# Patient Record
Sex: Male | Born: 1955 | Race: White | Hispanic: No | Marital: Single | Smoking: Never smoker
Health system: Southern US, Community
[De-identification: ages and names within clinical notes are randomized; demographics above are authoritative.]

## PROBLEM LIST (undated history)

## (undated) DIAGNOSIS — E119 Type 2 diabetes mellitus without complications: Secondary | ICD-10-CM

## (undated) DIAGNOSIS — Z87442 Personal history of urinary calculi: Secondary | ICD-10-CM

## (undated) DIAGNOSIS — M549 Dorsalgia, unspecified: Secondary | ICD-10-CM

## (undated) DIAGNOSIS — I10 Essential (primary) hypertension: Secondary | ICD-10-CM

## (undated) DIAGNOSIS — M1990 Unspecified osteoarthritis, unspecified site: Secondary | ICD-10-CM

## (undated) HISTORY — PX: SHOULDER ARTHROSCOPY: SHX128

## (undated) HISTORY — PX: BACK SURGERY: SHX140

## (undated) HISTORY — PX: JOINT REPLACEMENT: SHX530

## (undated) HISTORY — PX: CHOLECYSTECTOMY: SHX55

## (undated) HISTORY — PX: CARPAL TUNNEL RELEASE: SHX101

## (undated) HISTORY — PX: FOOT SURGERY: SHX648

## (undated) HISTORY — PX: TOTAL KNEE ARTHROPLASTY: SHX125

---

## 2006-03-12 DIAGNOSIS — I10 Essential (primary) hypertension: Secondary | ICD-10-CM

## 2006-03-12 DIAGNOSIS — E1165 Type 2 diabetes mellitus with hyperglycemia: Secondary | ICD-10-CM

## 2007-10-27 DIAGNOSIS — E781 Pure hyperglyceridemia: Secondary | ICD-10-CM

## 2008-10-27 DIAGNOSIS — E669 Obesity, unspecified: Secondary | ICD-10-CM | POA: Insufficient documentation

## 2008-11-08 DIAGNOSIS — K7581 Nonalcoholic steatohepatitis (NASH): Secondary | ICD-10-CM

## 2010-02-14 DIAGNOSIS — G8929 Other chronic pain: Secondary | ICD-10-CM | POA: Insufficient documentation

## 2010-04-30 DIAGNOSIS — M109 Gout, unspecified: Secondary | ICD-10-CM | POA: Insufficient documentation

## 2011-02-28 DIAGNOSIS — M5416 Radiculopathy, lumbar region: Secondary | ICD-10-CM | POA: Insufficient documentation

## 2012-01-23 DIAGNOSIS — N200 Calculus of kidney: Secondary | ICD-10-CM | POA: Insufficient documentation

## 2013-01-15 DIAGNOSIS — M009 Pyogenic arthritis, unspecified: Secondary | ICD-10-CM | POA: Insufficient documentation

## 2013-01-22 DIAGNOSIS — M009 Pyogenic arthritis, unspecified: Secondary | ICD-10-CM | POA: Insufficient documentation

## 2013-02-01 DIAGNOSIS — M869 Osteomyelitis, unspecified: Secondary | ICD-10-CM | POA: Insufficient documentation

## 2013-02-07 DIAGNOSIS — M86129 Other acute osteomyelitis, unspecified humerus: Secondary | ICD-10-CM | POA: Insufficient documentation

## 2013-04-22 DIAGNOSIS — K5790 Diverticulosis of intestine, part unspecified, without perforation or abscess without bleeding: Secondary | ICD-10-CM | POA: Insufficient documentation

## 2013-07-05 DIAGNOSIS — IMO0002 Reserved for concepts with insufficient information to code with codable children: Secondary | ICD-10-CM | POA: Insufficient documentation

## 2013-07-05 DIAGNOSIS — L600 Ingrowing nail: Secondary | ICD-10-CM | POA: Insufficient documentation

## 2013-07-16 DIAGNOSIS — G959 Disease of spinal cord, unspecified: Secondary | ICD-10-CM | POA: Insufficient documentation

## 2013-10-18 ENCOUNTER — Emergency Department (HOSPITAL_COMMUNITY)
Admission: EM | Admit: 2013-10-18 | Discharge: 2013-10-18 | Disposition: A | Discharge status: Home / Self Care | Payer: Medicaid - Out of State | Attending: Emergency Medicine | Admitting: Emergency Medicine

## 2013-10-18 ENCOUNTER — Encounter (HOSPITAL_COMMUNITY): Payer: Self-pay | Admitting: Emergency Medicine

## 2013-10-18 DIAGNOSIS — Z96659 Presence of unspecified artificial knee joint: Secondary | ICD-10-CM | POA: Diagnosis not present

## 2013-10-18 DIAGNOSIS — E119 Type 2 diabetes mellitus without complications: Secondary | ICD-10-CM | POA: Diagnosis not present

## 2013-10-18 DIAGNOSIS — Z79899 Other long term (current) drug therapy: Secondary | ICD-10-CM | POA: Insufficient documentation

## 2013-10-18 DIAGNOSIS — I10 Essential (primary) hypertension: Secondary | ICD-10-CM | POA: Diagnosis not present

## 2013-10-18 DIAGNOSIS — Z791 Long term (current) use of non-steroidal anti-inflammatories (NSAID): Secondary | ICD-10-CM | POA: Diagnosis not present

## 2013-10-18 DIAGNOSIS — L739 Follicular disorder, unspecified: Secondary | ICD-10-CM | POA: Diagnosis not present

## 2013-10-18 DIAGNOSIS — Z792 Long term (current) use of antibiotics: Secondary | ICD-10-CM | POA: Insufficient documentation

## 2013-10-18 DIAGNOSIS — Z8614 Personal history of Methicillin resistant Staphylococcus aureus infection: Secondary | ICD-10-CM | POA: Diagnosis not present

## 2013-10-18 DIAGNOSIS — Z794 Long term (current) use of insulin: Secondary | ICD-10-CM | POA: Diagnosis not present

## 2013-10-18 DIAGNOSIS — M79605 Pain in left leg: Secondary | ICD-10-CM | POA: Insufficient documentation

## 2013-10-18 DIAGNOSIS — M79604 Pain in right leg: Secondary | ICD-10-CM | POA: Diagnosis present

## 2013-10-18 HISTORY — DX: Type 2 diabetes mellitus without complications: E11.9

## 2013-10-18 HISTORY — DX: Essential (primary) hypertension: I10

## 2013-10-18 MED ORDER — OXYCODONE-ACETAMINOPHEN 5-325 MG PO TABS: 1.0000 | ORAL_TABLET | ORAL | Status: DC | PRN

## 2013-10-18 MED ORDER — NAPROXEN 500 MG PO TABS: 500.0000 mg | ORAL_TABLET | Freq: Two times a day (BID) | ORAL | Status: DC

## 2013-10-18 MED ORDER — CLINDAMYCIN HCL 150 MG PO CAPS
300.0000 mg | ORAL_CAPSULE | Freq: Once | ORAL | Status: AC
  Administered 2013-10-18: 300 mg via ORAL
  Filled 2013-10-18: qty 2

## 2013-10-18 MED ORDER — CLINDAMYCIN HCL 300 MG PO CAPS: 300.0000 mg | ORAL_CAPSULE | Freq: Four times a day (QID) | ORAL | Status: DC

## 2013-10-18 MED ORDER — OXYCODONE-ACETAMINOPHEN 5-325 MG PO TABS
2.0000 | ORAL_TABLET | Freq: Once | ORAL | Status: AC
  Administered 2013-10-18: 2 via ORAL

## 2013-10-18 NOTE — Discharge Instructions (Signed)
Folliculitis  Folliculitis is redness, soreness, and swelling (inflammation) of the hair follicles. This condition can occur anywhere on the body. People with weakened immune systems, diabetes, or obesity have a greater risk of getting folliculitis. CAUSES  Bacterial infection. This is the most common cause.  Fungal infection.  Viral infection.  Contact with certain chemicals, especially oils and tars. Long-term folliculitis can result from bacteria that live in the nostrils. The bacteria may trigger multiple outbreaks of folliculitis over time. SYMPTOMS Folliculitis most commonly occurs on the scalp, thighs, legs, back, buttocks, and areas where hair is shaved frequently. An early sign of folliculitis is a small, white or yellow, pus-filled, itchy lesion (pustule). These lesions appear on a red, inflamed follicle. They are usually less than 0.2 inches (5 mm) wide. When there is an infection of the follicle that goes deeper, it becomes a boil or furuncle. A group of closely packed boils creates a larger lesion (carbuncle). Carbuncles tend to occur in hairy, sweaty areas of the body. DIAGNOSIS  Your caregiver can usually tell what is wrong by doing a physical exam. A sample may be taken from one of the lesions and tested in a lab. This can help determine what is causing your folliculitis. TREATMENT  Treatment may include:  Applying warm compresses to the affected areas.  Taking antibiotic medicines orally or applying them to the skin.  Draining the lesions if they contain a large amount of pus or fluid.  Laser hair removal for cases of long-lasting folliculitis. This helps to prevent regrowth of the hair. HOME CARE INSTRUCTIONS  Apply warm compresses to the affected areas as directed by your caregiver.  If antibiotics are prescribed, take them as directed. Finish them even if you start to feel better.  You may take over-the-counter medicines to relieve itching.  Do not shave  irritated skin.  Follow up with your caregiver as directed. SEEK IMMEDIATE MEDICAL CARE IF:   You have increasing redness, swelling, or pain in the affected area.  You have a fever. MAKE SURE YOU:  Understand these instructions.  Will watch your condition.  Will get help right away if you are not doing well or get worse. Document Released: 03/11/2001 Document Revised: 07/02/2011 Document Reviewed: 04/02/2011 [facility] Patient Information 2015 [place], [place]. This information is not intended to replace advice given to you by your health care provider. Make sure you discuss any questions you have with your health care provider.

## 2013-10-18 NOTE — ED Provider Notes (Signed)
CSN: [id number]     Arrival date & time 10/18/13  1619 History  This chart was scribed for non-physician practitioner, Evalee Jefferson, PA-C,working with Hoy Morn, MD, by Marlowe Kays, ED Scribe. This patient was seen in room APFT20/APFT20 and the patient's care was started at 5:45 PM.  Chief Complaint  Patient presents with  . Leg Pain   The history is provided by the patient. No language interpreter was used.   HPI Comments:  [name] is a 58 y.o. male with PMHx of DM and HTN who presents to the Emergency Department complaining of multiple red areas on bilateral lower extremities that began within the past two weeks. Pt reports associated moderate pain and drainage from the areas of a whitish-pink color because he reports he has been "digging them open". He reports he began taking Zanaflex and Topamax two weeks ago prescribed by Dr. Stephens Shire. He states he had a subjective fever for one day about one week ago. Touching the areas make the pain worse. He denies any alleviating factors. He denies any recent fever, chills, nausea, vomiting. He states his CBGs have been fine. He denies any new tattoos. He states he has a PCP in [place], [place]. He reports past surgeries of joint replacement, back, knee and foot surgery, cholecystectomy, and carpal tunnel release.  Past Medical History  Diagnosis Date  . Diabetes mellitus without complication   . Hypertension    Past Surgical History  Procedure Laterality Date  . Joint replacement    . Back surgery    . Cholecystectomy    . Carpal tunnel release    . Foot surgery    . Total knee arthroplasty     History reviewed. No pertinent family history. History  Substance Use Topics  . Smoking status: Never Smoker   . Smokeless tobacco: Not on file  . Alcohol Use: No    Review of Systems  Constitutional: Negative for chills.  Respiratory: Negative for shortness of breath and wheezing.   Gastrointestinal: Negative for nausea and  vomiting.  Skin: Positive for rash.  Neurological: Negative for numbness.    Allergies  Toradol and Ultram  Home Medications   Prior to Admission medications   Medication Sig Start Date End Date Taking? Authorizing Provider  Benzocaine (BOIL PAIN RELIEF EX) Apply 1 application topically as needed (for sores on legs).   Yes Historical Provider, MD  hydrochlorothiazide (HYDRODIURIL) 25 MG tablet Take 25 mg by mouth daily. 09/02/13  Yes Historical Provider, MD  LEVEMIR FLEXTOUCH 100 UNIT/ML Pen Inject 80 Units into the skin 2 (two) times daily. 10/08/13  Yes Historical Provider, MD  lisinopril (PRINIVIL,ZESTRIL) 40 MG tablet Take 40 mg by mouth daily. 09/02/13  Yes Historical Provider, MD  metoprolol (LOPRESSOR) 100 MG tablet Take 100 mg by mouth 2 (two) times daily. 09/02/13  Yes Historical Provider, MD  Neomycin-Bacitracin-Polymyxin (TRIPLE ANTIBIOTIC) [phone] OINT Apply 1 application topically as needed (for sores and skin irritation to legs).   Yes Historical Provider, MD  tiZANidine (ZANAFLEX) 4 MG tablet Take 4 mg by mouth 3 (three) times daily. 10/06/13  Yes Historical Provider, MD  topiramate (TOPAMAX) 25 MG capsule Take 25 mg by mouth 2 (two) times daily. 10/06/13  Yes Historical Provider, MD  clindamycin (CLEOCIN) 300 MG capsule Take 1 capsule (300 mg total) by mouth 4 (four) times daily. 10/18/13   Evalee Jefferson, PA-C  HUMULIN R 100 UNIT/ML injection Inject into the skin 3 (three) times daily with meals as needed.  Based on sliding scale 10/15/13   Historical Provider, MD  naproxen (NAPROSYN) 500 MG tablet Take 1 tablet (500 mg total) by mouth 2 (two) times daily with a meal. 10/18/13   Evalee Jefferson, PA-C  oxyCODONE-acetaminophen (PERCOCET/ROXICET) 5-325 MG per tablet Take 1 tablet by mouth every 4 (four) hours as needed. 10/18/13   Evalee Jefferson, PA-C   Triage Vitals: BP 121/74  Pulse 69  Temp(Src) 98.7 F (37.1 C) (Oral)  Resp 18  Ht 6' 3"  (1.905 m)  Wt 210 lb (95.255 kg)  BMI 26.25 kg/m2   SpO2 100% Physical Exam  Nursing note and vitals reviewed. Constitutional: He appears well-developed and well-nourished.  HENT:  Head: Normocephalic and atraumatic.  Eyes: Conjunctivae are normal.  Neck: Normal range of motion.  Cardiovascular: Normal rate, regular rhythm, normal heart sounds and intact distal pulses.   Dorsalis Pedal pulses intact. Cap refill less than 2 seconds.   Pulmonary/Chest: Effort normal and breath sounds normal. He has no wheezes.  Abdominal: Soft. Bowel sounds are normal. There is no tenderness.  Musculoskeletal: Normal range of motion.  Neurological: He is alert.  Normal sensations in feet bilaterally.  Skin: Skin is warm and dry. Rash noted.  Multiple raised papules on lower extremities, right greater than left. Indurated without fluctuance or spontaneous drainage. No red streaking. Largest one measures approximately 1 cm.  Psychiatric: He has a normal mood and affect.    ED Course  Procedures (including critical care time) DIAGNOSTIC STUDIES: Oxygen Saturation is 100% on RA, normal by my interpretation.   COORDINATION OF CARE: 5:58 PM- Will have Dr. Venora Maples evaluate patient. Will prescribe Clindamycin and advised pt to follow up with PCP. Advised pt to apply warm compresses to sites. Pt verbalizes understanding and agrees to plan.  Medications  clindamycin (CLEOCIN) capsule 300 mg (300 mg Oral Given 10/18/13 1817)  oxyCODONE-acetaminophen (PERCOCET/ROXICET) 5-325 MG per tablet 2 tablet (2 tablets Oral Given 10/18/13 1817)    Labs Review Labs Reviewed - No data to display  Imaging Review No results found.   EKG Interpretation None      MDM   Final diagnoses:  Folliculitis    Pt placed on clindamycin and naproxen.  He was given small oxycodone script for the first 24 hours, then naproxen prn for pain.  Warm compresses.  F/u with pcp ([facility] in [place]) if sx worsen or are not improving with tx.  Pt describes severe left shoulder post  surgical infection 12/14 requiring home IV abx, h/o mrsa.  Will cover for mrsa today with clindamycin.  I personally performed the services described in this documentation, which was scribed in my presence. The recorded information has been reviewed and is accurate.    Evalee Jefferson, PA-C 10/20/13 1256

## 2013-10-18 NOTE — ED Notes (Signed)
Pt has mult red areas on lower legs, Pt thinks is due to taking zanaflex.  Areas are "sore" and red

## 2013-10-20 NOTE — ED Provider Notes (Signed)
Medical screening examination/treatment/procedure(s) were performed by non-physician practitioner and as supervising physician I was immediately available for consultation/collaboration.   EKG Interpretation None        Hoy Morn, MD 10/20/13 [id number]

## 2013-10-31 ENCOUNTER — Emergency Department (HOSPITAL_COMMUNITY)
Admission: EM | Admit: 2013-10-31 | Discharge: 2013-10-31 | Disposition: A | Discharge status: Home / Self Care | Payer: Medicaid - Out of State | Attending: Emergency Medicine | Admitting: Emergency Medicine

## 2013-10-31 ENCOUNTER — Encounter (HOSPITAL_COMMUNITY): Payer: Self-pay | Admitting: Emergency Medicine

## 2013-10-31 DIAGNOSIS — Z791 Long term (current) use of non-steroidal anti-inflammatories (NSAID): Secondary | ICD-10-CM | POA: Diagnosis not present

## 2013-10-31 DIAGNOSIS — I10 Essential (primary) hypertension: Secondary | ICD-10-CM | POA: Insufficient documentation

## 2013-10-31 DIAGNOSIS — L02415 Cutaneous abscess of right lower limb: Secondary | ICD-10-CM

## 2013-10-31 DIAGNOSIS — M1990 Unspecified osteoarthritis, unspecified site: Secondary | ICD-10-CM | POA: Insufficient documentation

## 2013-10-31 DIAGNOSIS — L02416 Cutaneous abscess of left lower limb: Secondary | ICD-10-CM | POA: Diagnosis not present

## 2013-10-31 DIAGNOSIS — Z794 Long term (current) use of insulin: Secondary | ICD-10-CM | POA: Insufficient documentation

## 2013-10-31 DIAGNOSIS — E1165 Type 2 diabetes mellitus with hyperglycemia: Secondary | ICD-10-CM

## 2013-10-31 DIAGNOSIS — R197 Diarrhea, unspecified: Secondary | ICD-10-CM | POA: Diagnosis not present

## 2013-10-31 DIAGNOSIS — L03115 Cellulitis of right lower limb: Secondary | ICD-10-CM | POA: Diagnosis present

## 2013-10-31 DIAGNOSIS — Z792 Long term (current) use of antibiotics: Secondary | ICD-10-CM | POA: Insufficient documentation

## 2013-10-31 DIAGNOSIS — E669 Obesity, unspecified: Secondary | ICD-10-CM

## 2013-10-31 DIAGNOSIS — E1169 Type 2 diabetes mellitus with other specified complication: Secondary | ICD-10-CM

## 2013-10-31 HISTORY — DX: Unspecified osteoarthritis, unspecified site: M19.90

## 2013-10-31 LAB — COMPREHENSIVE METABOLIC PANEL
ALT: 18 U/L (ref 0–53)
ANION GAP: 11 (ref 5–15)
AST: 25 U/L (ref 0–37)
Albumin: 3.7 g/dL (ref 3.5–5.2)
Alkaline Phosphatase: 77 U/L (ref 39–117)
BILIRUBIN TOTAL: 0.4 mg/dL (ref 0.3–1.2)
BUN: 16 mg/dL (ref 6–23)
CO2: 26 mEq/L (ref 19–32)
Calcium: 9.3 mg/dL (ref 8.4–10.5)
Chloride: 100 mEq/L (ref 96–112)
Creatinine, Ser: 0.96 mg/dL (ref 0.50–1.35)
GFR calc Af Amer: >90 mL/min (ref >90)
GFR calc non Af Amer: >90 mL/min (ref >90)
GLUCOSE: 300 mg/dL — AB (ref 70–99)
Potassium: 4.8 mEq/L (ref 3.7–5.3)
Sodium: 137 mEq/L (ref 137–147)
Total Protein: 7 g/dL (ref 6.0–8.3)

## 2013-10-31 LAB — CBC WITH DIFFERENTIAL/PLATELET
Basophils Absolute: 0 10*3/uL (ref 0.0–0.1)
Basophils Relative: 0 % (ref 0–1)
Eosinophils Absolute: 0.1 10*3/uL (ref 0.0–0.7)
Eosinophils Relative: 2 % (ref 0–5)
HCT: 34.2 % — ABNORMAL LOW (ref 39.0–52.0)
Hemoglobin: 12.4 g/dL — ABNORMAL LOW (ref 13.0–17.0)
LYMPHS ABS: 2 10*3/uL (ref 0.7–4.0)
LYMPHS PCT: 41 % (ref 12–46)
MCH: 32.4 pg (ref 26.0–34.0)
MCHC: 36.3 g/dL — ABNORMAL HIGH (ref 30.0–36.0)
MCV: 89.3 fL (ref 78.0–100.0)
MONOS PCT: 8 % (ref 3–12)
Monocytes Absolute: 0.4 10*3/uL (ref 0.1–1.0)
NEUTROS PCT: 49 % (ref 43–77)
Neutro Abs: 2.4 10*3/uL (ref 1.7–7.7)
Platelets: 122 10*3/uL — ABNORMAL LOW (ref 150–400)
RBC: 3.83 MIL/uL — AB (ref 4.22–5.81)
RDW: 13.1 % (ref 11.5–15.5)
WBC: 4.8 10*3/uL (ref 4.0–10.5)

## 2013-10-31 LAB — CBG MONITORING, ED: Glucose-Capillary: 329 mg/dL — ABNORMAL HIGH (ref 70–99)

## 2013-10-31 MED ORDER — BACITRACIN-NEOMYCIN-POLYMYXIN 400-5-5000 EX OINT
TOPICAL_OINTMENT | Freq: Once | CUTANEOUS | Status: AC
  Administered 2013-10-31: 1 via TOPICAL
  Filled 2013-10-31: qty 3

## 2013-10-31 MED ORDER — HYDROCODONE-ACETAMINOPHEN 5-325 MG PO TABS
2.0000 | ORAL_TABLET | Freq: Once | ORAL | Status: AC
Start: 2013-10-31 — End: 2013-10-31
  Administered 2013-10-31: 2 via ORAL

## 2013-10-31 MED ORDER — LIDOCAINE HCL (PF) 1 % IJ SOLN
30.0000 mL | Freq: Once | INTRAMUSCULAR | Status: AC
  Administered 2013-10-31: 30 mL via INTRADERMAL
  Filled 2013-10-31: qty 30

## 2013-10-31 MED ORDER — HYDROCODONE-ACETAMINOPHEN 5-325 MG PO TABS
ORAL_TABLET | ORAL | Status: AC
  Filled 2013-10-31: qty 2

## 2013-10-31 MED ORDER — SULFAMETHOXAZOLE-TRIMETHOPRIM 800-160 MG PO TABS: 1.0000 | ORAL_TABLET | Freq: Two times a day (BID) | ORAL | Status: DC

## 2013-10-31 NOTE — ED Notes (Signed)
Multiple abscess noted to both lower extremities all about quarter size.

## 2013-10-31 NOTE — ED Notes (Signed)
MD at bedside. 

## 2013-10-31 NOTE — ED Notes (Signed)
Pt says he is out of medications.  C/o pain to both legs, rates pain 10.

## 2013-10-31 NOTE — ED Provider Notes (Signed)
CSN: [id number]     Arrival date & time 10/31/13  1618 History   This chart was scribed for Darrell Clonts, MD by Molli Posey, ED Scribe. This patient was seen in room APA18/APA18 and the patient's care was started 5:14 PM.    Chief Complaint  Patient presents with  . Cellulitis     The history is provided by the patient. No language interpreter was used.   HPI Comments: [name] is a 58 y.o. male with a history of DM and HTN who presents to the Emergency Department complaining of gradually worsening cellulitis that started 3 weeks ago when he shaved his legs to get a tattoo. Patient reports he was seen in ED a week ago and completed his clindamycin prescription 3 days ago. He reports his symptoms began to improve and then started worsening the past week. He reports associated redness, swelling, subjective fevers, chills and diarrhea. Patient reports he was hospitalized for a staph infection in 2014.     Past Medical History  Diagnosis Date  . Diabetes mellitus without complication   . Hypertension   . Arthritis    Past Surgical History  Procedure Laterality Date  . Joint replacement    . Back surgery    . Cholecystectomy    . Carpal tunnel release    . Foot surgery    . Total knee arthroplasty     No family history on file. History  Substance Use Topics  . Smoking status: Never Smoker   . Smokeless tobacco: Not on file  . Alcohol Use: No    Review of Systems  Constitutional: Positive for fever.  Cardiovascular: Negative for chest pain.  Gastrointestinal: Positive for diarrhea. Negative for abdominal pain.  Skin: Positive for rash.  All other systems reviewed and are negative.    Allergies  Toradol and Ultram  Home Medications   Prior to Admission medications   Medication Sig Start Date End Date Taking? Authorizing Provider  HUMULIN R 100 UNIT/ML injection Inject into the skin 3 (three) times daily with meals as needed. Based on sliding scale 10/15/13   Yes Historical Provider, MD  hydrochlorothiazide (HYDRODIURIL) 25 MG tablet Take 25 mg by mouth daily. 09/02/13  Yes Historical Provider, MD  ibuprofen (ADVIL,MOTRIN) 200 MG tablet Take 200 mg by mouth every 6 (six) hours as needed for mild pain or moderate pain.   Yes Historical Provider, MD  LEVEMIR FLEXTOUCH 100 UNIT/ML Pen Inject 80 Units into the skin 2 (two) times daily. 10/08/13  Yes Historical Provider, MD  lisinopril (PRINIVIL,ZESTRIL) 40 MG tablet Take 40 mg by mouth daily. 09/02/13  Yes Historical Provider, MD  metoprolol (LOPRESSOR) 100 MG tablet Take 100 mg by mouth 2 (two) times daily. 09/02/13  Yes Historical Provider, MD  topiramate (TOPAMAX) 25 MG capsule Take 25 mg by mouth 2 (two) times daily. 10/06/13  Yes Historical Provider, MD  clindamycin (CLEOCIN) 300 MG capsule Take 1 capsule (300 mg total) by mouth 4 (four) times daily. 10/18/13   Evalee Jefferson, PA-C  naproxen (NAPROSYN) 500 MG tablet Take 1 tablet (500 mg total) by mouth 2 (two) times daily with a meal. 10/18/13   Evalee Jefferson, PA-C  oxyCODONE-acetaminophen (PERCOCET/ROXICET) 5-325 MG per tablet Take 1 tablet by mouth every 4 (four) hours as needed. 10/18/13   Evalee Jefferson, PA-C  sulfamethoxazole-trimethoprim (SEPTRA DS) 800-160 MG per tablet Take 1 tablet by mouth 2 (two) times daily. 10/31/13   Darrell Clonts, MD   BP 148/86  Pulse 95  Temp(Src) 98.2 F (36.8 C) (Oral)  Resp 20  Ht 6' 3"  (1.905 m)  Wt 210 lb (95.255 kg)  BMI 26.25 kg/m2  SpO2 97% Physical Exam  Constitutional: He is oriented to person, place, and time. He appears well-developed and well-nourished.  HENT:  Head: Normocephalic and atraumatic.  Mouth/Throat: Oropharynx is clear and moist.  Eyes: EOM are normal. Pupils are equal, round, and reactive to light.  Neck: Normal range of motion.  Cardiovascular: Normal rate, regular rhythm and normal heart sounds.   Pulmonary/Chest: Effort normal.  Musculoskeletal: Normal range of motion.  Neurological: He is  alert and oriented to person, place, and time.  Skin: Skin is warm and dry.  Multiple abbesses in the lower extremities One on lateral left leg, calf left leg, lateral right leg, 5 on the medial right leg  All about 1-2cm    Psychiatric: He has a normal mood and affect.    ED Course  Procedures  EMERGENCY DEPARTMENT US SOFT TISSUE INTERPRETATION "Study: Limited Soft Tissue Ultrasound"  INDICATIONS: Pain and Soft tissue infection Multiple views of the body part were obtained in real-time with a multi-frequency linear probe PERFORMED BY:  Myself IMAGES ARCHIVED?: Yes SIDE:Right  BODY PART:Lower extremity FINDINGS: Abcess present and Cellulitis absent INTERPRETATION:  Abcess present and No cellulitis noted early phlegmon  EMERGENCY DEPARTMENT US SOFT TISSUE INTERPRETATION "Study: Limited Soft Tissue Ultrasound"  INDICATIONS: Pain and Soft tissue infection Multiple views of the body part were obtained in real-time with a multi-frequency linear probe PERFORMED BY:  Myself IMAGES ARCHIVED?: Yes SIDE:Left BODY PART:Lower extremity FINDINGS: Abcess present and Cellulitis absent INTERPRETATION:  Abcess present early phlegmon  INCISION AND DRAINAGE Performed by: [name] Consent: Verbal consent obtained. Risks and benefits: risks, benefits and alternatives were discussed Type: abscess  Body area: right leg  Anesthesia: local infiltration Incision was made with a scalpel. Local anesthetic: lidocaine Anesthetic total: 4 ml Complexity: simple Blunt dissection to break up loculations Drainage: minimal  Patient tolerance: Patient tolerated the procedure well with no immediate complications.   INCISION AND DRAINAGE Performed by: [name] Consent: Verbal consent obtained. Risks and benefits: risks, benefits and alternatives were discussed Type: abscess  Body area: left leg Anesthesia: local infiltration Incision was made with a scalpel. Local anesthetic:  lidocaine Anesthetic total: 3 ml Complexity: simple Blunt dissection to break up loculations Drainage: minimal  INCISION AND DRAINAGE Performed by: [name] Consent: Verbal consent obtained. Risks and benefits: risks, benefits and alternatives were discussed Type: abscess  Body area: left calf Anesthesia: local infiltration Incision was made with a scalpel. Local anesthetic: lidocaine Anesthetic total: 3 ml Complexity: simple Blunt dissection to break up loculations Drainage: minimal  Patient tolerance: Patient tolerated the procedure well with no immediate complications.     Patient tolerance: Patient tolerated the procedure well with no immediate complications.       DIAGNOSTIC STUDIES: Oxygen Saturation is 97% on RA, normal by my interpretation.    COORDINATION OF CARE: 5:20 PM Discussed treatment plan with pt at bedside and pt agreed to plan.   Labs Review Labs Reviewed  CBC WITH DIFFERENTIAL - Abnormal; Notable for the following:    RBC 3.83 (*)    Hemoglobin 12.4 (*)    HCT 34.2 (*)    MCHC 36.3 (*)    Platelets 122 (*)    All other components within normal limits  COMPREHENSIVE METABOLIC PANEL - Abnormal; Notable for the following:  Glucose, Bld 300 (*)    All other components within normal limits  CBG MONITORING, ED - Abnormal; Notable for the following:    Glucose-Capillary 329 (*)    All other components within normal limits    Imaging Review No results found.   EKG Interpretation None      MDM   Final diagnoses:  Multiple abscesses of both legs  Diabetes mellitus type 2 in obese  Hyperglycemia due to type 2 diabetes mellitus   I personally performed the services described in this documentation, which was scribed in my presence. The recorded information has been reviewed and is accurate.  Patient with unremarkable vitals presents with multiple skin abscesses. Mild cellulitis surrounding a few of them. No fever, no white  blood cell count elevation, elevated glucose discussed with patient and he recently had an unhealthy meal right before he arrived and will work on improving his glucose.  Discussed regular soaks, chlorhexidine wash, antibiotics and outpatient followup. 3 small phlegmon/abscess ease open in ER.  Results and differential diagnosis were discussed with the patient/parent/guardian. Close follow up outpatient was discussed, comfortable with the plan.   Medications  neomycin-bacitracin-polymyxin (NEOSPORIN) ointment (not administered)  lidocaine (PF) (XYLOCAINE) 1 % injection 30 mL (30 mLs Intradermal Given 10/31/13 1812)    Filed Vitals:   10/31/13 1630  BP: 148/86  Pulse: 95  Temp: 98.2 F (36.8 C)  TempSrc: Oral  Resp: 20  Height: 6' 3"  (1.905 m)  Weight: 210 lb (95.255 kg)  SpO2: 97%    Final diagnoses:  Multiple abscesses of both legs  Diabetes mellitus type 2 in obese  Hyperglycemia due to type 2 diabetes mellitus       Darrell Clonts, MD 10/31/13 1850

## 2013-10-31 NOTE — ED Notes (Signed)
CBG 329

## 2013-10-31 NOTE — ED Notes (Signed)
PT reported being seen in ED a week ago and completed an oral antibiotic therapy for abscess to on bilateral lower legs. PT presents to ED today with bilateral lower extremity redness/swelling and multiple reddened areas.

## 2013-10-31 NOTE — ED Notes (Signed)
MD at bedside.  In process of culturing wounds.  Family at bedside.  Plan to d/cd home following procedure.

## 2013-10-31 NOTE — Discharge Instructions (Signed)
Purchase chlorhexadine wash as discussed. Take antibiotics and soak twice daily in tub or shower.  If you were given medicines take as directed.  If you are on coumadin or contraceptives realize their levels and effectiveness is altered by many different medicines.  If you have any reaction (rash, tongues swelling, other) to the medicines stop taking and see a physician.   Please follow up as directed and return to the ER or see a physician for new or worsening symptoms.  Thank you. Filed Vitals:   10/31/13 1630  BP: 148/86  Pulse: 95  Temp: 98.2 F (36.8 C)  TempSrc: Oral  Resp: 20  Height: 6' 3"  (1.905 m)  Weight: 210 lb (95.255 kg)  SpO2: 97%    Abscess An abscess (boil or furuncle) is an infected area on or under the skin. This area is filled with yellowish-white fluid (pus) and other material (debris). HOME CARE   Only take medicines as told by your doctor.  If you were given antibiotic medicine, take it as directed. Finish the medicine even if you start to feel better.  If gauze is used, follow your doctor's directions for changing the gauze.  To avoid spreading the infection:  Keep your abscess covered with a bandage.  Wash your hands well.  Do not share personal care items, towels, or whirlpools with others.  Avoid skin contact with others.  Keep your skin and clothes clean around the abscess.  Keep all doctor visits as told. GET HELP RIGHT AWAY IF:   You have more pain, puffiness (swelling), or redness in the wound site.  You have more fluid or blood coming from the wound site.  You have muscle aches, chills, or you feel sick.  You have a fever. MAKE SURE YOU:   Understand these instructions.  Will watch your condition.  Will get help right away if you are not doing well or get worse. Document Released: 06/19/2007 Document Revised: 07/02/2011 Document Reviewed: 03/15/2011 [facility] Patient Information 2015 [place], [place]. This information is not  intended to replace advice given to you by your health care provider. Make sure you discuss any questions you have with your health care provider.

## 2013-12-05 ENCOUNTER — Emergency Department (HOSPITAL_COMMUNITY)
Admission: EM | Admit: 2013-12-05 | Discharge: 2013-12-05 | Disposition: A | Discharge status: Home / Self Care | Payer: Medicaid - Out of State | Attending: Emergency Medicine | Admitting: Emergency Medicine

## 2013-12-05 ENCOUNTER — Encounter (HOSPITAL_COMMUNITY): Payer: Self-pay | Admitting: Emergency Medicine

## 2013-12-05 ENCOUNTER — Emergency Department (HOSPITAL_COMMUNITY): Payer: Medicaid - Out of State

## 2013-12-05 DIAGNOSIS — G8921 Chronic pain due to trauma: Secondary | ICD-10-CM | POA: Insufficient documentation

## 2013-12-05 DIAGNOSIS — M25511 Pain in right shoulder: Secondary | ICD-10-CM | POA: Diagnosis present

## 2013-12-05 DIAGNOSIS — M25512 Pain in left shoulder: Secondary | ICD-10-CM | POA: Diagnosis not present

## 2013-12-05 DIAGNOSIS — Z794 Long term (current) use of insulin: Secondary | ICD-10-CM | POA: Diagnosis not present

## 2013-12-05 DIAGNOSIS — M1990 Unspecified osteoarthritis, unspecified site: Secondary | ICD-10-CM | POA: Diagnosis not present

## 2013-12-05 DIAGNOSIS — Z79899 Other long term (current) drug therapy: Secondary | ICD-10-CM | POA: Insufficient documentation

## 2013-12-05 DIAGNOSIS — E119 Type 2 diabetes mellitus without complications: Secondary | ICD-10-CM | POA: Insufficient documentation

## 2013-12-05 DIAGNOSIS — I10 Essential (primary) hypertension: Secondary | ICD-10-CM | POA: Insufficient documentation

## 2013-12-05 MED ORDER — OXYCODONE-ACETAMINOPHEN 5-325 MG PO TABS
1.0000 | ORAL_TABLET | Freq: Once | ORAL | Status: AC
  Administered 2013-12-05: 1 via ORAL
  Filled 2013-12-05: qty 1

## 2013-12-05 MED ORDER — OXYCODONE-ACETAMINOPHEN 5-325 MG PO TABS: 1.0000 | ORAL_TABLET | ORAL | Status: DC | PRN

## 2013-12-05 MED ORDER — NAPROXEN 500 MG PO TABS: 500.0000 mg | ORAL_TABLET | Freq: Two times a day (BID) | ORAL | Status: DC

## 2013-12-05 NOTE — ED Notes (Signed)
Patient C/O bilateral shoulder pain with limited ROM. Per patient had surgery in left shoulder last year-Dec 24 2012 in which he got serious infection in due to doctor leaving a "bit" in his shoulder. Patient reports falling and hitting right shoulder ion September of this year in which he was originally told had a hairline fracture but when he returned the hospital told him it was not. Patient reports taking tylenol and aleve with no relief. Patient also reports taking Zanaflex with no relief.[name]

## 2013-12-05 NOTE — Discharge Instructions (Signed)
Shoulder Pain The shoulder is the joint that connects your arm to your body. Muscles and band-like tissues that connect bones to muscles (tendons) hold the joint together. Shoulder pain is felt if an injury or medical problem affects one or more parts of the shoulder. HOME CARE   Put ice on the sore area.  Put ice in a plastic bag.  Place a towel between your skin and the bag.  Leave the ice on for 15-20 minutes, 03-04 times a day for the first 2 days.  Stop using cold packs if they do not help with the pain.  If you were given something to keep your shoulder from moving (sling; shoulder immobilizer), wear it as told. Only take it off to shower or bathe.  Move your arm as little as possible, but keep your hand moving to prevent puffiness (swelling).  Squeeze a soft ball or foam pad as much as possible to help prevent swelling.  Take medicine as told by your doctor. GET HELP IF:  You have progressing new pain in your arm, hand, or fingers.  Your hand or fingers get cold.  Your medicine does not help lessen your pain. GET HELP RIGHT AWAY IF:   Your arm, hand, or fingers are numb or tingling.  Your arm, hand, or fingers are puffy (swollen), painful, or turn white or blue. MAKE SURE YOU:   Understand these instructions.  Will watch your condition.  Will get help right away if you are not doing well or get worse. Document Released: 06/19/2007 Document Revised: 05/17/2013 Document Reviewed: 07/15/2011 [facility] Patient Information 2015 [place], [place]. This information is not intended to replace advice given to you by your health care provider. Make sure you discuss any questions you have with your health care provider.

## 2013-12-05 NOTE — ED Notes (Signed)
Pt verbalized understanding of no driving and to use caution within 4 hours of taking pain meds due to meds cause drowsiness 

## 2013-12-07 NOTE — ED Provider Notes (Signed)
CSN: [id number]     Arrival date & time 12/05/13  1010 History   First MD Initiated Contact with Patient 12/05/13 1042     Chief Complaint  Patient presents with  . Shoulder Pain     (Consider location/radiation/quality/duration/timing/severity/associated sxs/prior Treatment) HPI  [name] is a 58 y.o. male who presents to the Emergency Department complaining of right shoulder pain since September when he reports falling onto the right arm.  He states that he had x rays of the shoulder at another facility and was told initially he had a fracture and was rechecked within 1-2 weeks later and told he did not have a fracture.  He reported continued pain with movement of the right arm.  He states that he also has pain to the left shoulder for "years" that he contributes to a post-op infection that developed and states he currently has a law suit pending for in another state.  He denies swelling, redness of the joint, fever, chills, numbness or weakness of the arm or neck pain.    Past Medical History  Diagnosis Date  . Diabetes mellitus without complication   . Hypertension   . Arthritis    Past Surgical History  Procedure Laterality Date  . Joint replacement    . Back surgery    . Cholecystectomy    . Carpal tunnel release    . Foot surgery    . Total knee arthroplasty    . Shoulder arthroscopy     Family History  Problem Relation Age of Onset  . Heart failure Mother   . Cancer Father    History  Substance Use Topics  . Smoking status: Never Smoker   . Smokeless tobacco: Never Used  . Alcohol Use: No    Review of Systems  Constitutional: Negative for fever and chills.  Cardiovascular: Negative for chest pain.  Genitourinary: Negative for dysuria and difficulty urinating.  Musculoskeletal: Positive for arthralgias. Negative for joint swelling, neck pain and neck stiffness.       Pain to right shoulder  Skin: Negative for color change, rash and wound.  Neurological:  Negative for dizziness, weakness, numbness and headaches.  All other systems reviewed and are negative.     Allergies  Toradol and Ultram  Home Medications   Prior to Admission medications   Medication Sig Start Date End Date Taking? Authorizing Provider  amLODipine (NORVASC) 10 MG tablet Take 10 mg by mouth daily.   Yes Historical Provider, MD  HUMULIN R 100 UNIT/ML injection Inject 5-20 Units into the skin 3 (three) times daily with meals as needed. Based on sliding scale 10/15/13  Yes Historical Provider, MD  hydrochlorothiazide (HYDRODIURIL) 25 MG tablet Take 25 mg by mouth daily. 09/02/13  Yes Historical Provider, MD  ibuprofen (ADVIL,MOTRIN) 200 MG tablet Take 200 mg by mouth every 6 (six) hours as needed for mild pain or moderate pain.   Yes Historical Provider, MD  LEVEMIR FLEXTOUCH 100 UNIT/ML Pen Inject 80 Units into the skin 2 (two) times daily. 10/08/13  Yes Historical Provider, MD  lisinopril (PRINIVIL,ZESTRIL) 40 MG tablet Take 40 mg by mouth daily. 09/02/13  Yes Historical Provider, MD  metoprolol (LOPRESSOR) 100 MG tablet Take 100 mg by mouth 2 (two) times daily. 09/02/13  Yes Historical Provider, MD  rosuvastatin (CRESTOR) 10 MG tablet Take 10 mg by mouth daily.   Yes Historical Provider, MD  tiZANidine (ZANAFLEX) 4 MG tablet Take 4 mg by mouth every 8 (eight) hours as needed for  muscle spasms.   Yes Historical Provider, MD  topiramate (TOPAMAX) 25 MG capsule Take 25 mg by mouth 2 (two) times daily. 10/06/13  Yes Historical Provider, MD  naproxen (NAPROSYN) 500 MG tablet Take 1 tablet (500 mg total) by mouth 2 (two) times daily with a meal. 12/05/13   Adhira Jamil L. Lezley Bedgood, PA-C  oxyCODONE-acetaminophen (PERCOCET/ROXICET) 5-325 MG per tablet Take 1 tablet by mouth every 4 (four) hours as needed. 12/05/13   Aryanah Enslow L. Lamekia Nolden, PA-C   BP 184/89 mmHg  Pulse 103  Temp(Src) 97.7 F (36.5 C) (Oral)  Resp 18  Ht 6' 3"  (1.905 m)  Wt 212 lb (96.163 kg)  BMI 26.50 kg/m2  SpO2  97% Physical Exam  Constitutional: He is oriented to person, place, and time. He appears well-developed and well-nourished. No distress.  HENT:  Head: Normocephalic and atraumatic.  Neck: Normal range of motion. Neck supple. No thyromegaly present.  Cardiovascular: Normal rate, regular rhythm, normal heart sounds and intact distal pulses.   No murmur heard. Pulmonary/Chest: Effort normal and breath sounds normal. No respiratory distress. He exhibits no tenderness.  Musculoskeletal: He exhibits tenderness. He exhibits no edema.  Anterior and lateral ttp of the right shoulder.  Pain with abduction of the right arm.  Radial pulse is brisk, distal sensation intact, CR< 2 sec. Grip strength is strong and symmetrical.   No abrasions, edema , erythema or step-off deformity of the joint.   Lymphadenopathy:    He has no cervical adenopathy.  Neurological: He is alert and oriented to person, place, and time. He has normal strength. No sensory deficit. He exhibits normal muscle tone. Coordination normal.  Skin: Skin is warm and dry.  Nursing note and vitals reviewed.   ED Course  Procedures (including critical care time) Labs Review Labs Reviewed - No data to display  Imaging Review Dg Shoulder Right  12/05/2013   CLINICAL DATA:  Fall in September 2015. Right shoulder pain since. Initial encounter.  EXAM: RIGHT SHOULDER - 2+ VIEW  COMPARISON:  None.  FINDINGS: There are degenerative changes in the [facility] joint. Subacromial spurring noted. There is also spurring at the greater tuberosity at the rotator cuff insertion on the humeral head. No fracture, subluxation or dislocation.  IMPRESSION: No acute bony abnormality.   Electronically Signed   By: Rolm Baptise M.D.   On: 12/05/2013 12:47      EKG Interpretation None      MDM   Final diagnoses:  Chronic pain due to injury  Shoulder pain, right    Pt with reported extensive orthopedic problems and bilateral shoulder pain.  Pain through ROM of  the right shoulder.  Reported hx of fx to the shoulder, but no acute fx's on XR today.  NV intact.  Pt given referral info for local orthopedics.      Michel Eskelson L. Van[place], PA-C 12/07/13 2118  Janice Norrie, MD 12/13/13 [phone]

## 2014-01-12 ENCOUNTER — Emergency Department (HOSPITAL_COMMUNITY): Payer: Medicaid - Out of State

## 2014-01-12 ENCOUNTER — Emergency Department (HOSPITAL_COMMUNITY)
Admission: EM | Admit: 2014-01-12 | Discharge: 2014-01-12 | Disposition: A | Discharge status: Home / Self Care | Payer: Medicaid - Out of State | Attending: Emergency Medicine | Admitting: Emergency Medicine

## 2014-01-12 ENCOUNTER — Encounter (HOSPITAL_COMMUNITY): Payer: Self-pay | Admitting: Emergency Medicine

## 2014-01-12 DIAGNOSIS — IMO0001 Reserved for inherently not codable concepts without codable children: Secondary | ICD-10-CM

## 2014-01-12 DIAGNOSIS — S4992XA Unspecified injury of left shoulder and upper arm, initial encounter: Secondary | ICD-10-CM | POA: Diagnosis present

## 2014-01-12 DIAGNOSIS — Z79899 Other long term (current) drug therapy: Secondary | ICD-10-CM | POA: Diagnosis not present

## 2014-01-12 DIAGNOSIS — X58XXXA Exposure to other specified factors, initial encounter: Secondary | ICD-10-CM | POA: Diagnosis not present

## 2014-01-12 DIAGNOSIS — Z791 Long term (current) use of non-steroidal anti-inflammatories (NSAID): Secondary | ICD-10-CM | POA: Diagnosis not present

## 2014-01-12 DIAGNOSIS — I10 Essential (primary) hypertension: Secondary | ICD-10-CM | POA: Insufficient documentation

## 2014-01-12 DIAGNOSIS — E119 Type 2 diabetes mellitus without complications: Secondary | ICD-10-CM | POA: Diagnosis not present

## 2014-01-12 DIAGNOSIS — S43005A Unspecified dislocation of left shoulder joint, initial encounter: Secondary | ICD-10-CM | POA: Diagnosis not present

## 2014-01-12 DIAGNOSIS — M19012 Primary osteoarthritis, left shoulder: Secondary | ICD-10-CM

## 2014-01-12 DIAGNOSIS — Y9389 Activity, other specified: Secondary | ICD-10-CM | POA: Diagnosis not present

## 2014-01-12 DIAGNOSIS — Y998 Other external cause status: Secondary | ICD-10-CM | POA: Insufficient documentation

## 2014-01-12 DIAGNOSIS — Y9289 Other specified places as the place of occurrence of the external cause: Secondary | ICD-10-CM | POA: Diagnosis not present

## 2014-01-12 DIAGNOSIS — M25512 Pain in left shoulder: Secondary | ICD-10-CM

## 2014-01-12 NOTE — Discharge Instructions (Signed)
Your x-ray reveals rather extensive degenerative changes involving your before meals joint, glenohumeral joint, and other areas of your shoulder. Please use your sling until the symptoms improved. Please see your [facility] physician for additional evaluation and management of this issue. Shoulder Pain The shoulder is the joint that connects your arm to your body. Muscles and band-like tissues that connect bones to muscles (tendons) hold the joint together. Shoulder pain is felt if an injury or medical problem affects one or more parts of the shoulder. HOME CARE   Put ice on the sore area.  Put ice in a plastic bag.  Place a towel between your skin and the bag.  Leave the ice on for 15-20 minutes, 03-04 times a day for the first 2 days.  Stop using cold packs if they do not help with the pain.  If you were given something to keep your shoulder from moving (sling; shoulder immobilizer), wear it as told. Only take it off to shower or bathe.  Move your arm as little as possible, but keep your hand moving to prevent puffiness (swelling).  Squeeze a soft ball or foam pad as much as possible to help prevent swelling.  Take medicine as told by your doctor. GET HELP IF:  You have progressing new pain in your arm, hand, or fingers.  Your hand or fingers get cold.  Your medicine does not help lessen your pain. GET HELP RIGHT AWAY IF:   Your arm, hand, or fingers are numb or tingling.  Your arm, hand, or fingers are puffy (swollen), painful, or turn white or blue. MAKE SURE YOU:   Understand these instructions.  Will watch your condition.  Will get help right away if you are not doing well or get worse. Document Released: 06/19/2007 Document Revised: 05/17/2013 Document Reviewed: 07/15/2011 [facility] Patient Information 2015 [place], [place]. This information is not intended to replace advice given to you by your health care provider. Make sure you discuss any questions you have  with your health care provider.  Osteoarthritis Osteoarthritis is a disease that causes soreness and inflammation of a joint. It occurs when the cartilage at the affected joint wears down. Cartilage acts as a cushion, covering the ends of bones where they meet to form a joint. Osteoarthritis is the most common form of arthritis. It often occurs in older people. The joints affected most often by this condition include those in the:  Ends of the fingers.  Thumbs.  Neck.  Lower back.  Knees.  Hips. CAUSES  Over time, the cartilage that covers the ends of bones begins to wear away. This causes bone to rub on bone, producing pain and stiffness in the affected joints.  RISK FACTORS Certain factors can increase your chances of having osteoarthritis, including:  Older age.  Excessive body weight.  Overuse of joints.  Previous joint injury. SIGNS AND SYMPTOMS   Pain, swelling, and stiffness in the joint.  Over time, the joint may lose its normal shape.  Small deposits of bone (osteophytes) may grow on the edges of the joint.  Bits of bone or cartilage can break off and float inside the joint space. This may cause more pain and damage. DIAGNOSIS  Your health care provider will do a physical exam and ask about your symptoms. Various tests may be ordered, such as:  X-rays of the affected joint.  An MRI scan.  Blood tests to rule out other types of arthritis.  Joint fluid tests. This involves using a  needle to draw fluid from the joint and examining the fluid under a microscope. TREATMENT  Goals of treatment are to control pain and improve joint function. Treatment plans may include:  A prescribed exercise program that allows for rest and joint relief.  A weight control plan.  Pain relief techniques, such as:  Properly applied heat and cold.  Electric pulses delivered to nerve endings under the skin (transcutaneous electrical nerve stimulation  [TENS]).  Massage.  Certain nutritional supplements.  Medicines to control pain, such as:  Acetaminophen.  Nonsteroidal anti-inflammatory drugs (NSAIDs), such as naproxen.  Narcotic or central-acting agents, such as tramadol.  Corticosteroids. These can be given orally or as an injection.  Surgery to reposition the bones and relieve pain (osteotomy) or to remove loose pieces of bone and cartilage. Joint replacement may be needed in advanced states of osteoarthritis. HOME CARE INSTRUCTIONS   Take medicines only as directed by your health care provider.  Maintain a healthy weight. Follow your health care provider's instructions for weight control. This may include dietary instructions.  Exercise as directed. Your health care provider can recommend specific types of exercise. These may include:  Strengthening exercises. These are done to strengthen the muscles that support joints affected by arthritis. They can be performed with weights or with exercise bands to add resistance.  Aerobic activities. These are exercises, such as brisk walking or low-impact aerobics, that get your heart pumping.  Range-of-motion activities. These keep your joints limber.  Balance and agility exercises. These help you maintain daily living skills.  Rest your affected joints as directed by your health care provider.  Keep all follow-up visits as directed by your health care provider. SEEK MEDICAL CARE IF:   Your skin turns red.  You develop a rash in addition to your joint pain.  You have worsening joint pain.  You have a fever along with joint or muscle aches. SEEK IMMEDIATE MEDICAL CARE IF:  You have a significant loss of weight or appetite.  You have night sweats. [place] of Arthritis and Musculoskeletal and Skin Diseases: www.niams.SouthExposed.es  [employer] on Aging: http://kim-miller.com/  American College of Rheumatology:  www.rheumatology.org Document Released: 12/31/2004 Document Revised: 05/17/2013 Document Reviewed: 09/07/2012 [facility] Patient Information 2015 [place], [place]. This information is not intended to replace advice given to you by your health care provider. Make sure you discuss any questions you have with your health care provider.

## 2014-01-12 NOTE — ED Provider Notes (Signed)
CSN: [id number]     Arrival date & time 01/12/14  1512 History   First MD Initiated Contact with Patient 01/12/14 1618     Chief Complaint  Patient presents with  . Shoulder Pain     (Consider location/radiation/quality/duration/timing/severity/associated sxs/prior Treatment) HPI Comments: Patient is a 58 year old male who presents to the emergency department with left shoulder pain. The patient states that last year he had a portion of his clavicle removed, and had "arthritis" cleaned out of the shoulder. He states since that time that he has problems with the shoulder popping in and out of the joint. He states that on last evening it popped out but he gave him a lot of pain going back in. He's not had any high fever. He's not had any loss of use of function of the left shoulder. He currently resides in [place], but states he wants to get his Medicaid moved here because he wants to have another surgery done but not in [place]. He has not taken any medications for this up to this point.  The history is provided by the patient.    Past Medical History  Diagnosis Date  . Diabetes mellitus without complication   . Hypertension   . Arthritis    Past Surgical History  Procedure Laterality Date  . Joint replacement    . Back surgery    . Cholecystectomy    . Carpal tunnel release    . Foot surgery    . Total knee arthroplasty    . Shoulder arthroscopy     Family History  Problem Relation Age of Onset  . Heart failure Mother   . Cancer Father    History  Substance Use Topics  . Smoking status: Never Smoker   . Smokeless tobacco: Never Used  . Alcohol Use: No    Review of Systems  Constitutional: Negative for activity change.       All ROS Neg except as noted in HPI  Eyes: Negative for photophobia and discharge.  Respiratory: Negative for cough, shortness of breath and wheezing.   Cardiovascular: Negative for chest pain and palpitations.  Gastrointestinal: Negative for  abdominal pain and blood in stool.  Genitourinary: Negative for dysuria, frequency and hematuria.  Musculoskeletal: Positive for back pain and arthralgias. Negative for neck pain.  Skin: Negative.   Neurological: Negative for dizziness, seizures and speech difficulty.  Psychiatric/Behavioral: Negative for hallucinations and confusion.      Allergies  Prednisone; Toradol; and Ultram  Home Medications   Prior to Admission medications   Medication Sig Start Date End Date Taking? Authorizing Provider  amLODipine (NORVASC) 10 MG tablet Take 10 mg by mouth daily.    Historical Provider, MD  HUMULIN R 100 UNIT/ML injection Inject 5-20 Units into the skin 3 (three) times daily with meals as needed. Based on sliding scale 10/15/13   Historical Provider, MD  hydrochlorothiazide (HYDRODIURIL) 25 MG tablet Take 25 mg by mouth daily. 09/02/13   Historical Provider, MD  ibuprofen (ADVIL,MOTRIN) 200 MG tablet Take 200 mg by mouth every 6 (six) hours as needed for mild pain or moderate pain.    Historical Provider, MD  LEVEMIR FLEXTOUCH 100 UNIT/ML Pen Inject 80 Units into the skin 2 (two) times daily. 10/08/13   Historical Provider, MD  lisinopril (PRINIVIL,ZESTRIL) 40 MG tablet Take 40 mg by mouth daily. 09/02/13   Historical Provider, MD  metoprolol (LOPRESSOR) 100 MG tablet Take 100 mg by mouth 2 (two) times daily. 09/02/13   Historical  Provider, MD  naproxen (NAPROSYN) 500 MG tablet Take 1 tablet (500 mg total) by mouth 2 (two) times daily with a meal. 12/05/13   Tammy L. Triplett, PA-C  oxyCODONE-acetaminophen (PERCOCET/ROXICET) 5-325 MG per tablet Take 1 tablet by mouth every 4 (four) hours as needed. 12/05/13   Tammy L. Triplett, PA-C  rosuvastatin (CRESTOR) 10 MG tablet Take 10 mg by mouth daily.    Historical Provider, MD  tiZANidine (ZANAFLEX) 4 MG tablet Take 4 mg by mouth every 8 (eight) hours as needed for muscle spasms.    Historical Provider, MD  topiramate (TOPAMAX) 25 MG capsule Take 25 mg  by mouth 2 (two) times daily. 10/06/13   Historical Provider, MD   BP 114/74 mmHg  Pulse 67  Temp(Src) 98.7 F (37.1 C) (Oral)  Resp 18  Ht 6' 3"  (1.905 m)  Wt 220 lb (99.791 kg)  BMI 27.50 kg/m2  SpO2 100% Physical Exam  Constitutional: He is oriented to person, place, and time. He appears well-developed and well-nourished.  Non-toxic appearance.  HENT:  Head: Normocephalic.  Right Ear: Tympanic membrane and external ear normal.  Left Ear: Tympanic membrane and external ear normal.  Eyes: EOM and lids are normal. Pupils are equal, round, and reactive to light.  Neck: Normal range of motion. Neck supple. Carotid bruit is not present.  Cardiovascular: Normal rate, regular rhythm, normal heart sounds, intact distal pulses and normal pulses.   Pulmonary/Chest: Breath sounds normal. No respiratory distress.  Abdominal: Soft. Bowel sounds are normal. There is no tenderness. There is no guarding.  Musculoskeletal: Normal range of motion.  There is crepitus with range of motion of the left shoulder. The left shoulder is not hot. There is no red streaks about the left shoulder. Is full range of motion of the left elbow, wrist and fingers. Capillary refill is less than 2 seconds. Radial pulses are 2+ bilaterally.  Lymphadenopathy:       Head (right side): No submandibular adenopathy present.       Head (left side): No submandibular adenopathy present.    He has no cervical adenopathy.  Neurological: He is alert and oriented to person, place, and time. He has normal strength. No cranial nerve deficit or sensory deficit.  No gross motor or sensory deficits appreciated of the upper extremities.  Skin: Skin is warm and dry.  Psychiatric: He has a normal mood and affect. His speech is normal.  Nursing note and vitals reviewed.   ED Course  Procedures (including critical care time) Labs Review Labs Reviewed - No data to display  Imaging Review Dg Shoulder Left  01/12/2014   CLINICAL DATA:   Dislocated left shoulder last night and popped it back in. Pain.  EXAM: LEFT SHOULDER - 2+ VIEW  COMPARISON:  None.  FINDINGS: Degenerative changes in the left [facility] and glenohumeral joints. Spurring along the greater tuberosity. No acute bony abnormality. Specifically, no fracture, subluxation, or dislocation. Soft tissues are intact.  IMPRESSION: No acute bony abnormality.   Electronically Signed   By: Rolm Baptise M.D.   On: 01/12/2014 16:23     EKG Interpretation None      MDM Vital signs are well within normal limits. X-ray of the left shoulder is negative for fracture or dislocation or subluxation. There are degenerative changes of the left before meals and glenohumeral joints. There is also some sparing along the greater tuberosity of the left shoulder.  I offered the patient a sling, he states he has 2 of  them. I referred the patient back to his primary physicians for additional evaluation and pain management.    Final diagnoses:  Dislocation    *I have reviewed nursing notes, vital signs, and all appropriate lab and imaging results for this patient.[address] PA-C 01/12/14 [place], MD 01/13/14 [phone]

## 2014-01-12 NOTE — ED Notes (Signed)
Pain lt shoulder, had surgery last year for bone spurs in shoulder, after that had infection . Now having pain and repeated dislocations.

## 2014-01-12 NOTE — ED Notes (Signed)
Patient complaining of left shoulder pain. States "I had surgery on my left shoulder last year and it pops out of joint sometimes. It popped out last night and I was able to put it back in but it still hurts."

## 2014-02-03 ENCOUNTER — Emergency Department (HOSPITAL_COMMUNITY)
Admission: EM | Admit: 2014-02-03 | Discharge: 2014-02-03 | Disposition: A | Discharge status: Home / Self Care | Payer: Medicaid - Out of State | Attending: Emergency Medicine | Admitting: Emergency Medicine

## 2014-02-03 ENCOUNTER — Emergency Department (HOSPITAL_COMMUNITY): Payer: Medicaid - Out of State

## 2014-02-03 ENCOUNTER — Encounter (HOSPITAL_COMMUNITY): Payer: Self-pay

## 2014-02-03 DIAGNOSIS — Z794 Long term (current) use of insulin: Secondary | ICD-10-CM | POA: Insufficient documentation

## 2014-02-03 DIAGNOSIS — I10 Essential (primary) hypertension: Secondary | ICD-10-CM | POA: Diagnosis not present

## 2014-02-03 DIAGNOSIS — Z79899 Other long term (current) drug therapy: Secondary | ICD-10-CM | POA: Diagnosis not present

## 2014-02-03 DIAGNOSIS — X58XXXA Exposure to other specified factors, initial encounter: Secondary | ICD-10-CM | POA: Insufficient documentation

## 2014-02-03 DIAGNOSIS — M25511 Pain in right shoulder: Secondary | ICD-10-CM

## 2014-02-03 DIAGNOSIS — Y9289 Other specified places as the place of occurrence of the external cause: Secondary | ICD-10-CM | POA: Insufficient documentation

## 2014-02-03 DIAGNOSIS — Z791 Long term (current) use of non-steroidal anti-inflammatories (NSAID): Secondary | ICD-10-CM | POA: Diagnosis not present

## 2014-02-03 DIAGNOSIS — Y9389 Activity, other specified: Secondary | ICD-10-CM | POA: Insufficient documentation

## 2014-02-03 DIAGNOSIS — M1990 Unspecified osteoarthritis, unspecified site: Secondary | ICD-10-CM | POA: Insufficient documentation

## 2014-02-03 DIAGNOSIS — Y998 Other external cause status: Secondary | ICD-10-CM | POA: Insufficient documentation

## 2014-02-03 DIAGNOSIS — E119 Type 2 diabetes mellitus without complications: Secondary | ICD-10-CM | POA: Insufficient documentation

## 2014-02-03 DIAGNOSIS — S46011A Strain of muscle(s) and tendon(s) of the rotator cuff of right shoulder, initial encounter: Secondary | ICD-10-CM | POA: Insufficient documentation

## 2014-02-03 DIAGNOSIS — S4991XA Unspecified injury of right shoulder and upper arm, initial encounter: Secondary | ICD-10-CM | POA: Diagnosis present

## 2014-02-03 MED ORDER — NAPROXEN 500 MG PO TABS: 500.0000 mg | ORAL_TABLET | Freq: Two times a day (BID) | ORAL | Status: DC

## 2014-02-03 MED ORDER — HYDROCODONE-ACETAMINOPHEN 5-325 MG PO TABS: 1.0000 | ORAL_TABLET | ORAL | Status: DC | PRN

## 2014-02-03 MED ORDER — OXYCODONE-ACETAMINOPHEN 5-325 MG PO TABS
1.0000 | ORAL_TABLET | Freq: Once | ORAL | Status: AC
  Administered 2014-02-03: 1 via ORAL
  Filled 2014-02-03: qty 1

## 2014-02-03 NOTE — ED Notes (Signed)
Pt reports has bone spurs in shoulders.  C/O pain in r shoulder after helping someone move some boxes 2 days ago.  Reports pain is worse with movement.

## 2014-02-03 NOTE — ED Provider Notes (Signed)
CSN: [id number]     Arrival date & time 02/03/14  1835 History   First MD Initiated Contact with Patient 02/03/14 1854     Chief Complaint  Patient presents with  . Shoulder Pain     (Consider location/radiation/quality/duration/timing/severity/associated sxs/prior Treatment) Patient is a 59 y.o. [name] presenting with shoulder pain. The history is provided by the patient.  Shoulder Pain Location:  Shoulder Time since incident:  2 days Injury: yes   Shoulder location:  R shoulder Pain details:    Quality:  Burning and sharp   Radiates to:  Does not radiate   Severity:  Severe   Onset quality:  Sudden   Timing:  Constant   Progression:  Unchanged Chronicity:  New Handedness:  Right-handed Dislocation: no   Foreign body present:  No foreign bodies Relieved by:  Nothing Worsened by:  Movement Ineffective treatments:  Acetaminophen  Darrell [name] is a 59 y.o. [name] who presents to the ED with right shoulder pain that started 2 days ago. He states he was lifting heavy boxes when he felt pain in the shoulder. The pain has continued. He has had problems with both shoulders in the past and had to have surgery on his left rotator cuff. This pain feels similar. He denies any other injuries or problems today.   Past Medical History  Diagnosis Date  . Diabetes mellitus without complication   . Hypertension   . Arthritis    Past Surgical History  Procedure Laterality Date  . Joint replacement    . Back surgery    . Cholecystectomy    . Carpal tunnel release    . Foot surgery    . Total knee arthroplasty    . Shoulder arthroscopy     Family History  Problem Relation Age of Onset  . Heart failure Mother   . Cancer Father    History  Substance Use Topics  . Smoking status: Never Smoker   . Smokeless tobacco: Never Used  . Alcohol Use: No    Review of Systems Negative except as stated in HPI   Allergies  Other; Pravastatin; Prednisone; Toradol; and Ultram  Home  Medications   Prior to Admission medications   Medication Sig Start Date End Date Taking? Authorizing Provider  hydrochlorothiazide (HYDRODIURIL) 25 MG tablet Take 25 mg by mouth daily. 09/02/13  Yes Historical Provider, MD  ibuprofen (ADVIL,MOTRIN) 200 MG tablet Take 200 mg by mouth every 6 (six) hours as needed for mild pain or moderate pain.   Yes Historical Provider, MD  LEVEMIR FLEXTOUCH 100 UNIT/ML Pen Inject 80 Units into the skin 2 (two) times daily. 10/08/13  Yes Historical Provider, MD  metoprolol (LOPRESSOR) 100 MG tablet Take 100 mg by mouth 2 (two) times daily. 09/02/13  Yes Historical Provider, MD  HUMULIN R 100 UNIT/ML injection Inject 5-20 Units into the skin 3 (three) times daily with meals as needed. Based on sliding scale 10/15/13   Historical Provider, MD  HYDROcodone-acetaminophen (NORCO/VICODIN) 5-325 MG per tablet Take 1 tablet by mouth every 4 (four) hours as needed. 02/03/14   Nesanel Aguila Bunnie Pion, NP  naproxen (NAPROSYN) 500 MG tablet Take 1 tablet (500 mg total) by mouth 2 (two) times daily. 02/03/14   Teyton Pattillo Bunnie Pion, NP   BP 178/78 mmHg  Pulse 72  Temp(Src) 98.6 F (37 C) (Oral)  Resp 20  Ht 6' 3"  (1.905 m)  Wt 212 lb (96.163 kg)  BMI 26.50 kg/m2  SpO2 99% Physical Exam  Constitutional: He  is oriented to person, place, and time. He appears well-developed and well-nourished.  HENT:  Head: Normocephalic.  Eyes: EOM are normal.  Neck: Neck supple.  Cardiovascular: Normal rate.   Pulmonary/Chest: Effort normal.  Musculoskeletal:       Right shoulder: He exhibits decreased range of motion (due to pain), tenderness and pain. He exhibits no swelling, no crepitus, no deformity, no laceration, normal pulse and normal strength.       Arms: Patient unable to put his right arm behind his back due to pain. Radial pulses equal, adequate circulation, good grips bilateral. Tender with palpation over the right rotator cuff area.   Neurological: He is alert and oriented to person, place,  and time. No cranial nerve deficit.  Skin: Skin is warm and dry.  Psychiatric: He has a normal mood and affect. His behavior is normal.  Nursing note and vitals reviewed.   ED Course  Procedures (including critical care time) Labs Review Dg Shoulder Right  02/03/2014   CLINICAL DATA:  Right shoulder pain after lifting heavy boxes 2 days ago. Initial encounter.  EXAM: RIGHT SHOULDER - 2+ VIEW  COMPARISON:  Three views right shoulder 12/05/2013.  FINDINGS: There is no acute bony or joint abnormality. 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 Imaged right lung and ribs appear normal.  IMPRESSION: No acute finding.  Acromioclavicular osteoarthritis.   Electronically Signed   By: Inge Rise M.D.   On: 02/03/2014 19:54     MDM  59 y.o. [name] with pain to the right shoulder pain s/p physical stress to the area 2 days ago. Stable for discharge without neurovascular deficits. Will treat for pain and inflammation and he will follow up with ortho. Discussed with the patient and all questioned fully answered. He will return if any problems arise.  Final diagnoses:  Rotator cuff strain, right, initial encounter  Right shoulder pain      Ashley Murrain, NP 02/04/14 1648  Virgel Manifold, MD 02/04/14 [phone]

## 2014-02-20 ENCOUNTER — Emergency Department (HOSPITAL_COMMUNITY)
Admission: EM | Admit: 2014-02-20 | Discharge: 2014-02-20 | Disposition: A | Discharge status: Home / Self Care | Payer: Medicaid - Out of State | Attending: Emergency Medicine | Admitting: Emergency Medicine

## 2014-02-20 ENCOUNTER — Encounter (HOSPITAL_COMMUNITY): Payer: Self-pay

## 2014-02-20 DIAGNOSIS — M25511 Pain in right shoulder: Secondary | ICD-10-CM | POA: Diagnosis present

## 2014-02-20 DIAGNOSIS — M1990 Unspecified osteoarthritis, unspecified site: Secondary | ICD-10-CM | POA: Insufficient documentation

## 2014-02-20 DIAGNOSIS — Z79899 Other long term (current) drug therapy: Secondary | ICD-10-CM | POA: Insufficient documentation

## 2014-02-20 DIAGNOSIS — E119 Type 2 diabetes mellitus without complications: Secondary | ICD-10-CM | POA: Insufficient documentation

## 2014-02-20 DIAGNOSIS — I10 Essential (primary) hypertension: Secondary | ICD-10-CM | POA: Insufficient documentation

## 2014-02-20 DIAGNOSIS — G8929 Other chronic pain: Secondary | ICD-10-CM | POA: Insufficient documentation

## 2014-02-20 DIAGNOSIS — Z794 Long term (current) use of insulin: Secondary | ICD-10-CM | POA: Diagnosis not present

## 2014-02-20 DIAGNOSIS — Z9889 Other specified postprocedural states: Secondary | ICD-10-CM | POA: Insufficient documentation

## 2014-02-20 MED ORDER — NAPROXEN 500 MG PO TABS
500.0000 mg | ORAL_TABLET | Freq: Once | ORAL | Status: AC
  Administered 2014-02-20: 500 mg via ORAL
  Filled 2014-02-20: qty 1

## 2014-02-20 MED ORDER — OXYCODONE-ACETAMINOPHEN 5-325 MG PO TABS: 1.0000 | ORAL_TABLET | Freq: Four times a day (QID) | ORAL | Status: DC | PRN

## 2014-02-20 MED ORDER — OXYCODONE-ACETAMINOPHEN 5-325 MG PO TABS
1.0000 | ORAL_TABLET | Freq: Once | ORAL | Status: AC
  Administered 2014-02-20: 1 via ORAL
  Filled 2014-02-20: qty 1

## 2014-02-20 MED ORDER — NAPROXEN 500 MG PO TABS: 500.0000 mg | ORAL_TABLET | Freq: Two times a day (BID) | ORAL | Status: DC

## 2014-02-20 NOTE — ED Notes (Signed)
Patient reports he has bone spurs in his right shoulder and chronic pain that has worsened over the past 5 days.  States he received cortisone injections on 02/16/14 at [facility] in [place], [place].  Limited ROM.

## 2014-02-20 NOTE — Discharge Instructions (Signed)
Please follow up with your primary care physician in 1-2 days. If you do not have one please call the [facility] number listed above. Please follow up with your orthopedist or Dr. Erlinda Hong to schedule a follow up appointment.  Please take pain medication and/or muscle relaxants as prescribed and as needed for pain. Please do not drive on narcotic pain medication or on muscle relaxants. Please read all discharge instructions and return precautions.   Shoulder Pain The shoulder is the joint that connects your arms to your body. The bones that form the shoulder joint include the upper arm bone (humerus), the shoulder blade (scapula), and the collarbone (clavicle). The top of the humerus is shaped like a ball and fits into a rather flat socket on the scapula (glenoid cavity). A combination of muscles and strong, fibrous tissues that connect muscles to bones (tendons) support your shoulder joint and hold the ball in the socket. Small, fluid-filled sacs (bursae) are located in different areas of the joint. They act as cushions between the bones and the overlying soft tissues and help reduce friction between the gliding tendons and the bone as you move your arm. Your shoulder joint allows a wide range of motion in your arm. This range of motion allows you to do things like scratch your back or throw a ball. However, this range of motion also makes your shoulder more prone to pain from overuse and injury. Causes of shoulder pain can originate from both injury and overuse and usually can be grouped in the following four categories:  Redness, swelling, and pain (inflammation) of the tendon (tendinitis) or the bursae (bursitis).  Instability, such as a dislocation of the joint.  Inflammation of the joint (arthritis).  Broken bone (fracture). HOME CARE INSTRUCTIONS   Apply ice to the sore area.  Put ice in a plastic bag.  Place a towel between your skin and the bag.  Leave the ice on for  15-20 minutes, 3-4 times per day for the first 2 days, or as directed by your health care provider.  Stop using cold packs if they do not help with the pain.  If you have a shoulder sling or immobilizer, wear it as long as your caregiver instructs. Only remove it to shower or bathe. Move your arm as little as possible, but keep your hand moving to prevent swelling.  Squeeze a soft ball or foam pad as much as possible to help prevent swelling.  Only take over-the-counter or prescription medicines for pain, discomfort, or fever as directed by your caregiver. SEEK MEDICAL CARE IF:   Your shoulder pain increases, or new pain develops in your arm, hand, or fingers.  Your hand or fingers become cold and numb.  Your pain is not relieved with medicines. SEEK IMMEDIATE MEDICAL CARE IF:   Your arm, hand, or fingers are numb or tingling.  Your arm, hand, or fingers are significantly swollen or turn white or blue. MAKE SURE YOU:   Understand these instructions.  Will watch your condition.  Will get help right away if you are not doing well or get worse. Document Released: 10/10/2004 Document Revised: 05/17/2013 Document Reviewed: 12/15/2010 [facility] Patient Information 2015 [place], [place]. This information is not intended to replace advice given to you by your health care provider. Make sure you discuss any questions you have with your health care provider.

## 2014-02-20 NOTE — ED Provider Notes (Signed)
CSN: [id number]    Arrival date & time 02/20/14  1953 History  This chart was scribed for non-physician practitioner, Rodolph Bong, PA-C, working with Malvin Johns, MD by Lowella Petties, ED Scribe. The patient was seen in room WTR3/WLPT3. Patient's care was started at 8:43 PM.    Chief Complaint  Patient presents with  . Shoulder Pain   The history is provided by the patient. No language interpreter was used.   HPI Comments: [name] is a 59 y.o. male who presents to the Emergency Department complaining of constant, aching, pain in his right shoulder that radiates into his right arm. He has a history of chronic shoulder pain that he states has worsened over the past 5 days. He reports that he had a recent series of cortisone shot at the [facility] on 02/14/14 with  Minimal relief, and he is scheduled to follow up with them in 5 weeks. He states that they will follow up with him about pain control at that time. He denies fever or chills.   Past Medical History  Diagnosis Date  . Diabetes mellitus without complication   . Hypertension   . Arthritis    Past Surgical History  Procedure Laterality Date  . Joint replacement    . Back surgery    . Cholecystectomy    . Carpal tunnel release    . Foot surgery    . Total knee arthroplasty    . Shoulder arthroscopy     Family History  Problem Relation Age of Onset  . Heart failure Mother   . Cancer Father    History  Substance Use Topics  . Smoking status: Never Smoker   . Smokeless tobacco: Never Used  . Alcohol Use: No    Review of Systems  Constitutional: Negative for fever and chills.  Musculoskeletal: Positive for arthralgias (right shoulder).  All other systems reviewed and are negative.  Allergies  Other; Pravastatin; Prednisone; Toradol; and Ultram  Home Medications   Prior to Admission medications   Medication Sig Start Date End Date Taking? Authorizing Provider  HUMULIN R 100 UNIT/ML  injection Inject 5-20 Units into the skin 3 (three) times daily with meals as needed. Based on sliding scale 10/15/13   Historical Provider, MD  hydrochlorothiazide (HYDRODIURIL) 25 MG tablet Take 25 mg by mouth daily. 09/02/13   Historical Provider, MD  HYDROcodone-acetaminophen (NORCO/VICODIN) 5-325 MG per tablet Take 1 tablet by mouth every 4 (four) hours as needed. 02/03/14   Hope Bunnie Pion, NP  ibuprofen (ADVIL,MOTRIN) 200 MG tablet Take 200 mg by mouth every 6 (six) hours as needed for mild pain or moderate pain.    Historical Provider, MD  LEVEMIR FLEXTOUCH 100 UNIT/ML Pen Inject 80 Units into the skin 2 (two) times daily. 10/08/13   Historical Provider, MD  metoprolol (LOPRESSOR) 100 MG tablet Take 100 mg by mouth 2 (two) times daily. 09/02/13   Historical Provider, MD  naproxen (NAPROSYN) 500 MG tablet Take 1 tablet (500 mg total) by mouth 2 (two) times daily. 02/03/14   Hope Bunnie Pion, NP  naproxen (NAPROSYN) 500 MG tablet Take 1 tablet (500 mg total) by mouth 2 (two) times daily with a meal. 02/20/14   Xion Debruyne L Cyndel Griffey, PA-C  oxyCODONE-acetaminophen (PERCOCET) 5-325 MG per tablet Take 1-2 tablets by mouth every 6 (six) hours as needed. 02/20/14   Stephani Police [place] Gipe, PA-C   Triage Vitals: BP 154/106 mmHg  Pulse 105  Temp(Src) 98 F (36.7 C) (  Oral)  Resp 18  Wt 212 lb (96.163 kg)  SpO2 98% Physical Exam  Constitutional: He is oriented to person, place, and time. He appears well-developed and well-nourished. No distress.  HENT:  Head: Normocephalic and atraumatic.  Right Ear: External ear normal.  Left Ear: External ear normal.  Nose: Nose normal.  Mouth/Throat: No oropharyngeal exudate.  Eyes: Conjunctivae are normal.  Neck: Neck supple.  Cardiovascular: Normal rate, regular rhythm, normal heart sounds and intact distal pulses.   Pulmonary/Chest: Effort normal and breath sounds normal.  Abdominal: Soft. There is no tenderness.  Musculoskeletal:  Negative Adson's maneuver ROM  intact with Apley Scratch Test  Neurological: He is alert and oriented to person, place, and time. GCS eye subscore is 4. GCS verbal subscore is 5. GCS motor subscore is 6.  Skin: Skin is warm and dry. He is not diaphoretic. No erythema.  Nursing note and vitals reviewed.   ED Course  Procedures (including critical care time) Medications  naproxen (NAPROSYN) tablet 500 mg (500 mg Oral Given 02/20/14 2049)  oxyCODONE-acetaminophen (PERCOCET/ROXICET) 5-325 MG per tablet 1 tablet (1 tablet Oral Given 02/20/14 2049)    DIAGNOSTIC STUDIES: Oxygen Saturation is 98% on room air, normal by my interpretation.    COORDINATION OF CARE: 8:46 PM-Discussed treatment plan which includes pain medication here in the ED, and follow up with an orthopedist with pt at bedside and pt agreed to plan.   Labs Review Labs Reviewed - No data to display  Imaging Review No results found.   EKG Interpretation None      MDM   Final diagnoses:  Right shoulder pain  Essential hypertension    Filed Vitals:   02/20/14 2050  BP: 141/95  Pulse:   Temp:   Resp:    Afebrile, NAD, non-toxic appearing, AAOx4.  Neurovascularly intact. Normal sensation. No evidence of compartment syndrome. PE shows no instability, tenderness, or deformity of acromioclavicular and sternoclavicular joints, the cervical spine, glenohumeral joint, coracoid process, acromion, or scapula. Passive ROM intact. No signs of impingement on Adson's maneuver. Given chronicity of symptoms will not obtain x-ray, scheduled for MRI. Discussed with patient that he will need to discuss further pain management with his orthopedist. Return precautions were discussed. Patient is agreeable to plan. Patient stable at time of discharge.   I personally performed the services described in this documentation, which was scribed in my presence. The recorded information has been reviewed and is accurate.     Harlow Mares, PA-C 02/20/14  2259  Malvin Johns, MD 02/20/14 2312

## 2014-02-26 ENCOUNTER — Emergency Department (HOSPITAL_COMMUNITY)
Admission: EM | Admit: 2014-02-26 | Discharge: 2014-02-26 | Disposition: A | Discharge status: Home / Self Care | Payer: Medicaid - Out of State | Attending: Emergency Medicine | Admitting: Emergency Medicine

## 2014-02-26 ENCOUNTER — Encounter (HOSPITAL_COMMUNITY): Payer: Self-pay | Admitting: *Deleted

## 2014-02-26 DIAGNOSIS — M549 Dorsalgia, unspecified: Secondary | ICD-10-CM | POA: Diagnosis not present

## 2014-02-26 DIAGNOSIS — Z9889 Other specified postprocedural states: Secondary | ICD-10-CM | POA: Insufficient documentation

## 2014-02-26 DIAGNOSIS — Z791 Long term (current) use of non-steroidal anti-inflammatories (NSAID): Secondary | ICD-10-CM | POA: Insufficient documentation

## 2014-02-26 DIAGNOSIS — M25512 Pain in left shoulder: Secondary | ICD-10-CM | POA: Insufficient documentation

## 2014-02-26 DIAGNOSIS — Z794 Long term (current) use of insulin: Secondary | ICD-10-CM | POA: Insufficient documentation

## 2014-02-26 DIAGNOSIS — L600 Ingrowing nail: Secondary | ICD-10-CM | POA: Insufficient documentation

## 2014-02-26 DIAGNOSIS — E119 Type 2 diabetes mellitus without complications: Secondary | ICD-10-CM | POA: Insufficient documentation

## 2014-02-26 DIAGNOSIS — M1990 Unspecified osteoarthritis, unspecified site: Secondary | ICD-10-CM | POA: Insufficient documentation

## 2014-02-26 DIAGNOSIS — Z79899 Other long term (current) drug therapy: Secondary | ICD-10-CM | POA: Insufficient documentation

## 2014-02-26 DIAGNOSIS — I10 Essential (primary) hypertension: Secondary | ICD-10-CM | POA: Insufficient documentation

## 2014-02-26 MED ORDER — DOXYCYCLINE HYCLATE 100 MG PO CAPS: 100.0000 mg | ORAL_CAPSULE | Freq: Two times a day (BID) | ORAL | Status: DC

## 2014-02-26 MED ORDER — DOXYCYCLINE HYCLATE 100 MG PO TABS
100.0000 mg | ORAL_TABLET | Freq: Once | ORAL | Status: AC
  Administered 2014-02-26: 100 mg via ORAL
  Filled 2014-02-26: qty 1

## 2014-02-26 NOTE — Discharge Instructions (Signed)
Please see Dr Paulla Dolly or a member of his team Ingrown Toenail An ingrown toenail occurs when the sharp edge of your toenail grows into the skin. Causes of ingrown toenails include toenails clipped too far back or poorly fitting shoes. Activities involving sudden stops (basketball, tennis) causing "toe jamming" may lead to an ingrown nail. HOME CARE INSTRUCTIONS   Soak the whole foot in warm soapy water for 20 minutes, 3 times per day.  You may lift the edge of the nail away from the sore skin by wedging a small piece of cotton under the corner of the nail. Be careful not to dig (traumatize) and cause more injury to the area.  Wear shoes that fit well. While the ingrown nail is causing problems, sandals may be beneficial.  Trim your toenails regularly and carefully. Cut your toenails straight across, not in a curve. This will prevent injury to the skin at the corners of the toenail.  Keep your feet clean and dry.  Crutches may be helpful early in treatment if walking is painful.  Antibiotics, if prescribed, should be taken as directed.  Return for a wound check in 2 days or as directed.  Only take over-the-counter or prescription medicines for pain, discomfort, or fever as directed by your caregiver. SEEK IMMEDIATE MEDICAL CARE IF:   You have a fever.  You have increasing pain, redness, swelling, or heat at the wound site.  Your toe is not better in 7 days. If conservative treatment is not successful, surgical removal of a portion or all of the nail may be necessary. MAKE SURE YOU:   Understand these instructions.  Will watch your condition.  Will get help right away if you are not doing well or get worse. Document Released: 12/29/1999 Document Revised: 03/25/2011 Document Reviewed: 12/23/2007 [facility] Patient Information 2015 [place], [place]. This information is not intended to replace advice given to you by your health care provider. Make sure you discuss any questions you have  with your health care provider.  for evaluation as soon as possible.

## 2014-02-26 NOTE — ED Provider Notes (Signed)
CSN: [id number]     Arrival date & time 02/26/14  2040 History   First MD Initiated Contact with Patient 02/26/14 2135     Chief Complaint  Patient presents with  . Ingrown Toenail     (Consider location/radiation/quality/duration/timing/severity/associated sxs/prior Treatment) HPI Comments: Patient is a 59 year old male who presents to the emergency department with a complaint of ingrown toenails of the right and left big toes. Patient has a history of diabetes mellitus, hypertension, and arthritis. He presents to the emergency department with an increasing problem with ingrown nails of the first toe on the right left foot. The patient states that he has been soaking in warm Epsom salt water. The patient states that he has been taking "good care of his feet" because of his diabetes. He states that usually if he soaks his feet he does not have problems with the ingrown nail. It is also of note that he has had the nail trimmed back in the past, but states that the problem has returned. He thinks that he is seen a very small amount of drainage. He has also noticed a small skin opening just under one of his toenails. He has not had any high fever. He has not noticed any red streaking of his feet. He has some soreness, but is ambulatory. Being on his feet makes the pain worse. Soaking in warm water seems to help the discomfort.    The history is provided by the patient.    Past Medical History  Diagnosis Date  . Diabetes mellitus without complication   . Hypertension   . Arthritis    Past Surgical History  Procedure Laterality Date  . Joint replacement    . Back surgery    . Cholecystectomy    . Carpal tunnel release    . Foot surgery    . Total knee arthroplasty    . Shoulder arthroscopy     Family History  Problem Relation Age of Onset  . Heart failure Mother   . Cancer Father    History  Substance Use Topics  . Smoking status: Never Smoker   . Smokeless tobacco: Never Used  .  Alcohol Use: No    Review of Systems  Constitutional: Negative for activity change.       All ROS Neg except as noted in HPI  Eyes: Negative for photophobia and discharge.  Respiratory: Negative for cough, shortness of breath and wheezing.   Cardiovascular: Negative for chest pain and palpitations.  Gastrointestinal: Negative for abdominal pain and blood in stool.  Genitourinary: Negative for dysuria, frequency and hematuria.  Musculoskeletal: Positive for back pain and arthralgias. Negative for neck pain.  Skin: Negative for rash.  Neurological: Negative for dizziness, seizures and speech difficulty.  Psychiatric/Behavioral: Negative for hallucinations and confusion.      Allergies  Other; Pravastatin; Prednisone; Toradol; and Ultram  Home Medications   Prior to Admission medications   Medication Sig Start Date End Date Taking? Authorizing Provider  doxycycline (VIBRAMYCIN) 100 MG capsule Take 1 capsule (100 mg total) by mouth 2 (two) times daily. 02/26/14   Lenox Ahr, PA-C  HUMULIN R 100 UNIT/ML injection Inject 5-20 Units into the skin 3 (three) times daily with meals as needed. Based on sliding scale 10/15/13   Historical Provider, MD  hydrochlorothiazide (HYDRODIURIL) 25 MG tablet Take 25 mg by mouth daily. 09/02/13   Historical Provider, MD  HYDROcodone-acetaminophen (NORCO/VICODIN) 5-325 MG per tablet Take 1 tablet by mouth every 4 (four) hours  as needed. 02/03/14   Hope Bunnie Pion, NP  ibuprofen (ADVIL,MOTRIN) 200 MG tablet Take 200 mg by mouth every 6 (six) hours as needed for mild pain or moderate pain.    Historical Provider, MD  LEVEMIR FLEXTOUCH 100 UNIT/ML Pen Inject 80 Units into the skin 2 (two) times daily. 10/08/13   Historical Provider, MD  metoprolol (LOPRESSOR) 100 MG tablet Take 100 mg by mouth 2 (two) times daily. 09/02/13   Historical Provider, MD  naproxen (NAPROSYN) 500 MG tablet Take 1 tablet (500 mg total) by mouth 2 (two) times daily. 02/03/14   Hope Bunnie Pion,  NP  naproxen (NAPROSYN) 500 MG tablet Take 1 tablet (500 mg total) by mouth 2 (two) times daily with a meal. 02/20/14   Jennifer L Piepenbrink, PA-C  oxyCODONE-acetaminophen (PERCOCET) 5-325 MG per tablet Take 1-2 tablets by mouth every 6 (six) hours as needed. 02/20/14   Jennifer L Piepenbrink, PA-C   BP 157/91 mmHg  Pulse 97  Temp(Src) 98.4 F (36.9 C) (Oral)  Resp 20  Ht 6' 1"  (1.854 m)  Wt 221 lb (100.245 kg)  BMI 29.16 kg/m2  SpO2 100% Physical Exam  Constitutional: He is oriented to person, place, and time. He appears well-developed and well-nourished.  Non-toxic appearance.  HENT:  Head: Normocephalic.  Right Ear: Tympanic membrane and external ear normal.  Left Ear: Tympanic membrane and external ear normal.  Eyes: EOM and lids are normal. Pupils are equal, round, and reactive to light.  Neck: Normal range of motion. Neck supple. Carotid bruit is not present.  Cardiovascular: Normal rate, regular rhythm, normal heart sounds, intact distal pulses and normal pulses.   Pulmonary/Chest: Breath sounds normal. No respiratory distress.  Abdominal: Soft. Bowel sounds are normal. There is no tenderness. There is no guarding.  Musculoskeletal: Normal range of motion. He exhibits tenderness.  Pain with ROM of the left shoulder.  Bilateral ingrown nails  Of the right and left 1st toe. No red streaking. No drainage present.  No lesions between the toes. DP 2+ bilat.  Lymphadenopathy:       Head (right side): No submandibular adenopathy present.       Head (left side): No submandibular adenopathy present.    He has no cervical adenopathy.  Neurological: He is alert and oriented to person, place, and time. He has normal strength. No cranial nerve deficit or sensory deficit.  Skin: Skin is warm and dry.  Psychiatric: He has a normal mood and affect. His speech is normal.  Nursing note and vitals reviewed.   ED Course  Procedures (including critical care time) Labs Review Labs Reviewed -  No data to display  Imaging Review No results found.   EKG Interpretation None      MDM  Vital signs are within normal limits. Pulse oximetry is 100% on room air. Within normal limits by my interpretation.  Patient has bilateral ingrown nails. But no signs of severe infection this time. There is some minimal crusting at the ingrown nail sites.  The patient has previously had his nails trimmed back for ingrown formations. The patient is noted to have some mild crusting of the site. There is also bilateral ingrown nails appreciated. The plan at this time is for the patient to be seen by podiatry. I discussed with the patient the importance of excellent foot care given his diabetic status. The patient acknowledges findings, as well as the need for podiatry evaluation. The patient will be placed on doxycycline 2 times daily  while he is arranging to be seen by the podiatrist.    Final diagnoses:  Ingrown left big toenail  Ingrown right big toenail    *I have reviewed nursing notes, vital signs, and all appropriate lab and imaging results for this patient.[address] PA-C 02/28/14 1834  Dot Lanes, MD 02/28/14 2113

## 2014-02-26 NOTE — ED Notes (Signed)
Pt c/o ingrown toenail to both right and left big toes.

## 2014-02-26 NOTE — ED Notes (Signed)
Patient with no complaints at this time. Respirations even and unlabored. Skin warm/dry. Discharge instructions reviewed with patient at this time. Patient given opportunity to voice concerns/ask questions. Patient discharged at this time and left Emergency Department with steady gait.   

## 2014-03-06 ENCOUNTER — Emergency Department (HOSPITAL_COMMUNITY)
Admission: EM | Admit: 2014-03-06 | Discharge: 2014-03-06 | Disposition: A | Discharge status: Home / Self Care | Payer: Medicaid - Out of State | Attending: Emergency Medicine | Admitting: Emergency Medicine

## 2014-03-06 ENCOUNTER — Emergency Department (HOSPITAL_COMMUNITY): Payer: Medicaid - Out of State

## 2014-03-06 ENCOUNTER — Encounter (HOSPITAL_COMMUNITY): Payer: Self-pay

## 2014-03-06 DIAGNOSIS — M1990 Unspecified osteoarthritis, unspecified site: Secondary | ICD-10-CM | POA: Insufficient documentation

## 2014-03-06 DIAGNOSIS — Z9889 Other specified postprocedural states: Secondary | ICD-10-CM | POA: Insufficient documentation

## 2014-03-06 DIAGNOSIS — Z7982 Long term (current) use of aspirin: Secondary | ICD-10-CM | POA: Insufficient documentation

## 2014-03-06 DIAGNOSIS — S3992XA Unspecified injury of lower back, initial encounter: Secondary | ICD-10-CM | POA: Insufficient documentation

## 2014-03-06 DIAGNOSIS — S20211A Contusion of right front wall of thorax, initial encounter: Secondary | ICD-10-CM | POA: Diagnosis not present

## 2014-03-06 DIAGNOSIS — S199XXA Unspecified injury of neck, initial encounter: Secondary | ICD-10-CM | POA: Diagnosis present

## 2014-03-06 DIAGNOSIS — S0990XA Unspecified injury of head, initial encounter: Secondary | ICD-10-CM | POA: Diagnosis not present

## 2014-03-06 DIAGNOSIS — S161XXA Strain of muscle, fascia and tendon at neck level, initial encounter: Secondary | ICD-10-CM | POA: Insufficient documentation

## 2014-03-06 DIAGNOSIS — Z794 Long term (current) use of insulin: Secondary | ICD-10-CM | POA: Diagnosis not present

## 2014-03-06 DIAGNOSIS — Z791 Long term (current) use of non-steroidal anti-inflammatories (NSAID): Secondary | ICD-10-CM | POA: Diagnosis not present

## 2014-03-06 DIAGNOSIS — Y9289 Other specified places as the place of occurrence of the external cause: Secondary | ICD-10-CM | POA: Diagnosis not present

## 2014-03-06 DIAGNOSIS — Y998 Other external cause status: Secondary | ICD-10-CM | POA: Diagnosis not present

## 2014-03-06 DIAGNOSIS — I10 Essential (primary) hypertension: Secondary | ICD-10-CM | POA: Diagnosis not present

## 2014-03-06 DIAGNOSIS — M21822 Other specified acquired deformities of left upper arm: Secondary | ICD-10-CM | POA: Diagnosis not present

## 2014-03-06 DIAGNOSIS — Y9389 Activity, other specified: Secondary | ICD-10-CM | POA: Insufficient documentation

## 2014-03-06 DIAGNOSIS — Z792 Long term (current) use of antibiotics: Secondary | ICD-10-CM | POA: Insufficient documentation

## 2014-03-06 DIAGNOSIS — E119 Type 2 diabetes mellitus without complications: Secondary | ICD-10-CM | POA: Insufficient documentation

## 2014-03-06 LAB — COMPREHENSIVE METABOLIC PANEL
ALT: 18 U/L (ref 0–53)
AST: 23 U/L (ref 0–37)
Albumin: 3.8 g/dL (ref 3.5–5.2)
Alkaline Phosphatase: 61 U/L (ref 39–117)
Anion gap: 3 — ABNORMAL LOW (ref 5–15)
BUN: 16 mg/dL (ref 6–23)
CO2: 27 mmol/L (ref 19–32)
Calcium: 8.7 mg/dL (ref 8.4–10.5)
Chloride: 107 mmol/L (ref 96–112)
Creatinine, Ser: 0.93 mg/dL (ref 0.50–1.35)
GLUCOSE: 349 mg/dL — AB (ref 70–99)
Potassium: 4.4 mmol/L (ref 3.5–5.1)
SODIUM: 137 mmol/L (ref 135–145)
Total Bilirubin: 0.7 mg/dL (ref 0.3–1.2)
Total Protein: 6.5 g/dL (ref 6.0–8.3)

## 2014-03-06 LAB — CBC WITH DIFFERENTIAL/PLATELET
BASOS ABS: 0 10*3/uL (ref 0.0–0.1)
BASOS PCT: 0 % (ref 0–1)
EOS PCT: 1 % (ref 0–5)
Eosinophils Absolute: 0.1 10*3/uL (ref 0.0–0.7)
HCT: 36.2 % — ABNORMAL LOW (ref 39.0–52.0)
HEMOGLOBIN: 13.2 g/dL (ref 13.0–17.0)
LYMPHS ABS: 2.3 10*3/uL (ref 0.7–4.0)
Lymphocytes Relative: 41 % (ref 12–46)
MCH: 32.8 pg (ref 26.0–34.0)
MCHC: 36.5 g/dL — AB (ref 30.0–36.0)
MCV: 90 fL (ref 78.0–100.0)
MONO ABS: 0.3 10*3/uL (ref 0.1–1.0)
MONOS PCT: 6 % (ref 3–12)
NEUTROS PCT: 51 % (ref 43–77)
Neutro Abs: 2.9 10*3/uL (ref 1.7–7.7)
PLATELETS: 119 10*3/uL — AB (ref 150–400)
RBC: 4.02 MIL/uL — ABNORMAL LOW (ref 4.22–5.81)
RDW: 13.2 % (ref 11.5–15.5)
WBC: 5.7 10*3/uL (ref 4.0–10.5)

## 2014-03-06 MED ORDER — SODIUM CHLORIDE 0.9 % IV SOLN
Freq: Once | INTRAVENOUS | Status: AC
  Administered 2014-03-06: 21:00:00 via INTRAVENOUS

## 2014-03-06 MED ORDER — IOHEXOL 300 MG/ML  SOLN
100.0000 mL | Freq: Once | INTRAMUSCULAR | Status: AC | PRN
  Administered 2014-03-06: 100 mL via INTRAVENOUS

## 2014-03-06 MED ORDER — HYDROMORPHONE HCL 1 MG/ML IJ SOLN
1.0000 mg | Freq: Once | INTRAMUSCULAR | Status: AC
  Administered 2014-03-06: 1 mg via INTRAVENOUS
  Filled 2014-03-06: qty 1

## 2014-03-06 MED ORDER — HYDROCODONE-ACETAMINOPHEN 5-325 MG PO TABS: 1.0000 | ORAL_TABLET | Freq: Four times a day (QID) | ORAL | Status: DC | PRN
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
## 2014-03-06 NOTE — ED Notes (Signed)
Patient verbalizes understanding of discharge instructions, prescription medications, home care and follow up care. Patient ambulatory out of department at this time, ambulatory home.
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
## 2014-03-06 NOTE — ED Notes (Signed)
Patient sitting up in chair at this time. A&OX4 at this time. Patient states head, neck and back pain.

## 2014-03-06 NOTE — Discharge Instructions (Signed)
Follow up with your md this week for recheck

## 2014-03-13 ENCOUNTER — Encounter (HOSPITAL_COMMUNITY): Payer: Self-pay | Admitting: *Deleted

## 2014-03-13 ENCOUNTER — Emergency Department (HOSPITAL_COMMUNITY)
Admission: EM | Admit: 2014-03-13 | Discharge: 2014-03-13 | Disposition: A | Discharge status: Home / Self Care | Payer: Medicaid - Out of State | Attending: Emergency Medicine | Admitting: Emergency Medicine

## 2014-03-13 DIAGNOSIS — Z791 Long term (current) use of non-steroidal anti-inflammatories (NSAID): Secondary | ICD-10-CM | POA: Insufficient documentation

## 2014-03-13 DIAGNOSIS — Z792 Long term (current) use of antibiotics: Secondary | ICD-10-CM | POA: Insufficient documentation

## 2014-03-13 DIAGNOSIS — M545 Low back pain: Secondary | ICD-10-CM | POA: Diagnosis present

## 2014-03-13 DIAGNOSIS — E119 Type 2 diabetes mellitus without complications: Secondary | ICD-10-CM | POA: Diagnosis not present

## 2014-03-13 DIAGNOSIS — S20211D Contusion of right front wall of thorax, subsequent encounter: Secondary | ICD-10-CM

## 2014-03-13 DIAGNOSIS — M1990 Unspecified osteoarthritis, unspecified site: Secondary | ICD-10-CM | POA: Insufficient documentation

## 2014-03-13 DIAGNOSIS — Z794 Long term (current) use of insulin: Secondary | ICD-10-CM | POA: Insufficient documentation

## 2014-03-13 DIAGNOSIS — Z7982 Long term (current) use of aspirin: Secondary | ICD-10-CM | POA: Diagnosis not present

## 2014-03-13 DIAGNOSIS — I10 Essential (primary) hypertension: Secondary | ICD-10-CM | POA: Diagnosis not present

## 2014-03-13 MED ORDER — METHOCARBAMOL 500 MG PO TABS
1000.0000 mg | ORAL_TABLET | Freq: Once | ORAL | Status: AC
  Administered 2014-03-13: 1000 mg via ORAL
  Filled 2014-03-13: qty 2

## 2014-03-13 MED ORDER — NAPROXEN 250 MG PO TABS
500.0000 mg | ORAL_TABLET | Freq: Once | ORAL | Status: AC
  Administered 2014-03-13: 500 mg via ORAL
  Filled 2014-03-13: qty 2

## 2014-03-13 MED ORDER — METHOCARBAMOL 500 MG PO TABS: 1000.0000 mg | ORAL_TABLET | Freq: Four times a day (QID) | ORAL | Status: AC

## 2014-03-13 MED ORDER — NAPROXEN 500 MG PO TABS: 500.0000 mg | ORAL_TABLET | Freq: Two times a day (BID) | ORAL | Status: DC

## 2014-03-13 NOTE — Discharge Instructions (Signed)
Use the medicines prescribed in addition to continuing to use heat therapy 20 minutes 3-4 times daily.  Plan to see your orthopedist in [place] for a recheck if not improving with this treatment.
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
## 2014-04-17 ENCOUNTER — Encounter (HOSPITAL_COMMUNITY): Payer: Self-pay

## 2014-04-17 ENCOUNTER — Emergency Department (HOSPITAL_COMMUNITY)
Admission: EM | Admit: 2014-04-17 | Discharge: 2014-04-17 | Disposition: A | Discharge status: Home / Self Care | Payer: Medicaid - Out of State | Attending: Emergency Medicine | Admitting: Emergency Medicine

## 2014-04-17 DIAGNOSIS — G479 Sleep disorder, unspecified: Secondary | ICD-10-CM | POA: Insufficient documentation

## 2014-04-17 DIAGNOSIS — M75101 Unspecified rotator cuff tear or rupture of right shoulder, not specified as traumatic: Secondary | ICD-10-CM | POA: Insufficient documentation

## 2014-04-17 DIAGNOSIS — Z7982 Long term (current) use of aspirin: Secondary | ICD-10-CM | POA: Insufficient documentation

## 2014-04-17 DIAGNOSIS — Z79899 Other long term (current) drug therapy: Secondary | ICD-10-CM | POA: Insufficient documentation

## 2014-04-17 DIAGNOSIS — G8929 Other chronic pain: Secondary | ICD-10-CM

## 2014-04-17 DIAGNOSIS — M25511 Pain in right shoulder: Secondary | ICD-10-CM | POA: Insufficient documentation

## 2014-04-17 DIAGNOSIS — I10 Essential (primary) hypertension: Secondary | ICD-10-CM | POA: Diagnosis not present

## 2014-04-17 DIAGNOSIS — E119 Type 2 diabetes mellitus without complications: Secondary | ICD-10-CM | POA: Insufficient documentation

## 2014-04-17 DIAGNOSIS — Z794 Long term (current) use of insulin: Secondary | ICD-10-CM | POA: Insufficient documentation

## 2014-04-17 DIAGNOSIS — M1990 Unspecified osteoarthritis, unspecified site: Secondary | ICD-10-CM | POA: Diagnosis not present

## 2014-04-17 MED ORDER — PROMETHAZINE HCL 12.5 MG PO TABS
12.5000 mg | ORAL_TABLET | Freq: Once | ORAL | Status: AC
  Administered 2014-04-17: 12.5 mg via ORAL
  Filled 2014-04-17: qty 1

## 2014-04-17 MED ORDER — HYDROCODONE-ACETAMINOPHEN 5-325 MG PO TABS
2.0000 | ORAL_TABLET | Freq: Once | ORAL | Status: AC
  Administered 2014-04-17: 2 via ORAL
  Filled 2014-04-17: qty 2

## 2014-04-17 NOTE — ED Notes (Signed)
Pt reports needs surgery to R shoulder and for the past 2 days has had worsening pain.

## 2014-04-17 NOTE — Discharge Instructions (Signed)
Heating pad to your shoulder may be helpful. Please discuss your pain with your surgeon. You may want to attend Shoulder Pain The shoulder is the joint that connects your arm to your body. Muscles and band-like tissues that connect bones to muscles (tendons) hold the joint together. Shoulder pain is felt if an injury or medical problem affects one or more parts of the shoulder. HOME CARE   Put ice on the sore area.  Put ice in a plastic bag.  Place a towel between your skin and the bag.  Leave the ice on for 15-20 minutes, 03-04 times a day for the first 2 days.  Stop using cold packs if they do not help with the pain.  If you were given something to keep your shoulder from moving (sling; shoulder immobilizer), wear it as told. Only take it off to shower or bathe.  Move your arm as little as possible, but keep your hand moving to prevent puffiness (swelling).  Squeeze a soft ball or foam pad as much as possible to help prevent swelling.  Take medicine as told by your doctor. GET HELP IF:  You have progressing new pain in your arm, hand, or fingers.  Your hand or fingers get cold.  Your medicine does not help lessen your pain. GET HELP RIGHT AWAY IF:   Your arm, hand, or fingers are numb or tingling.  Your arm, hand, or fingers are puffy (swollen), painful, or turn white or blue. MAKE SURE YOU:   Understand these instructions.  Will watch your condition.  Will get help right away if you are not doing well or get worse. Document Released: 06/19/2007 Document Revised: 05/17/2013 Document Reviewed: 07/15/2011 [facility] Patient Information 2015 [place], [place]. This information is not intended to replace advice given to you by your health care provider. Make sure you discuss any questions you have with your health care provider.  one of the local clinics to help you with your pain management until you have your surgery.

## 2014-04-17 NOTE — ED Provider Notes (Signed)
CSN: [id number]     Arrival date & time 04/17/14  1739 History  This chart was scribed for non-physician practitioner Lily Kocher, PA-C working with Daleen Bo, MD by Hilda Lias, ED Scribe. This patient was seen in room APFT20/APFT20 and the patient's care was started at 7:07 PM.    Chief Complaint  Patient presents with  . Shoulder Pain     The history is provided by the patient. No language interpreter was used.     HPI Comments: [name] is a 59 y.o. male who presents to the Emergency Department complaining of constant worsening right shoulder pain that has been present for two days. Pt notes that he received an MRI on 03/23/14 in 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 Pt states that he has not been able to sleep the past two days because his pain is so severe and says he is due for surgery some time soon on the affected shoulder.     Past Medical History  Diagnosis Date  . Diabetes mellitus without complication   . Hypertension   . Arthritis    Past Surgical History  Procedure Laterality Date  . Joint replacement    . Back surgery    . Cholecystectomy    . Carpal tunnel release    . Foot surgery    . Total knee arthroplasty    . Shoulder arthroscopy     Family History  Problem Relation Age of Onset  . Heart failure Mother   . Cancer Father    History  Substance Use Topics  . Smoking status: Never Smoker   . Smokeless tobacco: Never Used  . Alcohol Use: No    Review of Systems  Musculoskeletal: Positive for arthralgias.  Psychiatric/Behavioral: Positive for sleep disturbance.  All other systems reviewed and are negative.     Allergies  Other; Pravastatin; Prednisone; Toradol; and Ultram  Home Medications   Prior to Admission  medications   Medication Sig Start Date End Date Taking? Authorizing Provider  aspirin EC 81 MG tablet Take 81 mg by mouth daily.   Yes Historical Provider, MD  diclofenac (VOLTAREN) 75 MG EC tablet Take 75 mg by mouth 2 (two) times daily with a meal. 02/27/14  Yes Historical Provider, MD  HUMULIN R 100 UNIT/ML injection Inject 5-20 Units into the skin 3 (three) times daily with meals as needed. Based on sliding scale 10/15/13  Yes Historical Provider, MD  hydrochlorothiazide (HYDRODIURIL) 25 MG tablet Take 25 mg by mouth daily. 09/02/13  Yes Historical Provider, MD  LEVEMIR FLEXTOUCH 100 UNIT/ML Pen Inject 80 Units into the skin 2 (two) times daily. 10/08/13  Yes Historical Provider, MD  metoprolol (LOPRESSOR) 100 MG tablet Take 100 mg by mouth 2 (two) times daily. 09/02/13  Yes Historical Provider, MD  doxycycline (VIBRAMYCIN) 100 MG capsule Take 1 capsule (100 mg total) by mouth 2 (two) times daily. Patient not taking: Reported on 04/17/2014 02/26/14   Lily Kocher, PA-C  HYDROcodone-acetaminophen (NORCO/VICODIN) 5-325 MG per tablet Take 1 tablet by mouth every 6 (six) hours as needed. Patient not taking: Reported on 03/13/2014 03/06/14   Milton Ferguson, MD  naproxen (NAPROSYN) 500 MG tablet Take 1 tablet (500 mg total) by mouth 2 (two) times daily. Patient not taking: Reported on 04/17/2014 03/13/14   Evalee Jefferson, PA-C  oxyCODONE-acetaminophen (PERCOCET) 5-325 MG per tablet Take 1-2 tablets by mouth every 6 (six) hours as needed. Patient not taking: Reported on 03/06/2014 02/20/14   Anderson Malta Piepenbrink, PA-C   BP 187/110 mmHg  Pulse 96  Temp(Src) 98.2 F (36.8 C) (Oral)  Resp 16  Ht 6' 3"  (1.905 m)  Wt 229 lb (103.874 kg)  BMI 28.62 kg/m2  SpO2 100% Physical Exam  Constitutional: He is oriented to person, place, and time. He appears well-developed and well-nourished.  HENT:  Head: Normocephalic and atraumatic.  Neck:  No carotid bruit   Cardiovascular: Normal rate, regular rhythm and normal heart  sounds.   Pulmonary/Chest: Effort normal.  Abdominal: He exhibits no distension.  Musculoskeletal:  No atrophy of the thenar imminence  Radial pulses 2+ Capillary refill < 2 seconds   Tenderness over biceps area on right arm No evidence for dislocation of right shoulder Shoulder is not hot  Difficulty with ROM   Pain to palpation of anterior right shoulder  Neurological: He is alert and oriented to person, place, and time.  Decreased grip on right hand  Skin: Skin is warm and dry.  Psychiatric: He has a normal mood and affect.  Nursing note and vitals reviewed.   ED Course  Procedures (including critical care time)  DIAGNOSTIC STUDIES: Oxygen Saturation is 100% on room air, normal by my interpretation.    COORDINATION OF CARE: 7:14 PM Discussed treatment plan with pt at bedside and pt agreed to plan.   Labs Review Labs Reviewed - No data to display  Imaging Review No results found.   EKG Interpretation None      MDM  Patient has a history of chronic right shoulder pain. He has rotator cuff tears. He is scheduled for surgery on, but states he is having pain that at times is keeping him from resting and he is seeking assistance with this. He states that his surgeon will not treat the pain, and he does not have a primary care physician.  No gross neurologic or vascular deficits were appreciated on examination. No evidence of any septic joint was noted. I discussed with the patient that the emergency department is happy to manage and handle emergent situations, but are not set up for pain management. The patient was treated with 10 mg of hydrocodone here in the department. The patient states that he is taking ibuprofen as an outpatient. No additional NSAIDs were offered due to him taking this particular medication. I attempted to offer the patient resources of some of the local clinics, but he says that he has tried them and they will not take [place]. I have asked  the patient to speak with the social worker in the [place] area that may be able to help him with his dilemma.    Final diagnoses:  None    *I have reviewed nursing notes, vital signs, and all appropriate lab and imaging results for this patient.**  **I personally performed the services described in this documentation, which was scribed in my presence. The recorded information has been reviewed and is accurate.Lily Kocher, PA-C 04/17/14 1941  Daleen Bo, MD 04/17/14 [id number]

## 2015-03-21 ENCOUNTER — Emergency Department (HOSPITAL_COMMUNITY)
Admission: EM | Admit: 2015-03-21 | Discharge: 2015-03-22 | Discharge status: Against Medical Advice | Payer: Medicaid - Out of State | Attending: Emergency Medicine | Admitting: Emergency Medicine

## 2015-03-21 ENCOUNTER — Emergency Department (HOSPITAL_COMMUNITY): Payer: Medicaid - Out of State

## 2015-03-21 ENCOUNTER — Encounter (HOSPITAL_COMMUNITY): Payer: Self-pay | Admitting: *Deleted

## 2015-03-21 DIAGNOSIS — E119 Type 2 diabetes mellitus without complications: Secondary | ICD-10-CM | POA: Insufficient documentation

## 2015-03-21 DIAGNOSIS — S37012A Minor contusion of left kidney, initial encounter: Secondary | ICD-10-CM | POA: Diagnosis not present

## 2015-03-21 DIAGNOSIS — S3991XA Unspecified injury of abdomen, initial encounter: Secondary | ICD-10-CM | POA: Insufficient documentation

## 2015-03-21 DIAGNOSIS — Y999 Unspecified external cause status: Secondary | ICD-10-CM | POA: Diagnosis not present

## 2015-03-21 DIAGNOSIS — Z7982 Long term (current) use of aspirin: Secondary | ICD-10-CM | POA: Diagnosis not present

## 2015-03-21 DIAGNOSIS — S0990XA Unspecified injury of head, initial encounter: Secondary | ICD-10-CM | POA: Diagnosis present

## 2015-03-21 DIAGNOSIS — I10 Essential (primary) hypertension: Secondary | ICD-10-CM | POA: Insufficient documentation

## 2015-03-21 DIAGNOSIS — S8002XA Contusion of left knee, initial encounter: Secondary | ICD-10-CM | POA: Insufficient documentation

## 2015-03-21 DIAGNOSIS — T07XXXA Unspecified multiple injuries, initial encounter: Secondary | ICD-10-CM

## 2015-03-21 DIAGNOSIS — R000 Tachycardia, unspecified: Secondary | ICD-10-CM | POA: Insufficient documentation

## 2015-03-21 DIAGNOSIS — W15XXXA Fall from cliff, initial encounter: Secondary | ICD-10-CM | POA: Diagnosis not present

## 2015-03-21 DIAGNOSIS — Y9389 Activity, other specified: Secondary | ICD-10-CM | POA: Diagnosis not present

## 2015-03-21 DIAGNOSIS — M25552 Pain in left hip: Secondary | ICD-10-CM

## 2015-03-21 DIAGNOSIS — Z79899 Other long term (current) drug therapy: Secondary | ICD-10-CM | POA: Diagnosis not present

## 2015-03-21 DIAGNOSIS — S80212A Abrasion, left knee, initial encounter: Secondary | ICD-10-CM | POA: Insufficient documentation

## 2015-03-21 DIAGNOSIS — Z791 Long term (current) use of non-steroidal anti-inflammatories (NSAID): Secondary | ICD-10-CM | POA: Insufficient documentation

## 2015-03-21 DIAGNOSIS — S70212A Abrasion, left hip, initial encounter: Secondary | ICD-10-CM | POA: Diagnosis not present

## 2015-03-21 DIAGNOSIS — M25562 Pain in left knee: Secondary | ICD-10-CM

## 2015-03-21 DIAGNOSIS — M1990 Unspecified osteoarthritis, unspecified site: Secondary | ICD-10-CM | POA: Insufficient documentation

## 2015-03-21 DIAGNOSIS — R0781 Pleurodynia: Secondary | ICD-10-CM

## 2015-03-21 DIAGNOSIS — Y9289 Other specified places as the place of occurrence of the external cause: Secondary | ICD-10-CM | POA: Diagnosis not present

## 2015-03-21 DIAGNOSIS — S0081XA Abrasion of other part of head, initial encounter: Secondary | ICD-10-CM | POA: Diagnosis not present

## 2015-03-21 DIAGNOSIS — S299XXA Unspecified injury of thorax, initial encounter: Secondary | ICD-10-CM | POA: Insufficient documentation

## 2015-03-21 DIAGNOSIS — W19XXXA Unspecified fall, initial encounter: Secondary | ICD-10-CM

## 2015-03-21 LAB — I-STAT CHEM 8, ED
BUN: 34 mg/dL — AB (ref 6–20)
CALCIUM ION: 1.11 mmol/L — AB (ref 1.12–1.23)
CHLORIDE: 96 mmol/L — AB (ref 101–111)
Creatinine, Ser: 1.1 mg/dL (ref 0.61–1.24)
Glucose, Bld: 258 mg/dL — ABNORMAL HIGH (ref 65–99)
HCT: 35 % — ABNORMAL LOW (ref 39.0–52.0)
Hemoglobin: 11.9 g/dL — ABNORMAL LOW (ref 13.0–17.0)
POTASSIUM: 3.7 mmol/L (ref 3.5–5.1)
Sodium: 135 mmol/L (ref 135–145)
TCO2: 27 mmol/L (ref 0–100)

## 2015-03-21 LAB — CBG MONITORING, ED: Glucose-Capillary: 302 mg/dL — ABNORMAL HIGH (ref 65–99)

## 2015-03-21 MED ORDER — FENTANYL CITRATE (PF) 100 MCG/2ML IJ SOLN
50.0000 ug | Freq: Once | INTRAMUSCULAR | Status: AC
  Administered 2015-03-21: 50 ug via INTRAVENOUS
  Filled 2015-03-21: qty 2

## 2015-03-21 NOTE — ED Provider Notes (Signed)
CSN: [id number]     Arrival date & time 03/21/15  1423 History   First MD Initiated Contact with Patient 03/21/15 2038     Chief Complaint  Patient presents with  . Fall     (Consider location/radiation/quality/duration/timing/severity/associated sxs/prior Treatment) The history is provided by the patient and medical records. No language interpreter was used.     [name] is a 60 y.o. male  with a hx of IDDM, HTN, arthritis presents to the Emergency Department complaining of left knee pain and myalgias after 22f fall from a clif around 12:00 yesterday.  Pt reports he hit his head and left side.  Pt reports significant pain in his left chest with movement and deep breathing.  Left knee pain; hx of replacement; now with difficulty walking.  Pt reports persistent headache since the fall with tenderness to the fore head.  Pt has taken aleve and ibuprofen without relief.  He also c/o blurry vision in the left eye.  Unknown last tetanus.   Past Medical History  Diagnosis Date  . Diabetes mellitus without complication ([place]   . Hypertension   . Arthritis    Past Surgical History  Procedure Laterality Date  . Joint replacement    . Back surgery    . Cholecystectomy    . Carpal tunnel release    . Foot surgery    . Total knee arthroplasty    . Shoulder arthroscopy     Family History  Problem Relation Age of Onset  . Heart failure Mother   . Cancer Father    Social History  Substance Use Topics  . Smoking status: Never Smoker   . Smokeless tobacco: Never Used  . Alcohol Use: No    Review of Systems  Constitutional: Negative for fever, diaphoresis, appetite change, fatigue and unexpected weight change.  HENT: Negative for mouth sores.   Eyes: Negative for visual disturbance.  Respiratory: Negative for cough, chest tightness, shortness of breath and wheezing.   Cardiovascular: Positive for chest pain (left ribs).  Gastrointestinal: Negative for nausea, vomiting, abdominal  pain, diarrhea and constipation.  Endocrine: Negative for polydipsia, polyphagia and polyuria.  Genitourinary: Negative for dysuria, urgency, frequency and hematuria.  Musculoskeletal: Positive for joint swelling, arthralgias (left knee) and gait problem. Negative for back pain and neck stiffness.  Skin: Positive for wound. Negative for rash.  Allergic/Immunologic: Negative for immunocompromised state.  Neurological: Negative for syncope, light-headedness and headaches.  Hematological: Does not bruise/bleed easily.  Psychiatric/Behavioral: Negative for sleep disturbance. The patient is not nervous/anxious.       Allergies  Other; Pravastatin; Prednisone; Toradol; and Ultram  Home Medications   Prior to Admission medications   Medication Sig Start Date End Date Taking? Authorizing Provider  aspirin EC 81 MG tablet Take 81 mg by mouth daily.    Historical Provider, MD  diclofenac (VOLTAREN) 75 MG EC tablet Take 75 mg by mouth 2 (two) times daily with a meal. 02/27/14   Historical Provider, MD  doxycycline (VIBRAMYCIN) 100 MG capsule Take 1 capsule (100 mg total) by mouth 2 (two) times daily. Patient not taking: Reported on 04/17/2014 02/26/14   HLily Kocher PA-C  HUMULIN R 100 UNIT/ML injection Inject 5-20 Units into the skin 3 (three) times daily with meals as needed. Based on sliding scale 10/15/13   Historical Provider, MD  hydrochlorothiazide (HYDRODIURIL) 25 MG tablet Take 25 mg by mouth daily. 09/02/13   Historical Provider, MD  HYDROcodone-acetaminophen (NORCO/VICODIN) 5-325 MG tablet Take 1-2 tablets by  mouth every 6 (six) hours as needed for moderate pain or severe pain. 03/22/15   Artasia Thang, PA-C  LEVEMIR FLEXTOUCH 100 UNIT/ML Pen Inject 80 Units into the skin 2 (two) times daily. 10/08/13   Historical Provider, MD  methocarbamol (ROBAXIN) 500 MG tablet Take 1 tablet (500 mg total) by mouth 2 (two) times daily. 03/22/15   Latoria Dry, PA-C  metoprolol (LOPRESSOR) 100 MG  tablet Take 100 mg by mouth 2 (two) times daily. 09/02/13   Historical Provider, MD  naproxen (NAPROSYN) 500 MG tablet Take 1 tablet (500 mg total) by mouth 2 (two) times daily. Patient not taking: Reported on 04/17/2014 03/13/14   Evalee Jefferson, PA-C  oxyCODONE-acetaminophen (PERCOCET) 5-325 MG per tablet Take 1-2 tablets by mouth every 6 (six) hours as needed. Patient not taking: Reported on 03/06/2014 02/20/14   Anderson Malta Piepenbrink, PA-C   BP 116/102 mmHg  Pulse 110  Temp(Src) 98.3 F (36.8 C) (Oral)  Resp 16  SpO2 96% Physical Exam  Constitutional: He is oriented to person, place, and time. He appears well-developed and well-nourished. No distress.  HENT:  Head: Normocephalic. Head is with abrasion.    Right Ear: Tympanic membrane, external ear and ear canal normal.  Left Ear: Tympanic membrane, external ear and ear canal normal.  Nose: Nose normal. No epistaxis. Right sinus exhibits no maxillary sinus tenderness and no frontal sinus tenderness. Left sinus exhibits no maxillary sinus tenderness and no frontal sinus tenderness.  Mouth/Throat: Uvula is midline, oropharynx is clear and moist and mucous membranes are normal. Mucous membranes are not pale and not cyanotic. No oropharyngeal exudate, posterior oropharyngeal edema, posterior oropharyngeal erythema or tonsillar abscesses.  Multiple abrasions to the left side of the face  Eyes: Conjunctivae and EOM are normal. Pupils are equal, round, and reactive to light.  Full EOMs Visual Acuity - Bilateral Near: 20/20 ; R Near: 20/20 ; L Near: 20/20  Neck: Normal range of motion and full passive range of motion without pain. No spinous process tenderness and no muscular tenderness present. No rigidity. Normal range of motion present.  Full ROM without pain No midline cervical tenderness No crepitus, deformity or step-offs No paraspinal tenderness  Cardiovascular: Regular rhythm, normal heart sounds and intact distal pulses.  Tachycardia present.    Pulses:      Radial pulses are 2+ on the right side, and 2+ on the left side.       Dorsalis pedis pulses are 2+ on the right side, and 2+ on the left side.       Posterior tibial pulses are 2+ on the right side, and 2+ on the left side.  Capillary refill < 3 sec  Pulmonary/Chest: Effort normal and breath sounds normal. No accessory muscle usage or stridor. No respiratory distress. He has no decreased breath sounds. He has no wheezes. He has no rhonchi. He has no rales. He exhibits tenderness. He exhibits no bony tenderness.  No contusions No flail segment, crepitus or deformity Equal chest expansion Clear and equal breath sounds Tenderness to palpation along the left lateral ribs  Abdominal: Soft. Normal appearance and bowel sounds are normal. There is tenderness in the left upper quadrant. There is no rigidity, no rebound, no guarding and no CVA tenderness.  No contusions or ecchymosis Abd soft with minimal tenderness in the left upper quadrant No CVA tenderness No rigidity, rebound or guarding  Musculoskeletal: Normal range of motion.       Thoracic back: He exhibits normal range of motion.  Lumbar back: He exhibits normal range of motion.  Full range of motion of the T-spine and L-spine No tenderness to palpation of the spinous processes of the T-spine or L-spine No crepitus, deformity or step-offs No tenderness to palpation of the paraspinous muscles of the L-spine  Tenderness to palpation along the greater trochanter of the left hip; small abrasion noted; full passive range of motion with moderate pain  Tenderness to palpation along the medial joint line of the left knee; swelling and ecchymosis along with several abrasions noted; no erythema or increased warmth; full extension, decreased flexion due to pain and swelling  Lymphadenopathy:    He has no cervical adenopathy.  Neurological: He is alert and oriented to person, place, and time. He has normal reflexes. No cranial  nerve deficit. GCS eye subscore is 4. GCS verbal subscore is 5. GCS motor subscore is 6.  Reflex Scores:      Bicep reflexes are 2+ on the right side and 2+ on the left side.      Brachioradialis reflexes are 2+ on the right side and 2+ on the left side.      Patellar reflexes are 2+ on the right side and 2+ on the left side.      Achilles reflexes are 2+ on the right side and 2+ on the left side. Speech is clear and goal oriented, follows commands Normal 5/5 strength in upper and lower extremities bilaterally including dorsiflexion and plantar flexion, strong and equal grip strength Sensation normal to light and sharp touch Moves extremities without ataxia, coordination intact Antalgic gait and balance with significant limp due to knee pain No Clonus  Skin: Skin is warm and dry. No rash noted. He is not diaphoretic. No erythema.  Psychiatric: He has a normal mood and affect.  Nursing note and vitals reviewed.   ED Course  Procedures (including critical care time) Labs Review Labs Reviewed  CBG MONITORING, ED - Abnormal; Notable for the following:    Glucose-Capillary 302 (*)    All other components within normal limits  I-STAT CHEM 8, ED - Abnormal; Notable for the following:    Chloride 96 (*)    BUN 34 (*)    Glucose, Bld 258 (*)    Calcium, Ion 1.11 (*)    Hemoglobin 11.9 (*)    HCT 35.0 (*)    All other components within normal limits  URINALYSIS, ROUTINE W REFLEX MICROSCOPIC (NOT AT [facility])    Imaging Review Ct Head Wo Contrast  03/22/2015  CLINICAL DATA:  Initial evaluation for acute trauma, fall. EXAM: CT HEAD WITHOUT CONTRAST CT MAXILLOFACIAL WITHOUT CONTRAST CT CERVICAL SPINE WITHOUT CONTRAST TECHNIQUE: Multidetector CT imaging of the head, cervical spine, and maxillofacial structures were performed using the standard protocol without intravenous contrast. Multiplanar CT image reconstructions of the cervical spine and maxillofacial structures were also generated.  COMPARISON:  None. FINDINGS: CT HEAD FINDINGS Scalp soft tissues within normal limits. No acute abnormality about the orbits. Mild mucosal thickening within the ethmoidal air cells and maxillary sinuses. Visualized paranasal sinuses are otherwise clear. No mastoid effusion. Calvarium intact. No extra-axial fluid collection. No acute intracranial hemorrhage. No acute large vessel territory infarct. Scattered vascular calcifications within the carotid siphons. 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 No mass lesion, midline shift or mass effect. No hydrocephalus. Mild atrophy with chronic small vessel ischemic disease. CT MAXILLOFACIAL FINDINGS No significant soft tissue swelling identified within the face. Globes intact. No retro-orbital hematoma or  other pathology. Bony orbits intact. No orbital floor fracture. Zygomatic arches intact. No maxillary fracture. Pterygoid plates intact. No acute nasal bone fracture. Nasal septum midline and intact. No acute mandibular fracture. Mandibular condyles normally situated within the temporomandibular fossa. No acute abnormality about the dentition. Mild mucosal thickening within the ethmoidal air cells and maxillary sinuses. Paranasal sinuses are otherwise clear. CT CERVICAL SPINE FINDINGS The vertebral bodies are normally aligned with preservation of the normal cervical lordosis. Vertebral body heights are preserved. Normal C1-2 articulations are intact. No prevertebral soft tissue swelling. No acute fracture or listhesis. Moderate degenerative spondylolysis as evidenced by intervertebral disc space narrowing with endplate sclerosis and osteophytosis present at C5-6 and C6-7. Degenerative calcification present at the tectorial membrane. Degenerative changes at the anterior C1-2 articulation. Visualized soft tissues demonstrate no acute abnormality. Scattered vascular calcifications about the carotid bifurcations. Visualized lung apices  are clear without evidence of apical pneumothorax. IMPRESSION: CT HEAD: 1. No acute intracranial process. 2. Mild age-related cerebral atrophy with chronic small vessel ischemic disease. CT MAXILLOFACIAL: No acute traumatic maxillofacial injury. CT CERVICAL SPINE: No acute traumatic injury within the cervical spine. Electronically Signed   By: Jeannine Boga M.D.   On: 03/22/2015 00:36   Ct Chest W Contrast  03/22/2015  CLINICAL DATA:  Fall 18 feet, now with left-sided chest and back pain. EXAM: CT CHEST, ABDOMEN, AND PELVIS WITH CONTRAST TECHNIQUE: Multidetector CT imaging of the chest, abdomen and pelvis was performed following the standard protocol during bolus administration of intravenous contrast. CONTRAST:  100 mL Omnipaque 300 IV COMPARISON:  CT of the chest abdomen pelvis 03/06/2014 FINDINGS: CT CHEST FINDINGS No acute traumatic aortic injury. No mediastinal hematoma. No pleural or pericardial effusion. Coronary artery calcifications versus stent. No pulmonary contusion. Mild linear atelectasis in both lower lobes, right greater than left. No pneumothorax or pneumomediastinum. The sternum is intact. No acute rib fracture. Thoracic spine is intact without fracture. Included clavicle and shoulder girdles intact. Chronic deformity of left proximal humerus. No soft tissue stranding of the chest wall. CT ABDOMEN AND PELVIS FINDINGS Ill-defined rounded heterogeneous region on the lateral left kidney measures approximately 3.7 cm with mild adjacent perinephric edema. No frank linear laceration. No perinephric fluid collection or urinoma. Symmetric renal excretion on delayed phase imaging. Soft tissue edema tracks in the retroperitoneum on the left lateral to the psoas muscle without definite retroperitoneal fluid. Probable punctate nonobstructing right renal stone. No acute traumatic injury to the liver, gallbladder, spleen, pancreas, or adrenal glands. The spleen is enlarged measuring 17.2 cm cranial  caudal. No perisplenic fluid. The stomach is distended with ingested contents. There are no dilated or thickened bowel loops. Mild colonic diverticulosis without diverticulitis. The appendix is normal. No mesenteric hematoma. No free air, free fluid, or intra-abdominal fluid collection. No retroperitoneal fluid. The IVC appears intact. Small retroperitoneal lymph nodes at the level of the left renal artery, not enlarged by size criteria. Abdominal aorta is normal in caliber. Within the pelvis the bladder is physiologically distended without wall thickening. No free fluid in the pelvis. Symmetric soft tissue stranding of the anterior abdominal wall, unchanged from prior, likely secondary to injections. Bony pelvis is intact without fracture. Lumbar spine is intact without fracture. Multilevel degenerative change. IMPRESSION: 1. Rounded heterogeneous region in the lateral left kidney measuring 3.7 cm, mild adjacent perinephric edema and tracking retroperitoneal edema. In the setting of trauma, findings concerning for renal contusion (grade 1 renal injury). Focal infection or less likely renal neoplasm could have  a similar appearance radiographically. Recommend correlation with urinalysis. Follow-up imaging recommended to ensure resolution. 2. There is otherwise no acute traumatic injury to the in chest, abdomen, or pelvis. 3. Incidental note of splenomegaly. These results were called by telephone at the time of interpretation on 03/22/2015 at 12:14 am to [place] , who verbally acknowledged these results. Electronically Signed   By: Jeb Levering M.D.   On: 03/22/2015 00:16   Ct Cervical Spine Wo Contrast  03/22/2015  CLINICAL DATA:  Initial evaluation for acute trauma, fall. EXAM: CT HEAD WITHOUT CONTRAST CT MAXILLOFACIAL WITHOUT CONTRAST CT CERVICAL SPINE WITHOUT CONTRAST TECHNIQUE: Multidetector CT imaging of the head, cervical spine, and maxillofacial structures were performed using the standard  protocol without intravenous contrast. Multiplanar CT image reconstructions of the cervical spine and maxillofacial structures were also generated. COMPARISON:  None. FINDINGS: CT HEAD FINDINGS Scalp soft tissues within normal limits. No acute abnormality about the orbits. Mild mucosal thickening within the ethmoidal air cells and maxillary sinuses. Visualized paranasal sinuses are otherwise clear. No mastoid effusion. Calvarium intact. No extra-axial fluid collection. No acute intracranial hemorrhage. No acute large vessel territory infarct. Scattered vascular calcifications within the carotid siphons. 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 No mass lesion, midline shift or mass effect. No hydrocephalus. Mild atrophy with chronic small vessel ischemic disease. CT MAXILLOFACIAL FINDINGS No significant soft tissue swelling identified within the face. Globes intact. No retro-orbital hematoma or other pathology. Bony orbits intact. No orbital floor fracture. Zygomatic arches intact. No maxillary fracture. Pterygoid plates intact. No acute nasal bone fracture. Nasal septum midline and intact. No acute mandibular fracture. Mandibular condyles normally situated within the temporomandibular fossa. No acute abnormality about the dentition. Mild mucosal thickening within the ethmoidal air cells and maxillary sinuses. Paranasal sinuses are otherwise clear. CT CERVICAL SPINE FINDINGS The vertebral bodies are normally aligned with preservation of the normal cervical lordosis. Vertebral body heights are preserved. Normal C1-2 articulations are intact. No prevertebral soft tissue swelling. No acute fracture or listhesis. Moderate degenerative spondylolysis as evidenced by intervertebral disc space narrowing with endplate sclerosis and osteophytosis present at C5-6 and C6-7. Degenerative calcification present at the tectorial membrane. Degenerative changes at the anterior C1-2 articulation.  Visualized soft tissues demonstrate no acute abnormality. Scattered vascular calcifications about the carotid bifurcations. Visualized lung apices are clear without evidence of apical pneumothorax. IMPRESSION: CT HEAD: 1. No acute intracranial process. 2. Mild age-related cerebral atrophy with chronic small vessel ischemic disease. CT MAXILLOFACIAL: No acute traumatic maxillofacial injury. CT CERVICAL SPINE: No acute traumatic injury within the cervical spine. Electronically Signed   By: Jeannine Boga M.D.   On: 03/22/2015 00:36   Ct Abdomen Pelvis W Contrast  03/22/2015  CLINICAL DATA:  Fall 18 feet, now with left-sided chest and back pain. EXAM: CT CHEST, ABDOMEN, AND PELVIS WITH CONTRAST TECHNIQUE: Multidetector CT imaging of the chest, abdomen and pelvis was performed following the standard protocol during bolus administration of intravenous contrast. CONTRAST:  100 mL Omnipaque 300 IV COMPARISON:  CT of the chest abdomen pelvis 03/06/2014 FINDINGS: CT CHEST FINDINGS No acute traumatic aortic injury. No mediastinal hematoma. No pleural or pericardial effusion. Coronary artery calcifications versus stent. No pulmonary contusion. Mild linear atelectasis in both lower lobes, right greater than left. No pneumothorax or pneumomediastinum. The sternum is intact. No acute rib fracture. Thoracic spine is intact without fracture. Included clavicle and shoulder girdles intact. Chronic deformity of left proximal humerus. No soft tissue stranding  of the chest wall. CT ABDOMEN AND PELVIS FINDINGS Ill-defined rounded heterogeneous region on the lateral left kidney measures approximately 3.7 cm with mild adjacent perinephric edema. No frank linear laceration. No perinephric fluid collection or urinoma. Symmetric renal excretion on delayed phase imaging. Soft tissue edema tracks in the retroperitoneum on the left lateral to the psoas muscle without definite retroperitoneal fluid. Probable punctate nonobstructing right  renal stone. No acute traumatic injury to the liver, gallbladder, spleen, pancreas, or adrenal glands. The spleen is enlarged measuring 17.2 cm cranial caudal. No perisplenic fluid. The stomach is distended with ingested contents. There are no dilated or thickened bowel loops. Mild colonic diverticulosis without diverticulitis. The appendix is normal. No mesenteric hematoma. No free air, free fluid, or intra-abdominal fluid collection. No retroperitoneal fluid. The IVC appears intact. Small retroperitoneal lymph nodes at the level of the left renal artery, not enlarged by size criteria. Abdominal aorta is normal in caliber. Within the pelvis the bladder is physiologically distended without wall thickening. No free fluid in the pelvis. Symmetric soft tissue stranding of the anterior abdominal wall, unchanged from prior, likely secondary to injections. Bony pelvis is intact without fracture. Lumbar spine is intact without fracture. Multilevel degenerative change. IMPRESSION: 1. Rounded heterogeneous region in the lateral left kidney measuring 3.7 cm, mild adjacent perinephric edema and tracking retroperitoneal edema. In the setting of trauma, findings concerning for renal contusion (grade 1 renal injury). Focal infection or less likely renal neoplasm could have a similar appearance radiographically. Recommend correlation with urinalysis. Follow-up imaging recommended to ensure resolution. 2. There is otherwise no acute traumatic injury to the in chest, abdomen, or pelvis. 3. Incidental note of splenomegaly. These results were called by telephone at the time of interpretation on 03/22/2015 at 12:14 am to [place] , who verbally acknowledged these results. Electronically Signed   By: Jeb Levering M.D.   On: 03/22/2015 00:16   Dg Knee Complete 4 Views Left  03/21/2015  CLINICAL DATA:  [place] 20 [place] yesterday while hiking, left side knee pain and swelling, history of knee replacement EXAM: LEFT  KNEE - COMPLETE 4+ VIEW COMPARISON:  None. FINDINGS: Four views of the left knee submitted. No acute fracture or subluxation. There is left knee prosthesis with anatomic alignment. No evidence of prosthesis loosening. Small joint effusion. There is partially visualized old fracture deformity distal left femoral shaft. IMPRESSION: No acute fracture or subluxation. Diffuse osteopenia. Left knee prosthesis with anatomic alignment. Small joint effusion. Partially visualized old fracture deformity distal shaft of left femur. Electronically Signed   By: Lahoma Crocker M.D.   On: 03/21/2015 15:51   Dg Hip Unilat With Pelvis 2-3 Views Left  03/21/2015  CLINICAL DATA:  [place] 18 feet EXAM: DG HIP (WITH OR WITHOUT PELVIS) 2-3V LEFT COMPARISON:  None. FINDINGS: Mild joint space narrowing in the left hip. No fracture of the left hip. Chronic fracture mid left femur. IMPRESSION: Negative for left hip fracture. Electronically Signed   By: Franchot Gallo M.D.   On: 03/21/2015 22:24   Dg Femur Min 2 Views Left  03/21/2015  CLINICAL DATA:  Fall 18 feet EXAM: LEFT FEMUR 2 VIEWS COMPARISON:  None. FINDINGS: Chronic fracture mid femur with deformity and extensive heterotopic bone formation. Mild angulation. No acute fracture Left knee replacement IMPRESSION: Chronic fracture deformity left mid femur.  No acute fracture Electronically Signed   By: Franchot Gallo M.D.   On: 03/21/2015 22:25   Ct Maxillofacial Wo Cm  03/22/2015  CLINICAL DATA:  Initial evaluation for acute trauma, fall. EXAM: CT HEAD WITHOUT CONTRAST CT MAXILLOFACIAL WITHOUT CONTRAST CT CERVICAL SPINE WITHOUT CONTRAST TECHNIQUE: Multidetector CT imaging of the head, cervical spine, and maxillofacial structures were performed using the standard protocol without intravenous contrast. Multiplanar CT image reconstructions of the cervical spine and maxillofacial structures were also generated. COMPARISON:  None. FINDINGS: CT HEAD FINDINGS Scalp soft tissues within normal  limits. No acute abnormality about the orbits. Mild mucosal thickening within the ethmoidal air cells and maxillary sinuses. Visualized paranasal sinuses are otherwise clear. No mastoid effusion. Calvarium intact. No extra-axial fluid collection. No acute intracranial hemorrhage. No acute large vessel territory infarct. Scattered vascular calcifications within the carotid siphons. 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 No mass lesion, midline shift or mass effect. No hydrocephalus. Mild atrophy with chronic small vessel ischemic disease. CT MAXILLOFACIAL FINDINGS No significant soft tissue swelling identified within the face. Globes intact. No retro-orbital hematoma or other pathology. Bony orbits intact. No orbital floor fracture. Zygomatic arches intact. No maxillary fracture. Pterygoid plates intact. No acute nasal bone fracture. Nasal septum midline and intact. No acute mandibular fracture. Mandibular condyles normally situated within the temporomandibular fossa. No acute abnormality about the dentition. Mild mucosal thickening within the ethmoidal air cells and maxillary sinuses. Paranasal sinuses are otherwise clear. CT CERVICAL SPINE FINDINGS The vertebral bodies are normally aligned with preservation of the normal cervical lordosis. Vertebral body heights are preserved. Normal C1-2 articulations are intact. No prevertebral soft tissue swelling. No acute fracture or listhesis. Moderate degenerative spondylolysis as evidenced by intervertebral disc space narrowing with endplate sclerosis and osteophytosis present at C5-6 and C6-7. Degenerative calcification present at the tectorial membrane. Degenerative changes at the anterior C1-2 articulation. Visualized soft tissues demonstrate no acute abnormality. Scattered vascular calcifications about the carotid bifurcations. Visualized lung apices are clear without evidence of apical pneumothorax. IMPRESSION: CT HEAD: 1. No  acute intracranial process. 2. Mild age-related cerebral atrophy with chronic small vessel ischemic disease. CT MAXILLOFACIAL: No acute traumatic maxillofacial injury. CT CERVICAL SPINE: No acute traumatic injury within the cervical spine. Electronically Signed   By: Jeannine Boga M.D.   On: 03/22/2015 00:36   I have personally reviewed and evaluated these images and lab results as part of my medical decision-making.    MDM   Final diagnoses:  Fall  Rib pain on left side  Left knee pain  Left hip pain  Abrasions of multiple sites  Contusion of kidney, left, initial encounter   [facility] resents with approximately 20 foot fall greater than 24 hours ago. Patient presents with left knee pain, left hip pain, left side and rib pain.  Abdomen is soft without rebound or guarding.  Sounds are clear and equal with equal chest rise.  CT head face and neck without acute traumatic injuries. Patient complains of blurry vision to the left eye however vision is 20/20 in both eyes on exam and he has no orbital floor fracture. No diplopia.  CT scan of the chest and abdomen show heterogeneous region in the lateral left kidney measuring 3.7 cm and perinephric edema tracking into the retroperitoneal space. This is concerning for a grade 1 renal contusion.  I discussed this with Dr. Marisue Humble of radiology.    Abrasions noted to the face and left knee. Tetanus updated. Plain films of the hip and knee show no acute abnormalities. Knee sleeve placed.  X-ray of the left femur shows a chronic, healed deformity.  No acute changes.    The patient was discussed with and seen by Dr. Winfred Leeds who agrees with the treatment plan.  1:06 AM Patient has provided a urine sample at this time. I personally visualized the sample. There is no gross blood. Patient wishes to leave AMA. I discussed the findings of his CT scan concerning for potential renal contusion versus small potential for renal neoplasm.  I  recommended the patient stay for results of his urinalysis and a discussion between myself and the trauma surgeon on call however patient refuses to do so. He states he will not be admitted no matter what and he is ready to go home.  I expressed concern about the potential renal contusion and the need for close follow-up to rule out renal neoplasm. He states understanding. He reports he will return to the emergency department if he has worsening pain, new symptoms or hematuria. He reports he does not have a primary care physician. Phone number given to assist with obtaining a primary care physician for follow-up.   We discussed the nature and purpose, risks and benefits, as well as, the alternatives of treatment. Time was given to allow the opportunity to ask questions and consider their options, and after the discussion, the patient decided to refuse the offerred treatment. The patient was informed that refusal could lead to, but was not limited to, death, permanent disability, or severe pain. No family was present to assist with persuading the patient to stay for results and discussion with trauma surgery. Prior to refusing, I determined that the patient had the capacity to make their decision and understood the consequences of that decision. After refusal, I made every reasonable opportunity to treat them to the best of my ability.  The patient was notified that they may return to the emergency department at any time for further treatment.    1:23 AM After discharge, pt's UA resulted without hematuria.  No evidence of infection.    Jarrett Soho Chariah Bailey, PA-C 03/22/15 0124  Orlie Dakin, MD 03/22/15 1209

## 2015-03-21 NOTE — ED Notes (Signed)
Pt states he was hiking yesterday and slipped on leaves and branches. States he tumbled and fell 18 ft. States he was alone and is unsure if he lost consciousness. Pt has abrasions to left cheek and forehead, states his left eye vision is a little blurry. Also c/o knot to left abd that it tender. And pain to left knee, hx of titanium knee.

## 2015-03-22 ENCOUNTER — Emergency Department (HOSPITAL_COMMUNITY): Payer: Medicaid - Out of State

## 2015-03-22 LAB — URINALYSIS, ROUTINE W REFLEX MICROSCOPIC
Bilirubin Urine: NEGATIVE
Glucose, UA: NEGATIVE mg/dL
Hgb urine dipstick: NEGATIVE
KETONES UR: NEGATIVE mg/dL
LEUKOCYTES UA: NEGATIVE
NITRITE: NEGATIVE
PH: 6 (ref 5.0–8.0)
PROTEIN: NEGATIVE mg/dL
Specific Gravity, Urine: 1.021 (ref 1.005–1.030)

## 2015-03-22 MED ORDER — OXYCODONE-ACETAMINOPHEN 5-325 MG PO TABS
2.0000 | ORAL_TABLET | Freq: Once | ORAL | Status: AC
Start: 2015-03-22 — End: 2015-03-22
  Administered 2015-03-22: 2 via ORAL
  Filled 2015-03-22: qty 2

## 2015-03-22 MED ORDER — METHOCARBAMOL 500 MG PO TABS: 500.0000 mg | ORAL_TABLET | Freq: Two times a day (BID) | ORAL | Status: DC

## 2015-03-22 MED ORDER — HYDROCODONE-ACETAMINOPHEN 5-325 MG PO TABS: 1.0000 | ORAL_TABLET | Freq: Four times a day (QID) | ORAL | Status: DC | PRN

## 2015-03-22 MED ORDER — MORPHINE SULFATE (PF) 4 MG/ML IV SOLN
4.0000 mg | Freq: Once | INTRAVENOUS | Status: AC
Start: 2015-03-22 — End: 2015-03-22
  Administered 2015-03-22: 4 mg via INTRAVENOUS
  Filled 2015-03-22: qty 1

## 2015-03-22 MED ORDER — TETANUS-DIPHTH-ACELL PERTUSSIS 5-2.5-18.5 LF-MCG/0.5 IM SUSP
0.5000 mL | Freq: Once | INTRAMUSCULAR | Status: DC
  Filled 2015-03-22: qty 0.5

## 2015-03-22 NOTE — ED Notes (Signed)
PA-C and this RN discussed with patient the risks of leaving AMA and patient states that he still wants to leave but will come back if anything changes or worsens. Patient verbalized understanding of discharge instructions and denies any further needs or questions at this time. VS stable.

## 2015-03-22 NOTE — Discharge Instructions (Signed)
1. Medications: vicodin, robaxin, usual home medications 2. Treatment: rest, drink plenty of fluids, use the brace 3. Follow Up: Please followup with your primary doctor in 2 days for discussion of your diagnoses and further evaluation after today's visit; if you do not have a primary care doctor use the resource guide provided to find one; Please return to the ER for blood in your urine, worsening pain or other concerns

## 2015-03-22 NOTE — ED Notes (Signed)
Patient requesting to leave. PA-C at bedside discussing possible options for patient.
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
## 2015-03-24 MED ORDER — IOHEXOL 300 MG/ML  SOLN
100.0000 mL | Freq: Once | INTRAMUSCULAR | Status: AC | PRN
  Administered 2015-03-21: 100 mL via INTRAVENOUS

## 2015-05-01 ENCOUNTER — Emergency Department (HOSPITAL_COMMUNITY)
Admission: EM | Admit: 2015-05-01 | Discharge: 2015-05-01 | Disposition: A | Discharge status: Home / Self Care | Payer: Medicaid - Out of State | Attending: Emergency Medicine | Admitting: Emergency Medicine

## 2015-05-01 ENCOUNTER — Encounter (HOSPITAL_COMMUNITY): Payer: Self-pay | Admitting: Emergency Medicine

## 2015-05-01 DIAGNOSIS — E119 Type 2 diabetes mellitus without complications: Secondary | ICD-10-CM | POA: Insufficient documentation

## 2015-05-01 DIAGNOSIS — I10 Essential (primary) hypertension: Secondary | ICD-10-CM | POA: Insufficient documentation

## 2015-05-01 DIAGNOSIS — G8929 Other chronic pain: Secondary | ICD-10-CM | POA: Insufficient documentation

## 2015-05-01 DIAGNOSIS — Z791 Long term (current) use of non-steroidal anti-inflammatories (NSAID): Secondary | ICD-10-CM | POA: Diagnosis not present

## 2015-05-01 DIAGNOSIS — M542 Cervicalgia: Secondary | ICD-10-CM | POA: Diagnosis not present

## 2015-05-01 DIAGNOSIS — Z79899 Other long term (current) drug therapy: Secondary | ICD-10-CM | POA: Insufficient documentation

## 2015-05-01 DIAGNOSIS — M545 Low back pain: Secondary | ICD-10-CM | POA: Diagnosis not present

## 2015-05-01 DIAGNOSIS — Z7982 Long term (current) use of aspirin: Secondary | ICD-10-CM | POA: Diagnosis not present

## 2015-05-01 DIAGNOSIS — M549 Dorsalgia, unspecified: Secondary | ICD-10-CM

## 2015-05-01 HISTORY — DX: Dorsalgia, unspecified: M54.9

## 2015-05-01 MED ORDER — DIAZEPAM 5 MG PO TABS: 5.0000 mg | ORAL_TABLET | Freq: Two times a day (BID) | ORAL | Status: DC

## 2015-05-01 MED ORDER — OXYCODONE-ACETAMINOPHEN 5-325 MG PO TABS
1.0000 | ORAL_TABLET | Freq: Once | ORAL | Status: AC
  Administered 2015-05-01: 1 via ORAL
  Filled 2015-05-01: qty 1

## 2015-05-01 NOTE — ED Provider Notes (Signed)
CSN: [id number]     Arrival date & time 05/01/15  1300 History  By signing my name below, I, Essence Howell, attest that this documentation has been prepared under the direction and in the presence of Delrae Rend, PA-C Electronically Signed: Ladene Artist, ED Scribe 05/01/2015 at 3:18 PM.   Chief Complaint  Patient presents with  . Neck Pain  . Back Pain   The history is provided by the patient. No language interpreter was used.   HPI Comments: [name] is a 60 y.o. male, with a h/o chronic back pain, who presents to the Emergency Department complaining of persistent neck pain and low back pain s/p a fall that occurred last month. Pt reports h/o chronic neck and back pain but states that pain worsened since he fell. He has tried Flexeril and Robaxin in the past without significant relief. He has also been taking percocet that we prescribed las month after a fall but he ran out. Pt does not have a local PCP; states he is visiting his girlfriend. He requests a referral to pain management at this visit.   Past Medical History  Diagnosis Date  . Diabetes mellitus without complication ([place])   . Hypertension   . Arthritis   . Back pain    Past Surgical History  Procedure Laterality Date  . Joint replacement    . Back surgery    . Cholecystectomy    . Carpal tunnel release    . Foot surgery    . Total knee arthroplasty    . Shoulder arthroscopy     Family History  Problem Relation Age of Onset  . Heart failure Mother   . Cancer Father    Social History  Substance Use Topics  . Smoking status: Never Smoker   . Smokeless tobacco: Never Used  . Alcohol Use: No    Review of Systems  Musculoskeletal: Positive for back pain and neck pain.  All other systems reviewed and are negative.  Allergies  Other; Pravastatin; Prednisone; Toradol; and Ultram  Home Medications   Prior to Admission medications   Medication Sig Start Date End Date Taking? Authorizing Provider  aspirin  EC 81 MG tablet Take 81 mg by mouth daily.    Historical Provider, MD  diclofenac (VOLTAREN) 75 MG EC tablet Take 75 mg by mouth 2 (two) times daily with a meal. 02/27/14   Historical Provider, MD  doxycycline (VIBRAMYCIN) 100 MG capsule Take 1 capsule (100 mg total) by mouth 2 (two) times daily. Patient not taking: Reported on 04/17/2014 02/26/14   Lily Kocher, PA-C  HUMULIN R 100 UNIT/ML injection Inject 5-20 Units into the skin 3 (three) times daily with meals as needed. Based on sliding scale 10/15/13   Historical Provider, MD  hydrochlorothiazide (HYDRODIURIL) 25 MG tablet Take 25 mg by mouth daily. 09/02/13   Historical Provider, MD  HYDROcodone-acetaminophen (NORCO/VICODIN) 5-325 MG tablet Take 1-2 tablets by mouth every 6 (six) hours as needed for moderate pain or severe pain. 03/22/15   Hannah Muthersbaugh, PA-C  LEVEMIR FLEXTOUCH 100 UNIT/ML Pen Inject 80 Units into the skin 2 (two) times daily. 10/08/13   Historical Provider, MD  methocarbamol (ROBAXIN) 500 MG tablet Take 1 tablet (500 mg total) by mouth 2 (two) times daily. 03/22/15   Hannah Muthersbaugh, PA-C  metoprolol (LOPRESSOR) 100 MG tablet Take 100 mg by mouth 2 (two) times daily. 09/02/13   Historical Provider, MD  naproxen (NAPROSYN) 500 MG tablet Take 1 tablet (500 mg total) by  mouth 2 (two) times daily. Patient not taking: Reported on 04/17/2014 03/13/14   Evalee Jefferson, PA-C  oxyCODONE-acetaminophen (PERCOCET) 5-325 MG per tablet Take 1-2 tablets by mouth every 6 (six) hours as needed. Patient not taking: Reported on 03/06/2014 02/20/14   Anderson Malta Piepenbrink, PA-C   BP 150/92 mmHg  Pulse 71  Temp(Src) 98 F (36.7 C) (Oral)  Resp 18  Ht 6' 3"  (1.905 m)  Wt 220 lb (99.791 kg)  BMI 27.50 kg/m2  SpO2 97% Physical Exam  Constitutional: He is oriented to person, place, and time. He appears well-developed and well-nourished. No distress.  HENT:  Head: Normocephalic and atraumatic.  Eyes: Conjunctivae and EOM are normal.  Neck: Normal  range of motion. Neck supple. No tracheal deviation present.  Cardiovascular: Normal rate.   Pulmonary/Chest: Effort normal. No respiratory distress.  Musculoskeletal: Normal range of motion.  +left cervical paraspinal and trapezial tenderness to palpation and spasm. No midline back tenderness. No stepoff or deformity.   Neurological: He is alert and oriented to person, place, and time.  Normal finger to nose Intact strength in bilateral UE and LE  Skin: Skin is warm and dry.  Psychiatric: He has a normal mood and affect. His behavior is normal.  Nursing note and vitals reviewed.  ED Course  Procedures (including critical care time) DIAGNOSTIC STUDIES: Oxygen Saturation is 97% on RA, normal by my interpretation.    COORDINATION OF CARE: 2:47 PM-Discussed treatment plan which includes Percocet and Valium with pt at bedside and pt agreed to plan.   Labs Review Labs Reviewed - No data to display  Imaging Review No results found.   EKG Interpretation None      MDM   Final diagnoses:  Chronic neck pain  Chronic back pain    Pt with chronic neck and back pain who presents to the ED for evaluation of same. Neuro exam intact. Diffuse left cervical paraspinal and trapezial tenderness/spasm. Discussed with pt i cannot give rx for narcotics for chronic pain. He states that is fine. He is in the process of moving to [place] to establish PCP and pain management. He is requesting a dose of percocet in the ED which we will give. Will also give short rx for valium to trial as robaxin and flexeril have been ineffective in the past and pt has multiple other medication intolerances including NSAIDs and steroids. Pt otherwise nontoxic appearing with no focal exam findings and is hemodynamically stable. INstructed to f/u with PCP. ER return precautions given.  I personally performed the services described in this documentation, which was scribed in my presence. The recorded information has been  reviewed and is accurate.   Anne Ng, PA-C 05/02/15 0930  Tanna Furry, MD 05/12/15 Benancio Deeds

## 2015-05-01 NOTE — ED Notes (Signed)
Declined W/C at D/C and was escorted to lobby by RN. 
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
## 2015-05-01 NOTE — Discharge Instructions (Signed)
Take medications as prescribed. Return to the emergency room for worsening condition or new concerning symptoms. Follow up with your regular doctor. If you don't have a regular doctor use one of the numbers below to establish a primary care doctor.   Emergency Department Resource Guide 1) Find a Doctor and Pay Out of Pocket Although you won't have to find out who is covered by your insurance plan, it is a good idea to ask around and get recommendations. You will then need to call the office and see if the doctor you have chosen will accept you as a new patient and what types of options they offer for patients who are self-pay. Some doctors offer discounts or will set up payment plans for their patients who do not have insurance, but you will need to ask so you aren't surprised when you get to your appointment.  2) Contact Your Local Health Department Not all health departments have doctors that can see patients for sick visits, but many do, so it is worth a call to see if yours does. If you don't know where your local health department is, you can check in your phone book. The CDC also has a tool to help you locate your state's health department, and many state websites also have listings of all of their local health departments.  3) Find a [facility] If your illness is not likely to be very severe or complicated, you may want to try a walk in clinic. These are popping up all over the country in pharmacies, drugstores, and shopping centers. They're usually staffed by nurse practitioners or physician assistants that have been trained to treat common illnesses and complaints. They're usually fairly quick and inexpensive. However, if you have serious medical issues or chronic medical problems, these are probably not your best option.  No Primary Care Doctor: - Call Health Connect at  [phone] - they can help you locate a primary care doctor that  accepts your insurance, provides certain services,  etc. - Physician Referral [phone]  Emergency Department Resource Guide 1) Find a Doctor and Pay Out of Pocket Although you won't have to find out who is covered by your insurance plan, it is a good idea to ask around and get recommendations. You will then need to call the office and see if the doctor you have chosen will accept you as a new patient and what types of options they offer for patients who are self-pay. Some doctors offer discounts or will set up payment plans for their patients who do not have insurance, but you will need to ask so you aren't surprised when you get to your appointment.  2) Contact Your Local Health Department Not all health departments have doctors that can see patients for sick visits, but many do, so it is worth a call to see if yours does. If you don't know where your local health department is, you can check in your phone book. The CDC also has a tool to help you locate your state's health department, and many state websites also have listings of all of their local health departments.  3) Find a [facility] If your illness is not likely to be very severe or complicated, you may want to try a walk in clinic. These are popping up all over the country in pharmacies, drugstores, and shopping centers. They're usually staffed by nurse practitioners or physician assistants that have been trained to treat common illnesses and complaints. They're usually fairly  quick and inexpensive. However, if you have serious medical issues or chronic medical problems, these are probably not your best option.  No Primary Care Doctor: - Call Health Connect at  [phone] - they can help you locate a primary care doctor that  accepts your insurance, provides certain services, etc. - Physician Referral Service- [phone]  Chronic Pain Problems: Organization         Address  Phone   Notes  [facility]  [phone] Patients need to be referred by  their primary care doctor.   Medication Assistance: Organization         Address  Phone   Notes  [facility] Medication [facility] [address] [place], [place] [address] [phone] --Must be a resident of [facility] -- Must have NO insurance coverage whatsoever (no Medicaid/ Medicare, etc.) -- The pt. MUST have a primary care doctor that directs their care regularly and follows them in the community   [facility]  [phone]   [employer]  [phone]    Agencies that provide inexpensive medical care: Organization         Address  Phone   Notes  [place]  [phone]   [facility]    [phone]   [facility] [place], [place] [address] [phone]   [place] [address] [place] [phone]   [facility]    [phone]   [facility]    [phone]   [place] and [place] [address], [place] Phone:  [phone], Fax:  [phone] Hours of Operation:  9 am - 6 pm, M-F.  Also accepts Medicaid/Medicare and self-pay.  [facility] for [place] [address], [address], [place] Phone: [phone], Fax: [phone]. Hours of Operation:  8:30 am - 5:30 pm, M-F.  Also accepts Medicaid and self-pay.  [facility] [place] [address], [place] Phone: [phone]   [place], [place], [place] [phone], Ext. [phone] Mondays & Thursdays: 7-9 AM.  First 15 patients are seen on a first come, first serve basis.    [place] Providers:  Organization         Address  Phone   Notes  [facility] [address], [place] [phone] Also accepts self-pay patients.  [facility] [address], [place]  [phone]   [place], [address], [place] [phone]   [facility] [address] [place] [phone]   Lucianne Lei [address], [place]   [phone] Only accepts [place] [facility] [place] patients after they have their name applied to their card.   Self-Pay (no insurance) in [facility]:  Organization         Address  Phone   Notes  Sickle Cell Patients, [facility] [place] [phone]   [facility] [place] [phone]   [facility] [place]  [address] [place], [address], [place] [phone]   [facility]/Dr. Osei-Bonsu  [address], [place] or [place], [address], [place] [phone] Phone number  for both [employer] and [place] locations is the same.  [facility] and [facility] [address], [place] [phone]   [facility] [address], [place] or [address] [phone]   [facility] [address], [place] [phone], phone; [phone], fax Sees patients 1st and 3rd Saturday of every month.  Must not qualify for public or private insurance (i.e. Medicaid, Medicare,  Health Choice, Veterans' Benefits)  Household income should be no more than 200% of the poverty level The clinic cannot treat you if you are pregnant or think you are pregnant  Sexually transmitted diseases are not treated at the clinic.

## 2016-03-16 ENCOUNTER — Encounter (HOSPITAL_COMMUNITY): Payer: Self-pay

## 2016-03-16 ENCOUNTER — Emergency Department (HOSPITAL_COMMUNITY): Payer: Medicaid - Out of State

## 2016-03-16 ENCOUNTER — Emergency Department (HOSPITAL_COMMUNITY)
Admission: EM | Admit: 2016-03-16 | Discharge: 2016-03-16 | Disposition: A | Discharge status: Home / Self Care | Payer: Medicaid - Out of State | Attending: Emergency Medicine | Admitting: Emergency Medicine

## 2016-03-16 DIAGNOSIS — Z7984 Long term (current) use of oral hypoglycemic drugs: Secondary | ICD-10-CM | POA: Insufficient documentation

## 2016-03-16 DIAGNOSIS — N201 Calculus of ureter: Secondary | ICD-10-CM | POA: Diagnosis not present

## 2016-03-16 DIAGNOSIS — Z79899 Other long term (current) drug therapy: Secondary | ICD-10-CM | POA: Insufficient documentation

## 2016-03-16 DIAGNOSIS — E119 Type 2 diabetes mellitus without complications: Secondary | ICD-10-CM | POA: Insufficient documentation

## 2016-03-16 DIAGNOSIS — R319 Hematuria, unspecified: Secondary | ICD-10-CM

## 2016-03-16 DIAGNOSIS — I10 Essential (primary) hypertension: Secondary | ICD-10-CM | POA: Diagnosis not present

## 2016-03-16 DIAGNOSIS — N23 Unspecified renal colic: Secondary | ICD-10-CM

## 2016-03-16 DIAGNOSIS — Z7982 Long term (current) use of aspirin: Secondary | ICD-10-CM | POA: Insufficient documentation

## 2016-03-16 LAB — COMPREHENSIVE METABOLIC PANEL
ALT: 21 U/L (ref 17–63)
AST: 25 U/L (ref 15–41)
Albumin: 3.9 g/dL (ref 3.5–5.0)
Alkaline Phosphatase: 54 U/L (ref 38–126)
Anion gap: 9 (ref 5–15)
BUN: 28 mg/dL — AB (ref 6–20)
CHLORIDE: 108 mmol/L (ref 101–111)
CO2: 24 mmol/L (ref 22–32)
CREATININE: 1.08 mg/dL (ref 0.61–1.24)
Calcium: 9.8 mg/dL (ref 8.9–10.3)
GFR calc non Af Amer: >60 mL/min (ref >60)
Glucose, Bld: 192 mg/dL — ABNORMAL HIGH (ref 65–99)
Potassium: 4.5 mmol/L (ref 3.5–5.1)
SODIUM: 141 mmol/L (ref 135–145)
Total Bilirubin: 0.8 mg/dL (ref 0.3–1.2)
Total Protein: 7.2 g/dL (ref 6.5–8.1)

## 2016-03-16 LAB — URINALYSIS, ROUTINE W REFLEX MICROSCOPIC
BACTERIA UA: NONE SEEN
BILIRUBIN URINE: NEGATIVE
Glucose, UA: >=500 mg/dL — AB
Ketones, ur: NEGATIVE mg/dL
LEUKOCYTES UA: NEGATIVE
NITRITE: NEGATIVE
PROTEIN: NEGATIVE mg/dL
Specific Gravity, Urine: 1.018 (ref 1.005–1.030)
pH: 5 (ref 5.0–8.0)

## 2016-03-16 LAB — CBC WITH DIFFERENTIAL/PLATELET
BASOS ABS: 0 10*3/uL (ref 0.0–0.1)
Basophils Relative: 0 %
EOS ABS: 0.1 10*3/uL (ref 0.0–0.7)
EOS PCT: 1 %
HCT: 37.5 % — ABNORMAL LOW (ref 39.0–52.0)
Hemoglobin: 13.8 g/dL (ref 13.0–17.0)
Lymphocytes Relative: 39 %
Lymphs Abs: 2.9 10*3/uL (ref 0.7–4.0)
MCH: 34.5 pg — ABNORMAL HIGH (ref 26.0–34.0)
MCHC: 36.8 g/dL — ABNORMAL HIGH (ref 30.0–36.0)
MCV: 93.8 fL (ref 78.0–100.0)
Monocytes Absolute: 0.5 10*3/uL (ref 0.1–1.0)
Monocytes Relative: 7 %
Neutro Abs: 3.9 10*3/uL (ref 1.7–7.7)
Neutrophils Relative %: 53 %
PLATELETS: 150 10*3/uL (ref 150–400)
RBC: 4 MIL/uL — AB (ref 4.22–5.81)
RDW: 13.2 % (ref 11.5–15.5)
WBC: 7.4 10*3/uL (ref 4.0–10.5)

## 2016-03-16 MED ORDER — HYDROCODONE-ACETAMINOPHEN 5-325 MG PO TABS
1.0000 | ORAL_TABLET | Freq: Once | ORAL | Status: DC
  Filled 2016-03-16: qty 1

## 2016-03-16 MED ORDER — TAMSULOSIN HCL 0.4 MG PO CAPS: 0.4000 mg | ORAL_CAPSULE | Freq: Every day | ORAL | 0 refills | Status: DC

## 2016-03-16 MED ORDER — SODIUM CHLORIDE 0.9 % IV BOLUS (SEPSIS)
1000.0000 mL | Freq: Once | INTRAVENOUS | Status: AC
  Administered 2016-03-16: 1000 mL via INTRAVENOUS

## 2016-03-16 MED ORDER — HYDROCODONE-ACETAMINOPHEN 5-325 MG PO TABS: 1.0000 | ORAL_TABLET | ORAL | 0 refills | Status: DC | PRN

## 2016-03-16 NOTE — ED Provider Notes (Signed)
[facility] DEPT Provider Note   CSN: [id number] Arrival date & time: 03/16/16  1911     History   Chief Complaint Chief Complaint  Patient presents with  . Flank Pain    HPI [name] is a 61 y.o. male with a past medical history of DM, HTN and chronic back pain presenting with a a known right kidney stone, diagnosed about 2 weeks ago in [place] by an Urgent Care. He reports having a CT scan confirming the diagnosis.  He has noticed increased right flank and lower abdominal pain along with hematuria for the past 2 days, worsened with urination and radiating into his penis.  He denies fevers, chills, vomiting but has been nauseated. He denies penile discharge.  He also reports a history of chronic back pain but reports his current pain is different in location and quality.  He denies being prescribed any medications for his symptoms and was referred to urologist in [place], but was told it would be 6-8 weeks before he could get an appointment there.  He does not currently have a primary doctor.  The history is provided by the patient.    Past Medical History:  Diagnosis Date  . Arthritis   . Back pain   . Diabetes mellitus without complication ([place])   . Hypertension     There are no active problems to display for this patient.   Past Surgical History:  Procedure Laterality Date  . BACK SURGERY    . CARPAL TUNNEL RELEASE    . CHOLECYSTECTOMY    . FOOT SURGERY    . JOINT REPLACEMENT    . SHOULDER ARTHROSCOPY    . TOTAL KNEE ARTHROPLASTY         Home Medications    Prior to Admission medications   Medication Sig Start Date End Date Taking? Authorizing Provider  amLODipine (NORVASC) 10 MG tablet Take 10 mg by mouth daily. 02/08/16  Yes Historical Provider, MD  aspirin EC 81 MG tablet Take 81 mg by mouth daily.   Yes Historical Provider, MD  hydrochlorothiazide (HYDRODIURIL) 25 MG tablet Take 25 mg by mouth daily. 09/02/13  Yes Historical Provider, MD    LEVEMIR FLEXTOUCH 100 UNIT/ML Pen Inject 80 Units into the skin 2 (two) times daily. 10/08/13  Yes Historical Provider, MD  lisinopril (PRINIVIL,ZESTRIL) 40 MG tablet Take 40 mg by mouth daily. 03/05/16  Yes Historical Provider, MD  metFORMIN (GLUCOPHAGE) 500 MG tablet Take 500 mg by mouth 2 (two) times daily with a meal. 02/13/16  Yes Historical Provider, MD  metoprolol (LOPRESSOR) 100 MG tablet Take 100 mg by mouth 2 (two) times daily. 09/02/13  Yes Historical Provider, MD  HYDROcodone-acetaminophen (NORCO/VICODIN) 5-325 MG tablet Take 1 tablet by mouth every 4 (four) hours as needed. 03/16/16   Evalee Jefferson, PA-C  tamsulosin (FLOMAX) 0.4 MG CAPS capsule Take 1 capsule (0.4 mg total) by mouth daily after supper. 03/16/16   Evalee Jefferson, PA-C    Family History Family History  Problem Relation Age of Onset  . Heart failure Mother   . Cancer Father     Social History Social History  Substance Use Topics  . Smoking status: Never Smoker  . Smokeless tobacco: Never Used  . Alcohol use No     Allergies   Other; Pravastatin; Prednisone; Toradol [ketorolac tromethamine]; and Ultram [tramadol]   Review of Systems Review of Systems  Constitutional: Negative for chills and fever.  HENT: Negative.   Eyes: Negative.   Respiratory: Negative for  chest tightness and shortness of breath.   Cardiovascular: Negative for chest pain.  Gastrointestinal: Positive for nausea. Negative for abdominal pain and vomiting.  Genitourinary: Positive for dysuria, flank pain, hematuria and penile pain. Negative for discharge and testicular pain.  Musculoskeletal: Positive for back pain.  Skin: Negative.  Negative for rash and wound.  Neurological: Negative.   Psychiatric/Behavioral: Negative.      Physical Exam Updated Vital Signs BP 110/70 (BP Location: Left Arm)   Pulse 75   Temp 97.9 F (36.6 C) (Oral)   Resp 20   Ht 6' 3"  (1.905 m)   Wt 108.9 kg   SpO2 97%   BMI 30.00 kg/m   Physical Exam   Constitutional: He appears well-developed and well-nourished.  HENT:  Head: Normocephalic and atraumatic.  Eyes: Conjunctivae are normal.  Neck: Normal range of motion.  Cardiovascular: Normal rate, regular rhythm, normal heart sounds and intact distal pulses.   Pulmonary/Chest: Effort normal and breath sounds normal. He has no wheezes.  Abdominal: Soft. Bowel sounds are normal. There is no tenderness. There is CVA tenderness. There is no guarding.  cva ttp right.  Musculoskeletal: Normal range of motion.  Neurological: He is alert.  Skin: Skin is warm and dry.  Psychiatric: He has a normal mood and affect.  Nursing note and vitals reviewed.    ED Treatments / Results  Labs (all labs ordered are listed, but only abnormal results are displayed) Labs Reviewed  URINALYSIS, ROUTINE W REFLEX MICROSCOPIC - Abnormal; Notable for the following:       Result Value   Glucose, UA >=500 (*)    Hgb urine dipstick LARGE (*)    All other components within normal limits  CBC WITH DIFFERENTIAL/PLATELET - Abnormal; Notable for the following:    RBC 4.00 (*)    HCT 37.5 (*)    MCH 34.5 (*)    MCHC 36.8 (*)    All other components within normal limits  COMPREHENSIVE METABOLIC PANEL - Abnormal; Notable for the following:    Glucose, Bld 192 (*)    BUN 28 (*)    All other components within normal limits  URINE CULTURE    EKG  EKG Interpretation None       Radiology Dg Abdomen 1 View  Result Date: 03/16/2016 CLINICAL DATA:  Right flank pain for few weeks.  Blood in urine. EXAM: ABDOMEN - 1 VIEW COMPARISON:  None. FINDINGS: No renal or ureteral stones are seen on this study. Fecal loading throughout the colon. IMPRESSION: Both kidneys are partially obscured by bowel contents but no renal or ureteral stones are definitively identified. If there is continued concern, CT imaging would be more sensitive and specific. Electronically Signed   By: Dorise Bullion III M.D   On: 03/16/2016 20:38     Procedures Procedures (including critical care time)  Medications Ordered in ED Medications  HYDROcodone-acetaminophen (NORCO/VICODIN) 5-325 MG per tablet 1 tablet (1 tablet Oral Not Given 03/16/16 2333)  sodium chloride 0.9 % bolus 1,000 mL (0 mLs Intravenous Stopped 03/16/16 2248)     Initial Impression / Assessment and Plan / ED Course  I have reviewed the triage vital signs and the nursing notes.  Pertinent labs & imaging results that were available during my care of the patient were reviewed by me and considered in my medical decision making (see chart for details).     Pt has a prior history with this dept of drug seeking behavior, currently resided in [place],unable to check  [place], no narcotic scripts in [place] x 6 months. His imaging is negative for evidence of kidney stone, at least he does not have a stone large enough to be seen on kub. He does have hematuria.     He was given a referral  to local urology for f/u if his flank pain persists.  Urine strainer given.  Pt started on flomax, given hematuria, will tx pain with hydrocodone.   Discussed return precautions including fever, vomiting, worsened pain.  Final Clinical Impressions(s) / ED Diagnoses   Final diagnoses:  Ureteral colic  Hematuria, unspecified type    New Prescriptions Discharge Medication List as of 03/16/2016 11:22 PM    START taking these medications   Details  HYDROcodone-acetaminophen (NORCO/VICODIN) 5-325 MG tablet Take 1 tablet by mouth every 4 (four) hours as needed., Starting Sat 03/16/2016, Print    tamsulosin (FLOMAX) 0.4 MG CAPS capsule Take 1 capsule (0.4 mg total) by mouth daily after supper., Starting Sat 03/16/2016, Print         Evalee Jefferson, PA-C 03/17/16 0032    Margette Fast, MD 03/17/16 [phone]

## 2016-03-16 NOTE — ED Notes (Signed)
Pt was attempting to get urine, unsuccessful, will try again later.

## 2016-03-16 NOTE — ED Triage Notes (Signed)
Patient states that he was told he had kidney stones a few weeks ago.  Started having blood in his urine two days ago.  Having pain in right flank and in abdomen.  I have pain in my penis in urinating.

## 2016-03-16 NOTE — Discharge Instructions (Signed)
You may take the hydrocodone prescribed for pain relief.  This will make you drowsy - do not drive within 4 hours of taking this medication.  Take your next dose of flomax tomorrow evening.

## 2016-03-19 LAB — URINE CULTURE: Culture: <10000 — AB

## 2016-06-29 IMAGING — DX DG SHOULDER 2+V*R*
3 series · 3 of 3 positions shown · non-contrast
Comparison: Three views right shoulder 12/05/2013.

CLINICAL DATA: Right shoulder pain after lifting heavy boxes 2 days
ago. Initial encounter.

EXAM:
RIGHT SHOULDER - 2+ VIEW

[shoulder ap]
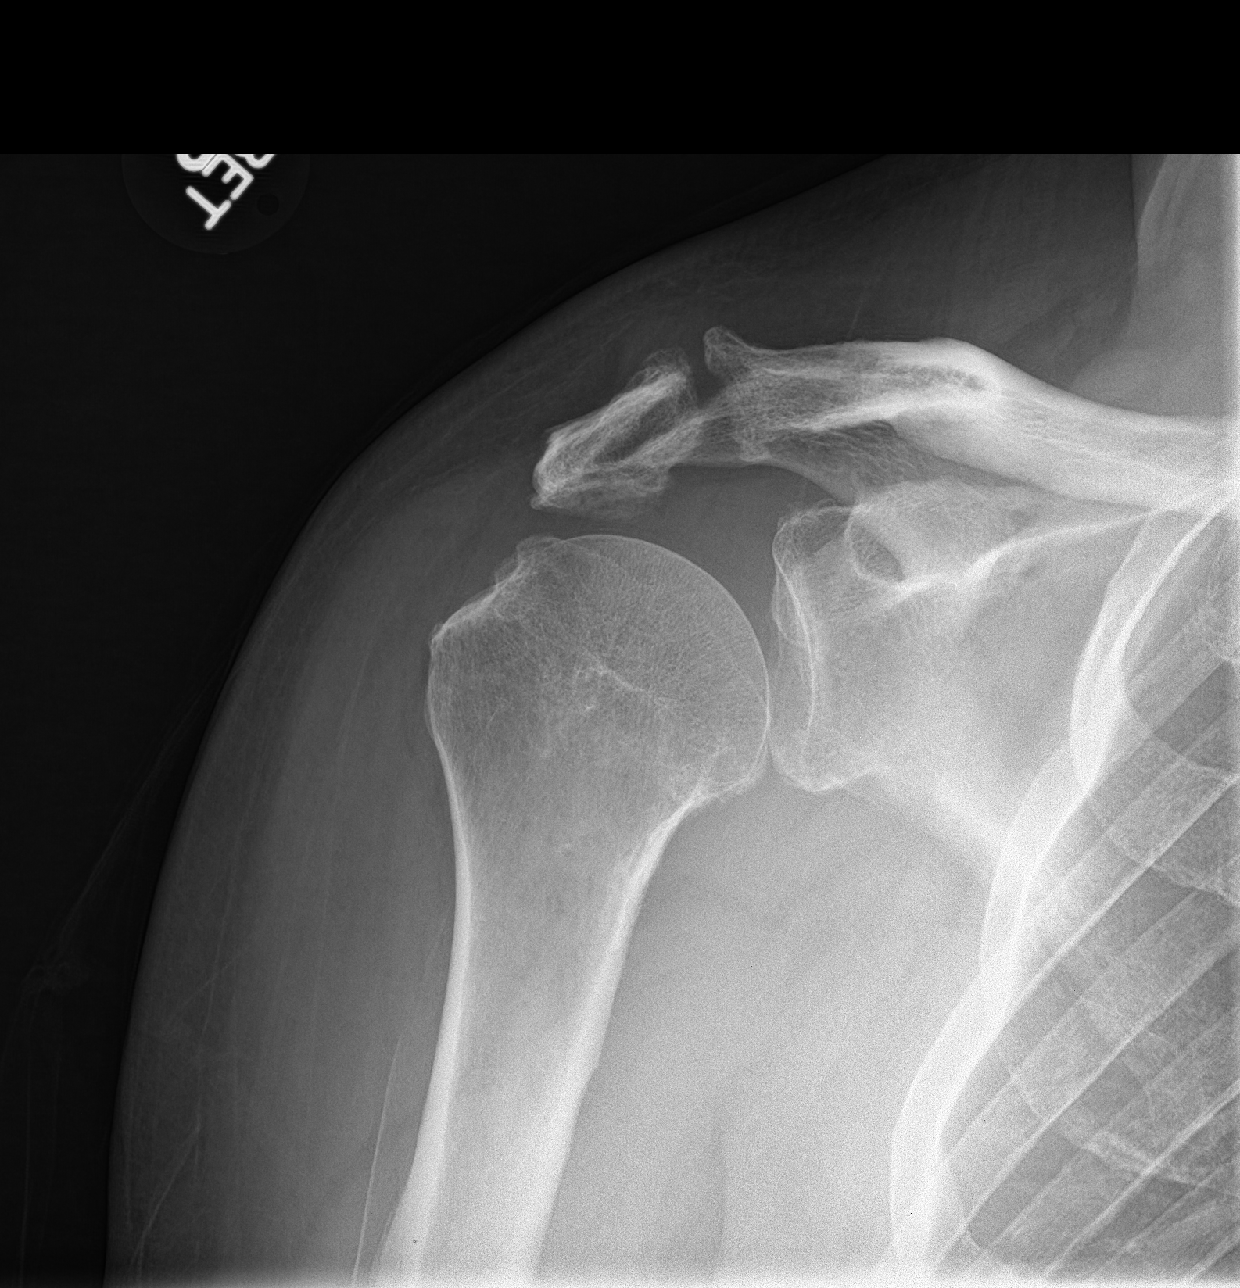

[shoulder y view]
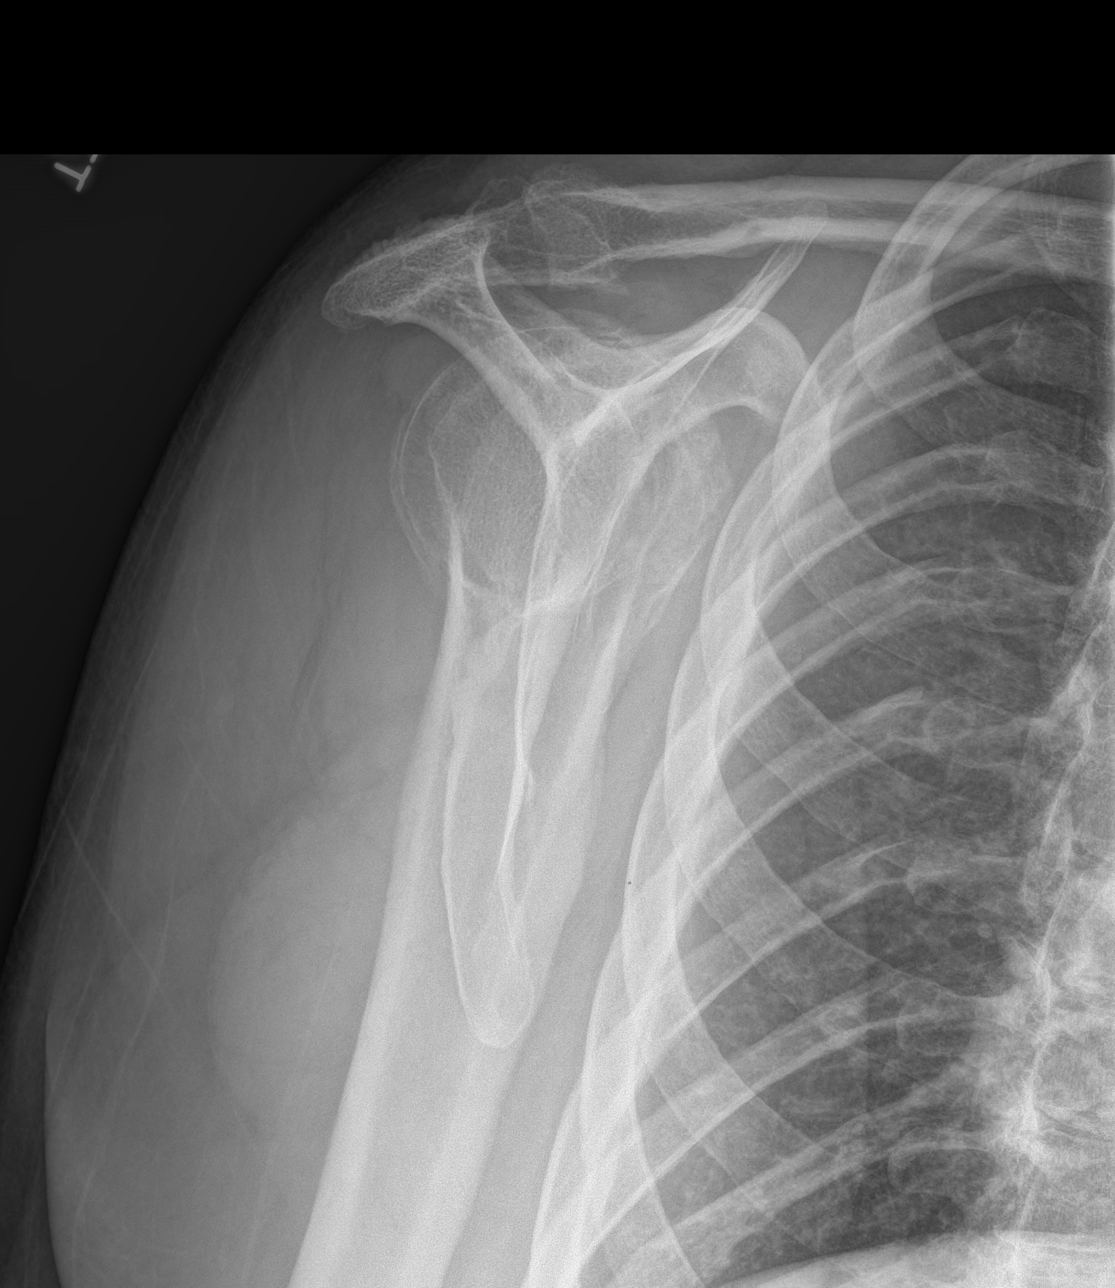

[shoulder axillary]
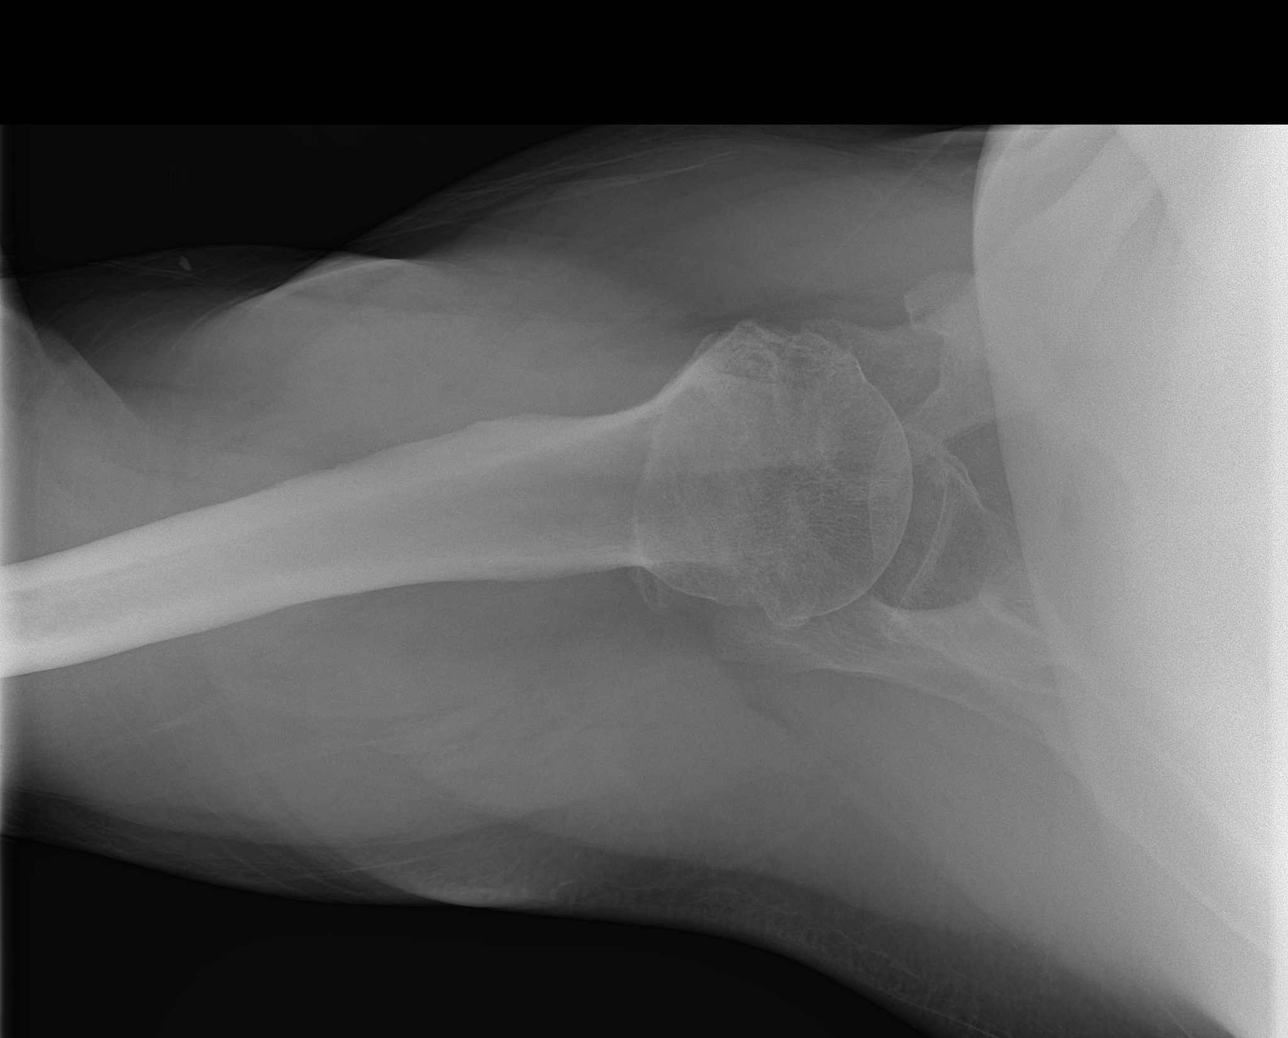

[3 of 3 positions shown; findings below may reference images not displayed]

FINDINGS: There is no acute bony or joint abnormality. Acromioclavicular
osteoarthritis is again seen. Imaged right lung and ribs appear
normal.
IMPRESSION: No acute finding.

Acromioclavicular osteoarthritis.

## 2017-01-22 ENCOUNTER — Encounter (HOSPITAL_COMMUNITY): Payer: Self-pay | Admitting: Emergency Medicine

## 2017-01-22 ENCOUNTER — Emergency Department (HOSPITAL_COMMUNITY)
Admission: EM | Admit: 2017-01-22 | Discharge: 2017-01-22 | Disposition: A | Discharge status: Home / Self Care | Payer: Medicaid Other | Attending: Emergency Medicine | Admitting: Emergency Medicine

## 2017-01-22 ENCOUNTER — Other Ambulatory Visit: Payer: Self-pay

## 2017-01-22 DIAGNOSIS — I10 Essential (primary) hypertension: Secondary | ICD-10-CM | POA: Insufficient documentation

## 2017-01-22 DIAGNOSIS — Z79899 Other long term (current) drug therapy: Secondary | ICD-10-CM | POA: Diagnosis not present

## 2017-01-22 DIAGNOSIS — Z7984 Long term (current) use of oral hypoglycemic drugs: Secondary | ICD-10-CM | POA: Diagnosis not present

## 2017-01-22 DIAGNOSIS — E119 Type 2 diabetes mellitus without complications: Secondary | ICD-10-CM | POA: Diagnosis not present

## 2017-01-22 DIAGNOSIS — Z7982 Long term (current) use of aspirin: Secondary | ICD-10-CM | POA: Insufficient documentation

## 2017-01-22 DIAGNOSIS — L03031 Cellulitis of right toe: Secondary | ICD-10-CM | POA: Insufficient documentation

## 2017-01-22 LAB — BASIC METABOLIC PANEL
Anion gap: 9 (ref 5–15)
BUN: 19 mg/dL (ref 6–20)
CALCIUM: 9.7 mg/dL (ref 8.9–10.3)
CO2: 26 mmol/L (ref 22–32)
CREATININE: 1.07 mg/dL (ref 0.61–1.24)
Chloride: 105 mmol/L (ref 101–111)
GFR calc non Af Amer: >60 mL/min (ref >60)
Glucose, Bld: 348 mg/dL — ABNORMAL HIGH (ref 65–99)
Potassium: 4.8 mmol/L (ref 3.5–5.1)
SODIUM: 140 mmol/L (ref 135–145)

## 2017-01-22 LAB — CBC WITH DIFFERENTIAL/PLATELET
BASOS ABS: 0 10*3/uL (ref 0.0–0.1)
BASOS PCT: 0 %
EOS ABS: 0.1 10*3/uL (ref 0.0–0.7)
Eosinophils Relative: 2 %
HEMATOCRIT: 38.4 % — AB (ref 39.0–52.0)
HEMOGLOBIN: 13.3 g/dL (ref 13.0–17.0)
Lymphocytes Relative: 34 %
Lymphs Abs: 1.9 10*3/uL (ref 0.7–4.0)
MCH: 31.9 pg (ref 26.0–34.0)
MCHC: 34.6 g/dL (ref 30.0–36.0)
MCV: 92.1 fL (ref 78.0–100.0)
MONO ABS: 0.3 10*3/uL (ref 0.1–1.0)
MONOS PCT: 5 %
NEUTROS ABS: 3.2 10*3/uL (ref 1.7–7.7)
NEUTROS PCT: 59 %
Platelets: 134 10*3/uL — ABNORMAL LOW (ref 150–400)
RBC: 4.17 MIL/uL — ABNORMAL LOW (ref 4.22–5.81)
RDW: 13 % (ref 11.5–15.5)
WBC: 5.5 10*3/uL (ref 4.0–10.5)

## 2017-01-22 LAB — CBG MONITORING, ED: GLUCOSE-CAPILLARY: 329 mg/dL — AB (ref 65–99)

## 2017-01-22 MED ORDER — LIDOCAINE HCL (PF) 2 % IJ SOLN
INTRAMUSCULAR | Status: AC
  Administered 2017-01-22: 19:00:00
  Filled 2017-01-22: qty 20

## 2017-01-22 MED ORDER — CEPHALEXIN 500 MG PO CAPS: 500.0000 mg | ORAL_CAPSULE | Freq: Four times a day (QID) | ORAL | 0 refills | Status: DC

## 2017-01-22 NOTE — ED Provider Notes (Signed)
[facility] EMERGENCY DEPARTMENT Provider Note   CSN: [id number] Arrival date & time: 01/22/17  1630     History   Chief Complaint Chief Complaint  Patient presents with  . Wound Infection    HPI Mandell Pangborn is a 62 y.o. male.  Patient complains of bilateral toe pain.  He states he woke up this morning and his toenail on his right foot was almost completely torn off.  The one left foot has been like that for a number of weeks.  He has diabetes and does not follow-up with anybody   The history is provided by the patient. No language interpreter was used.  Illness  This is a new problem. The current episode started more than 2 days ago. The problem occurs constantly. The problem has not changed since onset.Pertinent negatives include no chest pain, no abdominal pain and no headaches. Nothing aggravates the symptoms. Nothing relieves the symptoms. He has tried nothing for the symptoms. The treatment provided no relief.    Past Medical History:  Diagnosis Date  . Arthritis   . Back pain   . Diabetes mellitus without complication ([place])   . Hypertension     There are no active problems to display for this patient.   Past Surgical History:  Procedure Laterality Date  . BACK SURGERY    . CARPAL TUNNEL RELEASE    . CHOLECYSTECTOMY    . FOOT SURGERY    . JOINT REPLACEMENT    . SHOULDER ARTHROSCOPY    . TOTAL KNEE ARTHROPLASTY         Home Medications    Prior to Admission medications   Medication Sig Start Date End Date Taking? Authorizing Provider  amLODipine (NORVASC) 10 MG tablet Take 10 mg by mouth daily. 02/08/16   [provider]  aspirin EC 81 MG tablet Take 81 mg by mouth daily.    [provider]  cephALEXin (KEFLEX) 500 MG capsule Take 1 capsule (500 mg total) by mouth 4 (four) times daily. 01/22/17   Milton Ferguson, MD  hydrochlorothiazide (HYDRODIURIL) 25 MG tablet Take 25 mg by mouth daily. 09/02/13   [provider]    HYDROcodone-acetaminophen (NORCO/VICODIN) 5-325 MG tablet Take 1 tablet by mouth every 4 (four) hours as needed. 03/16/16   Idol, Almyra Free, PA-C  LEVEMIR FLEXTOUCH 100 UNIT/ML Pen Inject 80 Units into the skin 2 (two) times daily. 10/08/13   [provider]  lisinopril (PRINIVIL,ZESTRIL) 40 MG tablet Take 40 mg by mouth daily. 03/05/16   [provider]  metFORMIN (GLUCOPHAGE) 500 MG tablet Take 500 mg by mouth 2 (two) times daily with a meal. 02/13/16   [provider]  metoprolol (LOPRESSOR) 100 MG tablet Take 100 mg by mouth 2 (two) times daily. 09/02/13   [provider]  tamsulosin (FLOMAX) 0.4 MG CAPS capsule Take 1 capsule (0.4 mg total) by mouth daily after supper. 03/16/16   Evalee Jefferson, PA-C    Family History Family History  Problem Relation Age of Onset  . Heart failure Mother   . Cancer Father     Social History Social History   Tobacco Use  . Smoking status: Never Smoker  . Smokeless tobacco: Never Used  Substance Use Topics  . Alcohol use: No  . Drug use: No     Allergies   Other; Pravastatin; Prednisone; Toradol [ketorolac tromethamine]; and Ultram [tramadol]   Review of Systems Review of Systems  Constitutional: Negative for appetite change and fatigue.  HENT: Negative  for congestion, ear discharge and sinus pressure.   Eyes: Negative for discharge.  Respiratory: Negative for cough.   Cardiovascular: Negative for chest pain.  Gastrointestinal: Negative for abdominal pain and diarrhea.  Genitourinary: Negative for frequency and hematuria.  Musculoskeletal: Negative for back pain.       Toe pain  Skin: Negative for rash.  Neurological: Negative for seizures and headaches.  Psychiatric/Behavioral: Negative for hallucinations.     Physical Exam Updated Vital Signs BP 121/79 (BP Location: Right Arm)   Pulse 64   Temp 97.9 F (36.6 C) (Oral)   Resp (!) 22   SpO2 100%   Physical Exam  Constitutional: He is oriented to  person, place, and time. He appears well-developed.  HENT:  Head: Normocephalic.  Eyes: Conjunctivae and EOM are normal. No scleral icterus.  Neck: Neck supple. No thyromegaly present.  Cardiovascular: Normal rate and regular rhythm. Exam reveals no gallop and no friction rub.  No murmur heard. Pulmonary/Chest: No stridor. He has no wheezes. He has no rales. He exhibits no tenderness.  Abdominal: He exhibits no distension. There is no tenderness. There is no rebound.  Musculoskeletal: Normal range of motion. He exhibits no edema.  Bilateral large toenails almost completely avulsed.  Lymphadenopathy:    He has no cervical adenopathy.  Neurological: He is oriented to person, place, and time. He exhibits normal muscle tone. Coordination normal.  Skin: No rash noted. No erythema.  Psychiatric: He has a normal mood and affect. His behavior is normal.     ED Treatments / Results  Labs (all labs ordered are listed, but only abnormal results are displayed) Labs Reviewed  CBC WITH DIFFERENTIAL/PLATELET - Abnormal; Notable for the following components:      Result Value   RBC 4.17 (*)    HCT 38.4 (*)    Platelets 134 (*)    All other components within normal limits  BASIC METABOLIC PANEL - Abnormal; Notable for the following components:   Glucose, Bld 348 (*)    All other components within normal limits  CBG MONITORING, ED - Abnormal; Notable for the following components:   Glucose-Capillary 329 (*)    All other components within normal limits    EKG  EKG Interpretation None       Radiology No results found.  Procedures .Nerve Block Date/Time: 01/22/2017 8:46 PM Performed by: Milton Ferguson, MD Authorized by: Milton Ferguson, MD   Consent:    Consent obtained:  Verbal   Consent given by:  Patient   Risks discussed:  Bleeding   Alternatives discussed:  No treatment Indications:    Indications:  Procedural anesthesia Location:    Nerve block body site: Right great  toe. Pre-procedure details:    Skin preparation:  Alcohol Procedure details (see MAR for exact dosages):    Block needle gauge:  25 G   Anesthetic injected:  Lidocaine 1% w/o epi Comments:     Patient had a digital block done to his right great toe using 1% lidocaine without epi.  Patient tolerated the procedure well and his toe was anesthetized fine    (including critical care time)  Medications Ordered in ED Medications  lidocaine (XYLOCAINE) 2 % injection (  Given 01/22/17 1911)     Initial Impression / Assessment and Plan / ED Course  I have reviewed the triage vital signs and the nursing notes.  Pertinent labs & imaging results that were available during my care of the patient were reviewed by me and considered  in my medical decision making (see chart for details).    Patient's left great toe was removed without difficulty because it was only hanging on by a couple of small pieces of skin.  I described it with my hand and pulled off.  His right great toenail was also barely hanging on but the patient wanted a digital block.  The digital block was done and then the nail was removed without problems.  Patient will be placed on antibiotics and will follow up with podiatrist Final Clinical Impressions(s) / ED Diagnoses   Final diagnoses:  Cellulitis of toe of right foot    ED Discharge Orders        Ordered    cephALEXin (KEFLEX) 500 MG capsule  4 times daily     01/22/17 2040       Milton Ferguson, MD 01/22/17 2048

## 2017-01-22 NOTE — ED Triage Notes (Signed)
Showed a pic from his phone and wounds are open.hx of and has been to wound center.

## 2017-01-22 NOTE — Discharge Instructions (Signed)
Clean your toes twice a day gently with soap and water.  Place a new dressing on it.  Follow-up with Dr. Caprice Beaver next week for your toes and follow-up with a family doctor for your diabetes.

## 2017-01-22 NOTE — ED Notes (Signed)
Bilaterally wrapped large toes per EDP orders.

## 2017-01-22 NOTE — ED Triage Notes (Signed)
Pt c/o wound to left great and 2nd toe. Also to right great toe. Started yesterday

## 2017-02-16 ENCOUNTER — Telehealth: Payer: Self-pay

## 2017-03-28 ENCOUNTER — Emergency Department (HOSPITAL_COMMUNITY)
Admission: EM | Admit: 2017-03-28 | Discharge: 2017-03-29 | Disposition: A | Discharge status: Home / Self Care | Payer: Medicaid Other | Attending: Emergency Medicine | Admitting: Emergency Medicine

## 2017-03-28 ENCOUNTER — Emergency Department (HOSPITAL_COMMUNITY): Payer: Medicaid Other

## 2017-03-28 ENCOUNTER — Encounter (HOSPITAL_COMMUNITY): Payer: Self-pay | Admitting: Emergency Medicine

## 2017-03-28 ENCOUNTER — Other Ambulatory Visit: Payer: Self-pay

## 2017-03-28 DIAGNOSIS — M79674 Pain in right toe(s): Secondary | ICD-10-CM | POA: Diagnosis not present

## 2017-03-28 DIAGNOSIS — Z79899 Other long term (current) drug therapy: Secondary | ICD-10-CM | POA: Diagnosis not present

## 2017-03-28 DIAGNOSIS — E119 Type 2 diabetes mellitus without complications: Secondary | ICD-10-CM | POA: Insufficient documentation

## 2017-03-28 DIAGNOSIS — Z7982 Long term (current) use of aspirin: Secondary | ICD-10-CM | POA: Insufficient documentation

## 2017-03-28 DIAGNOSIS — Z794 Long term (current) use of insulin: Secondary | ICD-10-CM | POA: Insufficient documentation

## 2017-03-28 DIAGNOSIS — M79675 Pain in left toe(s): Secondary | ICD-10-CM | POA: Diagnosis present

## 2017-03-28 DIAGNOSIS — I10 Essential (primary) hypertension: Secondary | ICD-10-CM | POA: Insufficient documentation

## 2017-03-28 DIAGNOSIS — Z96659 Presence of unspecified artificial knee joint: Secondary | ICD-10-CM | POA: Insufficient documentation

## 2017-03-28 DIAGNOSIS — R739 Hyperglycemia, unspecified: Secondary | ICD-10-CM | POA: Insufficient documentation

## 2017-03-28 DIAGNOSIS — L089 Local infection of the skin and subcutaneous tissue, unspecified: Secondary | ICD-10-CM | POA: Insufficient documentation

## 2017-03-28 LAB — CBC WITH DIFFERENTIAL/PLATELET
BASOS PCT: 0 %
Basophils Absolute: 0 10*3/uL (ref 0.0–0.1)
Eosinophils Absolute: 0.1 10*3/uL (ref 0.0–0.7)
Eosinophils Relative: 2 %
HEMATOCRIT: 34.8 % — AB (ref 39.0–52.0)
HEMOGLOBIN: 12.1 g/dL — AB (ref 13.0–17.0)
LYMPHS ABS: 2.1 10*3/uL (ref 0.7–4.0)
Lymphocytes Relative: 35 %
MCH: 31.8 pg (ref 26.0–34.0)
MCHC: 34.8 g/dL (ref 30.0–36.0)
MCV: 91.3 fL (ref 78.0–100.0)
MONO ABS: 0.3 10*3/uL (ref 0.1–1.0)
MONOS PCT: 5 %
NEUTROS ABS: 3.4 10*3/uL (ref 1.7–7.7)
NEUTROS PCT: 58 %
Platelets: 175 10*3/uL (ref 150–400)
RBC: 3.81 MIL/uL — ABNORMAL LOW (ref 4.22–5.81)
RDW: 13.5 % (ref 11.5–15.5)
WBC: 5.9 10*3/uL (ref 4.0–10.5)

## 2017-03-28 LAB — BASIC METABOLIC PANEL
ANION GAP: 10 (ref 5–15)
BUN: 22 mg/dL — ABNORMAL HIGH (ref 6–20)
CALCIUM: 9.2 mg/dL (ref 8.9–10.3)
CHLORIDE: 104 mmol/L (ref 101–111)
CO2: 22 mmol/L (ref 22–32)
Creatinine, Ser: 1.01 mg/dL (ref 0.61–1.24)
GFR calc non Af Amer: >60 mL/min (ref >60)
GLUCOSE: 373 mg/dL — AB (ref 65–99)
Potassium: 4.4 mmol/L (ref 3.5–5.1)
Sodium: 136 mmol/L (ref 135–145)

## 2017-03-28 MED ORDER — SODIUM CHLORIDE 0.9 % IV BOLUS (SEPSIS)
1000.0000 mL | Freq: Once | INTRAVENOUS | Status: AC
  Administered 2017-03-29: 1000 mL via INTRAVENOUS

## 2017-03-28 MED ORDER — INSULIN ASPART 100 UNIT/ML ~~LOC~~ SOLN
8.0000 [IU] | Freq: Once | SUBCUTANEOUS | Status: AC
  Administered 2017-03-29: 8 [IU] via SUBCUTANEOUS
  Filled 2017-03-28: qty 1

## 2017-03-28 NOTE — ED Notes (Signed)
Awaiting eval

## 2017-03-28 NOTE — ED Triage Notes (Signed)
Pt reports diabetic and has 2 toes with ulcers  to the left foot with drainage, ulcer on 1 toe to the right foot that is red no drainage, pt reports problem has been ongoing for years and he has seen "every doctor in the [place]" and has been told "it's normal"

## 2017-03-28 NOTE — ED Notes (Signed)
Pt reports toe ulcers "for years" - has been referred to wound centers, has seen podiatrist in past  Wound to L foot appears to have healed dry skin with discolored skin that is more resembling from dye or other reason as the toes appear dry and healed

## 2017-03-28 NOTE — ED Notes (Signed)
Pt in no distress  Hassan Rowan, RN, CN in to assess toes

## 2017-03-29 LAB — CBG MONITORING, ED: GLUCOSE-CAPILLARY: 243 mg/dL — AB (ref 65–99)

## 2017-03-29 LAB — HEMOGLOBIN A1C
Hgb A1c MFr Bld: 7.1 % — ABNORMAL HIGH (ref 4.8–5.6)
Mean Plasma Glucose: 157.07 mg/dL

## 2017-03-29 MED ORDER — CEPHALEXIN 500 MG PO CAPS
500.0000 mg | ORAL_CAPSULE | Freq: Once | ORAL | Status: AC
  Administered 2017-03-29: 500 mg via ORAL
  Filled 2017-03-29: qty 1

## 2017-03-29 MED ORDER — SULFAMETHOXAZOLE-TRIMETHOPRIM 800-160 MG PO TABS
1.0000 | ORAL_TABLET | Freq: Once | ORAL | Status: AC
  Administered 2017-03-29: 1 via ORAL
  Filled 2017-03-29: qty 1

## 2017-03-29 MED ORDER — HYDROCODONE-ACETAMINOPHEN 5-325 MG PO TABS
1.0000 | ORAL_TABLET | Freq: Once | ORAL | Status: AC
  Administered 2017-03-29: 1 via ORAL
  Filled 2017-03-29: qty 1

## 2017-03-29 MED ORDER — SULFAMETHOXAZOLE-TRIMETHOPRIM 800-160 MG PO TABS: 1.0000 | ORAL_TABLET | Freq: Two times a day (BID) | ORAL | 0 refills | Status: AC

## 2017-03-29 MED ORDER — CEPHALEXIN 500 MG PO CAPS: 500.0000 mg | ORAL_CAPSULE | Freq: Four times a day (QID) | ORAL | 0 refills | Status: DC

## 2017-03-29 MED ORDER — HYDROCODONE-ACETAMINOPHEN 5-325 MG PO TABS: 1.0000 | ORAL_TABLET | ORAL | 0 refills | Status: DC | PRN

## 2017-03-29 NOTE — ED Notes (Signed)
Upon entering patient's room, made him aware that I needed to do a CBG.  Patient made the comment that we are a bunch of clowns and that he has been here too long.  Checked CBG  Results 243. Patient made comment "I dont know how in the hell my blood sugar is that high. All I had was some stale crackers and Diet Dr Malachi Bonds. If I get to throwing up yall gonna clean it up."

## 2017-03-29 NOTE — Discharge Instructions (Addendum)
Soak your toes in warm (not hot) water for 15 minutes twice daily, then dry completely.  Take the antibiotics as prescribed.  You will benefit by seeing a podiatrist as discussed and have been referred to Dr Caprice Beaver.  However,  you may need to have your referral come from your primary doctor in order for your insurance to accept the referral. Keep a close watch on your blood glucose levels and see your doctor for a recheck of your blood glucose within one week. You may need to consider having these medications adjusted if your glucose levels remain. You may take the hydrocodone prescribed for pain relief.  This will make you drowsy - do not drive within 4 hours of taking this medication.

## 2017-03-29 NOTE — ED Provider Notes (Signed)
[facility] EMERGENCY DEPARTMENT Provider Note   CSN: [id number] Arrival date & time: 03/28/17  [place]     History   Chief Complaint Chief Complaint  Patient presents with  . Toe Pain    HPI [name] is a 62 y.o. male with a history of DM, HTN and chronic wounds on several toes causing pain.  He has a wound on his left distal great toe and 2nd toe which causes pain and intermittent bleeding, denies purulent drainage from the sites.  He also has chronic intermittent pain and swelling of his right great toe. He describes the toe swells up, his toenail loosens and falls off (most recently was here 2 months ago with this problem and his nail was removed here).  He denies fevers, chills or radiation of pain and denies trauma to either foot.  He is followed by [facility] in [place].  He reports checking his cbg's daily and has not missed his DM medicines but his cbg's generally run high. He has tried to get in with a podiatrist with no success.   The history is provided by the patient.    Past Medical History:  Diagnosis Date  . Arthritis   . Back pain   . Diabetes mellitus without complication ([place])   . Hypertension     There are no active problems to display for this patient.   Past Surgical History:  Procedure Laterality Date  . BACK SURGERY    . CARPAL TUNNEL RELEASE    . CHOLECYSTECTOMY    . FOOT SURGERY    . JOINT REPLACEMENT    . SHOULDER ARTHROSCOPY    . TOTAL KNEE ARTHROPLASTY         Home Medications    Prior to Admission medications   Medication Sig Start Date End Date Taking? Authorizing Provider  aspirin EC 81 MG tablet Take 81 mg by mouth daily.   Yes [provider]  lisinopril (PRINIVIL,ZESTRIL) 40 MG tablet Take 40 mg by mouth daily. 03/05/16  Yes [provider]  metFORMIN (GLUCOPHAGE) 500 MG tablet Take 500 mg by mouth 2 (two) times daily with a meal. 02/13/16  Yes [provider]  metoprolol (LOPRESSOR) 100 MG  tablet Take 100 mg by mouth 2 (two) times daily. 09/02/13  Yes [provider]  amLODipine (NORVASC) 10 MG tablet Take 10 mg by mouth daily. 02/08/16   [provider]  cephALEXin (KEFLEX) 500 MG capsule Take 1 capsule (500 mg total) by mouth 4 (four) times daily. 03/29/17   Evalee Jefferson, PA-C  hydrochlorothiazide (HYDRODIURIL) 25 MG tablet Take 25 mg by mouth daily. 09/02/13   [provider]  HYDROcodone-acetaminophen (NORCO/VICODIN) 5-325 MG tablet Take 1 tablet by mouth every 4 (four) hours as needed. 03/16/16   Shaima Sardinas, Almyra Free, PA-C  LEVEMIR FLEXTOUCH 100 UNIT/ML Pen Inject 80 Units into the skin 2 (two) times daily. 10/08/13   [provider]  sulfamethoxazole-trimethoprim (BACTRIM DS,SEPTRA DS) 800-160 MG tablet Take 1 tablet by mouth 2 (two) times daily for 10 days. 03/29/17 04/08/17  Evalee Jefferson, PA-C  tamsulosin (FLOMAX) 0.4 MG CAPS capsule Take 1 capsule (0.4 mg total) by mouth daily after supper. 03/16/16   Evalee Jefferson, PA-C    Family History Family History  Problem Relation Age of Onset  . Heart failure Mother   . Cancer Father     Social History Social History   Tobacco Use  . Smoking status: Never Smoker  . Smokeless tobacco: Never Used  Substance Use Topics  . Alcohol use: No  . Drug use: No     Allergies   Other; Pravastatin; Prednisone; Toradol [ketorolac tromethamine]; and Ultram [tramadol]   Review of Systems Review of Systems  Constitutional: Negative for fever.  HENT: Negative for congestion and sore throat.   Eyes: Negative.   Respiratory: Negative for chest tightness and shortness of breath.   Cardiovascular: Negative for chest pain.  Gastrointestinal: Negative for abdominal pain and nausea.  Genitourinary: Negative.   Musculoskeletal: Positive for arthralgias. Negative for joint swelling and neck pain.  Skin: Positive for color change and wound. Negative for rash.  Neurological: Negative for dizziness, weakness,  light-headedness, numbness and headaches.  Psychiatric/Behavioral: Negative.      Physical Exam Updated Vital Signs BP (!) 142/91   Pulse 74   Temp 98.6 F (37 C) (Oral)   Resp 18   Ht 6' (1.829 m)   Wt 108.9 kg (240 lb)   SpO2 96%   BMI 32.55 kg/m   Physical Exam  Constitutional: He is oriented to person, place, and time. He appears well-developed and well-nourished.  HENT:  Head: Normocephalic.  Cardiovascular: Normal rate.  Pulmonary/Chest: Effort normal.  Musculoskeletal: He exhibits tenderness.  ttp bilateral great toes and left 2nd toe. No deformity.  Neurological: He is alert and oriented to person, place, and time. No sensory deficit.  Skin: Lesion noted. No laceration noted. There is erythema.  Mild erythema of right great toe and left 2nd toe. 2nd toe left foot has desquamation of superficial layer of skin over the distal phalanx.  Shallow pink based ulcer distal left great toe. No purulent drainage.  Right great toe is edematous with erythema not extending onto the foot.   Distal sensation intact in all toes with less than 2 sec cap refill.      ED Treatments / Results  Labs (all labs ordered are listed, but only abnormal results are displayed) Labs Reviewed  BASIC METABOLIC PANEL - Abnormal; Notable for the following components:      Result Value   Glucose, Bld 373 (*)    BUN 22 (*)    All other components within normal limits  CBC WITH DIFFERENTIAL/PLATELET - Abnormal; Notable for the following components:   RBC 3.81 (*)    Hemoglobin 12.1 (*)    HCT 34.8 (*)    All other components within normal limits  CBG MONITORING, ED - Abnormal; Notable for the following components:   Glucose-Capillary 243 (*)    All other components within normal limits  HEMOGLOBIN A1C    EKG  EKG Interpretation None       Radiology Dg Foot Complete Left  Result Date: 03/29/2017 CLINICAL DATA:  Acute onset of left great toe wound. Initial encounter. EXAM: LEFT FOOT -  COMPLETE 3+ VIEW COMPARISON:  Left foot radiographs performed 07/11/2016, and left foot MRI performed 07/12/2016 FINDINGS: A soft tissue defect is noted at the distal tip of the great toe. No osseous erosions are seen. No radiopaque foreign bodies are identified. There is no evidence of fracture or dislocation. Mild degenerative change is noted at the first metatarsophalangeal joint. An os naviculare is noted. An os peroneum is seen. Plantar and posterior calcaneal spurs are seen. The subtalar joint is unremarkable. IMPRESSION: No osseous erosions seen.  No radiopaque foreign bodies identified. Electronically Signed   By: Garald Balding M.D.   On: 03/29/2017 00:26   Dg Foot Complete Right  Result Date: 03/29/2017 CLINICAL DATA:  Acute  onset of wounds at the right first and second toes. EXAM: RIGHT FOOT COMPLETE - 3+ VIEW COMPARISON:  Right foot MRI performed 07/12/2016, and radiographs of the right first toe on 10/16/2015 FINDINGS: There is no evidence of fracture or dislocation. No osseous erosions are seen. No radiopaque foreign bodies are identified. The joint spaces are preserved. There is no evidence of talar subluxation; the subtalar joint is unremarkable in appearance. Plantar and posterior calcaneal spurs are seen. Calcification is noted about the plantar fascia. The known soft tissue defects are not well characterized on radiograph. IMPRESSION: No osseous erosions seen.  No radiopaque foreign bodies identified. Electronically Signed   By: Garald Balding M.D.   On: 03/29/2017 00:28    Procedures Procedures (including critical care time)  Medications Ordered in ED Medications  insulin aspart (novoLOG) injection 8 Units (8 Units Subcutaneous Given 03/29/17 0027)  sodium chloride 0.9 % bolus 1,000 mL (1,000 mLs Intravenous New Bag/Given 03/29/17 0034)  cephALEXin (KEFLEX) capsule 500 mg (500 mg Oral Given 03/29/17 0116)     Initial Impression / Assessment and Plan / ED Course  I have reviewed  the triage vital signs and the nursing notes.  Pertinent labs & imaging results that were available during my care of the patient were reviewed by me and considered in my medical decision making (see chart for details).     Labs and imaging reviewed. Pt with chronic ulcers/ wounds on several toes with uncontrolled DM, no ketosis.  He was placed on bactrim and keflex, discussed home care and close f/u with podiatry, referral given.  No osteomyelitis per imaging.  No constitutional sx to suggest sepsis.  Foot findings are mild and chronic but will need close f/u care.   Final Clinical Impressions(s) / ED Diagnoses   Final diagnoses:  Infection of skin of toes  Hyperglycemia    ED Discharge Orders        Ordered    cephALEXin (KEFLEX) 500 MG capsule  4 times daily     03/29/17 0148    sulfamethoxazole-trimethoprim (BACTRIM DS,SEPTRA DS) 800-160 MG tablet  2 times daily     03/29/17 0148       Evalee Jefferson, PA-C 03/29/17 0151    Rolland Porter, MD 03/29/17 [phone]

## 2017-04-02 ENCOUNTER — Encounter: Payer: Medicaid Other | Admitting: Podiatry

## 2017-05-29 NOTE — Progress Notes (Signed)
This encounter was created in error - please disregard.

## 2017-07-22 NOTE — Telephone Encounter (Signed)
Spoke with client, is an insulin dependent diabetic is out of insulin. Receives insulin through a  Program through [employer] and has ordered medication. Spoke with social worker Johnny Yow connected to [employer], client Is alreaady being served through [employer] who follow up with client to assist with getting insulin.  Information given to client.  Mariam Dollar, RN  [phone].

## 2017-09-30 ENCOUNTER — Encounter (HOSPITAL_COMMUNITY): Payer: Self-pay | Admitting: Emergency Medicine

## 2017-09-30 ENCOUNTER — Other Ambulatory Visit: Payer: Self-pay

## 2017-09-30 ENCOUNTER — Emergency Department (HOSPITAL_COMMUNITY)
Admission: EM | Admit: 2017-09-30 | Discharge: 2017-10-01 | Disposition: A | Discharge status: Home / Self Care | Payer: Medicaid Other | Attending: Emergency Medicine | Admitting: Emergency Medicine

## 2017-09-30 DIAGNOSIS — Z794 Long term (current) use of insulin: Secondary | ICD-10-CM | POA: Insufficient documentation

## 2017-09-30 DIAGNOSIS — E119 Type 2 diabetes mellitus without complications: Secondary | ICD-10-CM | POA: Diagnosis not present

## 2017-09-30 DIAGNOSIS — Z79899 Other long term (current) drug therapy: Secondary | ICD-10-CM | POA: Insufficient documentation

## 2017-09-30 DIAGNOSIS — M7989 Other specified soft tissue disorders: Secondary | ICD-10-CM

## 2017-09-30 DIAGNOSIS — I10 Essential (primary) hypertension: Secondary | ICD-10-CM | POA: Diagnosis not present

## 2017-09-30 DIAGNOSIS — R2242 Localized swelling, mass and lump, left lower limb: Secondary | ICD-10-CM | POA: Diagnosis not present

## 2017-09-30 DIAGNOSIS — M79662 Pain in left lower leg: Secondary | ICD-10-CM

## 2017-09-30 DIAGNOSIS — Z7982 Long term (current) use of aspirin: Secondary | ICD-10-CM | POA: Diagnosis not present

## 2017-09-30 LAB — COMPREHENSIVE METABOLIC PANEL
ALBUMIN: 3.6 g/dL (ref 3.5–5.0)
ALK PHOS: 57 U/L (ref 38–126)
ALT: 14 U/L (ref 0–44)
AST: 18 U/L (ref 15–41)
Anion gap: 8 (ref 5–15)
BILIRUBIN TOTAL: 0.7 mg/dL (ref 0.3–1.2)
BUN: 10 mg/dL (ref 6–20)
CALCIUM: 9.1 mg/dL (ref 8.9–10.3)
CO2: 23 mmol/L (ref 22–32)
Chloride: 108 mmol/L (ref 98–111)
Creatinine, Ser: 1 mg/dL (ref 0.61–1.24)
GFR calc Af Amer: >60 mL/min (ref >60)
GFR calc non Af Amer: >60 mL/min (ref >60)
GLUCOSE: 166 mg/dL — AB (ref 70–99)
Potassium: 3.7 mmol/L (ref 3.5–5.1)
Sodium: 139 mmol/L (ref 135–145)
Total Protein: 6.5 g/dL (ref 6.5–8.1)

## 2017-09-30 LAB — CBC WITH DIFFERENTIAL/PLATELET
Abs Immature Granulocytes: 0 10*3/uL (ref 0.0–0.1)
BASOS ABS: 0 10*3/uL (ref 0.0–0.1)
BASOS PCT: 0 %
EOS PCT: 2 %
Eosinophils Absolute: 0.1 10*3/uL (ref 0.0–0.7)
HEMATOCRIT: 33.2 % — AB (ref 39.0–52.0)
HEMOGLOBIN: 11.8 g/dL — AB (ref 13.0–17.0)
Immature Granulocytes: 0 %
LYMPHS PCT: 38 %
Lymphs Abs: 2.1 10*3/uL (ref 0.7–4.0)
MCH: 33.3 pg (ref 26.0–34.0)
MCHC: 35.5 g/dL (ref 30.0–36.0)
MCV: 93.8 fL (ref 78.0–100.0)
MONO ABS: 0.3 10*3/uL (ref 0.1–1.0)
Monocytes Relative: 6 %
Neutro Abs: 3 10*3/uL (ref 1.7–7.7)
Neutrophils Relative %: 54 %
Platelets: 161 10*3/uL (ref 150–400)
RBC: 3.54 MIL/uL — ABNORMAL LOW (ref 4.22–5.81)
RDW: 12.6 % (ref 11.5–15.5)
WBC: 5.6 10*3/uL (ref 4.0–10.5)

## 2017-09-30 LAB — I-STAT CG4 LACTIC ACID, ED: Lactic Acid, Venous: 1.3 mmol/L (ref 0.5–1.9)

## 2017-09-30 NOTE — ED Triage Notes (Signed)
Pt c/o increased leg swelling and pain after being bitten by dog on August 22. Pt reports he completed his rabies course and abx.

## 2017-10-01 NOTE — ED Provider Notes (Signed)
[facility] EMERGENCY DEPARTMENT Provider Note  CSN: [id number] Arrival date & time: 09/30/17 2043  Chief Complaint(s) Leg Pain  HPI [name] is a 62 y.o. male with a history of diabetes who was recently treated for dog bite to the lower extremities last month presents to the emergency department with persistent leg pain and swelling, L>R.  Patient reports that the pain is been ongoing since the dog bites.  Described as aching soreness.  States that he is been evaluated multiple times by his primary care provider and obtain an ultrasound ruling out DVT.  Patient reports trying compression stockings and elevation for the swelling with minimal improvement.  Denied any new trauma.  Denies any difficulty ambulating.  No associated fevers or chills.  No chest pain or shortness of breath.  HPI  Past Medical History Past Medical History:  Diagnosis Date  . Arthritis   . Back pain   . Diabetes mellitus without complication ([place])   . Hypertension    Patient Active Problem List   Diagnosis Date Noted  . Myelopathy ([place]) 07/16/2013  . Ingrown nail 07/05/2013  . Paronychia 07/05/2013  . Diverticulosis 04/22/2013  . Acute osteomyelitis of humerus ([place]) 02/07/2013  . Osteomyelitis ([place]) 02/01/2013  . Joint infection ([place]) 01/22/2013  . Pyogenic arthritis of shoulder region ([place]) 01/15/2013  . Nephrolithiasis 01/23/2012  . Lumbar radiculopathy 02/28/2011  . Gout flare 04/30/2010  . Chronic pain 02/14/2010  . NASH (nonalcoholic steatohepatitis) 11/08/2008  . Obesity 10/27/2008  . Hypertriglyceridemia 10/27/2007  . DM (diabetes mellitus), type 2, uncontrolled ([place]) 03/12/2006  . Essential hypertension, benign 03/12/2006   Home Medication(s) Prior to Admission medications   Medication Sig Start Date End Date Taking? Authorizing Provider  amLODipine (NORVASC) 10 MG tablet Take 10 mg by mouth daily. 02/08/16   [provider]  aspirin EC 81 MG tablet Take 81  mg by mouth daily.    [provider]  cephALEXin (KEFLEX) 500 MG capsule Take 1 capsule (500 mg total) by mouth 4 (four) times daily. 03/29/17   Evalee Jefferson, PA-C  hydrochlorothiazide (HYDRODIURIL) 25 MG tablet Take 25 mg by mouth daily. 09/02/13   [provider]  HYDROcodone-acetaminophen (NORCO/VICODIN) 5-325 MG tablet Take 1 tablet by mouth every 4 (four) hours as needed. 03/29/17   Idol, Almyra Free, PA-C  LEVEMIR FLEXTOUCH 100 UNIT/ML Pen Inject 80 Units into the skin 2 (two) times daily. 10/08/13   [provider]  lisinopril (PRINIVIL,ZESTRIL) 40 MG tablet Take 40 mg by mouth daily. 03/05/16   [provider]  metFORMIN (GLUCOPHAGE) 500 MG tablet Take 500 mg by mouth 2 (two) times daily with a meal. 02/13/16   [provider]  metoprolol (LOPRESSOR) 100 MG tablet Take 100 mg by mouth 2 (two) times daily. 09/02/13   [provider]  tamsulosin (FLOMAX) 0.4 MG CAPS capsule Take 1 capsule (0.4 mg total) by mouth daily after supper. 03/16/16   Evalee Jefferson, PA-C  Past Surgical History Past Surgical History:  Procedure Laterality Date  . BACK SURGERY    . CARPAL TUNNEL RELEASE    . CHOLECYSTECTOMY    . FOOT SURGERY    . JOINT REPLACEMENT    . SHOULDER ARTHROSCOPY    . TOTAL KNEE ARTHROPLASTY     Family History Family History  Problem Relation Age of Onset  . Heart failure Mother   . Cancer Father     Social History Social History   Tobacco Use  . Smoking status: Never Smoker  . Smokeless tobacco: Never Used  Substance Use Topics  . Alcohol use: No  . Drug use: No   Allergies Other; Pravastatin; Prednisone; Toradol [ketorolac tromethamine]; and Ultram [tramadol]  Review of Systems Review of Systems All other systems are reviewed and are negative for acute change except as noted in the HPI  Physical  Exam Vital Signs  I have reviewed the triage vital signs BP 124/82 (BP Location: Left Arm)   Pulse (!) 58   Temp 98.7 F (37.1 C) (Oral)   Resp 16   SpO2 100%   Physical Exam  Constitutional: He is oriented to person, place, and time. He appears well-developed and well-nourished. No distress.  HENT:  Head: Normocephalic and atraumatic.  Right Ear: External ear normal.  Left Ear: External ear normal.  Nose: Nose normal.  Mouth/Throat: Mucous membranes are normal. No trismus in the jaw.  Eyes: Conjunctivae and EOM are normal. No scleral icterus.  Neck: Normal range of motion and phonation normal.  Cardiovascular: Normal rate and regular rhythm.  Pulses:      Dorsalis pedis pulses are 2+ on the right side, and 2+ on the left side.  Pulmonary/Chest: Effort normal. No stridor. No respiratory distress.  Abdominal: He exhibits no distension.  Musculoskeletal: Normal range of motion.       Right lower leg: He exhibits tenderness (mild discomfort) and edema. He exhibits no bony tenderness and no deformity.       Left lower leg: He exhibits tenderness (mild discomfort) and edema. He exhibits no bony tenderness and no deformity.       Legs: Neurological: He is alert and oriented to person, place, and time.  Skin: He is not diaphoretic.  Psychiatric: He has a normal mood and affect. His behavior is normal.  Vitals reviewed.   ED Results and Treatments Labs (all labs ordered are listed, but only abnormal results are displayed) Labs Reviewed  COMPREHENSIVE METABOLIC PANEL - Abnormal; Notable for the following components:      Result Value   Glucose, Bld 166 (*)    All other components within normal limits  CBC WITH DIFFERENTIAL/PLATELET - Abnormal; Notable for the following components:   RBC 3.54 (*)    Hemoglobin 11.8 (*)    HCT 33.2 (*)    All other components within normal limits  I-STAT CG4 LACTIC ACID, ED  EKG  EKG Interpretation  Date/Time:    Ventricular Rate:    PR Interval:    QRS Duration:   QT Interval:    QTC Calculation:   R Axis:     Text Interpretation:        Radiology No results found. Pertinent labs & imaging results that were available during my care of the patient were reviewed by me and considered in my medical decision making (see chart for details).  Medications Ordered in ED Medications - No data to display                                                                                                                                  Procedures Procedures  (including critical care time)  Medical Decision Making / ED Course I have reviewed the nursing notes for this encounter and the patient's prior records (if available in EHR or on provided paperwork).    Persistent lower extremity pain for several weeks.  Patient has already been ruled out for DVT.  Pulses intact, doubt arterial occlusion.  No evidence of infectious etiology.  Screening labs obtained during the first look process will grossly reassuring without leukocytosis.  No significant electrolyte derangements or renal insufficiency.  Continued supportive management with PCP follow up.  The patient appears reasonably screened and/or stabilized for discharge and I doubt any other medical condition or other [facility] requiring further screening, evaluation, or treatment in the ED at this time prior to discharge.  The patient is safe for discharge with strict return precautions.   Final Clinical Impression(s) / ED Diagnoses Final diagnoses:  Pain and swelling of left lower leg   Disposition: Discharge  Condition: Good  I have discussed the results, Dx and Tx plan with the patient who expressed understanding and agree(s) with the plan. Discharge instructions discussed at great length. The patient was given strict return precautions who verbalized understanding  of the instructions. No further questions at time of discharge.    ED Discharge Orders    None       Follow Up: Primary care provider  Schedule an appointment as soon as possible for a visit  If symptoms do not improve or  worsen      This chart was dictated using voice recognition software.  Despite best efforts to proofread,  errors can occur which can change the documentation meaning.   Fatima Blank, MD 10/01/17 2302

## 2017-10-01 NOTE — ED Notes (Signed)
Dr. Leonette Monarch ( EDP) at bedside .

## 2017-12-26 ENCOUNTER — Emergency Department (HOSPITAL_COMMUNITY)
Admission: EM | Admit: 2017-12-26 | Discharge: 2017-12-26 | Disposition: A | Discharge status: Home / Self Care | Payer: Medicaid Other | Attending: Emergency Medicine | Admitting: Emergency Medicine

## 2017-12-26 ENCOUNTER — Other Ambulatory Visit: Payer: Self-pay

## 2017-12-26 ENCOUNTER — Emergency Department (HOSPITAL_COMMUNITY): Payer: Medicaid Other

## 2017-12-26 ENCOUNTER — Encounter (HOSPITAL_COMMUNITY): Payer: Self-pay

## 2017-12-26 DIAGNOSIS — R2241 Localized swelling, mass and lump, right lower limb: Secondary | ICD-10-CM | POA: Diagnosis not present

## 2017-12-26 DIAGNOSIS — Z7982 Long term (current) use of aspirin: Secondary | ICD-10-CM | POA: Insufficient documentation

## 2017-12-26 DIAGNOSIS — Z79899 Other long term (current) drug therapy: Secondary | ICD-10-CM | POA: Diagnosis not present

## 2017-12-26 DIAGNOSIS — R2242 Localized swelling, mass and lump, left lower limb: Secondary | ICD-10-CM | POA: Diagnosis not present

## 2017-12-26 DIAGNOSIS — I10 Essential (primary) hypertension: Secondary | ICD-10-CM | POA: Diagnosis not present

## 2017-12-26 DIAGNOSIS — Z7984 Long term (current) use of oral hypoglycemic drugs: Secondary | ICD-10-CM | POA: Insufficient documentation

## 2017-12-26 DIAGNOSIS — E1165 Type 2 diabetes mellitus with hyperglycemia: Secondary | ICD-10-CM | POA: Insufficient documentation

## 2017-12-26 DIAGNOSIS — R739 Hyperglycemia, unspecified: Secondary | ICD-10-CM

## 2017-12-26 DIAGNOSIS — R600 Localized edema: Secondary | ICD-10-CM

## 2017-12-26 LAB — BASIC METABOLIC PANEL
Anion gap: 7 (ref 5–15)
BUN: 16 mg/dL (ref 6–20)
CHLORIDE: 103 mmol/L (ref 98–111)
CO2: 28 mmol/L (ref 22–32)
CREATININE: 0.94 mg/dL (ref 0.61–1.24)
Calcium: 8.9 mg/dL (ref 8.9–10.3)
GFR calc Af Amer: >60 mL/min (ref >60)
GFR calc non Af Amer: >60 mL/min (ref >60)
GLUCOSE: 240 mg/dL — AB (ref 70–99)
Potassium: 4.2 mmol/L (ref 3.5–5.1)
SODIUM: 138 mmol/L (ref 135–145)

## 2017-12-26 LAB — CBC WITH DIFFERENTIAL/PLATELET
Abs Immature Granulocytes: 0.04 10*3/uL (ref 0.00–0.07)
Basophils Absolute: 0 10*3/uL (ref 0.0–0.1)
Basophils Relative: 0 %
EOS PCT: 2 %
Eosinophils Absolute: 0.1 10*3/uL (ref 0.0–0.5)
HCT: 34.1 % — ABNORMAL LOW (ref 39.0–52.0)
HEMOGLOBIN: 11.7 g/dL — AB (ref 13.0–17.0)
Immature Granulocytes: 1 %
Lymphocytes Relative: 23 %
Lymphs Abs: 1.5 10*3/uL (ref 0.7–4.0)
MCH: 32.2 pg (ref 26.0–34.0)
MCHC: 34.3 g/dL (ref 30.0–36.0)
MCV: 93.9 fL (ref 80.0–100.0)
MONO ABS: 0.4 10*3/uL (ref 0.1–1.0)
MONOS PCT: 6 %
Neutro Abs: 4.3 10*3/uL (ref 1.7–7.7)
Neutrophils Relative %: 68 %
Platelets: 204 10*3/uL (ref 150–400)
RBC: 3.63 MIL/uL — AB (ref 4.22–5.81)
RDW: 12.3 % (ref 11.5–15.5)
WBC: 6.3 10*3/uL (ref 4.0–10.5)
nRBC: 0 % (ref 0.0–0.2)

## 2017-12-26 NOTE — ED Notes (Signed)
Patient given discharge instruction, verbalized understand. IV removed, band aid applied. Patient ambulatory out of the department.

## 2017-12-26 NOTE — ED Triage Notes (Signed)
Pt reports swelling and pain to bilateral lower extremities.  Says was admitted to [facility] and d/c'd on 12/9.

## 2017-12-26 NOTE — ED Provider Notes (Signed)
[facility] EMERGENCY DEPARTMENT Provider Note   CSN: [id number] Arrival date & time: 12/26/17  2725     History   Chief Complaint Chief Complaint  Patient presents with  . Leg Swelling    HPI [name] is a 61 y.o. male.  Patient presents for bilateral leg swelling.  Patient just discharged from hospital in [facility] on December 9.  Paperwork from there replies that he was admitted for sepsis and the bilateral leg swelling.  He had extensive work-up there to include Doppler studies that were negative he has sounds as if there was bilateral leg cellulitis as well.  He is currently on outpatient antibiotics.  Patient is concerned that the swelling is still there otherwise he does not feel any worse.  Patient has family in the [place] area but lives here in the [place] area.  Has a primary care doctor in [place].  Patient also known to have a history of hypertension and diabetes.  Patient wants the leg swelling evaluated he would like to know why he has that.  Patient denies any shortness of breath or chest pain denies any fevers.  Patient is on Keflex.     Past Medical History:  Diagnosis Date  . Arthritis   . Back pain   . Diabetes mellitus without complication ([place])   . Hypertension     Patient Active Problem List   Diagnosis Date Noted  . Myelopathy ([place]) 07/16/2013  . Ingrown nail 07/05/2013  . Paronychia 07/05/2013  . Diverticulosis 04/22/2013  . Acute osteomyelitis of humerus ([place]) 02/07/2013  . Osteomyelitis ([place]) 02/01/2013  . Joint infection ([place]) 01/22/2013  . Pyogenic arthritis of shoulder region ([place]) 01/15/2013  . Nephrolithiasis 01/23/2012  . Lumbar radiculopathy 02/28/2011  . Gout flare 04/30/2010  . Chronic pain 02/14/2010  . NASH (nonalcoholic steatohepatitis) 11/08/2008  . Obesity 10/27/2008  . Hypertriglyceridemia 10/27/2007  . DM (diabetes mellitus), type 2, uncontrolled ([place]) 03/12/2006  . Essential hypertension, benign 03/12/2006     Past Surgical History:  Procedure Laterality Date  . BACK SURGERY    . CARPAL TUNNEL RELEASE    . CHOLECYSTECTOMY    . FOOT SURGERY    . JOINT REPLACEMENT    . SHOULDER ARTHROSCOPY    . TOTAL KNEE ARTHROPLASTY          Home Medications    Prior to Admission medications   Medication Sig Start Date End Date Taking? Authorizing Provider  amLODipine (NORVASC) 10 MG tablet Take 5 mg by mouth daily.  02/08/16  Yes [provider]  aspirin EC 81 MG tablet Take 81 mg by mouth daily.   Yes [provider]  cephALEXin (KEFLEX) 500 MG capsule Take 1 capsule (500 mg total) by mouth 4 (four) times daily. 03/29/17  Yes Idol, Almyra Free, PA-C  HYDROcodone-acetaminophen (NORCO/VICODIN) 5-325 MG tablet Take 1 tablet by mouth every 4 (four) hours as needed. 03/29/17  Yes Idol, Almyra Free, PA-C  LEVEMIR FLEXTOUCH 100 UNIT/ML Pen Inject 80 Units into the skin 2 (two) times daily. 10/08/13  Yes [provider]  lisinopril (PRINIVIL,ZESTRIL) 40 MG tablet Take 40 mg by mouth daily. 03/05/16  Yes [provider]  metFORMIN (GLUCOPHAGE) 500 MG tablet Take 1,000 mg by mouth 2 (two) times daily with a meal.  02/13/16  Yes [provider]  metoprolol (LOPRESSOR) 100 MG tablet Take 100 mg by mouth 2 (two) times daily. 09/02/13  Yes [provider]  hydrochlorothiazide (HYDRODIURIL) 25 MG tablet Take 25 mg by mouth  daily. 09/02/13   [provider]  tamsulosin (FLOMAX) 0.4 MG CAPS capsule Take 1 capsule (0.4 mg total) by mouth daily after supper. Patient not taking: Reported on 12/26/2017 03/16/16   Evalee Jefferson, PA-C    Family History Family History  Problem Relation Age of Onset  . Heart failure Mother   . Cancer Father     Social History Social History   Tobacco Use  . Smoking status: Never Smoker  . Smokeless tobacco: Never Used  Substance Use Topics  . Alcohol use: No  . Drug use: No     Allergies   Other; Pravastatin; Prednisone; Toradol  [ketorolac tromethamine]; and Ultram [tramadol]   Review of Systems Review of Systems  Constitutional: Negative for chills and fever.  HENT: Negative for congestion.   Eyes: Negative for visual disturbance.  Respiratory: Negative for shortness of breath.   Cardiovascular: Positive for leg swelling. Negative for chest pain.  Gastrointestinal: Negative for abdominal pain.  Genitourinary: Negative for dysuria.  Musculoskeletal: Negative for back pain.  Skin: Negative for rash.  Neurological: Negative for speech difficulty, weakness, numbness and headaches.  Hematological: Does not bruise/bleed easily.  Psychiatric/Behavioral: Negative for confusion.     Physical Exam Updated Vital Signs BP (!) 143/85   Pulse 73   Temp 98.5 F (36.9 C) (Oral)   Resp 20   Ht 1.905 m (6' 3" )   Wt 113.4 kg   SpO2 100%   BMI 31.25 kg/m   Physical Exam Vitals signs and nursing note reviewed.  Constitutional:      General: He is not in acute distress.    Appearance: Normal appearance. He is not toxic-appearing.  HENT:     Head: Normocephalic and atraumatic.     Mouth/Throat:     Mouth: Mucous membranes are moist.  Eyes:     Pupils: Pupils are equal, round, and reactive to light.  Neck:     Musculoskeletal: Normal range of motion and neck supple.  Cardiovascular:     Rate and Rhythm: Normal rate and regular rhythm.  Pulmonary:     Effort: Pulmonary effort is normal. No respiratory distress.     Breath sounds: Normal breath sounds. No wheezing.  Abdominal:     General: Bowel sounds are normal.     Tenderness: There is no abdominal tenderness.  Musculoskeletal:        General: Swelling present.     Right lower leg: Edema present.     Left lower leg: Edema present.  Skin:    General: Skin is warm.     Capillary Refill: Capillary refill takes less than 2 seconds.     Findings: No erythema.  Neurological:     General: No focal deficit present.     Mental Status: He is alert and  oriented to person, place, and time.      ED Treatments / Results  Labs (all labs ordered are listed, but only abnormal results are displayed) Labs Reviewed  CBC WITH DIFFERENTIAL/PLATELET - Abnormal; Notable for the following components:      Result Value   RBC 3.63 (*)    Hemoglobin 11.7 (*)    HCT 34.1 (*)    All other components within normal limits  BASIC METABOLIC PANEL - Abnormal; Notable for the following components:   Glucose, Bld 240 (*)    All other components within normal limits    EKG None  Radiology Dg Chest 2 View  Result Date: 12/26/2017 CLINICAL DATA:  Bilateral lower  leg swelling with productive cough. Smoker. EXAM: CHEST - 2 VIEW COMPARISON:  06/30/2017 FINDINGS: Lungs are adequately inflated without consolidation or effusion. Cardiomediastinal silhouette and remainder of the exam is unchanged. IMPRESSION: No active cardiopulmonary disease. Electronically Signed   By: Marin Olp M.D.   On: 12/26/2017 10:47    Procedures Procedures (including critical care time)  Medications Ordered in ED Medications - No data to display   Initial Impression / Assessment and Plan / ED Course  I have reviewed the triage vital signs and the nursing notes.  Pertinent labs & imaging results that were available during my care of the patient were reviewed by me and considered in my medical decision making (see chart for details).    Patient's work-up here chest x-ray negative for any fluid overload.  Labs without significant abnormalities other than blood sugar is elevated.  Initial blood pressure here was elevated but is improved while he was in the ED.  Patient is not toxic.  There is no fever there is appears to be no worsening of the cellulitis.  No evidence of fluid overload in the lungs kidney functions normal.  Recommend follow-up with a primary care provider for further evaluation.  Of both blood pressure and blood sugars.  Today the blood sugar was 240.  No  evidence of any significant acidosis.  Vital signs patient afebrile blood pressure 136/78 which is better than what he arrived with.  Not tachycardic.  Final Clinical Impressions(s) / ED Diagnoses   Final diagnoses:  Bilateral lower extremity edema  Essential hypertension  Hyperglycemia    ED Discharge Orders    None       Fredia Sorrow, MD 12/26/17 [phone]

## 2017-12-26 NOTE — Discharge Instructions (Addendum)
Follow-up with your primary care doctor in the ED in the area for additional evaluation of the leg swelling and for follow-up of your blood sugars which were up a little bit today.  Blood pressure now normal but when you first came in it was slightly elevated so follow-up for that as well.  Return for any new or worse symptoms.

## 2019-02-08 ENCOUNTER — Other Ambulatory Visit: Payer: Self-pay

## 2019-02-08 ENCOUNTER — Encounter (HOSPITAL_BASED_OUTPATIENT_CLINIC_OR_DEPARTMENT_OTHER): Payer: Medicaid Other | Attending: Internal Medicine | Admitting: Internal Medicine

## 2019-02-08 DIAGNOSIS — E11621 Type 2 diabetes mellitus with foot ulcer: Secondary | ICD-10-CM | POA: Diagnosis not present

## 2019-02-08 DIAGNOSIS — I10 Essential (primary) hypertension: Secondary | ICD-10-CM | POA: Diagnosis not present

## 2019-02-08 DIAGNOSIS — K7581 Nonalcoholic steatohepatitis (NASH): Secondary | ICD-10-CM | POA: Insufficient documentation

## 2019-02-08 DIAGNOSIS — E1140 Type 2 diabetes mellitus with diabetic neuropathy, unspecified: Secondary | ICD-10-CM | POA: Insufficient documentation

## 2019-02-08 DIAGNOSIS — M86672 Other chronic osteomyelitis, left ankle and foot: Secondary | ICD-10-CM | POA: Insufficient documentation

## 2019-02-08 DIAGNOSIS — L97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 of other part of left foot with fat layer exposed: Secondary | ICD-10-CM | POA: Insufficient documentation

## 2019-02-08 NOTE — Progress Notes (Signed)
Darrell, [name] ([id number]) Visit Report for 02/08/2019 Abuse/Suicide Risk Screen Details Patient Name: Date of Service: [name], [name] 02/08/2019 10:30 AM Medical Record [id number] Patient Account Number: [id number] Date of Birth/Sex: Treating RN: July 01, 1955 (64 y.o. [name]) Carlene Coria Primary Care Chimamanda Siegfried: SYSTEM, PCP Other Clinician: Referring Jackolyn Geron: Treating Christifer Chapdelaine/Extender:[name], [name] in Treatment: 0 Abuse/Suicide Risk Screen Items Answer ABUSE RISK SCREEN: Has anyone close to you tried to hurt or harm you recentlyo No Do you feel uncomfortable with anyone in your familyo No Has anyone forced you do things that you didnt want to doo No Electronic Signature(s) Signed: 02/08/2019 6:01:30 PM By: Carlene Coria RN Entered By: Carlene Coria on 02/08/2019 11:24:37 -------------------------------------------------------------------------------- Activities of Daily Living Details Patient Name: Date of Service: [name], [name] 02/08/2019 10:30 AM Medical Record [id number] Patient Account Number: [id number] Date of Birth/Sex: Treating RN: 17-Jun-1955 (64 y.o. [name]) Carlene Coria Primary Care Sharlize Hoar: SYSTEM, PCP Other Clinician: Referring Zahirah Cheslock: Treating Cheikh Bramble/Extender:[name], [name] in Treatment: 0 Activities of Daily Living Items Answer Activities of Daily Living (Please select one for each item) Drive Automobile Completely Able Take Medications Completely Able Use Telephone Completely Able Care for Appearance Completely Able Use Toilet Completely Able Bath / Shower Completely Able Dress Self Completely Able Feed Self Completely Able Walk Completely Able Get In / Out Bed Completely Able Housework Completely Able Prepare Meals Completely Able Handle Money Completely Able Shop for Self Completely Able Electronic Signature(s) Signed: 02/08/2019 6:01:30 PM By: Carlene Coria RN Entered By: Carlene Coria on 02/08/2019  11:25:10 -------------------------------------------------------------------------------- Education Screening Details Patient Name: Date of Service: [name], [name] 02/08/2019 10:30 AM Medical Record [name] Patient Account Number: [id number] Date of Birth/Sex: Treating RN: [birth year] (64 y.o. [name]) Carlene Coria Primary Care Shonya Sumida: SYSTEM, PCP Other Clinician: Referring Linn Clavin: Treating Elley Harp/Extender:[name], [name] in Treatment: 0 Primary Learner Assessed: Patient Learning Preferences/Education Level/Primary Language Learning Preference: Explanation Highest Education Level: High School Preferred Language: English Cognitive Barrier Language Barrier: No Translator Needed: No Memory Deficit: No Emotional Barrier: No Cultural/Religious Beliefs Affecting Medical Care: No Physical Barrier Impaired Vision: No Impaired Hearing: No Decreased Hand dexterity: No Knowledge/Comprehension Knowledge Level: Medium Comprehension Level: High Ability to understand written High instructions: Ability to understand verbal High instructions: Motivation Anxiety Level: Calm Cooperation: Cooperative Education Importance: Acknowledges Need Interest in Health Problems: Asks Questions Perception: Coherent Willingness to Engage in Self- High Management Activities: Readiness to Engage in Self- High Management Activities: Electronic Signature(s) Signed: 02/08/2019 6:01:30 PM By: Carlene Coria RN Entered By: Carlene Coria on 02/08/2019 11:25:44 -------------------------------------------------------------------------------- Fall Risk Assessment Details Patient Name: Date of Service: [name], [name] 02/08/2019 10:30 AM Medical Record [id number] Patient Account Number: [id number] Date of Birth/Sex: Treating RN: 03/22/1955 (64 y.o. [name]) Carlene Coria Primary Care Micholas Drumwright: SYSTEM, PCP Other Clinician: Referring Joel Cowin: Treating Zakaria Sedor/Extender:[name], [name]  in Treatment: 0 Fall Risk Assessment Items Have you had 2 or more falls in the last 12 monthso 0 No Have you had any fall that resulted in injury in the last 12 monthso 0 No FALLS RISK SCREEN History of falling - immediate or within 3 months 0 No Secondary diagnosis (Do you have 2 or more medical diagnoseso) 0 No Ambulatory aid None/bed rest/wheelchair/nurse 0 No Crutches/cane/walker 0 No Furniture 0 No Intravenous therapy Access/Saline/Heparin Lock 0 No Weak (short steps with or without shuffle, stooped but able to lift head 0 No while walking, may seek support from furniture) Impaired (short steps with shuffle, may have difficulty arising from chair, 0 No head down, impaired balance) Mental Status Oriented to  own ability 0 No Overestimates or forgets limitations 0 No Risk Level: Low Risk Score: 0 Electronic Signature(s) Signed: 02/08/2019 6:01:30 PM By: Carlene Coria RN Entered By: Carlene Coria on 02/08/2019 11:25:52 -------------------------------------------------------------------------------- Foot Assessment Details Patient Name: Date of Service: [name], [name] 02/08/2019 10:30 AM Medical Record [id number] Patient Account Number: [id number] Date of Birth/Sex: Treating RN: 05/17/1955 (64 y.o. [name]) Carlene Coria Primary Care Tennyson Wacha: SYSTEM, PCP Other Clinician: Referring Tadarrius Burch: Treating Imad Shostak/Extender:[name], [name] in Treatment: 0 Foot Assessment Items Site Locations + = Sensation present, - = Sensation absent, C = Callus, U = Ulcer R = Redness, W = Warmth, M = Maceration, PU = Pre-ulcerative lesion F = Fissure, S = Swelling, D = Dryness Assessment Right: Left: Other Deformity: No No Prior Foot Ulcer: No No Prior Amputation: No No Charcot Joint: No No Ambulatory Status: Ambulatory Without Help Gait: Steady Electronic Signature(s) Signed: 02/08/2019 6:01:30 PM By: Carlene Coria RN Entered By: Carlene Coria on 02/08/2019  11:33:42 -------------------------------------------------------------------------------- Nutrition Risk Screening Details Patient Name: Date of Service: [name], [name] 02/08/2019 10:30 AM Medical Record [id number] Patient Account Number: [id number] Date of Birth/Sex: Treating RN: 31-Mar-1955 (63 y.o. [name]) Carlene Coria Primary Care Neliah Cuyler: SYSTEM, PCP Other Clinician: Referring Raford Brissett: Treating Sanjeev Main/Extender:[name], [name] in Treatment: 0 Height (in): 74 Weight (lbs): 238 Body Mass Index (BMI): 30.6 Nutrition Risk Screening Items Score Screening NUTRITION RISK SCREEN: I have an illness or condition that made me change the kind and/or 0 No amount of food I eat I eat fewer than two meals per day 0 No I eat few fruits and vegetables, or milk products 0 No I have three or more drinks of beer, liquor or wine almost every day 0 No I have tooth or mouth problems that make it hard for me to eat 0 No I don't always have enough money to buy the food I need 0 No I eat alone most of the time 0 No I take three or more different prescribed or over-the-counter drugs a day 1 Yes 0 No Without wanting to, I have lost or gained 10 pounds in the last six months I am not always physically able to shop, cook and/or feed myself 0 No Nutrition Protocols Good Risk Protocol 0 No interventions needed Moderate Risk Protocol High Risk Proctocol Risk Level: Good Risk Score: 1 Electronic Signature(s) Signed: 02/08/2019 6:01:30 PM By: Carlene Coria RN Entered By: Carlene Coria on 02/08/2019 11:26:09

## 2019-02-09 NOTE — Progress Notes (Signed)
[name], [name] ([id number]) Visit Report for 02/08/2019 Chief Complaint Document Details Patient Name: Date of Service: [name], [name] 02/08/2019 10:30 AM Medical Record [id number] Patient Account Number: [id number] Date of Birth/Sex: Treating RN: August 23, 1955 (64 y.o. M) Primary Care Provider: SYSTEM, PCP Other Clinician: Referring Provider: Treating Provider/Extender:Irmgard Rampersaud, [name] in Treatment: 0 Information Obtained from: Patient Chief Complaint 02/08/2019; patient is here for a wound on his plantar left foot Electronic Signature(s) Signed: 02/09/2019 5:32:11 PM By: [name] MD Entered By: [name] on 02/08/2019 13:05:23 -------------------------------------------------------------------------------- Debridement Details Patient Name: Date of Service: [name], [name] 02/08/2019 10:30 AM Medical Record [id number] Patient Account Number: [id number] Date of Birth/Sex: [birth year] (63 y.o. M) Treating RN: Primary Care Provider: SYSTEM, PCP Other Clinician: Referring Provider: Treating Provider/Extender:Filomena Pokorney, [name] in Treatment: 0 Debridement Performed for Wound #1 Left,Plantar Foot Assessment: Performed By: Physician [name], MD Debridement Type: Debridement Severity of Tissue Pre Fat layer exposed Debridement: Level of Consciousness (Pre- Awake and Alert procedure): Pre-procedure Verification/Time Out Taken: Yes - 12:38 Start Time: 12:38 Total Area Debrided (L x W): 0.5 (cm) x 0.4 (cm) = 0.2 (cm) Tissue and other material Viable, Non-Viable, Callus, Subcutaneous, Skin: Dermis debrided: Level: Skin/Subcutaneous Tissue Debridement Description: Excisional Instrument: Curette Bleeding: Minimum Hemostasis Achieved: Pressure End Time: 12:40 Procedural Pain: 0 Post Procedural Pain: 0 Response to Treatment: Procedure was tolerated well Level of Consciousness Awake and Alert (Post-procedure): Post Debridement Measurements  of Total Wound Length: (cm) 0.5 Width: (cm) 0.4 Depth: (cm) 0.4 Volume: (cm) 0.063 Character of Wound/Ulcer Post Improved Debridement: Severity of Tissue Post Debridement: Fat layer exposed Post Procedure Diagnosis Same as Pre-procedure Electronic Signature(s) Signed: 02/09/2019 5:32:11 PM By: [name] MD Entered By: [name] on 02/08/2019 13:01:27 -------------------------------------------------------------------------------- HPI Details Patient Name: Date of Service: [name], [name] 02/08/2019 10:30 AM Medical Record [id number] Patient Account Number: [id number] Date of Birth/Sex: Treating RN: 06-20-1955 (64 y.o. M) Primary Care Provider: SYSTEM, PCP Other Clinician: Referring Provider: Treating Provider/Extender:Jonette Wassel, [name] in Treatment: 0 History of Present Illness HPI Description: ADMISSION 02/08/2019 Patient is a 64 year old man who lives in [place]. He comes to clinic today accompanied by his DFS social worker from [place]. The story is that he got a blister and a wound on his foot from a prolonged walk he did almost a year ago after his car broke down. By review of his own pictures on his phone the wound was substantial. He comes with a nice note from his physician at [facility] wound care center. Apparently when he presented with his wound in May 2020 he had a wound on the midfoot with necrosis and gangrene which required extensive incision and drainage. He was treated with a course of Dermagraft with some improvement. He developed an acute infection in December 2020 requiring hospitalization again and he again underwent incision and debridement. Culture reviewed Staph aureus which was oxacillin resistant i.e. MRSA. An MRI during this hospitalization revealed osteomyelitis involving of the shaft and remanence of the head of the first metatarsal and the proximal phalanx of the great toe. There is also osteomyelitis involving  the entire second and third metatarsals in the proximal phalanx of the second and third toes. He is on a 6-week course of linezolid and rifampin which she comes into clinic currently taking although I am not completely certain how far along he is in the this course of antibiotics it would apparently be at least a month. He is tolerating these well. Patient tells me he did not tolerate a  forefoot offloading boot and he is supposed to be using a surgical sandal however he arrives in clinic today with an old running shoe on. He is using silver alginate on the wounds Past medical history; type 2 diabetes with mild neuropathy, hypertension, gout, lumbar radiculopathy, GERD, nephrolithiasis, back surgery, cholecystectomy, bilateral lower extremity edema, Nash, pyogenic arthritis of the shoulder in 2010 ABI in our clinic was 0.93 on the left. Previous ABIs quoted in the notes from Dr. Tye Maryland also shows normal ABIs right and left Electronic Signature(s) Signed: 02/09/2019 5:32:11 PM By: [name] MD Entered By: [name] on 02/08/2019 13:12:34 -------------------------------------------------------------------------------- Physical Exam Details Patient Name: Date of Service: [name], [name] 02/08/2019 10:30 AM Medical Record [id number] Patient Account Number: [id number] Date of Birth/Sex: Treating RN: [birth year] (64 y.o. M) Primary Care Provider: SYSTEM, PCP Other Clinician: Referring Provider: Treating Provider/Extender:Quinterious Walraven, [name] in Treatment: 0 Constitutional Patient is hypertensive.. Pulse regular and within target range for patient.Marland Kitchen Respirations regular, non-labored and within target range.. Temperature is normal and within the target range for the patient.Marland Kitchen Appears in no distress. Respiratory work of breathing is normal. Bilateral breath sounds are clear and equal in all lobes with no wheezes, rales or rhonchi.. Cardiovascular Heart rhythm and rate regular,  without murmur or gallop.. Pedal pulses palpable on the left. Integumentary (Hair, Skin) No erythema around the wound no purulent. Neurological Some loss of light touch and vibration sense in the plantar aspect of his left foot. Psychiatric appears at normal baseline. Notes Wound exam; the area is on the plantar left foot just proximal to the second met head small wound with circumferential undermining. Using a #5 curette the undermining was removed. Almost surprisingly the wound looks fairly good healthy looking granulation. There was no purulent drainage no erythema certainly nothing that approached the deep tissue planes of his foot. Electronic Signature(s) Signed: 02/09/2019 5:32:11 PM By: [name] MD Entered By: [name] on 02/08/2019 13:17:18 -------------------------------------------------------------------------------- Physician Orders Details Patient Name: Date of Service: [name], [name] 02/08/2019 10:30 AM Medical Record [id number] Patient Account Number: [id number] Date of Birth/Sex: Treating RN: [birth year] (64 y.o. [name] Primary Care Provider: SYSTEM, PCP Other Clinician: Referring Provider: Treating Provider/Extender:Jini Horiuchi, [name] in Treatment: 0 Verbal / Phone Orders: No Diagnosis Coding Follow-up Appointments Return Appointment in 1 week. Dressing Change Frequency Wound #1 Left,Plantar Foot Change Dressing every other day. Wound Cleansing Wound #1 Left,Plantar Foot Clean wound with Wound Cleanser - or normal saline Primary Wound Dressing Wound #1 Left,Plantar Foot Calcium Alginate with Silver Secondary Dressing Foam - foam donut Kerlix/Rolled Gauze - secure with tape Dry Gauze Other: - felt inside surgical shoe Off-Loading Open toe surgical shoe to: - left foot [facility] for wound care. - Advanced Hyperbaric Oxygen Therapy Evaluate for HBO Therapy Indication: - Wagner 3  Diabetic Foot Ulcer - Left foot If appropriate for treatment, begin HBOT per protocol: 2.5 ATA for 90 Minutes with 2 Five (5) Minute Air Breaks Total Number of Treatments: - 40 One treatments per day (delivered Monday through Friday unless otherwise specified in Special Instructions below): Antihistamine 30 minutes prior to HBO Treatment, difficulty clearing ears. Finger stick Blood Glucose Pre- and Post- HBOT Treatment. Follow Hyperbaric Oxygen Glycemia Protocol GLYCEMIA INTERVENTIONS PROTOCOL PRE-HBO GLYCEMIA INTERVENTIONS ACTION INTERVENTION Obtain pre-HBO capillary blood 1 glucose (ensure physician order is in chart). A. Notify HBO physician and await physician orders. 2 If result is 70 mg/dl or below: B. If the result meets the hospital definition  of a critical result, follow hospital policy. A. Give patient an 8 ounce Glucerna Shake, an 8 ounce Ensure, or 8 ounces of a Glucerna/Ensure equivalent dietary supplement*. B. Wait 30 minutes. C. Retest patients capillary blood If result is 71 mg/dl to 130 mg/dl: glucose (CBG). D. If result greater than or equal to 110 mg/dl, proceed with HBO. If result less than 110 mg/dl, notify HBO physician and consider holding HBO. If result is 131 mg/dl to 249 mg/dl: A. Proceed with HBO. A. Notify HBO physician and await physician orders. B. It is recommended to hold HBO If result is 250 mg/dl or greater: and do blood/urine ketone testing. C. If the result meets the hospital definition of a critical result, follow hospital policy. POST-HBO GLYCEMIA INTERVENTIONS ACTION INTERVENTION Obtain post HBO capillary blood 1 glucose (ensure physician order is in chart). A. Notify HBO physician and await physician orders. 2 If result is 70 mg/dl or below: B. If the result meets the hospital definition of a critical result, follow hospital policy. A. Give patient an 8 ounce Glucerna Shake, an 8 ounce Ensure, or 8 ounces of a  Glucerna/Ensure equivalent dietary supplement*. B. Wait 15 minutes for symptoms of hypoglycemia (i.e. nervousness, anxiety, sweating, chills, If result is 71 mg/dl to 100 mg/dl: clamminess, irritability, confusion, tachycardia or dizziness). C. If patient asymptomatic, discharge patient. If patient symptomatic, repeat capillary blood glucose (CBG) and notify HBO physician. If result is 101 mg/dl to 249 mg/dl: A. Discharge patient. A. Notify HBO physician and await physician orders. B. It is recommended to do If result is 250 mg/dl or greater: blood/urine ketone testing. C. If the result meets the hospital definition of a critical result, follow hospital policy. *Juice or candies are NOT equivalent products. If patient refuses the Glucerna or Ensure, please consult the hospital dietitian for an appropriate substitute. Electronic Signature(s) Signed: 02/08/2019 6:46:58 PM By: Levan Hurst RN, BSN Signed: 02/09/2019 5:32:11 PM By: [name] MD Entered By: Levan Hurst on 02/08/2019 12:49:42 -------------------------------------------------------------------------------- Problem List Details Patient Name: Date of Service: ZIA, KANNER 02/08/2019 10:30 AM Medical Record [id number] Patient Account Number: [id number] Date of Birth/Sex: Treating RN: 03-30-1955 (64 y.o. 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 M86.672 Other chronic osteomyelitis, left ankle and foot 02/08/2019 No Yes E11.621 Type 2 diabetes mellitus with foot ulcer 02/08/2019 No Yes Inactive Problems Resolved Problems Electronic  Signature(s) Signed: 02/09/2019 5:32:11 PM By: [name] MD Entered By: [name] on 02/08/2019 13:00:24 -------------------------------------------------------------------------------- Progress Note Details Patient Name: Date of Service: [name], [name] 02/08/2019 10:30 AM Medical Record [id number] Patient Account Number: [id number] Date of Birth/Sex: Treating RN: August 29, 1955 (64 y.o. M) Primary Care Provider: SYSTEM, PCP Other Clinician: Referring Provider: Treating Provider/Extender:Bianna Haran, [name] in Treatment: 0 Subjective Chief Complaint Information obtained from Patient 02/08/2019; patient is here for a wound on his plantar left foot History of Present Illness (HPI) ADMISSION 02/08/2019 Patient is a 64 year old man who lives in [place]. He comes to clinic today accompanied by his DFS social worker from [place]. The story is that he got a blister and a wound on his foot from a prolonged walk he did almost a year ago after his car broke down. By review of  his own pictures on his phone the wound was substantial. He comes with a nice note from his physician at [facility] wound care center. Apparently when he presented with his wound in May 2020 he had a wound on the midfoot with necrosis and gangrene which required extensive incision and drainage. He was treated with a course of Dermagraft with some improvement. He developed an acute infection in December 2020 requiring hospitalization again and he again underwent incision and debridement. Culture reviewed Staph aureus which was oxacillin resistant i.e. MRSA. An MRI during this hospitalization revealed osteomyelitis involving of the shaft and remanence of the head of the first metatarsal and the proximal phalanx of the great toe. There is also osteomyelitis involving the entire second and third metatarsals in the proximal phalanx of the second and third toes. He is on a 6-week course of linezolid  and rifampin which she comes into clinic currently taking although I am not completely certain how far along he is in the this course of antibiotics it would apparently be at least a month. He is tolerating these well. Patient tells me he did not tolerate a forefoot offloading boot and he is supposed to be using a surgical sandal however he arrives in clinic today with an old running shoe on. He is using silver alginate on the wounds Past medical history; type 2 diabetes with mild neuropathy, hypertension, gout, lumbar radiculopathy, GERD, nephrolithiasis, back surgery, cholecystectomy, bilateral lower extremity edema, Nash, pyogenic arthritis of the shoulder in 2010 ABI in our clinic was 0.93 on the left. Previous ABIs quoted in the notes from Dr. Tye Maryland also shows normal ABIs right and left Patient History Information obtained from Patient. Allergies anti snake venum Family History Cancer - Mother,Father, Diabetes - Father,Siblings, Heart Disease - Mother, Hypertension - Mother,Father, No family history of Hereditary Spherocytosis, Kidney Disease, Lung Disease, Seizures, Stroke, Thyroid Problems, Tuberculosis. Social History Never smoker, Marital Status - Divorced, Alcohol Use - Never, Drug Use - No History, Caffeine Use - Never. Medical History Eyes Denies history of Cataracts, Glaucoma, Optic Neuritis Ear/Nose/Mouth/Throat Denies history of Chronic sinus problems/congestion, Middle ear problems Hematologic/Lymphatic Denies history of Anemia, Hemophilia, Human Immunodeficiency Virus, Lymphedema, Sickle Cell Disease Respiratory Denies history of Aspiration, Asthma, Chronic Obstructive Pulmonary Disease (COPD), Pneumothorax, Sleep Apnea, Tuberculosis Cardiovascular Patient has history of Hypertension Denies history of Angina, Arrhythmia, Congestive Heart Failure, Coronary Artery Disease, Deep Vein Thrombosis, Hypotension, Myocardial Infarction, Peripheral Arterial Disease, Peripheral  Venous Disease, Phlebitis, Vasculitis Gastrointestinal Denies history of Cirrhosis , Colitis, Crohnoos, Hepatitis A, Hepatitis B, Hepatitis C Endocrine Patient has history of Type II Diabetes Denies history of Type I Diabetes Genitourinary Denies history of End Stage Renal Disease Immunological Denies history of Lupus Erythematosus, Raynaudoos, Scleroderma Integumentary (Skin) Denies history of History of Burn Musculoskeletal Denies history of Gout, Rheumatoid Arthritis, Osteoarthritis, Osteomyelitis Neurologic Denies history of Dementia, Neuropathy, Quadriplegia, Paraplegia, Seizure Disorder Oncologic Denies history of Received Chemotherapy, Received Radiation Psychiatric Denies history of Anorexia/bulimia, Confinement Anxiety Patient is treated with Insulin. Blood sugar is not tested. Review of Systems (ROS) Constitutional Symptoms (General Health) Denies complaints or symptoms of Fatigue, Fever, Chills, Marked Weight Change. Eyes Denies complaints or symptoms of Dry Eyes, Vision Changes, Glasses / Contacts. Ear/Nose/Mouth/Throat Denies complaints or symptoms of Chronic sinus problems or rhinitis. Respiratory Denies complaints or symptoms of Chronic or frequent coughs, Shortness of Breath. Cardiovascular Denies complaints or symptoms of Chest pain. Gastrointestinal Denies complaints or symptoms of Frequent diarrhea, Nausea, Vomiting. Endocrine Denies complaints or symptoms of  Heat/cold intolerance. Genitourinary Denies complaints or symptoms of Frequent urination. Integumentary (Skin) Complains or has symptoms of Wounds. Musculoskeletal Denies complaints or symptoms of Muscle Pain, Muscle Weakness. Neurologic Denies complaints or symptoms of Numbness/parasthesias. Psychiatric Denies complaints or symptoms of Claustrophobia, Suicidal. Objective Constitutional Patient is hypertensive.. Pulse regular and within target range for patient.Marland Kitchen Respirations regular,  non-labored and within target range.. Temperature is normal and within the target range for the patient.Marland Kitchen Appears in no distress. Vitals Time Taken: 11:15 AM, Height: 74 in, Source: Stated, Weight: 238 lbs, Source: Stated, BMI: 30.6, Temperature: 98.9 F, Pulse: 98 bpm, Respiratory Rate: 18 breaths/[name], Blood Pressure: 162/88 mmHg, Capillary Blood Glucose: 79 mg/dl. General Notes: CBG per patient Respiratory work of breathing is normal. Bilateral breath sounds are clear and equal in all lobes with no wheezes, rales or rhonchi.. Cardiovascular Heart rhythm and rate regular, without murmur or gallop.. Pedal pulses palpable on the left. Neurological Some loss of light touch and vibration sense in the plantar aspect of his left foot. Psychiatric appears at normal baseline. General Notes: Wound exam; the area is on the plantar left foot just proximal to the second met head small wound with circumferential undermining. Using a #5 curette the undermining was removed. Almost surprisingly the wound looks fairly good healthy looking granulation. There was no purulent drainage no erythema certainly nothing that approached the deep tissue planes of his foot. Integumentary (Hair, Skin) No erythema around the wound no purulent. Wound #1 status is Open. Original cause of wound was Blister. The wound is located on the [place]. The wound measures 0.5cm length x 0.4cm width x 0.4cm depth; 0.157cm^2 area and 0.063cm^3 volume. There is Fat Layer (Subcutaneous Tissue) Exposed exposed. There is no tunneling noted, however, there is undermining starting at 12:00 and ending at 12:00 with a maximum distance of 3.5cm. There is a small amount of serosanguineous drainage noted. The wound margin is flat and intact. There is large (67-100%) pink granulation within the wound bed. There is no necrotic tissue within the wound bed. Assessment Active Problems ICD-10 Type 2 diabetes mellitus with foot  ulcer Non-pressure chronic ulcer of other part of left foot with fat layer exposed Other chronic osteomyelitis, left ankle and foot Type 2 diabetes mellitus with foot ulcer Procedures Wound #1 Pre-procedure diagnosis of Wound #1 is a Diabetic Wound/Ulcer of the Lower Extremity located on the Left,Plantar Foot .Severity of Tissue Pre Debridement is: Fat layer exposed. There was a Excisional Skin/Subcutaneous Tissue Debridement with a total area of 0.2 sq cm performed by [name], MD. With the following instrument(s): Curette to remove Viable and Non-Viable tissue/material. Material removed includes Callus, Subcutaneous Tissue, and Skin: Dermis. No specimens were taken. A time out was conducted at 12:38, prior to the start of the procedure. A Minimum amount of bleeding was controlled with Pressure. The procedure was tolerated well with a pain level of 0 throughout and a pain level of 0 following the procedure. Post Debridement Measurements: 0.5cm length x 0.4cm width x 0.4cm depth; 0.063cm^3 volume. Character of Wound/Ulcer Post Debridement is improved. Severity of Tissue Post Debridement is: Fat layer exposed. Post procedure Diagnosis Wound #1: Same as Pre-Procedure Plan Follow-up Appointments: Return Appointment in 1 week. Dressing Change Frequency: Wound #1 Left,Plantar Foot: Change Dressing every other day. Wound Cleansing: Wound #1 Left,Plantar Foot: Clean wound with Wound Cleanser - or normal saline Primary Wound Dressing: Wound #1 Left,Plantar Foot: Calcium Alginate with Silver Secondary Dressing: Foam - foam donut Kerlix/Rolled Gauze -  secure with tape Dry Gauze Other: - felt inside surgical shoe Off-Loading: Open toe surgical shoe to: - left foot Home Health: [facility] for wound care. - Advanced Hyperbaric Oxygen Therapy: Evaluate for HBO Therapy Indication: - Wagner 3 Diabetic Foot Ulcer - Left foot If appropriate for treatment,  begin HBOT per protocol: 2.5 ATA for 90 Minutes with 2 Five (5) Minute Air Breaks Total Number of Treatments: - 40 One treatments per day (delivered Monday through Friday unless otherwise specified in Special Instructions below): Antihistamine 30 minutes prior to HBO Treatment, difficulty clearing ears. Finger stick Blood Glucose Pre- and Post- HBOT Treatment. Follow Hyperbaric Oxygen Glycemia Protocol 1. Diabetic foot ulcer on the left plantar foot which is a Wagner stage III wound based on the clinical history and the MRI that was done in [place] on 01/06/2019. 2. The patient cultured MRSA during an operative culture also in late December. He is currently on linezolid and rifampin. I believe linezolid has good bone penetration I am not certain about rifampin in any case I think he probably is approaching 4 weeks of treatment which he appears to be tolerating. 3. The patient is a candidate for hyperbaric oxygen therapy. He apparently has Medicaid will need to present this of course to Medicaid for approval. I was also somewhat concerned about the ability to transport him from [place] to [place] on a 5-day a week basis although neither the patient or the social worker seem to think that this was an insurmountable obstacle 4. The patient arrived in clinic today in our old running shoe. I told him that with an adequate offloading any attempt healed his wound and cure the osteomyelitis would be a waste of time. He expressed understanding we gave him a surgical shoe today. He also told me that he did not handle a forefoot offloading boot in the past. I am not sure what the issue was. 5. Again I presented to him the option of a transmetatarsal amputation. He told me that Dr. Tye Maryland had presented this to him on several occasions and once again he is refusing. 6. I did not change his primary dressing which is silver alginate. I counseled him on continued offloading which I think is part of  the major issue here. I did not change his antibiotics which are linezolid and rifampin but I will see if I can look into bone penetration of rifampin. I think linezolid has good bone penetration however. 7. Patient has had considerable improvement in this wound based on the pictures on his phone I spent 45 minutes in the review of this patient's records, face-to-face review with the patient in preparation of this medical record Electronic Signature(s) Signed: 02/09/2019 5:32:11 PM By: [name] MD Entered By: [name] on 02/08/2019 13:22:56 -------------------------------------------------------------------------------- HxROS Details Patient Name: Date of Service: [name], [name] 02/08/2019 10:30 AM Medical Record [id number] Patient Account Number: [id number] Date of Birth/Sex: Treating RN: 12/29/1955 (63 y.o. [name]) Carlene Coria Primary Care Provider: SYSTEM, PCP Other Clinician: Referring Provider: Treating Provider/Extender:Alyssamarie Mounsey, [name] in Treatment: 0 Information Obtained From Patient Constitutional Symptoms (General Health) Complaints and Symptoms: Negative for: Fatigue; Fever; Chills; Marked Weight Change Eyes Complaints and Symptoms: Negative for: Dry Eyes; Vision Changes; Glasses / Contacts Medical History: Negative for: Cataracts; Glaucoma; Optic Neuritis Ear/Nose/Mouth/Throat Complaints and Symptoms: Negative for: Chronic sinus problems or rhinitis Medical History: Negative for: Chronic sinus problems/congestion; Middle ear problems Respiratory Complaints and Symptoms: Negative for: Chronic or frequent coughs; Shortness of Breath  Medical History: Negative for: Aspiration; Asthma; Chronic Obstructive Pulmonary Disease (COPD); Pneumothorax; Sleep Apnea; Tuberculosis Cardiovascular Complaints and Symptoms: Negative for: Chest pain Medical History: Positive for: Hypertension Negative for: Angina; Arrhythmia; Congestive Heart Failure; Coronary  Artery Disease; Deep Vein Thrombosis; Hypotension; Myocardial Infarction; Peripheral Arterial Disease; Peripheral Venous Disease; Phlebitis; Vasculitis Gastrointestinal Complaints and Symptoms: Negative for: Frequent diarrhea; Nausea; Vomiting Medical History: Negative for: Cirrhosis ; Colitis; Crohns; Hepatitis A; Hepatitis B; Hepatitis C Endocrine Complaints and Symptoms: Negative for: Heat/cold intolerance Medical History: Positive for: Type II Diabetes Negative for: Type I Diabetes Time with diabetes: 95 Treated with: Insulin Blood sugar tested every day: No Genitourinary Complaints and Symptoms: Negative for: Frequent urination Medical History: Negative for: End Stage Renal Disease Integumentary (Skin) Complaints and Symptoms: Positive for: Wounds Medical History: Negative for: History of Burn Musculoskeletal Complaints and Symptoms: Negative for: Muscle Pain; Muscle Weakness Medical History: Negative for: Gout; Rheumatoid Arthritis; Osteoarthritis; Osteomyelitis Neurologic Complaints and Symptoms: Negative for: Numbness/parasthesias Medical History: Negative for: Dementia; Neuropathy; Quadriplegia; Paraplegia; Seizure Disorder Psychiatric Complaints and Symptoms: Negative for: Claustrophobia; Suicidal Medical History: Negative for: Anorexia/bulimia; Confinement Anxiety Hematologic/Lymphatic Medical History: Negative for: Anemia; Hemophilia; Human Immunodeficiency Virus; Lymphedema; Sickle Cell Disease Immunological Medical History: Negative for: Lupus Erythematosus; Raynauds; Scleroderma Oncologic Medical History: Negative for: Received Chemotherapy; Received Radiation Immunizations Pneumococcal Vaccine: Received Pneumococcal Vaccination: No Tetanus Vaccine: Last tetanus shot: 01/15/2016 Implantable Devices None Family and Social History Cancer: Yes - Mother,Father; Diabetes: Yes - Father,Siblings; Heart Disease: Yes - Mother; Hereditary Spherocytosis:  No; Hypertension: Yes - Mother,Father; Kidney Disease: No; Lung Disease: No; Seizures: No; Stroke: No; Thyroid Problems: No; Tuberculosis: No; Never smoker; Marital Status - Divorced; Alcohol Use: Never; Drug Use: No History; Caffeine Use: Never; Financial Concerns: No; Food, Clothing or Shelter Needs: No; Support System Lacking: No; Transportation Concerns: No Electronic Signature(s) Signed: 02/08/2019 6:01:30 PM By: Carlene Coria RN Signed: 02/09/2019 5:32:11 PM By: [name] MD Entered By: Carlene Coria on 02/08/2019 11:50:14 -------------------------------------------------------------------------------- SuperBill Details Patient Name: Date of Service: [name], [name] 02/08/2019 Medical Record [id number] Patient Account Number: [id number] Date of Birth/Sex: Treating RN: 1955/12/28 (64 y.o. 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 Other chronic osteomyelitis, left ankle and foot E11.621 Type 2 diabetes mellitus with foot ulcer Facility Procedures CPT4 Code Description: 91478295 99213 - WOUND CARE VISIT-LEV 3 EST PT Modifier: 25 Quantity: 1 CPT4 Code Description: 62130865 11042 - DEB SUBQ TISSUE 20 SQ CM/< ICD-10 Diagnosis Description L97.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 of other part of left foot with f Modifier: at layer expos Quantity: 1 ed Physician Procedures CPT4 Code Description: 7846962 95284 - WC PHYS LEVEL 4 - NEW PT ICD-10 Diagnosis Description E11.621 Type 2 diabetes mellitus with foot ulcer L97.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 of other part of left foot wi X32.440 Other chronic osteomyelitis, left ankle  and foot Modifier: 25 th fat layer ex Quantity: 1 posed CPT4 Code  Description: 1027253 66440 - WC PHYS SUBQ TISS 20 SQ CM ICD-10 Diagnosis Description L97.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 of other part of left foot with Modifier: fat layer expos Quantity: 1 ed Electronic Signature(s) Signed: 02/08/2019 6:46:58 PM By: Levan Hurst RN, BSN Signed: 02/09/2019 5:32:11 PM By: [name] MD Entered By: Levan Hurst on 02/08/2019 18:04:14

## 2019-02-11 NOTE — Progress Notes (Signed)
[name], [name] ([id number]) Visit Report for 02/08/2019 Allergy List Details Patient Name: Date of Service: [name], [name] 02/08/2019 10:30 AM Medical Record [id number] Patient Account Number: [id number] Date of Birth/Sex: 01-Sep-1955 (64 y.o. Male) Treating RN: Carlene Coria Primary Care Keavon Sensing: SYSTEM, PCP Other Clinician: Referring Carie Kapuscinski: Treating Ayman Brull/Extender:[name], [name] in Treatment: 0 Allergies Active Allergies anti snake venum Allergy Notes Electronic Signature(s) Signed: 02/08/2019 6:01:30 PM By: Carlene Coria RN Entered By: Carlene Coria on 02/08/2019 11:57:23 -------------------------------------------------------------------------------- [place] Details Patient Name: Date of Service: [name], [name] 02/08/2019 10:30 AM Medical Record [id number] Patient Account Number: [id number] Date of Birth/Sex: 04-Sep-1955 (64 y.o. Male) Treating RN: Carlene Coria Primary Care Allayna Erlich: SYSTEM, PCP Other Clinician: Referring Rickell Wiehe: Treating Aleczander Fandino/Extender:[name], [name] in Treatment: 0 Visit Information Patient Arrived: Ambulatory Arrival Time: 11:02 Accompanied By: Programmer, multimedia Assistance: None Patient Identification Verified: Yes Secondary Verification Process Completed: Yes Patient Requires Transmission-Based No Precautions: Patient Has Alerts: No Electronic Signature(s) Signed: 02/08/2019 6:01:30 PM By: Carlene Coria RN Entered By: Carlene Coria on 02/08/2019 11:15:52 -------------------------------------------------------------------------------- Clinic Level of Care Assessment Details Patient Name: Date of Service: [name], [name] 02/08/2019 10:30 AM Medical Record [id number] Patient Account Number: [id number] Date of Birth/Sex: 1955-05-09 (64 y.o. Male) Treating RN: Levan Hurst Primary Care Ahnyla Mendel: SYSTEM, PCP Other Clinician: Referring Nahia Nissan: Treating Ysenia Filice/Extender:[name], [name]  in Treatment: 0 Clinic Level of Care Assessment Items TOOL 1 Quantity Score X - Use when EandM and Procedure is performed on INITIAL visit 1 0 ASSESSMENTS - Nursing Assessment / Reassessment X - General Physical Exam (combine w/ comprehensive assessment (listed just below) 1 20 when performed on new pt. evals) X - Comprehensive Assessment (HX, ROS, Risk Assessments, Wounds Hx, etc.) 1 25 ASSESSMENTS - Wound and Skin Assessment / Reassessment []  - Dermatologic / Skin Assessment (not related to wound area) 0 ASSESSMENTS - Ostomy and/or Continence Assessment and Care []  - Incontinence Assessment and Management 0 []  - Ostomy Care Assessment and Management (repouching, etc.) 0 PROCESS - Coordination of Care X - Simple Patient / Family Education for ongoing care 1 15 []  - Complex (extensive) Patient / Family Education for ongoing care 0 X - Staff obtains Programmer, systems, Records, Test Results / Process Orders 1 10 []  - Staff telephones HHA, Nursing Homes / Clarify orders / etc 0 []  - Routine Transfer to another Facility (non-emergent condition) 0 []  - Routine Hospital Admission (non-emergent condition) 0 X - New Admissions / Biomedical engineer / Ordering NPWT, Apligraf, etc. 1 15 []  - Emergency Hospital Admission (emergent condition) 0 PROCESS - Special Needs []  - Pediatric / Minor Patient Management 0 []  - Isolation Patient Management 0 []  - Hearing / Language / Visual special needs 0 []  - Assessment of Community assistance (transportation, D/C planning, etc.) 0 []  - Additional assistance / Altered mentation 0 []  - Support Surface(s) Assessment (bed, cushion, seat, etc.) 0 INTERVENTIONS - Miscellaneous []  - External ear exam 0 []  - Patient Transfer (multiple staff / Civil Service fast streamer / Similar devices) 0 []  - Simple Staple / Suture removal (25 or less) 0 []  - Complex Staple / Suture removal (26 or more) 0 []  - Hypo/Hyperglycemic Management (do not check if billed separately) 0 X - Ankle /  Brachial Index (ABI) - do not check if billed separately 1 15 Has the patient been seen at the hospital within the last three years: Yes Total Score: 100 Level Of Care: New/Established - Level 3 Electronic Signature(s) Signed: 02/08/2019 6:46:58 PM By: Levan Hurst RN, BSN Entered By: Levan Hurst  on 02/08/2019 12:50:43 -------------------------------------------------------------------------------- Encounter Discharge Information Details Patient Name: Date of Service: [name], [name] 02/08/2019 10:30 AM Medical Record [id number] Patient Account Number: [id number] Date of Birth/Sex: 1955/04/13 (64 y.o. Male) Treating RN: Kela Millin Primary Care Cherine Drumgoole: SYSTEM, PCP Other Clinician: Referring Yulanda Diggs: Treating Keyry Iracheta/Extender:[name], [name] in Treatment: 0 Encounter Discharge Information Items Post Procedure Vitals Discharge Condition: Stable Temperature (F): 98.9 Ambulatory Status: Ambulatory Pulse (bpm): 98 Discharge Destination: Home Respiratory Rate (breaths/min): 18 Transportation: Private Auto Blood Pressure (mmHg): 162/88 Accompanied By: Education officer, museum Schedule Follow-up Appointment: Yes Clinical Summary of Care: Patient Declined Electronic Signature(s) Signed: 02/11/2019 7:44:20 AM By: Kela Millin Entered By: Kela Millin on 02/08/2019 13:09:54 -------------------------------------------------------------------------------- Lower Extremity Assessment Details Patient Name: Date of Service: [name], [name] 02/08/2019 10:30 AM Medical Record [id number] Patient Account Number: [id number] Date of Birth/Sex: Oct 08, 1955 (64 y.o. Male) Treating RN: Carlene Coria Primary Care Caryle Helgeson: SYSTEM, PCP Other Clinician: Referring Tamantha Saline: Treating Amere Iott/Extender:[name], [name] in Treatment: 0 Edema Assessment Assessed: [Left: Yes] [Right: No] Edema: [Left: Ye] [Right: s] Calf Left: Right: Point of Measurement: 42 cm From Medial  Instep 47 cm cm Ankle Left: Right: Point of Measurement: 13 cm From Medial Instep 25 cm cm Vascular Assessment Blood Pressure: Brachial: [Left:162] Ankle: [Left:Dorsalis Pedis: 150 0.93] Electronic Signature(s) Signed: 02/08/2019 6:01:30 PM By: Carlene Coria RN Entered By: Carlene Coria on 02/08/2019 11:50:28 -------------------------------------------------------------------------------- Multi Wound Chart Details Patient Name: Date of Service: [name], [name] 02/08/2019 10:30 AM Medical Record [id number] Patient Account Number: [id number] Date of Birth/Sex: 11/25/1955 (64 y.o. Male) Treating RN: Primary Care Lynann Demetrius: SYSTEM, PCP Other Clinician: Referring Valin Massie: Treating Justis Dupas/Extender:[name], [name] in Treatment: 0 Vital Signs Height(in): 74 Capillary Blood 79 Glucose(mg/dl): Weight(lbs): 238 Pulse(bpm): 98 Body Mass Index(BMI): 31 Blood Pressure(mmHg): 162/88 Temperature(F): 98.9 Respiratory 18 Rate(breaths/min): Photos: [1:No Photos] [N/A:N/A] Wound Location: [1:Left Foot - Plantar] [N/A:N/A] Wounding Event: [1:Blister] [N/A:N/A] Primary Etiology: [1:Diabetic Wound/Ulcer of the N/A Lower Extremity] Comorbid History: [1:Hypertension, Type II Diabetes] [N/A:N/A] Date Acquired: [1:01/14/2017] [N/A:N/A] Weeks of Treatment: [1:0] [N/A:N/A] Wound Status: [1:Open] [N/A:N/A] Measurements L x W x D 0.5x0.4x0.4 [N/A:N/A] (cm) Area (cm) : [1:0.157] [N/A:N/A] Volume (cm) : [1:0.063] [N/A:N/A] % Reduction in Area: [1:0.00%] [N/A:N/A] % Reduction in Volume: 0.00% [N/A:N/A] Starting Position 1 [1:12] (o'clock): Ending Position 1 [1:12] (o'clock): Maximum Distance 1 [1:3.5] (cm): Undermining: [1:Yes] [N/A:N/A] Classification: [1:Grade 3] [N/A:N/A] Wagner Verification: [1:MRI] [N/A:N/A] Exudate Amount: [1:Small] [N/A:N/A] Exudate Type: [1:Serosanguineous] [N/A:N/A] Exudate Color: [1:red, brown] [N/A:N/A] Wound Margin: [1:Flat and Intact]  [N/A:N/A] Granulation Amount: [1:Large (67-100%)] [N/A:N/A] Granulation Quality: [1:Pink] [N/A:N/A] Necrotic Amount: [1:None Present (0%)] [N/A:N/A] Exposed Structures: [1:Fat Layer (Subcutaneous N/A Tissue) Exposed: Yes Fascia: No Tendon: No Muscle: No Joint: No Bone: No] Epithelialization: [1:Medium (34-66%)] [N/A:N/A] Debridement: [1:Debridement - Excisional N/A] Pre-procedure [1:12:38] [N/A:N/A] Verification/Time Out Taken: Tissue Debrided: [1:Callus, Subcutaneous] [N/A:N/A] Level: [1:Skin/Subcutaneous Tissue N/A] Debridement Area (sq cm):0.2 [N/A:N/A] Instrument: [1:Curette] [N/A:N/A] Bleeding: [1:Minimum] [N/A:N/A] Hemostasis Achieved: [1:Pressure] [N/A:N/A] Procedural Pain: [1:0] [N/A:N/A] Post Procedural Pain: [1:0] [N/A:N/A] Debridement Treatment Procedure was tolerated [N/A:N/A] Response: [1:well] Post Debridement [1:0.5x0.4x0.4] [N/A:N/A] Measurements L x W x D (cm) Post Debridement [1:0.063] [N/A:N/A] Volume: (cm) Procedures Performed: [1:Debridement] [N/A:N/A] Treatment Notes Electronic Signature(s) Signed: 02/09/2019 5:32:11 PM By: [name] MD Entered By: [name] on 02/08/2019 13:01:14 -------------------------------------------------------------------------------- Multi-Disciplinary Care Plan Details Patient Name: Date of Service: [name], [name] 02/08/2019 10:30 AM Medical Record [id number] Patient Account Number: [id number] Date of Birth/Sex: 1956/01/12 (64 y.o. Male) Treating RN: Levan Hurst Primary Care Lashanda Storlie: SYSTEM, PCP Other Clinician: Referring Cassundra Mckeever: Treating Linzee Depaul/Extender:[name], Legrand Como  Weeks in Treatment: 0 Active Inactive Nutrition Nursing Diagnoses: Impaired glucose control: actual or potential Potential for alteratiion in Nutrition/Potential for imbalanced nutrition Goals: Patient/caregiver agrees to and verbalizes understanding of need to use nutritional supplements and/or vitamins as prescribed Date  Initiated: 02/08/2019 Target Resolution Date: 03/12/2019 Goal Status: Active Patient/caregiver will maintain therapeutic glucose control Date Initiated: 02/08/2019 Target Resolution Date: 03/12/2019 Goal Status: Active Interventions: Assess HgA1c results as ordered upon admission and as needed Assess patient nutrition upon admission and as needed per policy Provide education on elevated blood sugars and impact on wound healing Provide education on nutrition Notes: Wound/Skin Impairment Nursing Diagnoses: Impaired tissue integrity Knowledge deficit related to ulceration/compromised skin integrity Goals: Patient/caregiver will verbalize understanding of skin care regimen Date Initiated: 02/08/2019 Target Resolution Date: 03/12/2019 Goal Status: Active Ulcer/skin breakdown will have a volume reduction of 30% by week 4 Date Initiated: 02/08/2019 Target Resolution Date: 03/12/2019 Goal Status: Active Interventions: Assess patient/caregiver ability to obtain necessary supplies Assess patient/caregiver ability to perform ulcer/skin care regimen upon admission and as needed Assess ulceration(s) every visit Provide education on ulcer and skin care Notes: Electronic Signature(s) Signed: 02/08/2019 6:46:58 PM By: Levan Hurst RN, BSN Entered By: Levan Hurst on 02/08/2019 12:37:13 -------------------------------------------------------------------------------- Pain Assessment Details Patient Name: Date of Service: [name], [name] 02/08/2019 10:30 AM Medical Record [id number] Patient Account Number: [id number] Date of Birth/Sex: 20-Jun-1955 (64 y.o. Male) Treating RN: Carlene Coria Primary Care Aletta Edmunds: SYSTEM, PCP Other Clinician: Referring Jourdin Connors: Treating Olly Shiner/Extender:[name], [name] in Treatment: 0 Active Problems Location of Pain Severity and Description of Pain Patient Has Paino Yes Site Locations With Dressing Change: Yes Duration of the Pain. Constant /  Intermittento Constant Rate the pain. Current Pain Level: 5 Worst Pain Level: 8 Least Pain Level: 5 Tolerable Pain Level: 5 Character of Pain Describe the Pain: Burning, Throbbing Pain Management and Medication Current Pain Management: Medication: Yes Cold Application: No Rest: Yes Massage: No Activity: No T.E.N.S.: No Heat Application: No Leg drop or elevation: No Is the Current Pain Management Adequate: Inadequate How does your wound impact your activities of daily livingo Sleep: Yes Appetite: Yes Electronic Signature(s) Signed: 02/08/2019 6:01:30 PM By: Carlene Coria RN Entered By: Carlene Coria on 02/08/2019 11:53:07 -------------------------------------------------------------------------------- Patient/Caregiver Education Details Patient Name: [name] 1/25/2021andnbsp10:30 Date of Service: AM Medical Record [id number] Number: Patient Account Number: [id number] Treating RN: Date of Birth/Gender: 1955/09/30 (63 y.o. Levan Hurst Male) Other Clinician: Primary Care Treating SYSTEM, PCP [name] Physician: Physician/Extender: Referring Physician: [name] in Treatment: 0 Education Assessment Education Provided To: Patient Education Topics Provided Elevated Blood Sugar/ Impact on Healing: Handouts: Elevated Blood Sugars: How Do They Affect Wound Healing Methods: Explain/Verbal, Printed Responses: State content correctly Infection: Methods: Explain/Verbal Responses: State content correctly Nutrition: Methods: Explain/Verbal Responses: State content correctly Wound/Skin Impairment: Handouts: Caring for Your Ulcer, Skin Care Do's and Dont's Methods: Explain/Verbal, Printed Responses: State content correctly Electronic Signature(s) Signed: 02/08/2019 6:46:58 PM By: Levan Hurst RN, BSN Entered By: Levan Hurst on 02/08/2019 12:37:59 -------------------------------------------------------------------------------- Wound Assessment  Details Patient Name: Date of Service: [name], [name] 02/08/2019 10:30 AM Medical Record [id number] Patient Account Number: [id number] Date of Birth/Sex: August 18, 1955 (64 y.o. Male) Treating RN: Carlene Coria Primary Care Standley Bargo: SYSTEM, PCP Other Clinician: Referring Sapphira Harjo: Treating Totiana Everson/Extender:[name], [name] in Treatment: 0 Wound Status Wound Number: 1 Primary Diabetic Wound/Ulcer of the Lower Etiology: Extremity Wound Location: Left Foot - Plantar Wound Status: Open Wounding Event: Blister Comorbid Hypertension, Type II Diabetes Date Acquired: 01/14/2017 History: Weeks Of Treatment: 0 Clustered  Wound: No Wound Measurements Length: (cm) 0.5 Width: (cm) 0.4 Depth: (cm) 0.4 Area: (cm) 0.157 Volume: (cm) 0.063 % Reduction in Area: 0% % Reduction in Volume: 0% Epithelialization: Medium (34-66%) Tunneling: No Undermining: Yes Starting Position (o'clock): 12 Ending Position (o'clock): 12 Maximum Distance: (cm) 3.5 Wound Description Classification: Grade 3 Foul Od Wagner Verification: MRI Wound Margin: Flat and Intact Exudate Amount: Small Exudate Type: Serosanguineous Exudate Color: red, brown Wound Bed Granulation Amount: Large (67-100%) Granulation Quality: Pink Fascia E Necrotic Amount: None Present (0%) Fat Laye Tendon E Muscle E Joint Ex Bone Exp Treatment Notes Wound #1 (Left, Plantar Foot) 1. Cleanse With Wound Cleanser 2. Periwound Care Skin Prep 3. Primary Dressing Applied Calcium Alginate Ag 4. Secondary Dressing Roll Gauze 5. Secured With Tape 7. Footwear/Offloading device applied Felt/Foam Surgical shoe or After Cleansing: No Exposed Structure xposed: No r (Subcutaneous Tissue) Exposed: Yes xposed: No xposed: No posed: No osed: No Electronic Signature(s) Signed: 02/08/2019 6:01:30 PM By: Carlene Coria RN Signed: 02/08/2019 6:46:58 PM By: Levan Hurst RN, BSN Entered By: Levan Hurst on 02/08/2019  12:41:48 -------------------------------------------------------------------------------- [place] Details Patient Name: Date of Service: [name], [name] 02/08/2019 10:30 AM Medical Record [id number] Patient Account Number: [id number] Date of Birth/Sex: 1955-07-22 (64 y.o. Male) Treating RN: Carlene Coria Primary Care Lyah Millirons: SYSTEM, PCP Other Clinician: Referring Yarel Rushlow: Treating Waris Rodger/Extender:[name], [name] in Treatment: 0 Vital Signs Time Taken: 11:15 Temperature (F): 98.9 Height (in): 74 Pulse (bpm): 98 Source: Stated Respiratory Rate (breaths/min): 18 Weight (lbs): 238 Blood Pressure (mmHg): 162/88 Source: Stated Capillary Blood Glucose (mg/dl): 79 Body Mass Index (BMI): 30.6 Reference Range: 80 - 120 mg / dl Notes CBG per patient Electronic Signature(s) Signed: 02/08/2019 6:01:30 PM By: Carlene Coria RN Entered By: Carlene Coria on 02/08/2019 11:19:54

## 2019-02-15 ENCOUNTER — Encounter (HOSPITAL_BASED_OUTPATIENT_CLINIC_OR_DEPARTMENT_OTHER): Payer: Medicaid Other | Admitting: Internal Medicine

## 2019-02-15 ENCOUNTER — Other Ambulatory Visit: Payer: Self-pay

## 2019-02-15 DIAGNOSIS — M109 Gout, unspecified: Secondary | ICD-10-CM | POA: Insufficient documentation

## 2019-02-15 DIAGNOSIS — E1169 Type 2 diabetes mellitus with other specified complication: Secondary | ICD-10-CM | POA: Diagnosis not present

## 2019-02-15 DIAGNOSIS — E1140 Type 2 diabetes mellitus with diabetic neuropathy, unspecified: Secondary | ICD-10-CM | POA: Diagnosis not present

## 2019-02-15 DIAGNOSIS — K219 Gastro-esophageal reflux disease without esophagitis: Secondary | ICD-10-CM | POA: Diagnosis not present

## 2019-02-15 DIAGNOSIS — E1151 Type 2 diabetes mellitus with diabetic peripheral angiopathy without gangrene: Secondary | ICD-10-CM | POA: Diagnosis not present

## 2019-02-15 DIAGNOSIS — F419 Anxiety disorder, unspecified: Secondary | ICD-10-CM | POA: Insufficient documentation

## 2019-02-15 DIAGNOSIS — Z9049 Acquired absence of other specified parts of digestive tract: Secondary | ICD-10-CM | POA: Insufficient documentation

## 2019-02-15 DIAGNOSIS — L97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 of other part of left foot with fat layer exposed: Secondary | ICD-10-CM | POA: Diagnosis not present

## 2019-02-15 DIAGNOSIS — Z87442 Personal history of urinary calculi: Secondary | ICD-10-CM | POA: Insufficient documentation

## 2019-02-15 DIAGNOSIS — E11621 Type 2 diabetes mellitus with foot ulcer: Secondary | ICD-10-CM | POA: Insufficient documentation

## 2019-02-15 DIAGNOSIS — I10 Essential (primary) hypertension: Secondary | ICD-10-CM | POA: Diagnosis not present

## 2019-02-15 NOTE — Progress Notes (Signed)
[name], [name] ([id number]) Visit Report for 02/15/2019 Arrival Information Details Patient Name: Date of Service: [name], [name] 02/15/2019 10:30 AM Medical Record [id number] Patient Account Number: [id number] Date of Birth/Sex: Treating RN: 06-06-1955 (64 y.o. Male) Kela Millin Primary Care Kenyatte Gruber: SYSTEM, PCP Other Clinician: Referring Moyinoluwa Dawe: Treating Corneisha Alvi/Extender:[name], [name] in Treatment: 1 Visit Information History Since Last Visit Added or deleted any No Patient Arrived: Ambulatory medications: Arrival Time: 10:57 Any new allergies or adverse No Accompanied By: family reactions: member Had a fall or experienced change No Transfer Assistance: None in Patient Identification Verified: Yes activities of daily living that may Secondary Verification Process Yes affect Completed: risk of falls: Patient Requires Transmission-Based No Signs or symptoms of No Precautions: abuse/neglect since last visito Patient Has Alerts: No Hospitalized since last visit: No Implantable device outside of the No clinic excluding cellular tissue based products placed in the center since last visit: Has Dressing in Place as Yes Prescribed: Has Footwear/Offloading in Place Yes as Prescribed: Left: Surgical Shoe with Pressure Relief Insole Pain Present Now: No Electronic Signature(s) Signed: 02/15/2019 6:01:02 PM By: Kela Millin Entered By: Kela Millin on 02/15/2019 10:58:27 -------------------------------------------------------------------------------- Clinic Level of Care Assessment Details Patient Name: Date of Service: [name], [name] 02/15/2019 10:30 AM Medical Record [id number] Patient Account Number: [id number] Date of Birth/Sex: Treating RN: 09-Nov-1955 (63 y.o. Male) Levan Hurst Primary Care Grazia Taffe: SYSTEM, PCP Other Clinician: Referring Jaymir Struble: Treating Alin Chavira/Extender:[name], [name] in Treatment: 1 Clinic  Level of Care Assessment Items TOOL 4 Quantity Score X - Use when only an EandM is performed on FOLLOW-UP visit 1 0 ASSESSMENTS - Nursing Assessment / Reassessment X - Reassessment of Co-morbidities (includes updates in patient status) 1 10 X - Reassessment of Adherence to Treatment Plan 1 5 ASSESSMENTS - Wound and Skin Assessment / Reassessment X - Simple Wound Assessment / Reassessment - one wound 1 5 []  - Complex Wound Assessment / Reassessment - multiple wounds 0 []  - Dermatologic / Skin Assessment (not related to wound area) 0 ASSESSMENTS - Focused Assessment []  - Circumferential Edema Measurements - multi extremities 0 []  - Nutritional Assessment / Counseling / Intervention 0 X - Lower Extremity Assessment (monofilament, tuning fork, pulses) 1 5 []  - Peripheral Arterial Disease Assessment (using hand held doppler) 0 ASSESSMENTS - Ostomy and/or Continence Assessment and Care []  - Incontinence Assessment and Management 0 []  - Ostomy Care Assessment and Management (repouching, etc.) 0 PROCESS - Coordination of Care X - Simple Patient / Family Education for ongoing care 1 15 []  - Complex (extensive) Patient / Family Education for ongoing care 0 X - Staff obtains Programmer, systems, Records, Test Results / Process Orders 1 10 X - Staff telephones HHA, Nursing Homes / Clarify orders / etc 1 10 []  - Routine Transfer to another Facility (non-emergent condition) 0 []  - Routine Hospital Admission (non-emergent condition) 0 []  - New Admissions / Biomedical engineer / Ordering NPWT, Apligraf, etc. 0 []  - Emergency Hospital Admission (emergent condition) 0 X - Simple Discharge Coordination 1 10 []  - Complex (extensive) Discharge Coordination 0 PROCESS - Special Needs []  - Pediatric / Minor Patient Management 0 []  - Isolation Patient Management 0 []  - Hearing / Language / Visual special needs 0 []  - Assessment of Community assistance (transportation, D/C planning, etc.) 0 []  - Additional  assistance / Altered mentation 0 []  - Support Surface(s) Assessment (bed, cushion, seat, etc.) 0 INTERVENTIONS - Wound Cleansing / Measurement X - Simple Wound Cleansing - one wound 1 5 []  - Complex Wound Cleansing -  multiple wounds 0 X - Wound Imaging (photographs - any number of wounds) 1 5 []  - Wound Tracing (instead of photographs) 0 X - Simple Wound Measurement - one wound 1 5 []  - Complex Wound Measurement - multiple wounds 0 INTERVENTIONS - Wound Dressings []  - Small Wound Dressing one or multiple wounds 0 X - Medium Wound Dressing one or multiple wounds 1 15 []  - Large Wound Dressing one or multiple wounds 0 X - Application of Medications - topical 1 5 []  - Application of Medications - injection 0 INTERVENTIONS - Miscellaneous []  - External ear exam 0 []  - Specimen Collection (cultures, biopsies, blood, body fluids, etc.) 0 []  - Specimen(s) / Culture(s) sent or taken to Lab for analysis 0 []  - Patient Transfer (multiple staff / Civil Service fast streamer / Similar devices) 0 []  - Simple Staple / Suture removal (25 or less) 0 []  - Complex Staple / Suture removal (26 or more) 0 []  - Hypo / Hyperglycemic Management (close monitor of Blood Glucose) 0 []  - Ankle / Brachial Index (ABI) - do not check if billed separately 0 X - Vital Signs 1 5 Has the patient been seen at the hospital within the last three years: Yes Total Score: 110 Level Of Care: New/Established - Level 3 Electronic Signature(s) Signed: 02/15/2019 6:39:44 PM By: Levan Hurst RN, BSN Entered By: Levan Hurst on 02/15/2019 18:16:26 -------------------------------------------------------------------------------- Encounter Discharge Information Details Patient Name: Date of Service: [name], [name] 02/15/2019 10:30 AM Medical Record [id number] Patient Account Number: [id number] Date of Birth/Sex: Treating RN: [birth year] (63 y.o. Male) Deon Pilling Primary Care Asani Deniston: SYSTEM, PCP Other Clinician: Referring Vonnie Ligman:  Treating Emnet Monk/Extender:[name], [name] in Treatment: 1 Encounter Discharge Information Items Discharge Condition: Stable Ambulatory Status: Ambulatory Discharge Destination: Home Transportation: Private Auto Accompanied By: self Schedule Follow-up Appointment: Yes Clinical Summary of Care: Electronic Signature(s) Signed: 02/15/2019 6:01:21 PM By: Deon Pilling Entered By: Deon Pilling on 02/15/2019 15:09:52 -------------------------------------------------------------------------------- Lower Extremity Assessment Details Patient Name: Date of Service: [name], [name] 02/15/2019 10:30 AM Medical Record [id number] Patient Account Number: [id number] Date of Birth/Sex: Treating RN: [birth year] (63 y.o. Male) Kela Millin Primary Care Antwyne Pingree: SYSTEM, PCP Other Clinician: Referring Durga Saldarriaga: Treating Ahni Bradwell/Extender:[name], [name] in Treatment: 1 Edema Assessment Assessed: [Left: No] [Right: No] Edema: [Left: Ye] [Right: s] Calf Left: Right: Point of Measurement: 42 cm From Medial Instep 48 cm cm Ankle Left: Right: Point of Measurement: 13 cm From Medial Instep 26 cm cm Electronic Signature(s) Signed: 02/15/2019 6:01:02 PM By: Kela Millin Entered By: Kela Millin on 02/15/2019 10:59:52 -------------------------------------------------------------------------------- Multi Wound Chart Details Patient Name: Date of Service: [name], [name] 02/15/2019 10:30 AM Medical Record [id number] Patient Account Number: [id number] Date of Birth/Sex: Treating RN: 1955/07/08 (64 y.o. Male) Primary Care Miloh Alcocer: SYSTEM, PCP Other Clinician: Referring Makeda Peeks: Treating Giorgi Debruin/Extender:[name], [name] in Treatment: 1 Vital Signs Height(in): 74 Capillary Blood 101 Glucose(mg/dl): Weight(lbs): 238 Pulse(bpm): 70 Body Mass Index(BMI): 31 Blood Pressure(mmHg): 98/76 Temperature(F): 98.3 Respiratory 18 Rate(breaths/min): Photos: [1:No  Photos] [N/A:N/A] Wound Location: [1:Left Foot - Plantar] [N/A:N/A] Wounding Event: [1:Blister] [N/A:N/A] Primary Etiology: [1:Diabetic Wound/Ulcer of the N/A Lower Extremity] Comorbid History: [1:Hypertension, Type II Diabetes] [N/A:N/A] Date Acquired: [1:01/14/2017] [N/A:N/A] Weeks of Treatment: [1:1] [N/A:N/A] Wound Status: [1:Open] [N/A:N/A] Measurements L x W x D 0.9x0.9x0.1 [N/A:N/A] (cm) Area (cm) : [1:0.636] [N/A:N/A] Volume (cm) : [1:0.064] [N/A:N/A] % Reduction in Area: [1:-305.10%] [N/A:N/A] % Reduction in Volume: -1.60% [N/A:N/A] Classification: [1:Grade 3] [N/A:N/A] Exudate Amount: [1:Small] [N/A:N/A] Exudate Type: [1:Serous] [N/A:N/A] Exudate Color: [1:amber] [N/A:N/A] Wound Margin: [  1:Flat and Intact] [N/A:N/A] Granulation Amount: [1:Large (67-100%)] [N/A:N/A] Granulation Quality: [1:Pink] [N/A:N/A] Necrotic Amount: [1:None Present (0%)] [N/A:N/A] Exposed Structures: [1:Fat Layer (Subcutaneous N/A Tissue) Exposed: Yes Fascia: No Tendon: No Muscle: No Joint: No Bone: No Medium (34-66%)] [N/A:N/A] Treatment Notes Electronic Signature(s) Signed: 02/15/2019 6:41:12 PM By: [name] MD Entered By: [name] on 02/15/2019 13:33:58 -------------------------------------------------------------------------------- [place] Details Patient Name: Date of Service: [name], [name] 02/15/2019 10:30 AM Medical Record [id number] Patient Account Number: [id number] Date of Birth/Sex: Treating RN: 1955/09/09 (63 y.o. Male) Levan Hurst Primary Care Arleigh Dicola: SYSTEM, PCP Other Clinician: Referring Moustafa Mossa: Treating Brezlyn Manrique/Extender:[name], [name] in Treatment: 1 Active Inactive Nutrition Nursing Diagnoses: Impaired glucose control: actual or potential Potential for alteratiion in Nutrition/Potential for imbalanced nutrition Goals: Patient/caregiver agrees to and verbalizes understanding of need to use nutritional supplements and/or  vitamins as prescribed Date Initiated: 02/08/2019 Target Resolution Date: 03/12/2019 Goal Status: Active Patient/caregiver will maintain therapeutic glucose control Date Initiated: 02/08/2019 Target Resolution Date: 03/12/2019 Goal Status: Active Interventions: Assess HgA1c results as ordered upon admission and as needed Assess patient nutrition upon admission and as needed per policy Provide education on elevated blood sugars and impact on wound healing Provide education on nutrition Treatment Activities: Education provided on Nutrition : 02/08/2019 Notes: Wound/Skin Impairment Nursing Diagnoses: Impaired tissue integrity Knowledge deficit related to ulceration/compromised skin integrity Goals: Patient/caregiver will verbalize understanding of skin care regimen Date Initiated: 02/08/2019 Target Resolution Date: 03/12/2019 Goal Status: Active Ulcer/skin breakdown will have a volume reduction of 30% by week 4 Date Initiated: 02/08/2019 Target Resolution Date: 03/12/2019 Goal Status: Active Interventions: Assess patient/caregiver ability to obtain necessary supplies Assess patient/caregiver ability to perform ulcer/skin care regimen upon admission and as needed Assess ulceration(s) every visit Provide education on ulcer and skin care Notes: Electronic Signature(s) Signed: 02/15/2019 6:39:44 PM By: Levan Hurst RN, BSN Entered By: Levan Hurst on 02/15/2019 18:15:53 -------------------------------------------------------------------------------- Pain Assessment Details Patient Name: Date of Service: [name], [name] 02/15/2019 10:30 AM Medical Record [id number] Patient Account Number: [id number] Date of Birth/Sex: Treating RN: 11-03-1955 (63 y.o. Male) Kela Millin Primary Care Caitlyn Buchanan: SYSTEM, PCP Other Clinician: Referring Eleny Cortez: Treating Deneise Getty/Extender:[name], [name] in Treatment: 1 Active Problems Location of Pain Severity and Description of  Pain Patient Has Paino No Site Locations Pain Management and Medication Current Pain Management: Electronic Signature(s) Signed: 02/15/2019 6:01:02 PM By: Kela Millin Entered By: Kela Millin on 02/15/2019 10:59:27 -------------------------------------------------------------------------------- Patient/Caregiver Education Details Patient Name: [name] 2/1/2021andnbsp10:30 Date of Service: AM Medical Record [id number] Number: Patient Account Number: [id number] Treating RN: 1955-12-31 (63 y.o. Levan Hurst Date of Birth/Gender: Male) Other Clinician: Primary Care Treating SYSTEM, PCP [name] Physician: Physician/Extender: Referring Physician: Suella [name] in Treatment: 1 Education Assessment Education Provided To: Patient Education Topics Provided Wound/Skin Impairment: Methods: Explain/Verbal Responses: State content correctly Electronic Signature(s) Signed: 02/15/2019 6:39:44 PM By: Levan Hurst RN, BSN Entered By: Levan Hurst on 02/15/2019 18:16:02 -------------------------------------------------------------------------------- Wound Assessment Details Patient Name: Date of Service: [name], [name] 02/15/2019 10:30 AM Medical Record [id number] Patient Account Number: [id number] Date of Birth/Sex: Treating RN: [birth year] (63 y.o. Male) Kela Millin Primary Care Arsalan Brisbin: SYSTEM, PCP Other Clinician: Referring Kortlyn Koltz: Treating Pattie Flaharty/Extender:[name], [name] in Treatment: 1 Wound Status Wound Number: 1 Primary Diabetic Wound/Ulcer of the Lower Etiology: Extremity Wound Location: Left Foot - Plantar Wound Status: Open Wounding Event: Blister Comorbid Hypertension, Type II Diabetes Date Acquired: 01/14/2017 History: Weeks Of Treatment: 1 Clustered Wound: No Wound Measurements Length: (cm) 0.9 % Reduction in Width: (cm) 0.9 % Reduction in Depth: (cm)  0.1 Epithelializat Area: (cm) 0.636 Tunneling: Volume: (cm)  0.064 Undermining: Wound Description Classification: Grade 3 Foul Odor Afte Wound Margin: Flat and Intact Slough/Fibrino Exudate Amount: Small Exudate Type: Serous Exudate Color: amber Wound Bed Granulation Amount: Large (67-100%) Granulation Quality: Pink Fascia Exposed Necrotic Amount: None Present (0%) Fat Layer (Sub Tendon Exposed Muscle Exposed Joint Exposed: Bone Exposed: r Cleansing: No No Exposed Structure : No cutaneous Tissue) Exposed: Yes : No : No No No Area: -305.1% Volume: -1.6% ion: Medium (34-66%) No No Treatment Notes Wound #1 (Left, Plantar Foot) 1. Cleanse With Wound Cleanser 3. Primary Dressing Applied Calcium Alginate Ag 4. Secondary Dressing Dry Gauze Roll Gauze Foam 5. Secured With Tape 7. Footwear/Offloading device applied Felt/Foam Surgical shoe Notes secondary foam is a foam donut. Electronic Signature(s) Signed: 02/15/2019 6:01:02 PM By: Kela Millin Entered By: Kela Millin on 02/15/2019 11:02:36 -------------------------------------------------------------------------------- Vitals Details Patient Name: Date of Service: [name], [name] 02/15/2019 10:30 AM Medical Record [id number] Patient Account Number: [id number] Date of Birth/Sex: Treating RN: [birth year] (63 y.o. Male) Kela Millin Primary Care Becka Lagasse: SYSTEM, PCP Other Clinician: Referring Bobak Oguinn: Treating Brynlea Spindler/Extender:[name], [name] in Treatment: 1 Vital Signs Time Taken: 10:55 Temperature (F): 98.3 Height (in): 74 Pulse (bpm): 70 Weight (lbs): 238 Respiratory Rate (breaths/min): 18 Body Mass Index (BMI): 30.6 Blood Pressure (mmHg): 98/76 Capillary Blood Glucose (mg/dl): 101 Reference Range: 80 - 120 mg / dl Notes patient stated CBG was 101 this morning Electronic Signature(s) Signed: 02/15/2019 6:01:02 PM By: Kela Millin Entered By: Kela Millin on 02/15/2019 10:59:21

## 2019-02-15 NOTE — Progress Notes (Signed)
NIKI, COSMAN ([id number]) Visit Report for 02/15/2019 HPI Details Patient Name: Date of Service: LENNART, GLADISH 02/15/2019 10:30 AM Medical Record [id number] Patient Account Number: [id number] Date of Birth/Sex: 23-Jan-1955 (64 y.o. Male) Treating RN: Primary Care Provider: SYSTEM, PCP Other Clinician: Referring Provider: Treating Provider/Extender:Fumiye Lubben, [name] in Treatment: 1 History of Present Illness HPI Description: ADMISSION 02/08/2019 Patient is a 64 year old man who lives in [place]. He comes to clinic today accompanied by his DFS social worker from [place]. The story is that he got a blister and a wound on his foot from a prolonged walk he did almost a year ago after his car broke down. By review of his own pictures on his phone the wound was substantial. He comes with a nice note from his physician at [facility] wound care center. Apparently when he presented with his wound in May 2020 he had a wound on the midfoot with necrosis and gangrene which required extensive incision and drainage. He was treated with a course of Dermagraft with some improvement. He developed an acute infection in December 2020 requiring hospitalization again and he again underwent incision and debridement. Culture reviewed Staph aureus which was oxacillin resistant i.e. MRSA. An MRI during this hospitalization revealed osteomyelitis involving of the shaft and remanence of the head of the first metatarsal and the proximal phalanx of the great toe. There is also osteomyelitis involving the entire second and third metatarsals in the proximal phalanx of the second and third toes. He is on a 6-week course of linezolid and rifampin which she comes into clinic currently taking although I am not completely certain how far along he is in the this course of antibiotics it would apparently be at least a month. He is tolerating these well. Patient tells me he did not tolerate a forefoot  offloading boot and he is supposed to be using a surgical sandal however he arrives in clinic today with an old running shoe on. He is using silver alginate on the wounds Past medical history; type 2 diabetes with mild neuropathy, hypertension, gout, lumbar radiculopathy, GERD, nephrolithiasis, back surgery, cholecystectomy, bilateral lower extremity edema, Nash, pyogenic arthritis of the shoulder in 2010 ABI in our clinic was 0.93 on the left. Previous ABIs quoted in the notes from Dr. Tye Maryland also shows normal ABIs right and left 2/1; patient again with superficial areas on the base second and third MTPs on the left foot. As pointed out by her intake nurse palpation of the area reveals almost hollow feeling under the skin although nothing looks overtly infected here. We have been using silver alginate Electronic Signature(s) Signed: 02/15/2019 6:41:12 PM By: [name] MD Entered By: [name] on 02/15/2019 13:35:45 -------------------------------------------------------------------------------- Physical Exam Details Patient Name: Date of Service: MARCH, STEYER 02/15/2019 10:30 AM Medical Record [id number] Patient Account Number: [id number] Date of Birth/Sex: 1955-07-19 (64 y.o. Male) Treating RN: Primary Care Provider: SYSTEM, PCP Other Clinician: Referring Provider: Treating Provider/Extender:Sparsh Callens, [name] in Treatment: 1 Cardiovascular Pedal pulses are palpable on the left. Integumentary (Hair, Skin) No erythema around the wound. Notes Exam; the area on the plantar left foot just proximal to the second and third met heads. Superficial wound areas however I do not know that these were well granulated underneath they feel somewhat hollow underneath nevertheless the wounds themselves are superficial there is no evidence of surrounding infection Electronic Signature(s) Signed: 02/15/2019 6:41:12 PM By: [name] MD Entered By: [name] on 02/15/2019  13:36:44 -------------------------------------------------------------------------------- Physician Orders Details Patient Name: Date  of Service: ARUL, FARABEE 02/15/2019 10:30 AM Medical Record [id number] Patient Account Number: [id number] Date of Birth/Sex: 02/19/1955 (64 y.o. Male) Treating RN: Levan Hurst Primary Care Provider: SYSTEM, PCP Other Clinician: Referring Provider: Treating Provider/Extender:Sophiana Milanese, [name] in Treatment: 1 Verbal / Phone Orders: No Diagnosis Coding ICD-10 Coding Code Description E11.621 Type 2 diabetes mellitus with foot ulcer L97.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 of other part of left foot with fat layer exposed M86.672 Other chronic osteomyelitis, left ankle and foot E11.621 Type 2 diabetes mellitus with foot ulcer Follow-up Appointments Return Appointment in 1 week. Dressing Change Frequency Wound #1 Left,Plantar Foot Change Dressing every other day. Wound Cleansing Wound #1 Left,Plantar Foot Clean wound with Wound Cleanser - or normal saline Primary Wound Dressing Wound #1 Left,Plantar Foot Calcium Alginate with Silver Secondary Dressing Foam - foam donut Kerlix/Rolled Gauze - secure with tape Dry Gauze Other: - felt inside surgical shoe Off-Loading Open toe surgical shoe to: - left foot [facility] for wound care. - Advanced Hyperbaric Oxygen Therapy Evaluate for HBO Therapy Indication: - Wagner 3 Diabetic Foot Ulcer - Left foot If appropriate for treatment, begin HBOT per protocol: 2.5 ATA for 90 Minutes with 2 Five (5) Minute Air Breaks Total Number of Treatments: - 40 One treatments per day (delivered Monday through Friday unless otherwise specified in Special Instructions below): Antihistamine 30 minutes prior to HBO Treatment, difficulty clearing ears. Finger stick Blood Glucose Pre- and Post- HBOT Treatment. Follow Hyperbaric Oxygen Glycemia Protocol GLYCEMIA INTERVENTIONS  PROTOCOL PRE-HBO GLYCEMIA INTERVENTIONS ACTION INTERVENTION Obtain pre-HBO capillary blood 1 glucose (ensure physician order is in chart). A. Notify HBO physician and await physician orders. 2 If result is 70 mg/dl or below: B. If the result meets the hospital definition of a critical result, follow hospital policy. A. Give patient an 8 ounce Glucerna Shake, an 8 ounce Ensure, or 8 ounces of a Glucerna/Ensure equivalent dietary supplement*. B. Wait 30 minutes. C. Retest patients capillary blood If result is 71 mg/dl to 130 mg/dl: glucose (CBG). D. If result greater than or equal to 110 mg/dl, proceed with HBO. If result less than 110 mg/dl, notify HBO physician and consider holding HBO. If result is 131 mg/dl to 249 mg/dl: A. Proceed with HBO. A. Notify HBO physician and await physician orders. B. It is recommended to hold HBO If result is 250 mg/dl or greater: and do blood/urine ketone testing. C. If the result meets the hospital definition of a critical result, follow hospital policy. POST-HBO GLYCEMIA INTERVENTIONS ACTION INTERVENTION Obtain post HBO capillary blood 1 glucose (ensure physician order is in chart). A. Notify HBO physician and await physician orders. 2 If result is 70 mg/dl or below: B. If the result meets the hospital definition of a critical result, follow hospital policy. A. Give patient an 8 ounce Glucerna Shake, an 8 ounce Ensure, or 8 ounces of a Glucerna/Ensure equivalent dietary supplement*. B. Wait 15 minutes for symptoms of hypoglycemia (i.e. nervousness, anxiety, sweating, chills, If result is 71 mg/dl to 100 mg/dl: clamminess, irritability, confusion, tachycardia or dizziness). C. If patient asymptomatic, discharge patient. If patient symptomatic, repeat capillary blood glucose (CBG) and notify HBO physician. If result is 101 mg/dl to 249 mg/dl: A. Discharge patient. A. Notify HBO physician and await physician orders. B.  It is recommended to do If result is 250 mg/dl or greater: blood/urine ketone testing. C. If the result meets the hospital definition of a critical result, follow hospital policy. *Juice or candies are  NOT equivalent products. If patient refuses the Glucerna or Ensure, please consult the hospital dietitian for an appropriate substitute. Electronic Signature(s) Signed: 02/15/2019 6:39:44 PM By: Levan Hurst RN, BSN Signed: 02/15/2019 6:41:12 PM By: [name] MD Entered By: Levan Hurst on 02/15/2019 11:47:29 -------------------------------------------------------------------------------- Problem List Details Patient Name: Date of Service: JUANJESUS, PEPPERMAN 02/15/2019 10:30 AM Medical Record [id number] Patient Account Number: [id number] Date of Birth/Sex: 1955/04/14 (63 y.o. Male) Treating RN: Levan Hurst Primary Care Provider: SYSTEM, PCP Other Clinician: Referring Provider: Treating Provider/Extender:Linzie Criss, [name] in Treatment: 1 Active Problems ICD-10 Evaluated Encounter Evaluated Encounter Code Description Active Date Today Diagnosis E11.621 Type 2 diabetes mellitus with foot ulcer 02/08/2019 No Yes L97.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 of other part of left foot 02/08/2019 No Yes with fat layer exposed M86.672 Other chronic osteomyelitis, left ankle and foot 02/08/2019 No Yes E11.621 Type 2 diabetes mellitus with foot ulcer 02/08/2019 No Yes Inactive Problems Resolved Problems Electronic Signature(s) Signed: 02/15/2019 6:41:12 PM By: [name] MD Entered By: [name] on 02/15/2019 13:33:34 -------------------------------------------------------------------------------- Progress Note Details Patient Name: Date of Service: SHYLO, DILLENBECK 02/15/2019 10:30 AM Medical Record [id number] Patient Account Number: [id number] Date of Birth/Sex: September 27, 1955 (64 y.o. Male) Treating RN: Primary Care Provider: SYSTEM, PCP Other Clinician: Referring  Provider: Treating Provider/Extender:Jaiyden Laur, [name] in Treatment: 1 Subjective History of Present Illness (HPI) ADMISSION 02/08/2019 Patient is a 64 year old man who lives in [place]. He comes to clinic today accompanied by his DFS social worker from [place]. The story is that he got a blister and a wound on his foot from a prolonged walk he did almost a year ago after his car broke down. By review of his own pictures on his phone the wound was substantial. He comes with a nice note from his physician at [facility] wound care center. Apparently when he presented with his wound in May 2020 he had a wound on the midfoot with necrosis and gangrene which required extensive incision and drainage. He was treated with a course of Dermagraft with some improvement. He developed an acute infection in December 2020 requiring hospitalization again and he again underwent incision and debridement. Culture reviewed Staph aureus which was oxacillin resistant i.e. MRSA. An MRI during this hospitalization revealed osteomyelitis involving of the shaft and remanence of the head of the first metatarsal and the proximal phalanx of the great toe. There is also osteomyelitis involving the entire second and third metatarsals in the proximal phalanx of the second and third toes. He is on a 6-week course of linezolid and rifampin which she comes into clinic currently taking although I am not completely certain how far along he is in the this course of antibiotics it would apparently be at least a month. He is tolerating these well. Patient tells me he did not tolerate a forefoot offloading boot and he is supposed to be using a surgical sandal however he arrives in clinic today with an old running shoe on. He is using silver alginate on the wounds Past medical history; type 2 diabetes with mild neuropathy, hypertension, gout, lumbar radiculopathy, GERD, nephrolithiasis, back surgery, cholecystectomy,  bilateral lower extremity edema, Nash, pyogenic arthritis of the shoulder in 2010 ABI in our clinic was 0.93 on the left. Previous ABIs quoted in the notes from Dr. Tye Maryland also shows normal ABIs right and left 2/1; patient again with superficial areas on the base second and third MTPs on the left foot. As pointed out by her intake nurse palpation of the area  reveals almost hollow feeling under the skin although nothing looks overtly infected here. We have been using silver alginate Objective Constitutional Vitals Time Taken: 10:55 AM, Height: 74 in, Weight: 238 lbs, BMI: 30.6, Temperature: 98.3 F, Pulse: 70 bpm, Respiratory Rate: 18 breaths/min, Blood Pressure: 98/76 mmHg, Capillary Blood Glucose: 101 mg/dl. General Notes: patient stated CBG was 101 this morning Cardiovascular Pedal pulses are palpable on the left. General Notes: Exam; the area on the plantar left foot just proximal to the second and third met heads. Superficial wound areas however I do not know that these were well granulated underneath they feel somewhat hollow underneath nevertheless the wounds themselves are superficial there is no evidence of surrounding infection Integumentary (Hair, Skin) No erythema around the wound. Wound #1 status is Open. Original cause of wound was Blister. The wound is located on the [place]. The wound measures 0.9cm length x 0.9cm width x 0.1cm depth; 0.636cm^2 area and 0.064cm^3 volume. There is Fat Layer (Subcutaneous Tissue) Exposed exposed. There is no tunneling or undermining noted. There is a small amount of serous drainage noted. The wound margin is flat and intact. There is large (67-100%) pink granulation within the wound bed. There is no necrotic tissue within the wound bed. Assessment Active Problems ICD-10 Type 2 diabetes mellitus with foot ulcer Non-pressure chronic ulcer of other part of left foot with fat layer exposed Other chronic osteomyelitis, left ankle and  foot Type 2 diabetes mellitus with foot ulcer Plan Follow-up Appointments: Return Appointment in 1 week. Dressing Change Frequency: Wound #1 Left,Plantar Foot: Change Dressing every other day. Wound Cleansing: Wound #1 Left,Plantar Foot: Clean wound with Wound Cleanser - or normal saline Primary Wound Dressing: Wound #1 Left,Plantar Foot: Calcium Alginate with Silver Secondary Dressing: Foam - foam donut Kerlix/Rolled Gauze - secure with tape Dry Gauze Other: - felt inside surgical shoe Off-Loading: Open toe surgical shoe to: - left foot Home Health: [facility] for wound care. - Advanced Hyperbaric Oxygen Therapy: Evaluate for HBO Therapy Indication: - Wagner 3 Diabetic Foot Ulcer - Left foot If appropriate for treatment, begin HBOT per protocol: 2.5 ATA for 90 Minutes with 2 Five (5) Minute Air Breaks Total Number of Treatments: - 40 One treatments per day (delivered Monday through Friday unless otherwise specified in Special Instructions below): Antihistamine 30 minutes prior to HBO Treatment, difficulty clearing ears. Finger stick Blood Glucose Pre- and Post- HBOT Treatment. Follow Hyperbaric Oxygen Glycemia Protocol 1. Superficial wounds but with extensive underlying osteomyelitis previously documented. He is remained on linezolid and rifampin 2. He is a candidate for hyperbaric oxygen for Wagner's grade 3 wounds. We have put this through [facility] which is his primary insurance. We have not heard back yet 3. He is wearing a surgical off loader which seems to have done a good job offloading these wounds at least for now. He has balance problems which may preclude more aggressive offloading even if that is necessary Electronic Signature(s) Signed: 02/15/2019 6:41:12 PM By: [name] MD Entered By: [name] on 02/15/2019 13:37:44 -------------------------------------------------------------------------------- SuperBill  Details Patient Name: Date of Service: CHISTIAN, KASLER 02/15/2019 Medical Record [id number] Patient Account Number: [id number] Date of Birth/Sex: Treating RN: Apr 16, 1955 (64 y.o. Male) Primary Care Provider: SYSTEM, PCP Other Clinician: Referring Provider: Treating Provider/Extender:Karen Kinnard, [name] in Treatment: 1 Diagnosis Coding ICD-10 Codes Code Description E11.621 Type 2 diabetes mellitus with foot ulcer L97.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 of other part of left foot with fat layer exposed M86.672 Other  chronic osteomyelitis, left ankle and foot E11.621 Type 2 diabetes mellitus with foot ulcer Facility Procedures CPT4 Code: [id number] Description: 44818 - WOUND CARE VISIT-LEV 3 EST PT Modifier: Quantity: 1 Physician Procedures CPT4 Code Description: [id number] - WC PHYS LEVEL 3 - EST PT ICD-10 Diagnosis Description E11.621 Type 2 diabetes mellitus with foot ulcer L97.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 of other part of left foot w C58.850 Other chronic osteomyelitis, left ankle  and foot Modifier: ith fat layer ex Quantity: 1 posed Electronic Signature(s) Signed: 02/15/2019 6:39:44 PM By: Levan Hurst RN, BSN Signed: 02/15/2019 6:41:12 PM By: [name] MD Entered By: Levan Hurst on 02/15/2019 18:16:34

## 2019-02-22 ENCOUNTER — Encounter (HOSPITAL_BASED_OUTPATIENT_CLINIC_OR_DEPARTMENT_OTHER): Payer: Medicaid Other | Admitting: Internal Medicine

## 2019-02-23 ENCOUNTER — Encounter (HOSPITAL_BASED_OUTPATIENT_CLINIC_OR_DEPARTMENT_OTHER): Payer: Medicaid Other | Admitting: Internal Medicine

## 2019-02-24 ENCOUNTER — Encounter (HOSPITAL_BASED_OUTPATIENT_CLINIC_OR_DEPARTMENT_OTHER): Payer: Medicaid Other | Admitting: Physician Assistant

## 2019-02-25 ENCOUNTER — Encounter (HOSPITAL_BASED_OUTPATIENT_CLINIC_OR_DEPARTMENT_OTHER): Payer: Medicaid Other | Attending: Internal Medicine | Admitting: Internal Medicine

## 2019-02-25 ENCOUNTER — Other Ambulatory Visit (HOSPITAL_COMMUNITY)
Admission: RE | Admit: 2019-02-25 | Discharge: 2019-02-25 | Disposition: A | Discharge status: Home / Self Care | Payer: Medicaid Other | Source: Other Acute Inpatient Hospital | Attending: Internal Medicine | Admitting: Internal Medicine

## 2019-02-25 ENCOUNTER — Encounter (HOSPITAL_BASED_OUTPATIENT_CLINIC_OR_DEPARTMENT_OTHER): Payer: Medicaid Other | Admitting: Internal Medicine

## 2019-02-25 ENCOUNTER — Other Ambulatory Visit: Payer: Self-pay

## 2019-02-25 DIAGNOSIS — E11621 Type 2 diabetes mellitus with foot ulcer: Secondary | ICD-10-CM | POA: Insufficient documentation

## 2019-02-26 ENCOUNTER — Encounter (HOSPITAL_BASED_OUTPATIENT_CLINIC_OR_DEPARTMENT_OTHER): Payer: Medicaid Other | Admitting: Internal Medicine

## 2019-02-26 NOTE — Progress Notes (Signed)
AJA, BOLANDER ([id number]) Visit Report for 02/25/2019 Debridement Details Patient Name: Date of Service: [name], [name] 02/25/2019 1:30 PM Medical Record [id number] Patient Account Number: [id number] Date of Birth/Sex: Oct 14, 1955 (63 y.o. M) Treating RN: Deon Pilling Primary Care Provider: SYSTEM, PCP Other Clinician: Referring Provider: Treating Provider/Extender:Eileen Croswell, [name] in Treatment: 2 Debridement Performed for Wound #1 Left,Plantar Foot Assessment: Performed By: Physician [name], MD Debridement Type: Debridement Severity of Tissue Pre Fat layer exposed Debridement: Level of Consciousness (Pre- Awake and Alert procedure): Pre-procedure Verification/Time Out Taken: Yes - 14:35 Start Time: 14:36 Pain Control: Lidocaine 4% Topical Solution Total Area Debrided (L x W): 1 (cm) x 1 (cm) = 1 (cm) Tissue and other material Viable, Non-Viable, Subcutaneous, Skin: Dermis , Fibrin/Exudate debrided: Level: Skin/Subcutaneous Tissue Debridement Description: Excisional Instrument: Curette Bleeding: Minimum Hemostasis Achieved: Pressure End Time: 14:40 Procedural Pain: 0 Post Procedural Pain: 0 Response to Treatment: Procedure was tolerated well Level of Consciousness Awake and Alert (Post-procedure): Post Debridement Measurements of Total Wound Length: (cm) 0.5 Width: (cm) 0.5 Depth: (cm) 0.5 Volume: (cm) 0.098 Character of Wound/Ulcer Post Improved Debridement: Severity of Tissue Post Debridement: Fat layer exposed Post Procedure Diagnosis Same as Pre-procedure Electronic Signature(s) Signed: 02/25/2019 5:57:11 PM By: Deon Pilling Signed: 02/26/2019 1:06:39 PM By: [name] MD Entered By: [name] on 02/25/2019 14:50:27 -------------------------------------------------------------------------------- HPI Details Patient Name: Date of Service: [name] 02/25/2019 1:30 PM Medical Record [id number] Patient Account  Number: [id number] Date of Birth/Sex: Treating RN: [birth year] (64 y.o. [name] Primary Care Provider: SYSTEM, PCP Other Clinician: Referring Provider: Treating Provider/Extender:Divinity Kyler, [name] in Treatment: 2 History of Present Illness HPI Description: ADMISSION 02/08/2019 Patient is a 64 year old man who lives in [place]. He comes to clinic today accompanied by his DFS social worker from [place]. The story is that he got a blister and a wound on his foot from a prolonged walk he did almost a year ago after his car broke down. By review of his own pictures on his phone the wound was substantial. He comes with a nice note from his physician at [facility] wound care center. Apparently when he presented with his wound in May 2020 he had a wound on the midfoot with necrosis and gangrene which required extensive incision and drainage. He was treated with a course of Dermagraft with some improvement. He developed an acute infection in December 2020 requiring hospitalization again and he again underwent incision and debridement. Culture reviewed Staph aureus which was oxacillin resistant i.e. MRSA. An MRI during this hospitalization revealed osteomyelitis involving of the shaft and remanence of the head of the first metatarsal and the proximal phalanx of the great toe. There is also osteomyelitis involving the entire second and third metatarsals in the proximal phalanx of the second and third toes. He is on a 6-week course of linezolid and rifampin which she comes into clinic currently taking although I am not completely certain how far along he is in the this course of antibiotics it would apparently be at least a month. He is tolerating these well. Patient tells me he did not tolerate a forefoot offloading boot and he is supposed to be using a surgical sandal however he arrives in clinic today with an old running shoe on. He is using silver alginate on the  wounds Past medical history; type 2 diabetes with mild neuropathy, hypertension, gout, lumbar radiculopathy, GERD, nephrolithiasis, back surgery, cholecystectomy, bilateral lower extremity edema, Nash, pyogenic arthritis of the shoulder in 2010 ABI  in our clinic was 0.93 on the left. Previous ABIs quoted in the notes from Dr. Tye Maryland also shows normal ABIs right and left 2/1; patient again with superficial areas on the base second and third MTPs on the left foot. As pointed out by her intake nurse palpation of the area reveals almost hollow feeling under the skin although nothing looks overtly infected here. We have been using silver alginate 2/11; the area opened into a deeper wound although we have been somewhat concerned about the underlying tissue here. This could have been subdermal fluid. He also snapped a strap on his healing sandal and was wearing a running shoe when he came in today. That may have had something to do with it. We have been using silver alginate We have had some trouble getting his insurance to recognize the Wednesday alternative provider in this clinic Electronic Signature(s) Signed: 02/26/2019 1:06:39 PM By: [name] MD Entered By: [name] on 02/25/2019 14:51:49 -------------------------------------------------------------------------------- Physical Exam Details Patient Name: Date of Service: [name] 02/25/2019 1:30 PM Medical Record [id number] Patient Account Number: [id number] Date of Birth/Sex: Treating RN: 12-10-1955 (64 y.o. [name] Primary Care Provider: SYSTEM, PCP Other Clinician: Referring Provider: Treating Provider/Extender:Simeon Vera, [name] in Treatment: 2 Constitutional Patient is hypertensive.. Pulse regular and within target range for patient.Marland Kitchen Respirations regular, non-labored and within target range.Marland Kitchen Appears in no distress. Notes Wound exam; the area on the plantar foot just proximal to the second and  third met heads. Unfortunately this is no longer an "superficial wound". This is opened into a deeper wound area with surrounding thick skin. I think this was simply a vulnerable area that is open. Required extensive debridement with a #5 curette. There was no purulence. Electronic Signature(s) Signed: 02/26/2019 1:06:39 PM By: [name] MD Entered By: [name] on 02/25/2019 14:52:56 -------------------------------------------------------------------------------- Physician Orders Details Patient Name: Date of Service: WOODLEY, PETZOLD 02/25/2019 1:30 PM Medical Record [id number] Patient Account Number: [id number] Date of Birth/Sex: Treating RN: 02/15/1955 (64 y.o. [name] Primary Care Provider: SYSTEM, PCP Other Clinician: Referring Provider: Treating Provider/Extender:Pelham Hennick, [name] in Treatment: 2 Verbal / Phone Orders: No Diagnosis Coding ICD-10 Coding Code Description E11.621 Type 2 diabetes mellitus with foot ulcer L97.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 of other part of left foot with fat layer exposed M86.672 Other chronic osteomyelitis, left ankle and foot E11.621 Type 2 diabetes mellitus with foot ulcer Follow-up Appointments Return Appointment in 1 week. Dressing Change Frequency Wound #1 Left,Plantar Foot Change Dressing every other day. Wound Cleansing Wound #1 Left,Plantar Foot Clean wound with Wound Cleanser - or normal saline Primary Wound Dressing Wound #1 Left,Plantar Foot Calcium Alginate with Silver Secondary Dressing Foam - foam donut Kerlix/Rolled Gauze - secure with tape Dry Gauze Edema Control Avoid standing for long periods of time Elevate legs to the level of the heart or above for 30 minutes daily and/or when sitting, a frequency of: - throughout the day. Off-Loading Open toe surgical shoe to: - left foot felt the [facility] for wound care. - [facility] Bacteria  identified in Unspecified specimen by Anaerobe culture (MICRO) - culture of left foot. LOINC Code: 503-5 Convenience Name: Anerobic culture Electronic Signature(s) Signed: 02/25/2019 5:57:11 PM By: Deon Pilling Signed: 02/26/2019 1:06:39 PM By: [name] MD Entered By: Deon Pilling on 02/25/2019 14:45:57 -------------------------------------------------------------------------------- Problem List Details Patient Name: Date of Service: TAHMIR, KLECKNER 02/25/2019 1:30 PM Medical Record [id number] Patient Account Number: [id number] Date of Birth/Sex: Treating RN:  06-11-1955 (64 y.o. [name] Primary Care Provider: SYSTEM, PCP Other Clinician: Referring Provider: Treating Provider/Extender:Hyrum Shaneyfelt, [name] in Treatment: 2 Active Problems ICD-10 Evaluated Encounter Code Description Active Date Today Diagnosis E11.621 Type 2 diabetes mellitus with foot ulcer 02/08/2019 No Yes L97.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 of other part of left foot 02/08/2019 No Yes with fat layer exposed M86.672 Other chronic osteomyelitis, left ankle and foot 02/08/2019 No Yes E11.621 Type 2 diabetes mellitus with foot ulcer 02/08/2019 No Yes Inactive Problems Resolved Problems Electronic Signature(s) Signed: 02/26/2019 1:06:39 PM By: [name] MD Entered By: [name] on 02/25/2019 14:50:03 -------------------------------------------------------------------------------- Progress Note Details Patient Name: Date of Service: FABIANO, GINLEY 02/25/2019 1:30 PM Medical Record [id number] Patient Account Number: [id number] Date of Birth/Sex: Treating RN: [birth year] (64 y.o. [name] Primary Care Provider: SYSTEM, PCP Other Clinician: Referring Provider: Treating Provider/Extender:Nikia Levels, [name] in Treatment: 2 Subjective History of Present Illness (HPI) ADMISSION 02/08/2019 Patient is a 64 year old man who lives in [place]. He comes to clinic  today accompanied by his DFS social worker from [place]. The story is that he got a blister and a wound on his foot from a prolonged walk he did almost a year ago after his car broke down. By review of his own pictures on his phone the wound was substantial. He comes with a nice note from his physician at [facility] wound care center. Apparently when he presented with his wound in May 2020 he had a wound on the midfoot with necrosis and gangrene which required extensive incision and drainage. He was treated with a course of Dermagraft with some improvement. He developed an acute infection in December 2020 requiring hospitalization again and he again underwent incision and debridement. Culture reviewed Staph aureus which was oxacillin resistant i.e. MRSA. An MRI during this hospitalization revealed osteomyelitis involving of the shaft and remanence of the head of the first metatarsal and the proximal phalanx of the great toe. There is also osteomyelitis involving the entire second and third metatarsals in the proximal phalanx of the second and third toes. He is on a 6-week course of linezolid and rifampin which she comes into clinic currently taking although I am not completely certain how far along he is in the this course of antibiotics it would apparently be at least a month. He is tolerating these well. Patient tells me he did not tolerate a forefoot offloading boot and he is supposed to be using a surgical sandal however he arrives in clinic today with an old running shoe on. He is using silver alginate on the wounds Past medical history; type 2 diabetes with mild neuropathy, hypertension, gout, lumbar radiculopathy, GERD, nephrolithiasis, back surgery, cholecystectomy, bilateral lower extremity edema, Nash, pyogenic arthritis of the shoulder in 2010 ABI in our clinic was 0.93 on the left. Previous ABIs quoted in the notes from Dr. Tye Maryland also shows normal ABIs right and left 2/1; patient  again with superficial areas on the base second and third MTPs on the left foot. As pointed out by her intake nurse palpation of the area reveals almost hollow feeling under the skin although nothing looks overtly infected here. We have been using silver alginate 2/11; the area opened into a deeper wound although we have been somewhat concerned about the underlying tissue here. This could have been subdermal fluid. He also snapped a strap on his healing sandal and was wearing a running shoe when he came in today. That may have had something to do  with it. We have been using silver alginate We have had some trouble getting his insurance to recognize the Wednesday alternative provider in this clinic Objective Constitutional Patient is hypertensive.. Pulse regular and within target range for patient.Marland Kitchen Respirations regular, non-labored and within target range.Marland Kitchen Appears in no distress. Vitals Time Taken: 1:58 PM, Height: 74 in, Weight: 238 lbs, BMI: 30.6, Temperature: 98.8 F, Pulse: 73 bpm, Respiratory Rate: 18 breaths/min, Blood Pressure: 152/87 mmHg, Capillary Blood Glucose: 106 mg/dl. General Notes: Wound exam; the area on the plantar foot just proximal to the second and third met heads. Unfortunately this is no longer an "superficial wound". This is opened into a deeper wound area with surrounding thick skin. I think this was simply a vulnerable area that is open. Required extensive debridement with a #5 curette. There was no purulence. Integumentary (Hair, Skin) Wound #1 status is Open. Original cause of wound was Blister. The wound is located on the [place]. The wound measures 0.5cm length x 0.3cm width x 0.6cm depth; 0.118cm^2 area and 0.071cm^3 volume. There is Fat Layer (Subcutaneous Tissue) Exposed exposed. There is no tunneling noted, however, there is undermining starting at 12:00 and ending at 12:00 with a maximum distance of 0.8cm. There is a medium amount of  serosanguineous drainage noted. The wound margin is flat and intact. There is large (67-100%) pink, pale granulation within the wound bed. There is no necrotic tissue within the wound bed. Assessment Active Problems ICD-10 Type 2 diabetes mellitus with foot ulcer Non-pressure chronic ulcer of other part of left foot with fat layer exposed Other chronic osteomyelitis, left ankle and foot Type 2 diabetes mellitus with foot ulcer Procedures Wound #1 Pre-procedure diagnosis of Wound #1 is a Diabetic Wound/Ulcer of the Lower Extremity located on the Left,Plantar Foot .Severity of Tissue Pre Debridement is: Fat layer exposed. There was a Excisional Skin/Subcutaneous Tissue Debridement with a total area of 1 sq cm performed by [name], MD. With the following instrument(s): Curette to remove Viable and Non-Viable tissue/material. Material removed includes Subcutaneous Tissue, Skin: Dermis, and Fibrin/Exudate after achieving pain control using Lidocaine 4% Topical Solution. A time out was conducted at 14:35, prior to the start of the procedure. A Minimum amount of bleeding was controlled with Pressure. The procedure was tolerated well with a pain level of 0 throughout and a pain level of 0 following the procedure. Post Debridement Measurements: 0.5cm length x 0.5cm width x 0.5cm depth; 0.098cm^3 volume. Character of Wound/Ulcer Post Debridement is improved. Severity of Tissue Post Debridement is: Fat layer exposed. Post procedure Diagnosis Wound #1: Same as Pre-Procedure Plan Follow-up Appointments: Return Appointment in 1 week. Dressing Change Frequency: Wound #1 Left,Plantar Foot: Change Dressing every other day. Wound Cleansing: Wound #1 Left,Plantar Foot: Clean wound with Wound Cleanser - or normal saline Primary Wound Dressing: Wound #1 Left,Plantar Foot: Calcium Alginate with Silver Secondary Dressing: Foam - foam donut Kerlix/Rolled Gauze - secure with tape Dry  Gauze Edema Control: Avoid standing for long periods of time Elevate legs to the level of the heart or above for 30 minutes daily and/or when sitting, a frequency of: - throughout the day. Off-Loading: Open toe surgical shoe to: - left foot felt the shoe Home Health: [place] [facility] skilled [facility] for wound care. - [facility] ordered were: Anerobic culture - culture of left foot. 1. I do not think this was a measurement area in error in our clinic from last week to this week. This was simply a vulnerable  boggy area that is open deeply. I do not think this it healed properly 2. I am going to continue use silver alginate 3. I did a culture of the area because of the possibility that this was underlying infection and/or fluid underneath. He is on Zyvox and rifampin for the underlying osteomyelitis 4. Supposed to start HBO next week on Monday. We will see how this transpired Electronic Signature(s) Signed: 02/26/2019 1:06:39 PM By: [name] MD Entered By: [name] on 02/25/2019 14:54:03 -------------------------------------------------------------------------------- SuperBill Details Patient Name: Date of Service: ANTHEM, FRAZER 02/25/2019 Medical Record [id number] Patient Account Number: [id number] Date of Birth/Sex: Treating RN: Jan 28, 1955 (64 y.o. [name] Primary Care Provider: SYSTEM, PCP Other Clinician: Referring Provider: Treating Provider/Extender:Alexus , [name] in Treatment: 2 Diagnosis Coding ICD-10 Codes Code Description E11.621 Type 2 diabetes mellitus with foot ulcer L97.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 of other part of left foot with fat layer exposed M86.672 Other chronic osteomyelitis, left ankle and foot E11.621 Type 2 diabetes mellitus with foot ulcer Facility Procedures CPT4 Code Description: 66063016 11042 - DEB SUBQ TISSUE 20 SQ CM/< ICD-10 Diagnosis Description L97.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 of other part of  left foot with E11.621 Type 2 diabetes mellitus with foot ulcer Modifier: fat layer ex Quantity: 1 posed Physician Procedures CPT4 Code Description: [id number] 55732 - WC PHYS SUBQ TISS 20 SQ CM ICD-10 Diagnosis Description L97.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 of other part of left foot with E11.621 Type 2 diabetes mellitus with foot ulcer Modifier: fat layer exp Quantity: 1 osed Electronic Signature(s) Signed: 02/26/2019 1:06:39 PM By: [name] MD Entered By: [name] on 02/25/2019 14:54:21

## 2019-02-27 LAB — AEROBIC CULTURE? (SUPERFICIAL SPECIMEN)

## 2019-02-27 LAB — AEROBIC CULTURE W GRAM STAIN (SUPERFICIAL SPECIMEN)

## 2019-03-01 ENCOUNTER — Encounter (HOSPITAL_BASED_OUTPATIENT_CLINIC_OR_DEPARTMENT_OTHER): Payer: Medicaid Other | Admitting: Internal Medicine

## 2019-03-01 ENCOUNTER — Other Ambulatory Visit: Payer: Self-pay

## 2019-03-01 DIAGNOSIS — E11621 Type 2 diabetes mellitus with foot ulcer: Secondary | ICD-10-CM | POA: Diagnosis not present

## 2019-03-01 LAB — GLUCOSE, CAPILLARY
Glucose-Capillary: 174 mg/dL — ABNORMAL HIGH (ref 70–99)
Glucose-Capillary: 87 mg/dL (ref 70–99)

## 2019-03-01 NOTE — Progress Notes (Addendum)
EZIO, WIECK ([id number]) Visit Report for 03/01/2019 SuperBill Details Patient Name: Date of Service: [name], [name] 03/01/2019 Medical Record [id number] Patient Account Number: [id number] Date of Birth/Sex: Treating RN: 1955/06/12 (64 y.o. [name] Primary Care Provider: SYSTEM, PCP Other Clinician: [name] Referring Provider: Treating Provider/Extender:Marnette Perkins, [name] in Treatment: 3 Diagnosis Coding ICD-10 Codes Code Description E11.621 Type 2 diabetes mellitus with foot ulcer L97.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 of other part of left foot with fat layer exposed M86.672 Other chronic osteomyelitis, left ankle and foot E11.621 Type 2 diabetes mellitus with foot ulcer Facility Procedures CPT4 Description Modifier Quantity Code [id number]-Physician attendance and supervision of hyperbaric oxygen therapy, per 1 session ICD-10 Diagnosis Description E11.621 Type 2 diabetes mellitus with foot ulcer Physician Procedures CPT4 Code Description Modifier Quantity [id number] 63785 - WC PHYS HYPERBARIC OXYGEN THERAPY 1 ICD-10 Diagnosis Description E11.621 Type 2 diabetes mellitus with foot ulcer Electronic Signature(s) Signed: 03/02/2019 4:25:10 PM By: [name] EMT/HBOT Signed: 03/02/2019 5:18:57 PM By: [name] MD Previous Signature: 03/01/2019 4:24:03 PM Version By: [name] EMT/HBOT Previous Signature: 03/01/2019 6:16:05 PM Version By: [name] MD Entered By: [name] on 03/02/2019 14:37:53

## 2019-03-01 NOTE — Progress Notes (Signed)
CANDLER, GINSBERG ([id number]) Visit Report for 03/01/2019 Arrival Information Details Patient Name: Date of Service: [name], [name] 03/01/2019 10:00 AM Medical Record [id number] Patient Account Number: [id number] Date of Birth/Sex: Treating RN: Oct 13, 1955 (64 y.o. [name] Primary Care Wilbur Labuda: SYSTEM, PCP Other Clinician: [name] Referring Jazleen Robeck: Treating Leimomi Zervas/Extender:[name], [name] in Treatment: 3 Visit Information History Since Last Visit Added or deleted any medications: No Patient Arrived: Ambulatory Any new allergies or adverse reactions: No Arrival Time: 10:00 Had a fall or experienced change in No Accompanied By: self activities of daily living that may affect Transfer Assistance: None risk of falls: Patient Identification Verified: Yes Signs or symptoms of abuse/neglect since last No Secondary Verification Process Yes visito Completed: Hospitalized since last visit: No Patient Requires Transmission-Based No Implantable device outside of the clinic excluding No Precautions: cellular tissue based products placed in the center Patient Has Alerts: No since last visit: Pain Present Now: No Electronic Signature(s) Signed: 03/01/2019 4:24:03 PM By: [name] EMT/HBOT Entered By: [name] on 03/01/2019 11:40:27 -------------------------------------------------------------------------------- Encounter Discharge Information Details Patient Name: Date of Service: [name], [name] 03/01/2019 10:00 AM Medical Record [id number] Patient Account Number: [id number] Date of Birth/Sex: Treating RN: May 23, 1955 (64 y.o. [name] Primary Care Gayatri Teasdale: SYSTEM, PCP Other Clinician: [name] Referring Kannan Proia: Treating Jule Schlabach/Extender:[name], [name] in Treatment: 3 Encounter Discharge Information Items Discharge Condition: Stable Ambulatory Status: Ambulatory Discharge Destination: Home Transportation:  Private Auto Accompanied By: self Schedule Follow-up Appointment: Yes Clinical Summary of Care: Patient Declined Electronic Signature(s) Signed: 03/01/2019 4:24:03 PM By: [name] EMT/HBOT Entered By: [name] on 03/01/2019 13:37:36 -------------------------------------------------------------------------------- Patient/Caregiver Education Details Patient Name: [name] 2/15/2021andnbsp10:00 Date of Service: AM Medical Record [id number] Number: Patient Account Number: [id number] Treating RN: 1955-01-30 (64 y.o. Levan Hurst Date of Birth/Gender: M) Other Clinician: [name] Primary Care Treating SYSTEM, PCP [name] Physician: Physician/Extender: Referring Physician: [name] in Treatment: 3 Education Assessment Education Provided To: Patient Education Topics Provided Hyperbaric Oxygenation: Methods: Explain/Verbal Responses: State content correctly Electronic Signature(s) Signed: 03/01/2019 4:24:03 PM By: [name] EMT/HBOT Entered By: [name] on 03/01/2019 13:37:21 -------------------------------------------------------------------------------- Vitals Details Patient Name: Date of Service: [name], [name] 03/01/2019 10:00 AM Medical Record [id number] Patient Account Number: [id number] Date of Birth/Sex: Treating RN: [birth year] (64 y.o. [name] Primary Care Manuelita Moxon: SYSTEM, PCP Other Clinician: [name] Referring Valgene Deloatch: Treating Shakiera Edelson/Extender:[name], [name] in Treatment: 3 Vital Signs Time Taken: 10:05 Temperature (F): 98.5 Height (in): 74 Pulse (bpm): 90 Weight (lbs): 238 Respiratory Rate (breaths/min): 18 Body Mass Index (BMI): 30.6 Blood Pressure (mmHg): 111/82 Capillary Blood Glucose (mg/dl): 174 Reference Range: 80 - 120 mg / dl Electronic Signature(s) Signed: 03/01/2019 4:24:03 PM By: [name] EMT/HBOT Entered By: [name] on 03/01/2019 11:40:46

## 2019-03-01 NOTE — Progress Notes (Addendum)
[name], CLEAR ([id number]) Visit Report for 03/01/2019 HBO Details Patient Name: Date of Service: CRU, KRITIKOS 03/01/2019 10:00 AM Medical Record [name] Patient Account Number: [id number] Date of Birth/Sex: Treating RN: 11-07-1955 (64 y.o. [name] Primary Care Jacqui Headen: SYSTEM, PCP Other Clinician: [name] Referring Davielle Lingelbach: Treating Skiler Olden/Extender:[name], [name] in Treatment: 3 HBO Treatment Course Details Treatment Course Number: 1 Ordering Tyton Abdallah: [name] Total Treatments Ordered: 40 HBO Treatment Start Date: 03/01/2019 HBO Indication: Diabetic Ulcer(s) of the Lower Extremity HBO Treatment Details Treatment Number: 1 Patient Type: Outpatient Chamber Type: Monoplace Chamber Serial #: [id number] Treatment Protocol: 2.5 ATA with 90 minutes oxygen, with two 5 minute air breaks Treatment Details Compression Rate Down: 1.5 psi / minute De-Compression Rate Up: 2.0 psi / minute Air breaks and CompressTx Pressure breathing periods DecompressDecompress Begins Reached (leave unused spaces Begins Ends blank) Chamber Pressure (ATA)1 2.5 2.5 2.5 2.5 2.5 --2.5 1 Clock Time (24 hr) 10:38 10:53 11:2311:2811:5812:03--12:33 12:45 Treatment Length: 127 (minutes) Treatment Segments: 4 Vital Signs Capillary Blood Glucose Reference Range: 80 - 120 mg / dl HBO Diabetic Blood Glucose Intervention Range: <131 mg/dl or >249 mg/dl Time Vitals Blood Respiratory Capillary Blood Glucose Pulse Action Type: Pulse: Temperature: Taken: Pressure: Rate: Glucose (mg/dl): Meter #: Oximetry (%) Taken: Pre 10:05 111/82 90 18 98.5 174 Post 12:47 127/76 70 15 98 87 Treatment Response Treatment Toleration: Well Treatment Completion Treatment Completed without Adverse Event Status: Additional Procedure Documentation Tissue Sevierity: Necrosis of bone [name] Notes Patient here for his first hyperbaric treatment. Posttreatment I learned that he had vomited  before he went in the tank he was somewhat drowsy but blood sugars were in the 170s. He seemed to tolerate the treatment well and then left the clinic without obvious problems. I will check with him again tomorrow Physician HBO Attestation: I certify that I supervised this HBO treatment in accordance with Medicare guidelines. A trained Yes emergency response team is readily available per hospital policies and procedures. Continue HBOT as ordered. Yes Electronic Signature(s) Signed: 03/01/2019 6:16:05 PM By: [name] MD Previous Signature: 03/01/2019 4:24:03 PM Version By: [name] EMT/HBOT Entered By: [name] on 03/01/2019 18:15:19 -------------------------------------------------------------------------------- HBO Safety Checklist Details Patient Name: Date of Service: [name], NAREZ 03/01/2019 10:00 AM Medical Record [id number] Patient Account Number: [id number] Date of Birth/Sex: Treating RN: [birth year] (64 y.o. [name] Primary Care Chyla Schlender: SYSTEM, PCP Other Clinician: [name] Referring Jeaneane Adamec: Treating Hai Grabe/Extender:[name], [name] in Treatment: 3 HBO Safety Checklist Items Safety Checklist Consent Form Signed Patient voided / foley secured and emptied When did you last eato 0900 - biscuit Last dose of injectable or oral agent insulin NA Ostomy pouch emptied and vented if applicable NA All implantable devices assessed, documented and approved NA Intravenous access site secured and place Valuables secured Linens and cotton and cotton/polyester blend (less than 51% polyester) Personal oil-based products / skin lotions / body lotions removed NA Wigs or hairpieces removed NA Smoking or tobacco materials removed Books / newspapers / magazines / loose paper removed Cologne, aftershave, perfume and deodorant removed Jewelry removed (may wrap wedding band) NA Make-up removed Hair care products removed NA Battery operated  devices (external) removed NA Heating patches and chemical warmers removed NA Titanium eyewear removed NA Nail polish cured greater than 10 hours NA Casting material cured greater than 10 hours NA Hearing aids removed NA Loose dentures or partials removed NA Prosthetics have been removed Patient demonstrates correct use of air break device (if applicable) Patient concerns have been  addressed Patient grounding bracelet on and cord attached to chamber Specifics for Inpatients (complete in addition to above) Medication sheet sent with patient Intravenous medications needed or due during therapy sent with patient Drainage tubes (e.g. nasogastric tube or chest tube secured and vented) Endotracheal or Tracheotomy tube secured Cuff deflated of air and inflated with saline Airway suctioned Electronic Signature(s) Signed: 03/01/2019 11:41:51 AM By: [name] EMT/HBOT Entered By: [name] on 03/01/2019 11:41:50

## 2019-03-02 ENCOUNTER — Encounter (HOSPITAL_BASED_OUTPATIENT_CLINIC_OR_DEPARTMENT_OTHER): Payer: Medicaid Other | Admitting: Internal Medicine

## 2019-03-02 DIAGNOSIS — E11621 Type 2 diabetes mellitus with foot ulcer: Secondary | ICD-10-CM | POA: Diagnosis not present

## 2019-03-02 LAB — GLUCOSE, CAPILLARY
Glucose-Capillary: 237 mg/dL — ABNORMAL HIGH (ref 70–99)
Glucose-Capillary: 160 mg/dL — ABNORMAL HIGH (ref 70–99)

## 2019-03-02 NOTE — Progress Notes (Signed)
RUFFIN, LADA ([id number]) Visit Report for 03/02/2019 SuperBill Details Patient Name: Date of Service: [name], [name] 03/02/2019 Medical Record [id number] Patient Account Number: [id number] Date of Birth/Sex: Treating RN: 04-04-1955 (63 y.o. 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, left ankle and foot E11.621 Type 2 diabetes mellitus with foot ulcer Facility Procedures CPT4 Description Modifier Quantity Code 66060045 [id number]-Physician attendance and supervision of hyperbaric oxygen therapy, per 1 session ICD-10 Diagnosis Description E11.621 Type 2 diabetes mellitus with foot ulcer Physician Procedures CPT4 Code Description Modifier Quantity 9977414 23953 - WC PHYS HYPERBARIC OXYGEN THERAPY 1 ICD-10 Diagnosis Description E11.621 Type 2 diabetes mellitus with foot ulcer Electronic Signature(s) Signed: 03/02/2019 4:25:10 PM By: [name] EMT/HBOT Signed: 03/02/2019 5:18:57 PM By: [name] MD Entered By: [name] on 03/02/2019 14:39:59

## 2019-03-02 NOTE — Progress Notes (Signed)
CHASKA, HAGGER ([id number]) Visit Report for 03/02/2019 Arrival Information Details Patient Name: Date of Service: [name], [name] 03/02/2019 10:00 AM Medical Record [id number] Patient Account Number: [id number] Date of Birth/Sex: Treating RN: 12/17/1955 (63 y.o. [name]) Carlene Coria Primary Care Robyne Matar: SYSTEM, PCP Other Clinician: [name] Referring Jurgen Groeneveld: Treating Digna Countess/Extender:[name], [name] in Treatment: 3 Visit Information History Since Last Visit Added or deleted any medications: No Patient Arrived: Ambulatory Any new allergies or adverse reactions: No Arrival Time: 09:30 Had a fall or experienced change in No Accompanied By: self activities of daily living that may affect Transfer Assistance: None risk of falls: Patient Identification Verified: Yes Signs or symptoms of abuse/neglect since last No Secondary Verification Process Yes visito Completed: Hospitalized since last visit: No Patient Requires Transmission-Based No Implantable device outside of the clinic excluding No Precautions: cellular tissue based products placed in the center Patient Has Alerts: No since last visit: Pain Present Now: No Electronic Signature(s) Signed: 03/02/2019 4:25:10 PM By: [name] EMT/HBOT Entered By: [name] on 03/02/2019 12:09:56 -------------------------------------------------------------------------------- Encounter Discharge Information Details Patient Name: Date of Service: [name], [name] 03/02/2019 10:00 AM Medical Record [id number] Patient Account Number: [id number] Date of Birth/Sex: Treating RN: [birth year] (63 y.o. [name]) Carlene Coria Primary Care Rosey Eide: SYSTEM, PCP Other Clinician: [name] Referring Pascale Maves: Treating Caetano Oberhaus/Extender:[name], [name] in Treatment: 3 Encounter Discharge Information Items Discharge Condition: Stable Ambulatory Status: Ambulatory Discharge Destination: Home Transportation:  Private Auto Accompanied By: self Schedule Follow-up Appointment: Yes Clinical Summary of Care: Patient Declined Electronic Signature(s) Signed: 03/02/2019 4:25:10 PM By: [name] EMT/HBOT Entered By: [name] on 03/02/2019 13:49:54 -------------------------------------------------------------------------------- Patient/Caregiver Education Details Patient Name: [name] 2/16/2021andnbsp10:00 Date of Service: AM Medical Record [id number] Number: Patient Account Number: [id number] Treating RN: [birth year] (64 y.o. Carlene Coria Date of Birth/Gender: M) Other Clinician: [name] Primary Care Treating SYSTEM, PCP [name] Physician: Physician/Extender: Referring Physician: [name] in Treatment: 3 Education Assessment Education Provided To: Patient Education Topics Provided Hyperbaric Oxygenation: Methods: Explain/Verbal Responses: State content correctly Electronic Signature(s) Signed: 03/02/2019 4:25:10 PM By: [name] EMT/HBOT Entered By: [name] on 03/02/2019 13:48:58 -------------------------------------------------------------------------------- Vitals Details Patient Name: Date of Service: [name], [name] 03/02/2019 10:00 AM Medical Record [id number] Patient Account Number: [id number] Date of Birth/Sex: Treating RN: 05/17/1955 (63 y.o. [name]) Carlene Coria Primary Care Jonita Hirota: SYSTEM, PCP Other Clinician: [name] Referring Riah Kehoe: Treating Remmington Urieta/Extender:[name], [name] in Treatment: 3 Vital Signs Time Taken: 09:35 Temperature (F): 98.2 Height (in): 74 Pulse (bpm): 66 Weight (lbs): 238 Respiratory Rate (breaths/min): 16 Body Mass Index (BMI): 30.6 Blood Pressure (mmHg): 103/55 Capillary Blood Glucose (mg/dl): 237 Reference Range: 80 - 120 mg / dl Electronic Signature(s) Signed: 03/02/2019 4:25:10 PM By: [name] EMT/HBOT Entered By: [name] on 03/02/2019 12:10:23

## 2019-03-02 NOTE — Progress Notes (Addendum)
[name] ([id number]) Visit Report for 03/02/2019 HBO Details Patient Name: Date of Service: [name], [name] 03/02/2019 10:00 AM Medical Record [id number] Patient Account Number: [id number] Date of Birth/Sex: Treating RN: 11/01/1955 (63 y.o. [name]) [name] Primary Care [name]: SYSTEM, PCP Other Clinician: [name] [name] Referring [name]: Treating [name]/Extender:[name] in Treatment: 3 HBO Treatment Course Details Treatment Course Number: 1 Ordering [name]: [name] Total Treatments Ordered: 40 HBO Treatment Start Date: 03/01/2019 HBO Indication: Diabetic Ulcer(s) of the Lower Extremity HBO Treatment Details Treatment Number: 2 Patient Type: Outpatient Chamber Type: Monoplace Chamber Serial #: [id number] Treatment Protocol: 2.5 ATA with 90 minutes oxygen, with two 5 minute air breaks Treatment Details Compression Rate Down: 2.0 psi / minute De-Compression Rate Up: 2.0 psi / minute Air breaks and CompressTx Pressure breathing periods DecompressDecompress Begins Reached (leave unused spaces Begins Ends blank) Chamber Pressure (ATA)1 2.5 2.5 2.5 2.5 2.5 --2.5 1 Clock Time (24 hr) 09:48 10:00 10:3010:3511:0511:10--11:40 11:52 Treatment Length: 124 (minutes) Treatment Segments: 4 Vital Signs Capillary Blood Glucose Reference Range: 80 - 120 mg / dl HBO Diabetic Blood Glucose Intervention Range: <131 mg/dl or >249 mg/dl Time Vitals Blood Respiratory Capillary Blood Glucose Pulse Action Type: Pulse: Temperature: Taken: Pressure: Rate: Glucose (mg/dl): Meter #: Oximetry (%) Taken: Pre 09:35 103/55 66 16 98.2 237 Post 11:55 137/75 54 16 98.1 160 Treatment Response Treatment Toleration: Well Treatment Completion Treatment Completed without Adverse Event Status: Additional Procedure Documentation Tissue [name]: Necrosis of bone [name] Notes No concerns with treatment given. I talked to him about his most recent culture result which  showed Enterobacter. I prescribed ciprofloxacin 500 twice daily and E scribed this into his pharmacy. 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 A trained Yes emergency response team is readily available per hospital policies and procedures. Continue HBOT as ordered. Yes Electronic Signature(s) Signed: 03/02/2019 5:18:57 PM By: Darrell Ham MD Previous Signature: 03/02/2019 4:25:10 PM Version By: [name] EMT/HBOT Entered By: [name] on 03/02/2019 17:05:19 -------------------------------------------------------------------------------- HBO Safety Checklist Details Patient Name: Date of Service: [name], [name] 03/02/2019 10:00 AM Medical Record [id number] Patient Account Number: [id number] Date of Birth/Sex: Treating RN: 1955/04/01 (63 y.o. [name]) [name] Primary Care Shaunette Gassner: SYSTEM, PCP Other Clinician: [name] [name] Referring Hamda Klutts: Treating Charlann Wayne/Extender:[name] in Treatment: 3 HBO Safety Checklist Items Safety Checklist Consent Form Signed Patient voided / foley secured and emptied When did you last eato n/a Last dose of injectable or oral agent insulin NA Ostomy pouch emptied and vented if applicable NA All implantable devices assessed, documented and approved NA Intravenous access site secured and place Valuables secured Linens and cotton and cotton/polyester blend (less than 51% polyester) Personal oil-based products / skin lotions / body lotions removed NA Wigs or hairpieces removed NA Smoking or tobacco materials removed Books / newspapers / magazines / loose paper removed Cologne, aftershave, perfume and deodorant removed Jewelry removed (may wrap wedding band) NA Make-up removed Hair care products removed NA  Battery operated devices (external) removed NA Heating patches and chemical warmers removed NA Titanium eyewear removed NA Nail polish cured greater than 10 hours NA Casting material cured greater than 10 hours NA Hearing aids removed NA Loose dentures or partials removed NA Prosthetics have been removed Patient demonstrates correct use of air break device (if applicable) Patient concerns have been addressed  Patient grounding bracelet on and cord attached to chamber Specifics for Inpatients (complete in addition to above) Medication sheet sent with patient Intravenous medications needed or due during therapy sent with patient Drainage tubes (e.g. nasogastric tube or chest tube secured and vented) Endotracheal or Tracheotomy tube secured Cuff deflated of air and inflated with saline Airway suctioned Electronic Signature(s) Signed: 03/02/2019 12:11:06 PM By: [name] EMT/HBOT Entered By: [name] on 03/02/2019 12:11:06

## 2019-03-03 ENCOUNTER — Encounter (HOSPITAL_BASED_OUTPATIENT_CLINIC_OR_DEPARTMENT_OTHER): Payer: Medicaid Other | Admitting: Physician Assistant

## 2019-03-03 ENCOUNTER — Other Ambulatory Visit: Payer: Self-pay

## 2019-03-03 DIAGNOSIS — E11621 Type 2 diabetes mellitus with foot ulcer: Secondary | ICD-10-CM | POA: Diagnosis not present

## 2019-03-03 LAB — GLUCOSE, CAPILLARY
Glucose-Capillary: 145 mg/dL — ABNORMAL HIGH (ref 70–99)
Glucose-Capillary: 105 mg/dL — ABNORMAL HIGH (ref 70–99)

## 2019-03-03 NOTE — Progress Notes (Signed)
[name], [name] ([id number]) Visit Report for 03/03/2019 Arrival Information Details Patient Name: Date of Service: [name], [name] 03/03/2019 10:00 AM Medical Record [id number] Patient Account Number: [id number] Date of Birth/Sex: Treating RN: 1955-03-24 (64 y.o. [name] Primary Care Kass Herberger: SYSTEM, PCP Other Clinician: [name] Referring [place]: Treating Drayke Grabel/Extender:Stone III, Vance Gather in Treatment: 3 Visit Information History Since Last Visit Added or deleted any medications: No Patient Arrived: Ambulatory Any new allergies or adverse reactions: No Arrival Time: 09:05 Had a fall or experienced change in No Accompanied By: self activities of daily living that may affect Transfer Assistance: None risk of falls: Patient Identification Verified: Yes Signs or symptoms of abuse/neglect since last No Secondary Verification Process Yes visito Completed: Hospitalized since last visit: No Patient Requires Transmission-Based No Implantable device outside of the clinic excluding No Precautions: cellular tissue based products placed in the center Patient Has Alerts: No since last visit: Pain Present Now: No Electronic Signature(s) Signed: 03/03/2019 4:27:12 PM By: [name] EMT/HBOT Entered By: [name] on 03/03/2019 11:19:19 -------------------------------------------------------------------------------- Encounter Discharge Information Details Patient Name: Date of Service: [name], [name] 03/03/2019 10:00 AM Medical Record [id number] Patient Account Number: [id number] Date of Birth/Sex: Treating RN: 03-23-1955 (64 y.o. [name] Primary Care [name]: SYSTEM, PCP Other Clinician: [name] Referring Takoya Jonas: Treating Avory Rahimi/Extender:Stone III, Vance Gather in Treatment: 3 Encounter Discharge Information Items Discharge Condition: Stable Ambulatory Status: Ambulatory Discharge Destination: Home Transportation:  Private Auto Accompanied By: self Schedule Follow-up Appointment: Yes Clinical Summary of Care: Patient Declined Electronic Signature(s) Signed: 03/03/2019 4:27:12 PM By: [name] EMT/HBOT Entered By: [name] on 03/03/2019 11:57:39 -------------------------------------------------------------------------------- Patient/Caregiver Education Details Patient Name: [name] 2/17/2021andnbsp10:00 Date of Service: AM Medical Record [id number] Number: Patient Account Number: [id number] Treating RN: Oct 04, 1955 (64 y.o. [name] Date of Birth/Gender: M) Other Clinician: [name] Primary Care Treating SYSTEM, PCP Worthy Keeler Physician: Physician/Extender: Referring Physician: [name] in Treatment: 3 Education Assessment Education Provided To: Patient Education Topics Provided Hyperbaric Oxygenation: Methods: Explain/Verbal Responses: State content correctly Electronic Signature(s) Signed: 03/03/2019 4:27:12 PM By: [name] EMT/HBOT Entered By: [name] on 03/03/2019 11:57:24 -------------------------------------------------------------------------------- Vitals Details Patient Name: Date of Service: [name], [name] 03/03/2019 10:00 AM Medical Record [id number] Patient Account Number: [id number] Date of Birth/Sex: Treating RN: 1955-03-07 (64 y.o. [name] Primary Care Roselie Cirigliano: SYSTEM, PCP Other Clinician: [name] Referring Sadiyah Kangas: Treating Krithi Bray/Extender:Stone III, Vance Gather in Treatment: 3 Vital Signs Time Taken: 09:10 Temperature (F): 98.7 Height (in): 74 Pulse (bpm): 79 Weight (lbs): 238 Respiratory Rate (breaths/min): 17 Body Mass Index (BMI): 30.6 Blood Pressure (mmHg): 140/56 Capillary Blood Glucose (mg/dl): 145 Reference Range: 80 - 120 mg / dl Electronic Signature(s) Signed: 03/03/2019 4:27:12 PM By: [name] EMT/HBOT Entered By: [name] on 03/03/2019 11:19:35

## 2019-03-03 NOTE — Progress Notes (Addendum)
[name] ([id number]) Visit Report for 03/03/2019 HBO Details Patient Name: Date of Service: [name], [name] 03/03/2019 10:00 AM Medical Record [id number] Patient Account Number: [id number] Date of Birth/Sex: Treating RN: 21-Jul-1955 (64 y.o. [name] Primary Care [name]: SYSTEM, PCP Other Clinician: [name] [name] Referring [name]: Treating [name]:[name], [name] in Treatment: 3 HBO Treatment Course Details Treatment Course Number: 1 Ordering [name]: [name] Total Treatments Ordered: 40 HBO Treatment Start Date: 03/01/2019 HBO Indication: Diabetic Ulcer(s) of the Lower Extremity HBO Treatment Details Treatment Number: 3 Patient Type: Outpatient Chamber Type: Monoplace Chamber Serial #: [id number] Treatment Protocol: 2.5 ATA with 90 minutes oxygen, with two 5 minute air breaks Treatment Details Compression Rate Down: 2.0 psi / minute De-Compression Rate Up: 2.0 psi / minute Air breaks and CompressTx Pressure breathing periods DecompressDecompress Begins Reached (leave unused spaces Begins Ends blank) Chamber Pressure (ATA)1 2.5 2.5 2.5 2.5 2.5 --2.5 1 Clock Time (24 hr) 09:23 09:35 10:0510:1010:4010:45--11:15 11:27 Treatment Length: 124 (minutes) Treatment Segments: 4 Vital Signs Capillary Blood Glucose Reference Range: 80 - 120 mg / dl HBO Diabetic Blood Glucose Intervention Range: <131 mg/dl or >249 mg/dl Time Vitals Blood Respiratory Capillary Blood Glucose Pulse Action Type: Pulse: Temperature: Taken: Pressure: Rate: Glucose (mg/dl): Meter #: Oximetry (%) Taken: Pre 09:10 140/56 79 17 98.7 145 Post 11:30 120/99 70 15 98.4 105 Treatment Response Treatment Toleration: Well Treatment Completion Treatment Completed without Adverse Event Status: Additional Procedure Documentation Tissue Sevierity: Necrosis of bone Physician HBO Attestation: I certify that I supervised this HBO treatment in accordance with Medicare  guidelines. A trained Yes Yes emergency response team is readily available per hospital policies and procedures. Continue HBOT as ordered. Yes Electronic Signature(s) Signed: 03/04/2019 12:37:21 PM By: Darrell Keeler PA-C Previous Signature: 03/03/2019 4:27:12 PM Version By: [name] EMT/HBOT Entered By: [name] on 03/04/2019 12:19:01 -------------------------------------------------------------------------------- HBO Safety Checklist Details Patient Name: Date of Service: [name], [name] 03/03/2019 10:00 AM Medical Record [id number] Patient Account Number: [id number] Date of Birth/Sex: Treating RN: 01-19-1955 (64 y.o. [name] Primary Care [name]: SYSTEM, PCP Other Clinician: [name] [name] Referring [name]: Treating [name]/Extender:[name], [name] in Treatment: 3 HBO Safety Checklist Items Safety Checklist Consent Form Signed Patient voided / foley secured and emptied When did you last eato 0700 - bacon sammy Last dose of injectable or oral agent insulin NA Ostomy pouch emptied and vented if applicable NA All implantable devices assessed, documented and approved NA Intravenous access site secured and place Valuables secured Linens and cotton and cotton/polyester blend (less than 51% polyester) Personal oil-based products / skin lotions / body lotions removed NA Wigs or hairpieces removed NA Smoking or tobacco materials removed Books / newspapers / magazines / loose paper removed Cologne, aftershave, perfume and deodorant removed Jewelry removed (may wrap wedding band) NA Make-up removed Hair care products removed NA Battery operated devices (external) removed NA Heating patches and chemical warmers removed NA Titanium eyewear removed NA Nail polish cured greater than 10 hours NA Casting material cured greater than 10 hours NA Hearing aids removed NA Loose dentures or partials removed NA Prosthetics have been removed Patient  demonstrates correct use of air break device (if applicable) Patient concerns have been addressed Patient grounding bracelet on and cord attached to chamber Specifics for Inpatients (complete in addition to above) Medication sheet sent with patient Intravenous medications needed or due during therapy sent with patient Drainage tubes (e.g. nasogastric tube or chest tube secured and vented) Endotracheal or Tracheotomy tube secured  Cuff deflated of air and inflated with saline Airway suctioned Electronic Signature(s) Signed: 03/03/2019 11:20:41 AM By: [name] EMT/HBOT Entered By: [name] on 03/03/2019 11:20:41

## 2019-03-04 ENCOUNTER — Encounter (HOSPITAL_BASED_OUTPATIENT_CLINIC_OR_DEPARTMENT_OTHER): Payer: Medicaid Other | Admitting: Internal Medicine

## 2019-03-04 NOTE — Progress Notes (Addendum)
ANDERSSON, LARRABEE ([id number]) Visit Report for 03/03/2019 Problem List Details Patient Name: Date of Service: [name], [name] 03/03/2019 10:00 AM Medical Record [id number] Patient Account Number: [id number] Date of Birth/Sex: Treating RN: Sep 03, 1955 (64 y.o. 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 [name] Primary Care Provider: SYSTEM, PCP Other Clinician: [name] Referring Provider: Treating Provider/Extender:Stone III, Vance Gather in Treatment: 3 Diagnosis Coding ICD-10 Codes Code Description E11.621 Type 2 diabetes mellitus with foot ulcer L97.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 of other part of left foot with fat layer exposed M86.672 Other chronic osteomyelitis, left ankle and foot E11.621 Type 2 diabetes mellitus with foot ulcer Facility Procedures CPT4: Description Modifier Quantity Code [id number] 99183-Physician attendance and supervision of hyperbaric oxygen therapy, per 1 session ICD-10 Diagnosis Description  M86.672 Other chronic osteomyelitis, left ankle and foot Physician Procedures CPT4 Code Description: 3953202 [id number] - WC PHYS HYPERBARIC OXYGEN THERAPY ICD-10 Diagnosis Description M86.672 Other chronic osteomyelitis, left ankle and foot Modifier: Quantity: 1 Electronic Signature(s) Signed: 03/05/2019 5:06:31 PM By: [name] EMT/HBOT Signed: 03/24/2019 5:29:37 PM By: Worthy Keeler PA-C Previous Signature: 03/04/2019 12:37:21 PM Version By: Worthy Keeler PA-C Previous Signature: 03/03/2019 4:27:12 PM Version By: [name] EMT/HBOT Entered By: [name] on 03/05/2019 11:52:55

## 2019-03-05 ENCOUNTER — Encounter (HOSPITAL_BASED_OUTPATIENT_CLINIC_OR_DEPARTMENT_OTHER): Payer: Medicaid Other | Admitting: Internal Medicine

## 2019-03-05 ENCOUNTER — Other Ambulatory Visit: Payer: Self-pay

## 2019-03-05 DIAGNOSIS — E11621 Type 2 diabetes mellitus with foot ulcer: Secondary | ICD-10-CM | POA: Diagnosis not present

## 2019-03-05 LAB — GLUCOSE, CAPILLARY
Glucose-Capillary: 170 mg/dL — ABNORMAL HIGH (ref 70–99)
Glucose-Capillary: 140 mg/dL — ABNORMAL HIGH (ref 70–99)

## 2019-03-05 NOTE — Progress Notes (Signed)
[name], [name] ([id number]) Visit Report for 03/05/2019 SuperBill Details Patient Name: Date of Service: [name], [name] 03/05/2019 Medical Record [id number] Patient Account Number: [id number] Date of Birth/Sex: Treating RN: 1955/08/08 (64 y.o. [name] Primary Care Provider: SYSTEM, PCP Other Clinician: [name] Referring Provider: Treating Provider/Extender:Skipper Dacosta, [name] in Treatment: 3 Diagnosis Coding ICD-10 Codes Code Description E11.621 Type 2 diabetes mellitus with foot ulcer L97.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 of other part of left foot with fat layer exposed M86.672 Other chronic osteomyelitis, left ankle and foot E11.621 Type 2 diabetes mellitus with foot ulcer Facility Procedures CPT4 Description Modifier Quantity Code [id number] 99183-Physician attendance and supervision of hyperbaric oxygen therapy, per 1 session ICD-10 Diagnosis Description E11.621 Type 2 diabetes mellitus with foot ulcer Physician Procedures CPT4 Code Description Modifier Quantity [id number] 47654 - WC PHYS HYPERBARIC OXYGEN THERAPY 1 ICD-10 Diagnosis Description E11.621 Type 2 diabetes mellitus with foot ulcer Electronic Signature(s) Signed: 03/05/2019 5:06:31 PM By: [name] EMT/HBOT Signed: 03/05/2019 6:29:59 PM By: [name] MD Entered By: [name] on 03/05/2019 11:51:21

## 2019-03-05 NOTE — Progress Notes (Signed)
ADVITH, MARTINE ([id number]) Visit Report for 03/05/2019 Arrival Information Details Patient Name: Date of Service: [name], [name] 03/05/2019 10:00 AM Medical Record [id number] Patient Account Number: [id number] Date of Birth/Sex: Treating RN: 1955/03/05 (64 y.o. [name] Primary Care Kanaya Gunnarson: SYSTEM, PCP Other Clinician: [name] Referring Chantilly Linskey: Treating Joniece Smotherman/Extender:[name], [name] in Treatment: 3 Visit Information History Since Last Visit Added or deleted any medications: No Patient Arrived: Ambulatory Any new allergies or adverse reactions: No Arrival Time: 09:05 Had a fall or experienced change in No Accompanied By: self activities of daily living that may affect Transfer Assistance: None risk of falls: Patient Identification Verified: Yes Signs or symptoms of abuse/neglect since last No Secondary Verification Process Yes visito Completed: Hospitalized since last visit: No Patient Requires Transmission-Based No Implantable device outside of the clinic excluding No Precautions: cellular tissue based products placed in the center Patient Has Alerts: No since last visit: Pain Present Now: No Electronic Signature(s) Signed: 03/05/2019 5:06:31 PM By: [name] EMT/HBOT Entered By: [name] on 03/05/2019 10:34:22 -------------------------------------------------------------------------------- Encounter Discharge Information Details Patient Name: Date of Service: [name], [name] 03/05/2019 10:00 AM Medical Record [id number] Patient Account Number: [id number] Date of Birth/Sex: Treating RN: May 28, 1955 (64 y.o. [name] Primary Care Lynna Zamorano: SYSTEM, PCP Other Clinician: [name] Referring Zaccai Chavarin: Treating Chanci Ojala/Extender:[name], [name] in Treatment: 3 Encounter Discharge Information Items Discharge Condition: Stable Ambulatory Status: Ambulatory Discharge Destination:  Home Transportation: Private Auto Accompanied By: self Schedule Follow-up Appointment: Yes Clinical Summary of Care: Patient Declined Electronic Signature(s) Signed: 03/05/2019 5:06:31 PM By: [name] EMT/HBOT Entered By: [name] on 03/05/2019 11:51:51 -------------------------------------------------------------------------------- Patient/Caregiver Education Details Patient Name: [name] 2/19/2021andnbsp10:00 Date of Service: AM Medical Record [id number] Number: Patient Account Number: [id number] Treating RN: 06/26/1955 (63 y.o. Kela Millin Date of Birth/Gender: M) Other Clinician: [name] Primary Care Treating SYSTEM, PCP [name] Physician: Physician/Extender: Referring Physician: [name] in Treatment: 3 Education Assessment Education Provided To: Patient Education Topics Provided Hyperbaric Oxygenation: Methods: Explain/Verbal Responses: State content correctly Electronic Signature(s) Signed: 03/05/2019 5:06:31 PM By: [name] EMT/HBOT Entered By: [name] on 03/05/2019 11:51:38 -------------------------------------------------------------------------------- Vitals Details Patient Name: Date of Service: [name], [name] 03/05/2019 10:00 AM Medical Record [id number] Patient Account Number: [id number] Date of Birth/Sex: Treating RN: 01-Jun-1955 (64 y.o. [name] Primary Care Sarahy Creedon: SYSTEM, PCP Other Clinician: [name] Referring Meha Vidrine: Treating Timonthy Hovater/Extender:[name], [name] in Treatment: 3 Vital Signs Time Taken: 09:10 Temperature (F): 98.6 Height (in): 74 Pulse (bpm): 100 Weight (lbs): 238 Respiratory Rate (breaths/min): 18 Body Mass Index (BMI): 30.6 Blood Pressure (mmHg): 171/79 Capillary Blood Glucose (mg/dl): 170 Reference Range: 80 - 120 mg / dl Electronic Signature(s) Signed: 03/05/2019 5:06:31 PM By: [name] EMT/HBOT Entered By: [name] on  03/05/2019 10:34:44

## 2019-03-05 NOTE — Progress Notes (Addendum)
Darrell, [name] ([id number]) Visit Report for 03/05/2019 HBO Details Patient Name: Date of Service: Darrell, [name] 03/05/2019 10:00 AM Medical Record [id number] Patient Account Number: [id number] Date of Birth/Sex: Treating RN: 10-27-1955 (64 y.o. [name] Primary Care Shemeca Lukasik: SYSTEM, PCP Other Clinician: [name] Referring Melani Brisbane: Treating Narely Nobles/Extender:[name], [name] in Treatment: 3 HBO Treatment Course Details Treatment Course Number: 1 Ordering Cleburn Maiolo: [name] Total Treatments Ordered: 40 HBO Treatment Start Date: 03/01/2019 HBO Indication: Diabetic Ulcer(s) of the Lower Extremity HBO Treatment Details Treatment Number: 4 Patient Type: Outpatient Chamber Type: Monoplace Chamber Serial #: [id number] Treatment Protocol: 2.5 ATA with 90 minutes oxygen, with two 5 minute air breaks Treatment Details Compression Rate Down: 2.0 psi / minute De-Compression Rate Up: 2.0 psi / minute Air breaks and CompressTx Pressure breathing periods DecompressDecompress Begins Reached (leave unused spaces Begins Ends blank) Chamber Pressure (ATA)1 2.5 2.5 2.5 2.5 2.5 --2.5 1 Clock Time (24 hr) 09:22 09:34 06:3016:0109:3235:57--32:20 11:26 Treatment Length: 124 (minutes) Treatment Segments: 4 Vital Signs Capillary Blood Glucose Reference Range: 80 - 120 mg / dl HBO Diabetic Blood Glucose Intervention Range: <131 mg/dl or >249 mg/dl Time Vitals Blood Respiratory Capillary Blood Glucose Pulse Action Type: Pulse: Temperature: Taken: Pressure: Rate: Glucose (mg/dl): Meter #: Oximetry (%) Taken: Pre 09:10 171/79 100 18 98.6 170 Post 11:28 182/83 71 15 98.2 140 Treatment Response Treatment Toleration: Well Treatment Completion Treatment Completed without Adverse Event Status: Additional Procedure Documentation Tissue Sevierity: Necrosis of bone Kert Shackett Notes No concerns with treatment given Physician HBO Attestation: I certify that I  supervised this HBO treatment in accordance with Medicare guidelines. A trained Yes emergency response team is readily available per hospital policies and procedures. Continue HBOT as ordered. Yes Electronic Signature(s) Signed: 03/05/2019 6:29:59 PM By: [name] MD Previous Signature: 03/05/2019 5:06:31 PM Version By: [name] EMT/HBOT Entered By: [name] on 03/05/2019 18:23:28 -------------------------------------------------------------------------------- HBO Safety Checklist Details Patient Name: Date of Service: [name], [name] 03/05/2019 10:00 AM Medical Record [id number] Patient Account Number: [id number] Date of Birth/Sex: Treating RN: [birth year] (64 y.o. [name] Primary Care Cormac Wint: SYSTEM, PCP Other Clinician: [name] Referring Beulah Capobianco: Treating Kizer Nobbe/Extender:[name], [name] in Treatment: 3 HBO Safety Checklist Items Safety Checklist Consent Form Signed Patient voided / foley secured and emptied When did you last eato 0800 - Bacon sammy Last dose of injectable or oral agent insulin NA Ostomy pouch emptied and vented if applicable NA All implantable devices assessed, documented and approved NA Intravenous access site secured and place Valuables secured Linens and cotton and cotton/polyester blend (less than 51% polyester) Personal oil-based products / skin lotions / body lotions removed NA Wigs or hairpieces removed NA Smoking or tobacco materials removed Books / newspapers / magazines / loose paper removed Cologne, aftershave, perfume and deodorant removed Jewelry removed (may wrap wedding band) NA Make-up removed Hair care products removed NA Battery operated devices (external) removed NA Heating patches and chemical warmers removed NA Titanium eyewear removed NA Nail polish cured greater than 10 hours NA Casting material cured greater than 10 hours NA Hearing aids removed NA Loose dentures or partials  removed NA Prosthetics have been removed Patient demonstrates correct use of air break device (if applicable) Patient concerns have been addressed Patient grounding bracelet on and cord attached to chamber Specifics for Inpatients (complete in addition to above) Medication sheet sent with patient Intravenous medications needed or due during therapy sent with patient Drainage tubes (e.g. nasogastric tube or chest tube secured and vented) Endotracheal or Tracheotomy  tube secured Cuff deflated of air and inflated with saline Airway suctioned Electronic Signature(s) Signed: 03/05/2019 10:35:33 AM By: [name] EMT/HBOT Entered By: [name] on 03/05/2019 10:35:33

## 2019-03-08 ENCOUNTER — Encounter (HOSPITAL_BASED_OUTPATIENT_CLINIC_OR_DEPARTMENT_OTHER): Payer: Self-pay

## 2019-03-08 ENCOUNTER — Other Ambulatory Visit: Payer: Self-pay

## 2019-03-08 ENCOUNTER — Encounter (HOSPITAL_BASED_OUTPATIENT_CLINIC_OR_DEPARTMENT_OTHER): Payer: Medicaid Other | Admitting: Internal Medicine

## 2019-03-08 DIAGNOSIS — E11621 Type 2 diabetes mellitus with foot ulcer: Secondary | ICD-10-CM | POA: Diagnosis not present

## 2019-03-08 LAB — GLUCOSE, CAPILLARY
Glucose-Capillary: 295 mg/dL — ABNORMAL HIGH (ref 70–99)
Glucose-Capillary: 268 mg/dL — ABNORMAL HIGH (ref 70–99)

## 2019-03-08 NOTE — Progress Notes (Addendum)
Darrell, [name] ([id number]) Visit Report for 03/08/2019 HBO Details Patient Name: Date of Service: [name], [name] 03/08/2019 10:00 AM Medical Record [id number] Patient Account Number: [id number] Date of Birth/Sex: Treating RN: 12/11/1955 (64 y.o. [name] Primary Care [name]: SYSTEM, PCP Other Clinician: [name] [name] Referring [name]: Treating [name]/Extender:[name] Total Treatments Ordered: 40 HBO Treatment Start Date: 03/01/2019 HBO Indication: Diabetic Ulcer(s) of the Lower Extremity HBO Treatment Details Treatment Number: 5 Patient Type: Outpatient Chamber Type: Monoplace Chamber Serial #: [id number] Treatment Protocol: 2.5 ATA with 90 minutes oxygen, with two 5 minute air breaks Treatment Details Compression Rate Down: 2.0 psi / minute De-Compression Rate Up: 2.0 psi / minute Air breaks and CompressTx Pressure breathing periods DecompressDecompress Begins Reached (leave unused spaces Begins Ends blank) Chamber Pressure (ATA)1 2.5 2.5 2.5 2.5 2.5 --2.5 1 Clock Time (24 hr) 09:31 09:43 10:1310:1810:4810:53--11:23 11:35 Treatment Length: 124 (minutes) Treatment Segments: 4 Vital Signs Capillary Blood Glucose Reference Range: 80 - 120 mg / dl HBO Diabetic Blood Glucose Intervention Range: <131 mg/dl or >249 mg/dl Time Vitals Blood Respiratory Capillary Blood Glucose Pulse Action Type: Pulse: Temperature: Taken: Pressure: Rate: Glucose (mg/dl): Meter #: Oximetry (%) Taken: Pre 09:20 147/79 91 15 98 295 Post 11:37 145/92 72 18 97.8 268 Treatment Response Treatment Toleration: Well Treatment Completion Treatment Completed without Adverse Event Status: Additional Procedure Documentation Tissue Sevierity: Necrosis of bone [name] Notes No concerns with treatment given Physician HBO Attestation: I certify that I supervised  this HBO treatment in accordance with Medicare guidelines. A trained Yes emergency response team is readily available per hospital policies and procedures. Continue HBOT as ordered. Yes Electronic Signature(s) Signed: 03/08/2019 5:55:43 PM By: [name] Previous Signature: 03/08/2019 4:33:06 PM Version By: [name] EMT/HBOT Entered By: [name] on 03/08/2019 17:05:54 -------------------------------------------------------------------------------- HBO Safety Checklist Details Patient Name: Date of Service: [name], [name] 03/08/2019 10:00 AM Medical Record [id number] Patient Account Number: [id number] Date of Birth/Sex: Treating RN: [birth year] (64 y.o. [name] Primary Care [name]: SYSTEM, PCP Other Clinician: [name] [name] Referring Aby Gessel: Treating [name]/Extender:[name] in Treatment: 4 HBO Safety Checklist Items Safety Checklist Consent Form Signed Patient voided / foley secured and emptied When did you last eato n/a Last dose of injectable or oral agent insulin NA Ostomy pouch emptied and vented if applicable NA All implantable devices assessed, documented and approved NA Intravenous access site secured and place Valuables secured Linens and cotton and cotton/polyester blend (less than 51% polyester) Personal oil-based products / skin lotions / body lotions removed NA Wigs or hairpieces removed NA Smoking or tobacco materials removed Books / newspapers / magazines / loose paper removed Cologne, aftershave, perfume and deodorant removed Jewelry removed (may wrap wedding band) NA Make-up removed Hair care products removed NA Battery operated devices (external) removed NA Heating patches and chemical warmers removed NA Titanium eyewear removed NA Nail polish cured greater than 10 hours NA Casting material cured greater than 10 hours NA Hearing aids removed NA Loose dentures or partials removed NA Prosthetics have  been removed Patient demonstrates correct use of air break device (if applicable) Patient concerns have been addressed Patient grounding bracelet on and cord attached to chamber Specifics for Inpatients (complete in addition to above) Medication sheet sent with patient Intravenous medications needed or due during therapy sent with patient Drainage tubes (e.g. nasogastric tube or chest tube secured and vented) Endotracheal or Tracheotomy tube secured Cuff  deflated of air and inflated with saline Airway suctioned Electronic Signature(s) Signed: 03/08/2019 4:33:06 PM By: [name] EMT/HBOT Previous Signature: 03/08/2019 11:30:24 AM Version By: [name] EMT/HBOT Entered By: [name] on 03/08/2019 13:01:51

## 2019-03-09 ENCOUNTER — Encounter (HOSPITAL_BASED_OUTPATIENT_CLINIC_OR_DEPARTMENT_OTHER): Payer: Medicaid Other | Admitting: Internal Medicine

## 2019-03-09 NOTE — Progress Notes (Signed)
[name], [name] ([id number]) Visit Report for 03/08/2019 SuperBill Details Patient Name: Date of Service: [name], [name] 03/08/2019 Medical Record [id number] Patient Account Number: [id number] Date of Birth/Sex: Treating RN: [birth year] (64 y.o. [name] Primary Care Provider: SYSTEM, PCP Other Clinician: [name] Referring Provider: Treating Provider/Extender:Aboubacar Matsuo, [name] in Treatment: 4 Diagnosis Coding ICD-10 Codes Code Description E11.621 Type 2 diabetes mellitus with foot ulcer L97.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 of other part of left foot with fat layer exposed M86.672 Other chronic osteomyelitis, left ankle and foot E11.621 Type 2 diabetes mellitus with foot ulcer Facility Procedures CPT4 Description Modifier Quantity Code [id number] 99183-Physician attendance and supervision of hyperbaric oxygen therapy, per 1 session ICD-10 Diagnosis Description E11.621 Type 2 diabetes mellitus with foot ulcer Physician Procedures CPT4 Code Description Modifier Quantity [id number] 21194 - WC PHYS HYPERBARIC OXYGEN THERAPY 1 ICD-10 Diagnosis Description E11.621 Type 2 diabetes mellitus with foot ulcer Electronic Signature(s) Signed: 03/08/2019 4:33:06 PM By: [name] EMT/HBOT Signed: 03/08/2019 5:55:43 PM By: [name] MD Entered By: [name] on 03/08/2019 13:02:31

## 2019-03-09 NOTE — Progress Notes (Signed)
[name], [name] ([id number]) Visit Report for 03/08/2019 Arrival Information Details Patient Name: Date of Service: [name], [name] 03/08/2019 10:00 AM Medical Record [id number] Patient Account Number: [id number] Date of Birth/Sex: Treating RN: 1955-04-08 (64 y.o. [name] Primary Care Zo Loudon: SYSTEM, PCP Other Clinician: [name] Referring Isaih Bulger: Treating Fuquan Wilson/Extender:[name], [name] in Treatment: 4 Visit Information History Since Last Visit Added or deleted any medications: No Patient Arrived: Ambulatory Any new allergies or adverse reactions: No Arrival Time: 09:15 Had a fall or experienced change in No Accompanied By: self activities of daily living that may affect Transfer Assistance: None risk of falls: Patient Identification Verified: Yes Signs or symptoms of abuse/neglect since last No Secondary Verification Process Yes visito Completed: Hospitalized since last visit: No Patient Requires Transmission-Based No Implantable device outside of the clinic excluding No Precautions: cellular tissue based products placed in the center Patient Has Alerts: No since last visit: Pain Present Now: No Electronic Signature(s) Signed: 03/08/2019 4:33:06 PM By: [name] EMT/HBOT Entered By: [name] on 03/08/2019 11:28:41 -------------------------------------------------------------------------------- Encounter Discharge Information Details Patient Name: Date of Service: [name], [name] 03/08/2019 10:00 AM Medical Record [id number] Patient Account Number: [id number] Date of Birth/Sex: Treating RN: Jul 05, 1955 (64 y.o. [name] Primary Care Arrie Zuercher: SYSTEM, PCP Other Clinician: [name] Referring Levoy Geisen: Treating Adebayo Ensminger/Extender:[name], [name] in Treatment: 4 Encounter Discharge Information Items Discharge Condition: Stable Ambulatory Status: Ambulatory Discharge Destination: Home Transportation:  Private Auto Accompanied By: self Schedule Follow-up Appointment: Yes Clinical Summary of Care: Patient Declined Electronic Signature(s) Signed: 03/08/2019 4:33:06 PM By: [name] EMT/HBOT Entered By: [name] on 03/08/2019 13:03:05 -------------------------------------------------------------------------------- Patient/Caregiver Education Details Patient Name: [name] 2/22/2021andnbsp10:00 Date of Service: AM Medical Record [id number] Number: Patient Account Number: [id number] Treating RN: 12-Sep-1955 (64 y.o. Levan Hurst Date of Birth/Gender: M) Other Clinician: [name] Primary Care Treating SYSTEM, PCP [name] Physician: Physician/Extender: Referring Physician: [name] in Treatment: 4 Education Assessment Education Provided To: Patient Education Topics Provided Hyperbaric Oxygenation: Methods: Explain/Verbal Responses: State content correctly Electronic Signature(s) Signed: 03/08/2019 4:33:06 PM By: [name] EMT/HBOT Entered By: [name] on 03/08/2019 13:02:52 -------------------------------------------------------------------------------- Vitals Details Patient Name: Date of Service: [name], [name] 03/08/2019 10:00 AM Medical Record [id number] Patient Account Number: [id number] Date of Birth/Sex: Treating RN: Aug 18, 1955 (63 y.o. [name] Primary Care Rodney Wigger: SYSTEM, PCP Other Clinician: [name] Referring Dorla Guizar: Treating Jolan Upchurch/Extender:[name], [name] in Treatment: 4 Vital Signs Time Taken: 09:20 Temperature (F): 98 Height (in): 74 Pulse (bpm): 91 Weight (lbs): 238 Respiratory Rate (breaths/min): 15 Body Mass Index (BMI): 30.6 Blood Pressure (mmHg): 147/79 Capillary Blood Glucose (mg/dl): 295 Reference Range: 80 - 120 mg / dl Electronic Signature(s) Signed: 03/08/2019 4:33:06 PM By: [name] EMT/HBOT Entered By: [name] on 03/08/2019 11:29:50

## 2019-03-10 ENCOUNTER — Other Ambulatory Visit: Payer: Self-pay

## 2019-03-10 ENCOUNTER — Encounter (HOSPITAL_BASED_OUTPATIENT_CLINIC_OR_DEPARTMENT_OTHER): Payer: Medicaid Other | Admitting: Physician Assistant

## 2019-03-10 DIAGNOSIS — E11621 Type 2 diabetes mellitus with foot ulcer: Secondary | ICD-10-CM | POA: Diagnosis not present

## 2019-03-10 LAB — GLUCOSE, CAPILLARY
Glucose-Capillary: 152 mg/dL — ABNORMAL HIGH (ref 70–99)
Glucose-Capillary: 156 mg/dL — ABNORMAL HIGH (ref 70–99)

## 2019-03-10 NOTE — Progress Notes (Addendum)
Darrell, [name] ([id number]) Visit Report for 03/10/2019 HBO Details Patient Name: Date of Service: [name], [name] 03/10/2019 10:00 AM Medical Record [id number] Patient Account Number: [id number] Date of Birth/Sex: Treating RN: [birth year] (64 y.o. [name] Primary Care Vivianna Piccini: SYSTEM, PCP Other Clinician: [name] Referring Michaelle Bottomley: Treating Caeli Linehan/Extender:Stone III, Vance Gather in Treatment: 4 HBO Treatment Course Details Treatment Course Number: 1 Ordering Kaylon Laroche: [name] Total Treatments Ordered: 40 HBO Treatment Start Date: 03/01/2019 HBO Indication: Diabetic Ulcer(s) of the Lower Extremity HBO Treatment Details Treatment Number: 6 Patient Type: Outpatient Chamber Type: Monoplace Chamber Serial #: [id number] Treatment Protocol: 2.5 ATA with 90 minutes oxygen, with two 5 minute air breaks Treatment Details Compression Rate Down: 2.0 psi / minute De-Compression Rate Up: 2.0 psi / minute Air breaks and CompressTx Pressure breathing periods DecompressDecompress Begins Reached (leave unused spaces Begins Ends blank) Chamber Pressure (ATA)1 2.5 2.5 2.5 2.5 2.5 --2.5 1 Clock Time (24 hr) 09:48 10:00 10:3010:3511:0511:10--11:40 11:52 Treatment Length: 124 (minutes) Treatment Segments: 4 Vital Signs Capillary Blood Glucose Reference Range: 80 - 120 mg / dl HBO Diabetic Blood Glucose Intervention Range: <131 mg/dl or >249 mg/dl Time Vitals Blood Respiratory Capillary Blood Glucose Pulse Action Type: Pulse: Temperature: Taken: Pressure: Rate: Glucose (mg/dl): Meter #: Oximetry (%) Taken: Pre 09:35 113/52 90 18 98.4 152 Post 11:55 131/59 74 15 98.1 156 Treatment Response Treatment Toleration: Well Treatment Completion Treatment Completed without Adverse Event Status: Additional Procedure Documentation Tissue Sevierity: Necrosis of bone Physician HBO Attestation: I certify that I supervised this HBO treatment in accordance with Medicare  guidelines. A trained Yes Yes emergency response team is readily available per hospital policies and procedures. Continue HBOT as ordered. Yes Electronic Signature(s) Signed: 03/10/2019 5:54:10 PM By: Worthy Keeler PA-C Previous Signature: 03/10/2019 5:09:01 PM Version By: [name] EMT/HBOT Entered By: Worthy Keeler on 03/10/2019 17:54:10 -------------------------------------------------------------------------------- HBO Safety Checklist Details Patient Name: Date of Service: Darrell, [name] 03/10/2019 10:00 AM Medical Record [id number] Patient Account Number: [id number] Date of Birth/Sex: Treating RN: November 11, 1955 (64 y.o. [name] Primary Care Chidubem Chaires: SYSTEM, PCP Other Clinician: [name] Referring Anisten Tomassi: Treating Salome Cozby/Extender:Stone III, Vance Gather in Treatment: 4 HBO Safety Checklist Items Safety Checklist Consent Form Signed Patient voided / foley secured and emptied When did you last eato 0800 - chicken salad sammy Last dose of injectable or oral agent insulin NA Ostomy pouch emptied and vented if applicable NA All implantable devices assessed, documented and approved NA Intravenous access site secured and place Valuables secured Linens and cotton and cotton/polyester blend (less than 51% polyester) Personal oil-based products / skin lotions / body lotions removed NA Wigs or hairpieces removed NA Smoking or tobacco materials removed Books / newspapers / magazines / loose paper removed Cologne, aftershave, perfume and deodorant removed Jewelry removed (may wrap wedding band) NA Make-up removed Hair care products removed NA Battery operated devices (external) removed NA Heating patches and chemical warmers removed NA Titanium eyewear removed NA Nail polish cured greater than 10 hours NA Casting material cured greater than 10 hours NA Hearing aids removed NA Loose dentures or partials removed NA Prosthetics have been  removed Patient demonstrates correct use of air break device (if applicable) Patient concerns have been addressed Patient grounding bracelet on and cord attached to chamber Specifics for Inpatients (complete in addition to above) Medication sheet sent with patient Intravenous medications needed or due during therapy sent with patient Drainage tubes (e.g. nasogastric tube or chest tube secured and vented) Endotracheal or Tracheotomy tube  secured Cuff deflated of air and inflated with saline Airway suctioned Electronic Signature(s) Signed: 03/10/2019 11:21:20 AM By: [name] EMT/HBOT Entered By: [name] on 03/10/2019 11:21:20

## 2019-03-10 NOTE — Progress Notes (Signed)
SLYVESTER, LATONA ([id number]) Visit Report for 03/10/2019 Arrival Information Details Patient Name: Date of Service: CORDERA, STINEMAN 03/10/2019 10:00 AM Medical Record [id number] Patient Account Number: [id number] Date of Birth/Sex: Treating RN: Jun 03, 1955 (64 y.o. [name] Primary Care Lexxus Underhill: SYSTEM, PCP Other Clinician: [name] Referring Hayzlee Mcsorley: Treating Elonda Giuliano/Extender:Stone III, Vance Gather in Treatment: 4 Visit Information History Since Last Visit Added or deleted any medications: No Patient Arrived: Ambulatory Any new allergies or adverse reactions: No Arrival Time: 09:30 Had a fall or experienced change in No Accompanied By: self activities of daily living that may affect Transfer Assistance: None risk of falls: Patient Identification Verified: Yes Signs or symptoms of abuse/neglect since last No Secondary Verification Process Yes visito Completed: Hospitalized since last visit: No Patient Requires Transmission-Based No Implantable device outside of the clinic excluding No Precautions: cellular tissue based products placed in the center Patient Has Alerts: No since last visit: Pain Present Now: No Electronic Signature(s) Signed: 03/10/2019 5:09:01 PM By: [name] EMT/HBOT Entered By: [name] on 03/10/2019 11:20:08 -------------------------------------------------------------------------------- Encounter Discharge Information Details Patient Name: Date of Service: KINNEY, SACKMANN 03/10/2019 10:00 AM Medical Record [id number] Patient Account Number: [id number] Date of Birth/Sex: Treating RN: 12/08/1955 (64 y.o. [name] Primary Care Karianne Nogueira: SYSTEM, PCP Other Clinician: [name] Referring Natividad Schlosser: Treating Merilynn Haydu/Extender:Stone III, Vance Gather in Treatment: 4 Encounter Discharge Information Items Discharge Condition: Stable Ambulatory Status: Ambulatory Discharge Destination: Home Transportation:  Private Auto Accompanied By: self Schedule Follow-up Appointment: Yes Clinical Summary of Care: Patient Declined Electronic Signature(s) Signed: 03/10/2019 5:09:01 PM By: [name] EMT/HBOT Entered By: [name] on 03/10/2019 13:33:55 -------------------------------------------------------------------------------- Patient/Caregiver Education Details Patient Name: [name] 2/24/2021andnbsp10:00 Date of Service: AM Medical Record [id number] Number: Patient Account Number: [id number] Treating RN: [birth year] (64 y.o. Baruch Gouty Date of Birth/Gender: M) Other Clinician: [name] Primary Care Treating SYSTEM, PCP Worthy Keeler Physician: Physician/Extender: Referring Physician: [name] in Treatment: 4 Education Assessment Education Provided To: Patient Education Topics Provided Hyperbaric Oxygenation: Methods: Explain/Verbal Responses: State content correctly Electronic Signature(s) Signed: 03/10/2019 5:09:01 PM By: [name] EMT/HBOT Entered By: [name] on 03/10/2019 13:33:41 -------------------------------------------------------------------------------- Vitals Details Patient Name: Date of Service: JADON, HARBAUGH 03/10/2019 10:00 AM Medical Record [id number] Patient Account Number: [id number] Date of Birth/Sex: Treating RN: Mar 06, 1955 (63 y.o. [name] Primary Care Nanako Stopher: SYSTEM, PCP Other Clinician: [name] Referring Krue Peterka: Treating Roxsana Riding/Extender:Stone III, Vance Gather in Treatment: 4 Vital Signs Time Taken: 09:35 Temperature (F): 98.4 Height (in): 74 Pulse (bpm): 90 Weight (lbs): 238 Respiratory Rate (breaths/min): 18 Body Mass Index (BMI): 30.6 Blood Pressure (mmHg): 113/52 Capillary Blood Glucose (mg/dl): 152 Reference Range: 80 - 120 mg / dl Electronic Signature(s) Signed: 03/10/2019 5:09:01 PM By: [name] EMT/HBOT Entered By: [name] on 03/10/2019 11:20:25

## 2019-03-10 NOTE — Progress Notes (Addendum)
GURVIR, SCHROM ([id number]) Visit Report for 03/10/2019 Problem List Details Patient Name: Date of Service: [name], [name] 03/10/2019 10:00 AM Medical Record [id number] Patient Account Number: [id number] Date of Birth/Sex: Treating RN: 1955-11-11 (64 y.o. 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 Other chronic osteomyelitis, left ankle and foot E11.621 Type 2 diabetes mellitus with foot ulcer Facility Procedures CPT4: Description Modifier Quantity Code [id number]-Physician attendance and supervision of hyperbaric oxygen therapy, per 1 session ICD-10 Diagnosis Description  E11.621 Type 2 diabetes mellitus with foot ulcer L97.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 of  other part of left foot with fat layer exposed M86.672 Other chronic osteomyelitis, left ankle and foot Physician Procedures CPT4 Code Description: [id number] - WC PHYS HYPERBARIC OXYGEN THERAPY ICD-10 Diagnosis Description E11.621 Type 2 diabetes mellitus with foot ulcer L97.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 of other part of left foot with M86.672 Other chronic osteomyelitis,  left ankle and foot Modifier: fat layer exp Quantity: 1 osed Electronic Signature(s) Signed: 03/11/2019 2:40:21 PM By: [name] EMT/HBOT Signed: 03/24/2019 5:29:37 PM By: Worthy Keeler PA-C Previous Signature: 03/10/2019 5:54:25 PM Version By: Worthy Keeler PA-C Previous Signature: 03/10/2019 5:09:01 PM Version By: [name] EMT/HBOT Entered By: [name] on 03/11/2019 14:40:21

## 2019-03-11 ENCOUNTER — Other Ambulatory Visit: Payer: Self-pay

## 2019-03-11 ENCOUNTER — Encounter (HOSPITAL_BASED_OUTPATIENT_CLINIC_OR_DEPARTMENT_OTHER): Payer: Self-pay

## 2019-03-11 ENCOUNTER — Encounter (HOSPITAL_BASED_OUTPATIENT_CLINIC_OR_DEPARTMENT_OTHER): Payer: Medicaid Other | Admitting: Internal Medicine

## 2019-03-11 DIAGNOSIS — E11621 Type 2 diabetes mellitus with foot ulcer: Secondary | ICD-10-CM | POA: Diagnosis not present

## 2019-03-11 LAB — GLUCOSE, CAPILLARY
Glucose-Capillary: 89 mg/dL (ref 70–99)
Glucose-Capillary: 102 mg/dL — ABNORMAL HIGH (ref 70–99)
Glucose-Capillary: 68 mg/dL — ABNORMAL LOW (ref 70–99)
Glucose-Capillary: 80 mg/dL (ref 70–99)

## 2019-03-12 ENCOUNTER — Encounter (HOSPITAL_BASED_OUTPATIENT_CLINIC_OR_DEPARTMENT_OTHER): Payer: Medicaid Other | Admitting: Internal Medicine

## 2019-03-15 ENCOUNTER — Other Ambulatory Visit: Payer: Self-pay

## 2019-03-15 ENCOUNTER — Encounter (HOSPITAL_BASED_OUTPATIENT_CLINIC_OR_DEPARTMENT_OTHER): Payer: Medicaid Other | Attending: Internal Medicine | Admitting: Internal Medicine

## 2019-03-15 ENCOUNTER — Encounter (HOSPITAL_BASED_OUTPATIENT_CLINIC_OR_DEPARTMENT_OTHER): Payer: Medicaid Other | Admitting: Internal Medicine

## 2019-03-15 DIAGNOSIS — Z9049 Acquired absence of other specified parts of digestive tract: Secondary | ICD-10-CM | POA: Insufficient documentation

## 2019-03-15 DIAGNOSIS — M869 Osteomyelitis, unspecified: Secondary | ICD-10-CM | POA: Diagnosis not present

## 2019-03-15 DIAGNOSIS — K219 Gastro-esophageal reflux disease without esophagitis: Secondary | ICD-10-CM | POA: Diagnosis not present

## 2019-03-15 DIAGNOSIS — E11621 Type 2 diabetes mellitus with foot ulcer: Secondary | ICD-10-CM | POA: Diagnosis not present

## 2019-03-15 DIAGNOSIS — M109 Gout, unspecified: Secondary | ICD-10-CM | POA: Diagnosis not present

## 2019-03-15 DIAGNOSIS — L97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 of other part of left foot with fat layer exposed: Secondary | ICD-10-CM | POA: Insufficient documentation

## 2019-03-15 DIAGNOSIS — E1169 Type 2 diabetes mellitus with other specified complication: Secondary | ICD-10-CM | POA: Insufficient documentation

## 2019-03-15 DIAGNOSIS — I10 Essential (primary) hypertension: Secondary | ICD-10-CM | POA: Diagnosis not present

## 2019-03-15 DIAGNOSIS — Z87442 Personal history of urinary calculi: Secondary | ICD-10-CM | POA: Diagnosis not present

## 2019-03-15 HISTORY — DX: Type 2 diabetes mellitus with foot ulcer: E11.621

## 2019-03-15 LAB — GLUCOSE, CAPILLARY
Glucose-Capillary: 248 mg/dL — ABNORMAL HIGH (ref 70–99)
Glucose-Capillary: 329 mg/dL — ABNORMAL HIGH (ref 70–99)

## 2019-03-15 NOTE — Progress Notes (Addendum)
RAYBURN, MUNDIS ([id number]) Visit Report for 03/15/2019 Arrival Information Details Patient Name: Date of Service: [name], [name] 03/15/2019 12:30 PM Medical Record [id number] Patient Account Number: [id number] Date of Birth/Sex: Treating RN: November 26, 1955 (64 y.o. [name] Primary Care Laine Fonner: SYSTEM, PCP Other Clinician: Referring Aldina Porta: Treating Mattson Dayal/Extender:[name], [name] in Treatment: 5 Visit Information History Since Last Visit Added or deleted any medications: No Patient Arrived: Ambulatory Any new allergies or adverse reactions: No Arrival Time: 12:45 Had a fall or experienced change in No Accompanied By: self activities of daily living that may affect Transfer Assistance: None risk of falls: Patient Identification Verified: Yes Signs or symptoms of abuse/neglect since last No Secondary Verification Process Completed: Yes visito Patient Requires Transmission-Based No Hospitalized since last visit: No Precautions: Implantable device outside of the clinic excluding No Patient Has Alerts: No cellular tissue based products placed in the center since last visit: Has Dressing in Place as Prescribed: Yes Pain Present Now: Yes Notes blistered noted to each toe on left foot. MD made aware. Electronic Signature(s) Signed: 03/15/2019 5:15:23 PM By: Deon Pilling Entered By: Deon Pilling on 03/15/2019 12:53:46 -------------------------------------------------------------------------------- Lower Extremity Assessment Details Patient Name: Date of Service: Darrell, [name] 03/15/2019 12:30 PM Medical Record [id number] Patient Account Number: [id number] Date of Birth/Sex: Treating RN: 02-27-1955 (64 y.o. [name] Primary Care Shanon Seawright: SYSTEM, PCP Other Clinician: Referring Salvadore Valvano: Treating Jawaun Celmer/Extender:[name], [name] in Treatment: 5 Edema Assessment Assessed: [Left: Yes] [Right: No] Edema: [Left: Ye] [Right:  s] Calf Left: Right: Point of Measurement: 42 cm From Medial Instep 45.5 cm cm Ankle Left: Right: Point of Measurement: 13 cm From Medial Instep 28 cm cm Vascular Assessment Pulses: Dorsalis Pedis Palpable: [Left:Yes] Electronic Signature(s) Signed: 03/15/2019 5:15:23 PM By: Deon Pilling Entered By: Deon Pilling on 03/15/2019 12:52:27 -------------------------------------------------------------------------------- Multi Wound Chart Details Patient Name: Date of Service: Darrell, [name] 03/15/2019 12:30 PM Medical Record [id number] Patient Account Number: [id number] Date of Birth/Sex: Treating RN: February 25, 1955 (64 y.o. M) Primary Care Daejon Lich: SYSTEM, PCP Other Clinician: Referring Kamil Mchaffie: Treating Juris Gosnell/Extender:[name], [name] in Treatment: 5 Vital Signs Height(in): 74 Capillary Blood 329 Glucose(mg/dl): Weight(lbs): 238 Pulse(bpm): 28 Body Mass Index(BMI): 31 Blood Pressure(mmHg): 157/83 Temperature(F): 98.3 Respiratory 20 Rate(breaths/min): Photos: [1:No Photos] [N/A:N/A] Wound Location: [1:Left Foot - Plantar] [N/A:N/A] Wounding Event: [1:Blister] [N/A:N/A] Primary Etiology: [1:Diabetic Wound/Ulcer of the N/A Lower Extremity] Comorbid History: [1:Hypertension, Type II Diabetes] [N/A:N/A] Date Acquired: [1:01/14/2017] [N/A:N/A] Weeks of Treatment: [1:5] [N/A:N/A] Wound Status: [1:Open] [N/A:N/A] Measurements L x W x D 0.5x0.4x0.6 [N/A:N/A] (cm) Area (cm) : [1:0.157] [N/A:N/A] Volume (cm) : [1:0.094] [N/A:N/A] % Reduction in Area: [1:0.00%] [N/A:N/A] % Reduction in Volume: [1:-49.20%] [N/A:N/A] Starting Position 1 [1:12] (o'clock): Ending Position 1 [1:12] (o'clock): Maximum Distance 1 [1:0.3] (cm): Undermining: [1:Yes] [N/A:N/A] Classification: [1:Grade 3] [N/A:N/A] Exudate Amount: [1:Medium] [N/A:N/A] Exudate Type: [1:Serosanguineous] [N/A:N/A] Exudate Color: [1:red, brown] [N/A:N/A] Wound Margin: [1:Flat and Intact]  [N/A:N/A] Granulation Amount: [1:Large (67-100%)] [N/A:N/A] Granulation Quality: [1:Pink, Pale] [N/A:N/A] Necrotic Amount: [1:None Present (0%)] [N/A:N/A] Exposed Structures: [1:Fat Layer (Subcutaneous Tissue) Exposed: Yes Fascia: No Tendon: No Muscle: No Joint: No Bone: No] [N/A:N/A] Epithelialization: [1:Medium (34-66%)] [N/A:N/A] Debridement: [1:Debridement - Excisional] [N/A:N/A] Pre-procedure [1:13:03] [N/A:N/A] Verification/Time Out Taken: Tissue Debrided: [1:Callus, Subcutaneous] [N/A:N/A] Level: [1:Skin/Subcutaneous Tissue] [N/A:N/A] Debridement Area (sq cm):0.2 [N/A:N/A] Instrument: [1:Curette] [N/A:N/A] Bleeding: [1:Minimum] [N/A:N/A] Hemostasis Achieved: [1:Pressure] [N/A:N/A] Procedural Pain: [1:0] [N/A:N/A] Post Procedural Pain: [1:0] [N/A:N/A] Debridement Treatment Procedure was tolerated [N/A:N/A] Response: [1:well] Post Debridement [1:0.5x0.4x0.6] [N/A:N/A] Measurements L x W x D (cm) Post Debridement [1:0.094] [N/A:N/A]  Volume: (cm) Assessment Notes: [1:periwound callus noted. x2 blisters noted to distal from wound noted on plantar.] [N/A:N/A N/A] Treatment Notes Electronic Signature(s) Signed: 03/15/2019 5:45:55 PM By: [name] MD Entered By: [name] on 03/15/2019 13:07:45 -------------------------------------------------------------------------------- Multi-Disciplinary Care Plan Details Patient Name: Date of Service: [name], Darrell 03/15/2019 12:30 PM Medical Record [id number] Patient Account Number: [id number] Date of Birth/Sex: Treating RN: Jan 29, 1955 (64 y.o. [name] Primary Care Taite Schoeppner: SYSTEM, PCP Other Clinician: Referring Sanaz Scarlett: Treating Lyzette Reinhardt/Extender:[name], [name] in Treatment: 5 Active Inactive Nutrition Nursing Diagnoses: Impaired glucose control: actual or potential Potential for alteratiion in Nutrition/Potential for imbalanced nutrition Goals: Patient/caregiver agrees to and verbalizes  understanding of need to use nutritional supplements and/or vitamins as prescribed Date Initiated: 02/08/2019 Date Inactivated: 02/25/2019 Target Resolution Date: 03/12/2019 Goal Status: Met Patient/caregiver will maintain therapeutic glucose control Date Initiated: 02/08/2019 Target Resolution Date: 04/16/2019 Goal Status: Active Interventions: Assess HgA1c results as ordered upon admission and as needed Assess patient nutrition upon admission and as needed per policy Provide education on elevated blood sugars and impact on wound healing Provide education on nutrition Treatment Activities: Education provided on Nutrition : 02/08/2019 Notes: Wound/Skin Impairment Nursing Diagnoses: Impaired tissue integrity Knowledge deficit related to ulceration/compromised skin integrity Goals: Patient/caregiver will verbalize understanding of skin care regimen Date Initiated: 02/08/2019 Date Inactivated: 02/25/2019 Target Resolution Date: 03/12/2019 Goal Status: Met Ulcer/skin breakdown will have a volume reduction of 30% by week 4 Date Initiated: 02/08/2019 Target Resolution Date: 04/16/2019 Goal Status: Active Interventions: Assess patient/caregiver ability to obtain necessary supplies Assess patient/caregiver ability to perform ulcer/skin care regimen upon admission and as needed Assess ulceration(s) every visit Provide education on ulcer and skin care Notes: Electronic Signature(s) Signed: 03/15/2019 5:15:23 PM By: Deon Pilling Entered By: Deon Pilling on 03/15/2019 12:54:10 -------------------------------------------------------------------------------- Pain Assessment Details Patient Name: Date of Service: JERRE, VANDRUNEN 03/15/2019 12:30 PM Medical Record [id number] Patient Account Number: [id number] Date of Birth/Sex: Treating RN: 08/05/1955 (64 y.o. [name] Primary Care Donne Baley: SYSTEM, PCP Other Clinician: Referring Debby Clyne: Treating Taci Sterling/Extender:[name],  [name] in Treatment: 5 Active Problems Location of Pain Severity and Description of Pain Patient Has Paino Yes Site Locations Pain Location: Generalized Pain, Pain in Ulcers Rate the pain. Current Pain Level: 8 Worst Pain Level: 10 Least Pain Level: 0 Tolerable Pain Level: 8 Pain Management and Medication Current Pain Management: Medication: Yes Cold Application: No Rest: Yes Massage: No Activity: No T.E.N.S.: No Heat Application: No Leg drop or elevation: No Is the Current Pain Management Adequate: Adequate How does your wound impact your activities of daily livingo Sleep: Yes Bathing: No Appetite: No Relationship With Others: No Bladder Continence: No Emotions: No Bowel Continence: No Work: No Toileting: No Drive: No Dressing: No Hobbies: Yes Electronic Signature(s) Signed: 03/15/2019 5:15:23 PM By: Deon Pilling Entered By: Deon Pilling on 03/15/2019 12:52:18 -------------------------------------------------------------------------------- Patient/Caregiver Education Details Patient Name: Date of Service: [name] 3/1/2021andnbsp12:30 PM Medical Record [id number] Patient Account Number: [id number] Date of Birth/Gender: Feb 11, 1955 (63 y.o. M) Treating RN: Deon Pilling Primary Care Physician: SYSTEM, PCP Other Clinician: Referring Physician: Treating Physician/Extender:[name], [name] in Treatment: 5 Education Assessment Education Provided To: Patient Education Topics Provided Elevated Blood Sugar/ Impact on Healing: Handouts: Elevated Blood Sugars: How Do They Affect Wound Healing Methods: Explain/Verbal Responses: Reinforcements needed Electronic Signature(s) Signed: 03/15/2019 5:15:23 PM By: Deon Pilling Entered By: Deon Pilling on 03/15/2019 12:54:26 -------------------------------------------------------------------------------- Wound Assessment Details Patient Name: Date of Service: DESMIN, DALEO 03/15/2019 12:30  PM Medical Record [id number] Patient Account Number: [id number]  Date of Birth/Sex: Treating RN: 16-Jun-1955 (64 y.o. M) Primary Care Demetrus Pavao: SYSTEM, PCP Other Clinician: Referring Zaliyah Meikle: Treating Lizette Pazos/Extender:[name], [name] in Treatment: 5 Wound Status Wound Number: 1 Primary Diabetic Wound/Ulcer of the Lower Etiology: Extremity Wound Location: Left Foot - Plantar Wound Status: Open Wounding Event: Blister Comorbid Hypertension, Type II Diabetes Date Acquired: 01/14/2017 History: Weeks Of Treatment: 5 Clustered Wound: No Photos Wound Measurements Length: (cm) 0.5 Width: (cm) 0.4 Depth: (cm) 0.6 Area: (cm) 0.157 Volume: (cm) 0.094 % Reduction in Area: 0% % Reduction in Volume: -49.2% Epithelialization: Medium (34-66%) Tunneling: No Undermining: Yes Starting Position (o'clock): 12 Ending Position (o'clock): 12 Maximum Distance: (cm) 0.3 Wound Description Classification: Grade 3 Wound Margin: Flat and Intact Exudate Amount: Medium Exudate Type: Serosanguineous Exudate Color: red, brown Wound Bed Granulation Amount: Large (67-100%) Granulation Quality: Pink, Pale Necrotic Amount: None Present (0%) Foul Odor After Cleansing: No Slough/Fibrino No Exposed Structure Fascia Exposed: No Fat Layer (Subcutaneous Tissue) Exposed: Yes Tendon Exposed: No Muscle Exposed: No Joint Exposed: No Bone Exposed: No Assessment Notes periwound callus noted. x2 blisters noted to distal from wound noted on plantar. Electronic Signature(s) Signed: 03/18/2019 3:59:23 PM By: [name] EMT/HBOT Previous Signature: 03/15/2019 5:15:23 PM Version By: Deon Pilling Entered By: [name] on 03/18/2019 10:39:20 -------------------------------------------------------------------------------- Vitals Details Patient Name: Date of Service: LYAN, MOYANO 03/15/2019 12:30 PM Medical Record [id number] Patient Account Number: [id number] Date of  Birth/Sex: Treating RN: [birth year] (64 y.o. [name] Primary Care Kylyn Sookram: SYSTEM, PCP Other Clinician: Referring Nikolette Reindl: Treating Marnell Mcdaniel/Extender:[name], [name] in Treatment: 5 Vital Signs Time Taken: 12:42 Temperature (F): 98.3 Height (in): 74 Pulse (bpm): 78 Weight (lbs): 238 Respiratory Rate (breaths/min): 20 Body Mass Index (BMI): 30.6 Blood Pressure (mmHg): 157/83 Capillary Blood Glucose (mg/dl): 329 Reference Range: 80 - 120 mg / dl Electronic Signature(s) Signed: 03/15/2019 5:15:23 PM By: Deon Pilling Entered By: Deon Pilling on 03/15/2019 12:51:57

## 2019-03-15 NOTE — Progress Notes (Signed)
MOO, GRAVLEY ([id number]) Visit Report for 03/15/2019 Arrival Information Details Patient Name: Date of Service: [name], [name] 03/15/2019 10:00 AM Medical Record [id number] Patient Account Number: [id number] Date of Birth/Sex: Treating RN: [birth year] (64 y.o. [name] Primary Care Shaolin Armas: SYSTEM, PCP Other Clinician: Referring Ismaeel Arvelo: Treating Norah Fick/Extender: [name] in Treatment: 5 Visit Information History Since Last Visit Walker Added or deleted any medications: No Patient Arrived: Any new allergies or adverse reactions: No Arrival Time: 10:08 Had a fall or experienced change in No Accompanied By: self activities of daily living that may affect Transfer Assistance: None risk of falls: Patient Identification Verified: Yes Signs or symptoms of abuse/neglect since last No Secondary Verification Process Completed: Yes visito Patient Requires Transmission-Based No Hospitalized since last visit: No Precautions: Implantable device outside of the clinic excluding No Patient Has Alerts: No cellular tissue based products placed in the center since last visit: Has Dressing in Place as Prescribed: Yes Pain Present Now: Yes Electronic Signature(s) Signed: 03/15/2019 5:15:23 PM By: Deon Pilling Entered By: Deon Pilling on 03/15/2019 10:42:55 -------------------------------------------------------------------------------- Encounter Discharge Information Details Patient Name: Date of Service: [name], [name] 03/15/2019 10:00 AM Medical Record [id number] Patient Account Number: [id number] Date of Birth/Sex: Treating RN: Jun 09, 1955 (64 y.o. [name] Primary Care Mixtli Reno: SYSTEM, PCP Other Clinician: Referring Tayonna Bacha: Treating Kacee Sukhu/Extender: [name] in Treatment: 5 Encounter Discharge Information Items Discharge Condition: Stable Ambulatory Status: Walker Discharge Destination: Home Transportation: Private Auto Accompanied By:  self Schedule Follow-up Appointment: Yes Clinical Summary of Care: Electronic Signature(s) Signed: 03/15/2019 5:15:23 PM By: Deon Pilling Entered By: Deon Pilling on 03/15/2019 13:36:04 -------------------------------------------------------------------------------- Pain Assessment Details Patient Name: Date of Service: [name], [name] 03/15/2019 10:00 AM Medical Record [id number] Patient Account Number: [id number] Date of Birth/Sex: Treating RN: [birth year] (64 y.o. [name] Primary Care Bijan Ridgley: SYSTEM, PCP Other Clinician: Referring Xochilth Standish: Treating Saryah Loper/Extender: [name] in Treatment: 5 Active Problems Location of Pain Severity and Description of Pain Patient Has Paino Yes Site Locations Pain Location: Pain in Ulcers Rate the pain. Current Pain Level: 8 Worst Pain Level: 10 Least Pain Level: 0 Tolerable Pain Level: 8 Character of Pain Describe the Pain: Heavy, Sharp Pain Management and Medication Current Pain Management: Medication: Yes Cold Application: No Rest: Yes Massage: No Activity: No T.E.N.S.: No Heat Application: No Leg drop or elevation: No Is the Current Pain Management Adequate: Adequate How does your wound impact your activities of daily livingo Sleep: Yes Bathing: No Appetite: No Relationship With Others: No Bladder Continence: No Emotions: No Bowel Continence: No Work: No Toileting: No Drive: No Dressing: No Hobbies: Yes Electronic Signature(s) Signed: 03/15/2019 5:15:23 PM By: Deon Pilling Entered By: Deon Pilling on 03/15/2019 12:02:37 -------------------------------------------------------------------------------- Patient/Caregiver Education Details Patient Name: [name] 3/1/2021andnbsp10:00 Date of Service: AM Medical Record [id number] Number: Patient Account Number: [id number] Treating RN: 1955-04-28 (64 y.o. Deon Pilling Date of Birth/Gender: M) Other Clinician: Primary Care Physician: SYSTEM, PCP  Treating Referring Physician: Physician/Extender: [name] in Treatment: 5 Education Assessment Education Provided To: Patient Education Topics Provided Elevated Blood Sugar/ Impact on Healing: Handouts: Elevated Blood Sugars: How Do They Affect Wound Healing Methods: Explain/Verbal Responses: Reinforcements needed Electronic Signature(s) Signed: 03/15/2019 5:15:23 PM By: Deon Pilling Entered By: Deon Pilling on 03/15/2019 13:35:51 -------------------------------------------------------------------------------- Vitals Details Patient Name: Date of Service: [name], [name] 03/15/2019 10:00 AM Medical Record [id number] Patient Account Number: [id number] Date of Birth/Sex: Treating RN: 1955/01/16 (64 y.o. [name] Primary Care Lanessa Shill: SYSTEM, PCP Other Clinician: Referring Britanni Yarde: Treating Viyaan Champine/Extender: [name] in Treatment: 5 Vital Signs Time Taken:  10:10 Temperature (F): 98.8 Height (in): 74 Pulse (bpm): 79 Weight (lbs): 238 Respiratory Rate (breaths/min): 18 Body Mass Index (BMI): 30.6 Blood Pressure (mmHg): 107/89 Capillary Blood Glucose (mg/dl): 248 Reference Range: 80 - 120 mg / dl Electronic Signature(s) Signed: 03/15/2019 5:15:23 PM By: Deon Pilling Entered By: Deon Pilling on 03/15/2019 10:43:16

## 2019-03-15 NOTE — Progress Notes (Signed)
[name] ([id number]) Visit Report for 03/15/2019 HBO Details Patient Name: Date of Service: [name], [name] 03/15/2019 10:00 AM Medical Record [id number] Patient Account Number: [id number] Date of Birth/Sex: Treating RN: 01/06/1956 (64 y.o. [name] Primary Care [name]: SYSTEM, PCP Other Clinician: Referring [name]: Treating [name]/Extender:[name], [name] in Treatment: 5 HBO Treatment Course Details Treatment Course Number: 1 Ordering Kyser Wandel: [name] Total Treatments Ordered: 40 HBO Treatment Start Date: 03/01/2019 HBO Indication: Diabetic Ulcer(s) of the Lower Extremity HBO Treatment Details Treatment Number: 7 Patient Type: Outpatient Chamber Type: Monoplace Chamber Serial #: [id number] Treatment Protocol: 2.5 ATA with 90 minutes oxygen, with two 5 minute air breaks Treatment Details Compression Rate Down: 2.5 psi / minute De-Compression Rate Up: 2.5 psi / minute Air breaks and CompressTx Pressure breathing periods DecompressDecompress Begins Reached (leave unused spaces Begins Ends blank) Chamber Pressure (ATA)1 2.5 2.5 2.5 2.5 2.5 --2.5 1 Clock Time (24 hr) 10:38 10:49 11:1911:2411:5411:59--12:29 12:39 Treatment Length: 121 (minutes) Treatment Segments: 4 Vital Signs Capillary Blood Glucose Reference Range: 80 - 120 mg / dl HBO Diabetic Blood Glucose Intervention Range: <131 mg/dl or >249 mg/dl Time Vitals Blood Pulse: Respiratory Capillary Blood Glucose Pulse Action Type: Temperature: Taken: Pressure: Rate: Glucose (mg/dl): Meter #: Oximetry (%) Taken: Pre 10:10 107/89 79 18 98.8 248 Post 12:42 157/83 78 20 98.3 329 see note below Treatment Response Treatment Toleration: Well Treatment Completion Treatment Completed without Adverse Event Status: Treatment Notes MD made aware and agrees to discharge patient. Patient to take insulin when home. Additional Procedure Documentation Tissue Sevierity: Necrosis of bone [name]  Notes Patient tolerated hyperbarics well however he complained of the need to have me look at the wound today because of something the home health nurse saw. We worked him in for a regular wound care visit as well Physician HBO Attestation: I certify that I supervised this HBO treatment in accordance with Medicare guidelines. A trained Yes emergency response team is readily available per hospital policies and procedures. Continue HBOT as ordered. Yes Electronic Signature(s) Signed: 03/15/2019 5:45:55 PM By: [name] MD Entered By: [name] on 03/15/2019 16:44:31 -------------------------------------------------------------------------------- HBO Safety Checklist Details Patient Name: Date of Service: [name] 03/15/2019 10:00 AM Medical Record [id number] Patient Account Number: [id number] Date of Birth/Sex: Treating RN: [birth year] (64 y.o. [name] Primary Care [name]: SYSTEM, PCP Other Clinician: Referring [name]: Treating Jahzier Villalon/Extender: [name] in Treatment: 5 HBO Safety Checklist Items Safety Checklist Consent Form Signed Patient voided / foley secured and emptied When did you last eato 0830; 1/2 paraffin and soda Last dose of injectable or oral agent per patient takes after HBO. NA Ostomy pouch emptied and vented if applicable NA All implantable devices assessed, documented and approved NA Intravenous access site secured and place Valuables secured Linens and cotton and cotton/polyester blend (less than 51% polyester) Personal oil-based products / skin lotions / body lotions removed NA Wigs or hairpieces removed Smoking or tobacco materials removed Books / newspapers / magazines / loose paper removed Cologne, aftershave, perfume and deodorant removed Jewelry removed (may wrap wedding band) NA Make-up removed Hair care products removed NA Battery operated devices (external) removed NA Heating patches and chemical warmers  removed NA Titanium eyewear removed NA Nail polish cured greater than 10 hours NA Casting material cured greater than 10 hours NA Hearing aids removed NA Loose dentures or partials removed NA Prosthetics have been removed Patient demonstrates correct use of air break device (if applicable) Patient concerns have been addressed Patient grounding bracelet on and  cord attached to chamber Specifics for Inpatients (complete in addition to above) Medication sheet sent with patient Intravenous medications needed or due during therapy sent with patient Drainage tubes (e.g. nasogastric tube or chest tube secured and vented) Endotracheal or Tracheotomy tube secured Cuff deflated of air and inflated with saline Airway suctioned Electronic Signature(s) Signed: 03/15/2019 5:15:23 PM By: Deon Pilling Entered By: Deon Pilling on 03/15/2019 10:44:36

## 2019-03-15 NOTE — Progress Notes (Addendum)
[name], [name] ([id number]) Visit Report for 03/15/2019 SuperBill Details Patient Name: Date of Service: [name], LACK 03/15/2019 Medical Record [id number] Patient Account Number: [id number] Date of Birth/Sex: Treating RN: 02/20/1955 (64 y.o. 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, left ankle and foot E11.621 Type 2 diabetes mellitus with foot ulcer Facility Procedures CPT4 Description Modifier Quantity Code [id number]-Physician attendance and supervision of hyperbaric oxygen therapy, per 1 session Physician Procedures CPT4 Code Description Modifier Quantity [id number] [id number] - WC PHYS HYPERBARIC OXYGEN THERAPY 1 ICD-10 Diagnosis Description M86.672 Other chronic osteomyelitis, left ankle and foot Electronic Signature(s) Signed: 03/23/2019 7:32:08 AM By: Darlin Priestly Signed: 03/30/2019 5:29:05 PM By: [name] MD Previous Signature: 03/15/2019 5:15:23 PM Version By: Deon Pilling Entered By: Darlin Priestly on 03/23/2019 07:32:08

## 2019-03-15 NOTE — Progress Notes (Signed)
[name], [name] ([id number]) Visit Report for 03/15/2019 Debridement Details Patient Name: Date of Service: [name], [name] 03/15/2019 12:30 PM Medical Record [id number] Patient Account Number: [id number] Date of Birth/Sex: 1955/11/07 (63 y.o. M) Treating RN: Primary Care Provider: SYSTEM, PCP Other Clinician: Referring Provider: Treating Provider/Extender:[name], [name] in Treatment: 5 Debridement Performed for Wound #1 Left,Plantar Foot Assessment: Performed By: Physician [name]., MD Debridement Type: Debridement Severity of Tissue Pre Fat layer exposed Debridement: Level of Consciousness (Pre- Awake and Alert procedure): Pre-procedure Verification/Time Out Taken: Yes - 13:03 Start Time: 13:03 Total Area Debrided (L x W): 0.5 (cm) x 0.4 (cm) = 0.2 (cm) Tissue and other material Viable, Non-Viable, Callus, Subcutaneous debrided: Level: Skin/Subcutaneous Tissue Debridement Description: Excisional Instrument: Curette Bleeding: Minimum Hemostasis Achieved: Pressure End Time: 13:04 Procedural Pain: 0 Post Procedural Pain: 0 Response to Treatment: Procedure was tolerated well Level of Consciousness Awake and Alert (Post-procedure): Post Debridement Measurements of Total Wound Length: (cm) 0.5 Width: (cm) 0.4 Depth: (cm) 0.6 Volume: (cm) 0.094 Character of Wound/Ulcer Post Improved Debridement: Severity of Tissue Post Debridement: Fat layer exposed Post Procedure Diagnosis Same as Pre-procedure Electronic Signature(s) Signed: 03/15/2019 5:45:55 PM By: Darrell Ham MD Entered By: [name] on 03/15/2019 13:07:52 -------------------------------------------------------------------------------- HPI Details Patient Name: Date of Service: [name], [name] 03/15/2019 12:30 PM Medical Record [id number] Patient Account Number: [id number] Date of Birth/Sex: Treating RN: 06/30/1955 (64 y.o. M) Primary Care Provider: SYSTEM, PCP Other  Clinician: Referring Provider: Treating Provider/Extender:[name], [name] in Treatment: 5 History of Present Illness HPI Description: ADMISSION 02/08/2019 Patient is a 64 year old man who lives in [place]. He comes to clinic today accompanied by his DFS social worker from [place]. The story is that he got a blister and a wound on his foot from a prolonged walk he did almost a year ago after his car broke down. By review of his own pictures on his phone the wound was substantial. He comes with a nice note from his physician at [facility] wound care center. Apparently when he presented with his wound in May 2020 he had a wound on the midfoot with necrosis and gangrene which required extensive incision and drainage. He was treated with a course of Dermagraft with some improvement. He developed an acute infection in December 2020 requiring hospitalization again and he again underwent incision and debridement. Culture reviewed Staph aureus which was oxacillin resistant i.e. MRSA. An MRI during this hospitalization revealed osteomyelitis involving of the shaft and remanence of the head of the first metatarsal and the proximal phalanx of the great toe. There is also osteomyelitis involving the entire second and third metatarsals in the proximal phalanx of the second and third toes. He is on a 6-week course of linezolid and rifampin which she comes into clinic currently taking although I am not completely certain how far along he is in the this course of antibiotics it would apparently be at least a month. He is tolerating these well. Patient tells me he did not tolerate a forefoot offloading boot and he is supposed to be using a surgical sandal however he arrives in clinic today with an old running shoe on. He is using silver alginate on the wounds Past medical history; type 2 diabetes with mild neuropathy, hypertension, gout, lumbar radiculopathy, GERD, nephrolithiasis, back  surgery, cholecystectomy, bilateral lower extremity edema, Nash, pyogenic arthritis of the shoulder in 2010 ABI in our clinic was 0.93 on the left. Previous ABIs quoted in the notes from Dr. Tye Maryland also shows normal  ABIs right and left 2/1; patient again with superficial areas on the base second and third MTPs on the left foot. As pointed out by her intake nurse palpation of the area reveals almost hollow feeling under the skin although nothing looks overtly infected here. We have been using silver alginate 2/11; the area opened into a deeper wound although we have been somewhat concerned about the underlying tissue here. This could have been subdermal fluid. He also snapped a strap on his healing sandal and was wearing a running shoe when he came in today. That may have had something to do with it. We have been using silver alginate We have had some trouble getting his insurance to recognize the Wednesday alternative provider in this clinic 3/1; since the patient was last seen in clinic he is now on hyperbaric oxygen therapy. He claims to be using his healing sandal religiously. He apparently was seen by home health yesterday who told him there was a problem with his foot. He insisted on being seen urgently and we worked him in before our afternoon clinic Engineer, maintenance) Signed: 03/15/2019 5:45:55 PM By: Darrell Ham MD Entered By: [name] on 03/15/2019 13:08:44 -------------------------------------------------------------------------------- Physical Exam Details Patient Name: Date of Service: [name], [name] 03/15/2019 12:30 PM Medical Record [id number] Patient Account Number: [id number] Date of Birth/Sex: Treating RN: 1955/03/12 (64 y.o. M) Primary Care Provider: SYSTEM, PCP Other Clinician: Referring Provider: Treating Provider/Extender:[name], [name] in Treatment: 5 Constitutional Patient is hypertensive.. Pulse regular and within target range for  patient.Marland Kitchen Respirations regular, non-labored and within target range.. Temperature is normal and within the target range for the patient.Marland Kitchen Appears in no distress. Notes Wound exam; the area is in the plantar foot just proximal to the second and third met heads he has a bit of undermining in this area skin and subcutaneous tissue but his wound itself does not look that bad. The area was debrided with a #5 curette to remove the skin and subcutaneous tissue the base of the wound looks satisfactory HOWEVER he has extensive blistering over his first third and fourth met heads as well as the plantar first toe. The patient seems mystified about how this could have happened but obviously there is friction and pressure in an area that is excessive. He would not easily except this I do not see any evidence of infection Electronic Signature(s) Signed: 03/15/2019 5:45:55 PM By: Darrell Ham MD Entered By: [name] on 03/15/2019 13:10:01 -------------------------------------------------------------------------------- Physician Orders Details Patient Name: Date of Service: [name], [name] 03/15/2019 12:30 PM Medical Record [id number] Patient Account Number: [id number] Date of Birth/Sex: Treating RN: 24-Apr-1955 (64 y.o. [name] Primary Care Provider: SYSTEM, PCP Other Clinician: Referring Provider: Treating Provider/Extender:Armonii Sieh, [name] in Treatment: 5 Verbal / Phone Orders: No Diagnosis Coding ICD-10 Coding Code Description E11.621 Type 2 diabetes mellitus with foot ulcer L97.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 of other part of left foot with fat layer exposed M86.672 Other chronic osteomyelitis, left ankle and foot E11.621 Type 2 diabetes mellitus with foot ulcer Follow-up Appointments Return Appointment in: - Friday 3/5 Dressing Change Frequency Wound #1 Left,Plantar Foot Change Dressing every other day. Wound Cleansing Wound #1 Left,Plantar Foot Clean wound with  Wound Cleanser - or normal saline Primary Wound Dressing Wound #1 Left,Plantar Foot Calcium Alginate with Silver Secondary Dressing Foam Kerlix/Rolled Gauze - secure with tape Dry Gauze Edema Control Avoid standing for long periods of time Elevate legs to the level of the heart or above for 30 minutes daily and/or  when sitting, a frequency of: - throughout the day. Off-Loading Open toe surgical shoe to: - left foot felt the [facility] for wound care. - [facility] Electronic Signature(s) Signed: 03/15/2019 5:45:55 PM By: Darrell Ham MD Signed: 03/15/2019 5:57:14 PM By: Levan Hurst RN, BSN Entered By: Levan Hurst on 03/15/2019 13:07:31 -------------------------------------------------------------------------------- Problem List Details Patient Name: Date of Service: [name], [name] 03/15/2019 12:30 PM Medical Record [id number] Patient Account Number: [id number] Date of Birth/Sex: Treating RN: [birth year] (64 y.o. [name] Primary Care Provider: SYSTEM, PCP Other Clinician: Referring Provider: Treating Provider/Extender:Cuinn Westerhold, [name] in Treatment: 5 Active Problems ICD-10 Evaluated Encounter Code Description Active Date Today Diagnosis E11.621 Type 2 diabetes mellitus with foot ulcer 02/08/2019 No Yes L97.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 of other part of left foot 02/08/2019 No Yes with fat layer exposed M86.672 Other chronic osteomyelitis, left ankle and foot 02/08/2019 No Yes E11.621 Type 2 diabetes mellitus with foot ulcer 02/08/2019 No Yes Inactive Problems Resolved Problems Electronic Signature(s) Signed: 03/15/2019 5:45:55 PM By: Darrell Ham MD Entered By: [name] on 03/15/2019 13:07:35 -------------------------------------------------------------------------------- Progress Note Details Patient Name: Date of Service: [name], [name] 03/15/2019 12:30 PM Medical Record [id number] Patient  Account Number: [id number] Date of Birth/Sex: Treating RN: 05-13-1955 (64 y.o. M) Primary Care Provider: SYSTEM, PCP Other Clinician: Referring Provider: Treating Provider/Extender:Izzy Doubek, [name] in Treatment: 5 Subjective History of Present Illness (HPI) ADMISSION 02/08/2019 Patient is a 64 year old man who lives in [place]. He comes to clinic today accompanied by his DFS social worker from [place]. The story is that he got a blister and a wound on his foot from a prolonged walk he did almost a year ago after his car broke down. By review of his own pictures on his phone the wound was substantial. He comes with a nice note from his physician at [facility] wound care center. Apparently when he presented with his wound in May 2020 he had a wound on the midfoot with necrosis and gangrene which required extensive incision and drainage. He was treated with a course of Dermagraft with some improvement. He developed an acute infection in December 2020 requiring hospitalization again and he again underwent incision and debridement. Culture reviewed Staph aureus which was oxacillin resistant i.e. MRSA. An MRI during this hospitalization revealed osteomyelitis involving of the shaft and remanence of the head of the first metatarsal and the proximal phalanx of the great toe. There is also osteomyelitis involving the entire second and third metatarsals in the proximal phalanx of the second and third toes. He is on a 6-week course of linezolid and rifampin which she comes into clinic currently taking although I am not completely certain how far along he is in the this course of antibiotics it would apparently be at least a month. He is tolerating these well. Patient tells me he did not tolerate a forefoot offloading boot and he is supposed to be using a surgical sandal however he arrives in clinic today with an old running shoe on. He is using silver alginate on the wounds Past  medical history; type 2 diabetes with mild neuropathy, hypertension, gout, lumbar radiculopathy, GERD, nephrolithiasis, back surgery, cholecystectomy, bilateral lower extremity edema, Nash, pyogenic arthritis of the shoulder in 2010 ABI in our clinic was 0.93 on the left. Previous ABIs quoted in the notes from Dr. Tye Maryland also shows normal ABIs right and left 2/1; patient again with superficial areas on the base second and third MTPs on the left  foot. As pointed out by her intake nurse palpation of the area reveals almost hollow feeling under the skin although nothing looks overtly infected here. We have been using silver alginate 2/11; the area opened into a deeper wound although we have been somewhat concerned about the underlying tissue here. This could have been subdermal fluid. He also snapped a strap on his healing sandal and was wearing a running shoe when he came in today. That may have had something to do with it. We have been using silver alginate We have had some trouble getting his insurance to recognize the Wednesday alternative provider in this clinic 3/1; since the patient was last seen in clinic he is now on hyperbaric oxygen therapy. He claims to be using his healing sandal religiously. He apparently was seen by home health yesterday who told him there was a problem with his foot. He insisted on being seen urgently and we worked him in before our afternoon clinic Objective Constitutional Patient is hypertensive.. Pulse regular and within target range for patient.Marland Kitchen Respirations regular, non-labored and within target range.. Temperature is normal and within the target range for the patient.Marland Kitchen Appears in no distress. Vitals Time Taken: 12:42 PM, Height: 74 in, Weight: 238 lbs, BMI: 30.6, Temperature: 98.3 F, Pulse: 78 bpm, Respiratory Rate: 20 breaths/min, Blood Pressure: 157/83 mmHg, Capillary Blood Glucose: 329 mg/dl. General Notes: Wound exam; the area is in the plantar foot  just proximal to the second and third met heads he has a bit of undermining in this area skin and subcutaneous tissue but his wound itself does not look that bad. The area was debrided with a #5 curette to remove the skin and subcutaneous tissue the base of the wound looks satisfactory ooHOWEVER he has extensive blistering over his first third and fourth met heads as well as the plantar first toe. The patient seems mystified about how this could have happened but obviously there is friction and pressure in an area that is excessive. He would not easily except this I do not see any evidence of infection Integumentary (Hair, Skin) Wound #1 status is Open. Original cause of wound was Blister. The wound is located on the [place]. The wound measures 0.5cm length x 0.4cm width x 0.6cm depth; 0.157cm^2 area and 0.094cm^3 volume. There is Fat Layer (Subcutaneous Tissue) Exposed exposed. There is no tunneling noted, however, there is undermining starting at 12:00 and ending at 12:00 with a maximum distance of 0.3cm. There is a medium amount of serosanguineous drainage noted. The wound margin is flat and intact. There is large (67-100%) pink, pale granulation within the wound bed. There is no necrotic tissue within the wound bed. General Notes: periwound callus noted. x2 blisters noted to distal from wound noted on plantar. Assessment Active Problems ICD-10 Type 2 diabetes mellitus with foot ulcer Non-pressure chronic ulcer of other part of left foot with fat layer exposed Other chronic osteomyelitis, left ankle and foot Type 2 diabetes mellitus with foot ulcer Procedures Wound #1 Pre-procedure diagnosis of Wound #1 is a Diabetic Wound/Ulcer of the Lower Extremity located on the Left,Plantar Foot .Severity of Tissue Pre Debridement is: Fat layer exposed. There was a Excisional Skin/Subcutaneous Tissue Debridement with a total area of 0.2 sq cm performed by [name]., MD. With the  following instrument(s): Curette to remove Viable and Non-Viable tissue/material. Material removed includes Callus and Subcutaneous Tissue and. No specimens were taken. A time out was conducted at 13:03, prior to the start of the  procedure. A Minimum amount of bleeding was controlled with Pressure. The procedure was tolerated well with a pain level of 0 throughout and a pain level of 0 following the procedure. Post Debridement Measurements: 0.5cm length x 0.4cm width x 0.6cm depth; 0.094cm^3 volume. Character of Wound/Ulcer Post Debridement is improved. Severity of Tissue Post Debridement is: Fat layer exposed. Post procedure Diagnosis Wound #1: Same as Pre-Procedure Plan Follow-up Appointments: Return Appointment in: - Friday 3/5 Dressing Change Frequency: Wound #1 Left,Plantar Foot: Change Dressing every other day. Wound Cleansing: Wound #1 Left,Plantar Foot: Clean wound with Wound Cleanser - or normal saline Primary Wound Dressing: Wound #1 Left,Plantar Foot: Calcium Alginate with Silver Secondary Dressing: Foam Kerlix/Rolled Gauze - secure with tape Dry Gauze Edema Control: Avoid standing for long periods of time Elevate legs to the level of the heart or above for 30 minutes daily and/or when sitting, a frequency of: - throughout the day. Off-Loading: Open toe surgical shoe to: - left foot felt the shoe Home Health: [facility] for wound care. - [facility] 1. Silver alginate to continue to the wound area. 2. Wondered whether he was rubbing up and down on felt in the surgical shoe or that perhaps the shoe was not correctly fastened 3. Foam/rolled gauze secured with tape 4. We will work him in to be seen before he dives on Friday Electronic Signature(s) Signed: 03/15/2019 5:45:55 PM By: Darrell Ham MD Entered By: [name] on 03/15/2019 13:11:19 -------------------------------------------------------------------------------- SuperBill  Details Patient Name: Date of Service: [name], [name] 03/15/2019 Medical Record [id number] Patient Account Number: [id number] Date of Birth/Sex: Treating RN: 1955-09-26 (64 y.o. M) Primary Care Provider: SYSTEM, PCP Other Clinician: Referring Provider: Treating Provider/Extender:Marvin Grabill, [name] in Treatment: 5 Diagnosis Coding ICD-10 Codes Code Description E11.621 Type 2 diabetes mellitus with foot ulcer L97.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 of other part of left foot with fat layer exposed M86.672 Other chronic osteomyelitis, left ankle and foot E11.621 Type 2 diabetes mellitus with foot ulcer Facility Procedures CPT4 Code Description: [id number] 11042 - DEB SUBQ TISSUE 20 SQ CM/< ICD-10 Diagnosis Description L97.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 of other part of left foot with Modifier: fat layer ex Quantity: 1 posed Physician Procedures CPT4 Code Description: [id number] 11173 - WC PHYS SUBQ TISS 20 SQ CM ICD-10 Diagnosis Description L97.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 of other part of left foot with Modifier: fat layer exp Quantity: 1 osed Electronic Signature(s) Signed: 03/15/2019 5:45:55 PM By: Darrell Ham MD Entered By: [name] on 03/15/2019 13:11:35

## 2019-03-16 ENCOUNTER — Other Ambulatory Visit: Payer: Self-pay

## 2019-03-16 ENCOUNTER — Encounter (HOSPITAL_BASED_OUTPATIENT_CLINIC_OR_DEPARTMENT_OTHER): Payer: Medicaid Other | Admitting: Internal Medicine

## 2019-03-16 DIAGNOSIS — E11621 Type 2 diabetes mellitus with foot ulcer: Secondary | ICD-10-CM | POA: Diagnosis not present

## 2019-03-16 LAB — GLUCOSE, CAPILLARY
Glucose-Capillary: 198 mg/dL — ABNORMAL HIGH (ref 70–99)
Glucose-Capillary: 185 mg/dL — ABNORMAL HIGH (ref 70–99)

## 2019-03-17 ENCOUNTER — Encounter (HOSPITAL_BASED_OUTPATIENT_CLINIC_OR_DEPARTMENT_OTHER): Payer: Self-pay

## 2019-03-17 ENCOUNTER — Encounter (HOSPITAL_BASED_OUTPATIENT_CLINIC_OR_DEPARTMENT_OTHER): Payer: Medicaid Other | Admitting: Physician Assistant

## 2019-03-17 NOTE — Progress Notes (Signed)
BARNETT, ELZEY ([id number]) Visit Report for 03/16/2019 Arrival Information Details Patient Name: Date of Service: [name], [name] 03/16/2019 10:00 AM Medical Record [id number] Patient Account Number: [id number] Date of Birth/Sex: Treating RN: 13-Apr-1955 (64 y.o. [name] Primary Care Devrin Monforte: SYSTEM, PCP Other Clinician: Referring Sheyann Sulton: Treating Asjah Rauda/Extender:[name], [name] in Treatment: 5 Visit Information History Since Last Visit Added or deleted any medications: No Patient Arrived: Ambulatory Any new allergies or adverse reactions: No Arrival Time: 09:27 Had a fall or experienced change in No Accompanied By: alone activities of daily living that may affect Transfer Assistance: None risk of falls: Patient Identification Verified: Yes Signs or symptoms of abuse/neglect since last No Secondary Verification Process Yes visito Completed: Hospitalized since last visit: No Patient Requires Transmission-Based No Implantable device outside of the clinic excluding No Precautions: cellular tissue based products placed in the center Patient Has Alerts: No since last visit: Has Dressing in Place as Prescribed: Yes Pain Present Now: No Electronic Signature(s) Signed: 03/17/2019 5:54:02 PM By: Levan Hurst RN, BSN Entered By: Levan Hurst on 03/16/2019 11:48:30 -------------------------------------------------------------------------------- Encounter Discharge Information Details Patient Name: Date of Service: [name], [name] 03/16/2019 10:00 AM Medical Record [id number] Patient Account Number: [id number] Date of Birth/Sex: Treating RN: November 02, 1955 (64 y.o. [name] Primary Care Auden Wettstein: SYSTEM, PCP Other Clinician: Referring Joncarlos Atkison: Treating Derrisha Foos/Extender:[name], [name] in Treatment: 5 Encounter Discharge Information Items Discharge Condition: Stable Ambulatory Status: Ambulatory Discharge Destination:  Home Transportation: Private Auto Accompanied By: alone Schedule Follow-up Appointment: Yes Clinical Summary of Care: Patient Declined Electronic Signature(s) Signed: 03/17/2019 5:54:02 PM By: Levan Hurst RN, BSN Entered By: Levan Hurst on 03/16/2019 12:26:55 -------------------------------------------------------------------------------- Vitals Details Patient Name: Date of Service: [name], [name] 03/16/2019 10:00 AM Medical Record [id number] Patient Account Number: [id number] Date of Birth/Sex: Treating RN: 06/04/1955 (64 y.o. [name] Primary Care Mischa Pollard: SYSTEM, PCP Other Clinician: Referring Elinor Kleine: Treating Sunny Aguon/Extender:[name], [name] in Treatment: 5 Vital Signs Time Taken: 09:28 Temperature (F): 98.9 Height (in): 74 Pulse (bpm): 97 Weight (lbs): 238 Respiratory Rate (breaths/min): 18 Body Mass Index (BMI): 30.6 Blood Pressure (mmHg): 127/89 Capillary Blood Glucose (mg/dl): 198 Reference Range: 80 - 120 mg / dl Electronic Signature(s) Signed: 03/17/2019 5:54:02 PM By: Levan Hurst RN, BSN Entered By: Levan Hurst on 03/16/2019 11:49:04

## 2019-03-17 NOTE — Progress Notes (Signed)
ALVON, NYGAARD ([id number]) Visit Report for 03/16/2019 SuperBill Details Patient Name: Date of Service: [name], [name] 03/16/2019 Medical Record [id number] Patient Account Number: [id number] Date of Birth/Sex: Treating RN: Apr 04, 1955 (64 y.o. [name] Primary Care Provider: SYSTEM, PCP Other Clinician: Referring Provider: Treating Provider/Extender:Rome Echavarria, [name] in Treatment: 5 Diagnosis Coding ICD-10 Codes Code Description E11.621 Type 2 diabetes mellitus with foot ulcer L97.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 of other part of left foot with fat layer exposed M86.672 Other chronic osteomyelitis, left ankle and foot E11.621 Type 2 diabetes mellitus with foot ulcer Facility Procedures CPT4 Description Modifier Quantity Code [id number]-Physician attendance and supervision of hyperbaric oxygen therapy, per 1 session ICD-10 Diagnosis Description E11.621 Type 2 diabetes mellitus with foot ulcer M86.672 Other chronic osteomyelitis, left ankle and foot L97.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 of other part of left foot with fat layer exposed Physician Procedures CPT4 Code Description Modifier Quantity [id number] - WC PHYS HYPERBARIC OXYGEN THERAPY 1 ICD-10 Diagnosis Description E11.621 Type 2 diabetes mellitus with foot ulcer M86.672 Other chronic osteomyelitis, left ankle and foot L97.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 of other part of left foot with fat layer exposed Electronic Signature(s) Signed: 03/16/2019 5:14:23 PM By: [name] MD Signed: 03/17/2019 5:54:02 PM By: Levan Hurst RN, BSN Entered By: Levan Hurst on 03/16/2019 12:26:38

## 2019-03-17 NOTE — Progress Notes (Signed)
CALISTRO, RAUF ([id number]) Visit Report for 03/16/2019 HBO Details Patient Name: Date of Service: [name], [name] 03/16/2019 10:00 AM Medical Record [id number] Patient Account Number: [id number] Date of Birth/Sex: Treating RN: 02-23-1955 (64 y.o. [name] Primary Care Joao Mccurdy: SYSTEM, PCP Other Clinician: Referring Riley Papin: Treating Salisha Bardsley/Extender:[name], [name] in Treatment: 5 HBO Treatment Course Details Treatment Course Number: 1 Ordering Hensley Aziz: [name] Total Treatments Ordered: 40 HBO Treatment Start Date: 03/01/2019 HBO Indication: Diabetic Ulcer(s) of the Lower Extremity HBO Treatment Details Treatment Number: 8 Patient Type: Outpatient Chamber Type: Monoplace Chamber Serial #: [id number] Treatment Protocol: 2.5 ATA with 90 minutes oxygen, with two 5 minute air breaks Treatment Details Compression Rate Down: 2.0 psi / minute De-Compression Rate Up: 2.0 psi / minute Air breaks and CompressTx Pressure breathing periods DecompressDecompress Begins Reached (leave unused spaces Begins Ends blank) Chamber Pressure (ATA)1 2.5 2.5 2.5 2.5 2.5 --2.5 1 Clock Time (24 hr) 09:46 09:57 10:2810:3311:0411:09--11:39 11:50 Treatment Length: 124 (minutes) Treatment Segments: 4 Vital Signs Capillary Blood Glucose Reference Range: 80 - 120 mg / dl HBO Diabetic Blood Glucose Intervention Range: <131 mg/dl or >249 mg/dl Time Vitals Blood Respiratory Capillary Blood Glucose Pulse Action Type: Pulse: Temperature: Taken: Pressure: Rate: Glucose (mg/dl): Meter #: Oximetry (%) Taken: Pre 09:28 127/89 97 18 98.9 198 Post 11:52 142/81 86 18 98.4 185 Treatment Response Treatment Completion Status: Treatment Completed without Adverse Event Additional Procedure Documentation Tissue Sevierity: Fat layer exposed Jessi Jessop Notes No concerns with treatment given Physician HBO Attestation: I certify that I supervised this HBO treatment in accordance with  Medicare guidelines. A trained Yes Yes emergency response team is readily available per hospital policies and procedures. Continue HBOT as ordered. Yes Electronic Signature(s) Signed: 03/16/2019 5:14:23 PM By: [name] MD Entered By: [name] on 03/16/2019 17:11:49 -------------------------------------------------------------------------------- HBO Safety Checklist Details Patient Name: Date of Service: [name], [name] 03/16/2019 10:00 AM Medical Record [id number] Patient Account Number: [id number] Date of Birth/Sex: Treating RN: [birth year] (64 y.o. [name] Primary Care Cato Liburd: SYSTEM, PCP Other Clinician: Referring Vadis Slabach: Treating Shatera Rennert/Extender:[name], [name] in Treatment: 5 HBO Safety Checklist Items Safety Checklist Consent Form Signed Patient voided / foley secured and emptied When did you last eato 0730 - chicken sandwich Last dose of injectable or oral agent 03/15/19 NA Ostomy pouch emptied and vented if applicable NA All implantable devices assessed, documented and approved NA Intravenous access site secured and place Valuables secured Linens and cotton and cotton/polyester blend (less than 51% polyester) Personal oil-based products / skin lotions / body lotions removed Wigs or hairpieces removed Smoking or tobacco materials removed Books / newspapers / magazines / loose paper removed Cologne, aftershave, perfume and deodorant removed Jewelry removed (may wrap wedding band) NA Make-up removed Hair care products removed Battery operated devices (external) removed NA Heating patches and chemical warmers removed NA Titanium eyewear removed NA Nail polish cured greater than 10 hours NA Casting material cured greater than 10 hours NA Hearing aids removed NA Loose dentures or partials removed NA Prosthetics have been removed Patient demonstrates correct use of air break device (if applicable) Patient concerns have been  addressed Patient grounding bracelet on and cord attached to chamber Specifics for Inpatients (complete in addition to above) NA Medication sheet sent with patient NA Intravenous medications needed or due during therapy sent with patient NA Drainage tubes (e.g. nasogastric tube or chest tube secured and vented) NA Endotracheal or Tracheotomy tube secured NA Cuff deflated of air and inflated with saline NA Airway suctioned Electronic  Signature(s) Signed: 03/17/2019 5:54:02 PM By: Levan Hurst RN, BSN Entered By: Levan Hurst on 03/16/2019 12:20:18

## 2019-03-18 ENCOUNTER — Ambulatory Visit (HOSPITAL_BASED_OUTPATIENT_CLINIC_OR_DEPARTMENT_OTHER): Payer: Medicaid Other | Admitting: Internal Medicine

## 2019-03-18 ENCOUNTER — Encounter (HOSPITAL_BASED_OUTPATIENT_CLINIC_OR_DEPARTMENT_OTHER): Payer: Medicaid Other | Admitting: Internal Medicine

## 2019-03-19 ENCOUNTER — Other Ambulatory Visit (HOSPITAL_COMMUNITY)
Admission: RE | Admit: 2019-03-19 | Discharge: 2019-03-19 | Disposition: A | Discharge status: Home / Self Care | Payer: Medicaid Other | Source: Other Acute Inpatient Hospital | Attending: Internal Medicine | Admitting: Internal Medicine

## 2019-03-19 ENCOUNTER — Encounter (HOSPITAL_BASED_OUTPATIENT_CLINIC_OR_DEPARTMENT_OTHER): Payer: Medicaid Other | Admitting: Internal Medicine

## 2019-03-19 ENCOUNTER — Other Ambulatory Visit: Payer: Self-pay

## 2019-03-19 DIAGNOSIS — E11621 Type 2 diabetes mellitus with foot ulcer: Secondary | ICD-10-CM | POA: Diagnosis not present

## 2019-03-19 DIAGNOSIS — B999 Unspecified infectious disease: Secondary | ICD-10-CM | POA: Diagnosis not present

## 2019-03-19 LAB — GLUCOSE, CAPILLARY
Glucose-Capillary: 150 mg/dL — ABNORMAL HIGH (ref 70–99)
Glucose-Capillary: 279 mg/dL — ABNORMAL HIGH (ref 70–99)

## 2019-03-19 NOTE — Progress Notes (Signed)
Darrell, [name] ([id number]) Visit Report for 03/19/2019 Arrival Information Details Patient Name: Date of Service: [name], [name] 03/19/2019 10:00 AM Medical Record [id number] Patient Account Number: [id number] Date of Birth/Sex: Treating RN: Feb 19, 1955 (64 y.o. M) Primary Care [name]: SYSTEM, PCP Other Clinician: [name] Referring Airyn Ellzey: Treating [name]/Extender:[name], [name] in Treatment: 5 Visit Information History Since Last Visit Added or deleted any medications: No Patient Arrived: Ambulatory Any new allergies or adverse reactions: No Arrival Time: 09:30 Had a fall or experienced change in No Accompanied By: self activities of daily living that may affect Transfer Assistance: None risk of falls: Patient Identification Verified: Yes Signs or symptoms of abuse/neglect since last No Secondary Verification Process Yes visito Completed: Hospitalized since last visit: No Patient Requires Transmission-Based No Implantable device outside of the clinic excluding No Precautions: cellular tissue based products placed in the center Patient Has Alerts: No since last visit: Pain Present Now: No Electronic Signature(s) Signed: 03/19/2019 4:01:29 PM By: [name] EMT/HBOT Entered By: [name] on 03/19/2019 10:57:51 -------------------------------------------------------------------------------- Encounter Discharge Information Details Patient Name: Date of Service: [name], [name] 03/19/2019 10:00 AM Medical Record [id number] Patient Account Number: [id number] Date of Birth/Sex: Treating RN: 09/23/1955 (64 y.o. M) Primary Care [name]: SYSTEM, PCP Other Clinician: [name] Referring [name]: Treating [name]/Extender:[name], [name] in Treatment: 5 Encounter Discharge Information Items Discharge Condition: Stable Ambulatory Status: Ambulatory Discharge Destination: Home Transportation: Private Auto Accompanied By:  self Schedule Follow-up Appointment: Yes Clinical Summary of Care: Patient Declined Electronic Signature(s) Signed: 03/19/2019 4:01:29 PM By: [name] EMT/HBOT Entered By: [name] on 03/19/2019 14:46:49 -------------------------------------------------------------------------------- Patient/Caregiver Education Details Patient Name: [name] 3/5/2021andnbsp10:00 Date of Service: AM Medical Record [id number] Number: Patient Account Number: [id number] Treating RN: Aug 12, 1955 (64 y.o. Date of Birth/Gender: M) Other Clinician: [name] Primary Care Physician: SYSTEM, PCP Treating [name] Referring Physician: Physician/Extender: [name] in Treatment: 5 Education Assessment Education Provided To: Patient Education Topics Provided Hyperbaric Oxygenation: Methods: Explain/Verbal Responses: State content correctly Electronic Signature(s) Signed: 03/19/2019 4:01:29 PM By: [name] EMT/HBOT Entered By: [name] on 03/19/2019 14:46:35 -------------------------------------------------------------------------------- Vitals Details Patient Name: Date of Service: [name], [name] 03/19/2019 10:00 AM Medical Record [id number] Patient Account Number: [id number] Date of Birth/Sex: Treating RN: March 18, 1955 (64 y.o. M) Primary Care [name]: SYSTEM, PCP Other Clinician: [name] Referring [name]: Treating [name]/Extender:[name], [name] in Treatment: 5 Vital Signs Time Taken: 09:35 Temperature (F): 98.4 Height (in): 74 Pulse (bpm): 82 Weight (lbs): 238 Respiratory Rate (breaths/min): 18 Body Mass Index (BMI): 30.6 Blood Pressure (mmHg): 140/97 Capillary Blood Glucose (mg/dl): 150 Reference Range: 80 - 120 mg / dl Electronic Signature(s) Signed: 03/19/2019 4:01:29 PM By: [name] EMT/HBOT Entered By: [name] on 03/19/2019 10:58:13

## 2019-03-19 NOTE — Progress Notes (Signed)
[name], [name] ([id number]) Visit Report for 03/19/2019 SuperBill Details Patient Name: Date of Service: [name], [name] 03/19/2019 Medical Record [id number] Patient Account Number: [id number] Date of Birth/Sex: Treating RN: [birth year] (64 y.o. 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, left ankle and foot E11.621 Type 2 diabetes mellitus with foot ulcer Facility Procedures CPT4 Description Modifier Quantity Code [id number]-Physician attendance and supervision of hyperbaric oxygen therapy, per 1 session ICD-10 Diagnosis Description E11.621 Type 2 diabetes mellitus with foot ulcer Physician Procedures CPT4 Code Description Modifier Quantity 5643329 [id number] - WC PHYS HYPERBARIC OXYGEN THERAPY 1 ICD-10 Diagnosis Description E11.621 Type 2 diabetes mellitus with foot ulcer Electronic Signature(s) Signed: 03/19/2019 4:01:29 PM By: [name] EMT/HBOT Signed: 03/19/2019 5:21:33 PM By: [name] MD Entered By: [name] on 03/19/2019 14:46:22

## 2019-03-19 NOTE — Progress Notes (Addendum)
[name], [name] ([id number]) Visit Report for 03/19/2019 HBO Details Patient Name: Date of Service: [name], [name] 03/19/2019 10:00 AM Medical Record [id number] Patient Account Number: [id number] Date of Birth/Sex: Treating RN: [birth year] (64 y.o. M) Primary Care Marielle Mantione: SYSTEM, PCP Other Clinician: [name] Referring Levern Pitter: Treating Daronte Shostak/Extender:[name], [name] in Treatment: 5 HBO Treatment Course Details Treatment Course Number: 1 Ordering Nathan Moctezuma: [name] Total Treatments Ordered: 40 HBO Treatment Start Date: 03/01/2019 HBO Indication: Diabetic Ulcer(s) of the Lower Extremity HBO Treatment Details Treatment Number: 9 Patient Type: Outpatient Chamber Type: Monoplace Chamber Serial #: [id number] Treatment Protocol: 2.5 ATA with 90 minutes oxygen, with two 5 minute air breaks Treatment Details Compression Rate Down: 2.0 psi / minute De-Compression Rate Up: 2.0 psi / minute Air breaks and CompressTx Pressure breathing periods DecompressDecompress Begins Reached (leave unused spaces Begins Ends blank) Chamber Pressure (ATA)1 2.5 2.5 2.5 2.5 2.5 --2.5 1 Clock Time (24 hr) 09:51 10:03 10:3310:3811:0811:13--11:43 11:55 Treatment Length: 124 (minutes) Treatment Segments: 4 Vital Signs Capillary Blood Glucose Reference Range: 80 - 120 mg / dl HBO Diabetic Blood Glucose Intervention Range: <131 mg/dl or >249 mg/dl Time Vitals Blood Respiratory Capillary Blood Glucose Pulse Action Type: Pulse: Temperature: Taken: Pressure: Rate: Glucose (mg/dl): Meter #: Oximetry (%) Taken: Pre 09:35 140/97 82 18 98.4 150 Post 11:57 136/79 69 16 98.1 279 Treatment Response Treatment Toleration: Well Treatment Completion Treatment Completed without Adverse Event Status: Additional Procedure Documentation Tissue Sevierity: Necrosis of bone Karlin Binion Notes No concerns with treatment given. Patient was also seen for HBO eval Physician HBO Attestation: I certify  that I supervised this HBO treatment in accordance with Medicare guidelines. A trained Yes emergency response team is readily available per hospital policies and procedures. Continue HBOT as ordered. Yes Electronic Signature(s) Signed: 03/19/2019 5:21:33 PM By: [name] MD Previous Signature: 03/19/2019 4:01:29 PM Version By: [name] EMT/HBOT Entered By: [name] on 03/19/2019 17:20:11 -------------------------------------------------------------------------------- HBO Safety Checklist Details Patient Name: Date of Service: [name], [name] 03/19/2019 10:00 AM Medical Record [id number] Patient Account Number: [id number] Date of Birth/Sex: Treating RN: [birth year] (64 y.o. M) Primary Care Darling Cieslewicz: SYSTEM, PCP Other Clinician: [name] Referring Akiel Fennell: Treating Lagretta Loseke/Extender:[name], [name] in Treatment: 5 HBO Safety Checklist Items Safety Checklist Consent Form Signed Patient voided / foley secured and emptied When did you last eato n/a Last dose of injectable or oral agent insulin NA Ostomy pouch emptied and vented if applicable NA All implantable devices assessed, documented and approved NA Intravenous access site secured and place Valuables secured Linens and cotton and cotton/polyester blend (less than 51% polyester) Personal oil-based products / skin lotions / body lotions removed NA Wigs or hairpieces removed NA Smoking or tobacco materials removed Books / newspapers / magazines / loose paper removed Cologne, aftershave, perfume and deodorant removed Jewelry removed (may wrap wedding band) NA Make-up removed Hair care products removed NA Battery operated devices (external) removed NA Heating patches and chemical warmers removed NA Titanium eyewear removed NA Nail polish cured greater than 10 hours NA Casting material cured greater than 10 hours NA Hearing aids removed NA Loose dentures or partials removed NA Prosthetics have  been removed Patient demonstrates correct use of air break device (if applicable) Patient concerns have been addressed Patient grounding bracelet on and cord attached to chamber Specifics for Inpatients (complete in addition to above) Medication sheet sent with patient Intravenous medications needed or due during therapy sent with patient Drainage tubes (e.g. nasogastric tube or chest tube secured and vented) Endotracheal or Tracheotomy  tube secured Cuff deflated of air and inflated with saline Airway suctioned Notes Pt c/o pain to the L-2nd toe. Upon inspection, there looked be trauma to the nail bed and toe was actively bleeding (minimal). Bruising noted at top of toe that looked to be a couple days old. RN informed of pt appearance. Electronic Signature(s) Signed: 03/19/2019 11:02:25 AM By: [name] EMT/HBOT Entered By: [name] on 03/19/2019 11:02:24

## 2019-03-19 NOTE — Progress Notes (Signed)
LEVIS, NAZIR ([id number]) Visit Report for 03/19/2019 Debridement Details Patient Name: Date of Service: BURNEY, CALZADILLA 03/19/2019 12:30 PM Medical Record [id number] Patient Account Number: [id number] Date of Birth/Sex: [birth year] (64 y.o. M) Treating RN: Primary Care Provider: SYSTEM, PCP Other Clinician: Referring Provider: Treating Provider/Extender:Demaree Liberto, [name] in Treatment: 5 Debridement Performed for Wound #1 Left,Plantar Foot Assessment: Performed By: Physician [name], MD Debridement Type: Debridement Severity of Tissue Pre Fat layer exposed Debridement: Level of Consciousness (Pre- Awake and Alert procedure): Pre-procedure Verification/Time Out Taken: Yes - 13:20 Start Time: 13:21 Pain Control: Other : benzocaine 20% spray Total Area Debrided (L x W): 0.5 (cm) x 0.3 (cm) = 0.15 (cm) Tissue and other material Viable, Non-Viable, Slough, Subcutaneous, Slough debrided: Level: Skin/Subcutaneous Tissue Debridement Description: Excisional Instrument: Curette Bleeding: Minimum Hemostasis Achieved: Pressure End Time: 13:23 Procedural Pain: 0 Post Procedural Pain: 0 Response to Treatment: Procedure was tolerated well Level of Consciousness Awake and Alert (Post-procedure): Post Debridement Measurements of Total Wound Length: (cm) 0.5 Width: (cm) 0.3 Depth: (cm) 0.3 Volume: (cm) 0.035 Character of Wound/Ulcer Post Improved Debridement: Severity of Tissue Post Debridement: Fat layer exposed Post Procedure Diagnosis Same as Pre-procedure Electronic Signature(s) Signed: 03/19/2019 5:21:33 PM By: [name] MD Entered By: [name] on 03/19/2019 13:29:17 -------------------------------------------------------------------------------- HPI Details Patient Name: Date of Service: MANFRED, LASPINA 03/19/2019 12:30 PM Medical Record [id number] Patient Account Number: [id number] Date of Birth/Sex: Treating RN: Apr 06, 1955 (64 y.o.  M) Primary Care Provider: SYSTEM, PCP Other Clinician: Referring Provider: Treating Provider/Extender:Masaru Chamberlin, [name] in Treatment: 5 History of Present Illness HPI Description: ADMISSION 02/08/2019 Patient is a 64 year old man who lives in [place]. He comes to clinic today accompanied by his DFS social worker from [place]. The story is that he got a blister and a wound on his foot from a prolonged walk he did almost a year ago after his car broke down. By review of his own pictures on his phone the wound was substantial. He comes with a nice note from his physician at [facility] wound care center. Apparently when he presented with his wound in May 2020 he had a wound on the midfoot with necrosis and gangrene which required extensive incision and drainage. He was treated with a course of Dermagraft with some improvement. He developed an acute infection in December 2020 requiring hospitalization again and he again underwent incision and debridement. Culture reviewed Staph aureus which was oxacillin resistant i.e. MRSA. An MRI during this hospitalization revealed osteomyelitis involving of the shaft and remanence of the head of the first metatarsal and the proximal phalanx of the great toe. There is also osteomyelitis involving the entire second and third metatarsals in the proximal phalanx of the second and third toes. He is on a 6-week course of linezolid and rifampin which she comes into clinic currently taking although I am not completely certain how far along he is in the this course of antibiotics it would apparently be at least a month. He is tolerating these well. Patient tells me he did not tolerate a forefoot offloading boot and he is supposed to be using a surgical sandal however he arrives in clinic today with an old running shoe on. He is using silver alginate on the wounds Past medical history; type 2 diabetes with mild neuropathy, hypertension, gout, lumbar  radiculopathy, GERD, nephrolithiasis, back surgery, cholecystectomy, bilateral lower extremity edema, Nash, pyogenic arthritis of the shoulder in 2010 ABI in our clinic was 0.93 on the left. Previous ABIs quoted in  the notes from Dr. Tye Maryland also shows normal ABIs right and left 2/1; patient again with superficial areas on the base second and third MTPs on the left foot. As pointed out by her intake nurse palpation of the area reveals almost hollow feeling under the skin although nothing looks overtly infected here. We have been using silver alginate 2/11; the area opened into a deeper wound although we have been somewhat concerned about the underlying tissue here. This could have been subdermal fluid. He also snapped a strap on his healing sandal and was wearing a running shoe when he came in today. That may have had something to do with it. We have been using silver alginate We have had some trouble getting his insurance to recognize the Wednesday alternative provider in this clinic 3/1; since the patient was last seen in clinic he is now on hyperbaric oxygen therapy. He claims to be using his healing sandal religiously. He apparently was seen by home health yesterday who told him there was a problem with his foot. He insisted on being seen urgently and we worked him in before our afternoon clinic 3/5; the patient is continuing on hyperbarics for the underlying osteomyelitis. His original wound in the left plantar midfoot is about the same there is some depth however he has blisters now with an open wound over the first toe, third toe, fourth toe and beginning on the fifth toe. He had extensive blistered area over the metatarsal heads 1st- 4th. He has a surgical shoe but denies excessive ambulation walking barefoot topical application of anything etc. While changing for HBO today he tore the nail off his third toe and he has a wide open wound here currently. He is relatively  Veterinary surgeon) Signed: 03/19/2019 5:21:33 PM By: [name] MD Entered By: [name] on 03/19/2019 13:31:00 -------------------------------------------------------------------------------- Physical Exam Details Patient Name: Date of Service: KATIE, MOCH 03/19/2019 12:30 PM Medical Record [id number] Patient Account Number: [id number] Date of Birth/Sex: Treating RN: 03-21-1955 (64 y.o. M) Primary Care Provider: SYSTEM, PCP Other Clinician: Referring Provider: Treating Provider/Extender:Helmuth Recupero, [name] in Treatment: 5 Constitutional Patient is hypertensive.. Pulse regular and within target range for patient.Marland Kitchen Respirations regular, non-labored and within target range.. Temperature is normal and within the target range for the patient.Marland Kitchen Appears in no distress. Cardiovascular Pedal pulses are intact. Notes Wound exam; the areas on the plantar foot just proximal to the second and third met heads. Raised callus some undermining but not too bad in this area. However now an open area on the plantar first toe where he had a blister 4 days ago. He has extensive blistering over his 1st-4th met head. Also areas on the plantar second toe fourth toe and fifth toe. Tense blisters here. He removed the nail from the left second toe wide open wound here. Tense blister on the plantar part of this toe. Electronic Signature(s) Signed: 03/19/2019 5:21:33 PM By: [name] MD Entered By: [name] on 03/19/2019 13:34:26 -------------------------------------------------------------------------------- Physician Orders Details Patient Name: Date of Service: JEVAN, GAUNT 03/19/2019 12:30 PM Medical Record [id number] Patient Account Number: [id number] Date of Birth/Sex: Treating RN: 1955/12/03 (64 y.o. [name] Primary Care Provider: SYSTEM, PCP Other Clinician: Referring Provider: Treating Provider/Extender:Valerya Maxton, [name] in  Treatment: 5 Verbal / Phone Orders: No Diagnosis Coding ICD-10 Coding Code Description E11.621 Type 2 diabetes mellitus with foot ulcer L97.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 of other part of left foot with fat layer exposed M86.672 Other chronic osteomyelitis, left ankle and foot  E11.621 Type 2 diabetes mellitus with foot ulcer Follow-up Appointments Return Appointment in 1 week. Dressing Change Frequency Wound #1 Left,Plantar Foot Change Dressing every other day. Wound #2 Left Toe Great Change Dressing every other day. Wound #3 Left Toe Second Change Dressing every other day. Wound Cleansing Wound #1 Left,Plantar Foot Clean wound with Wound Cleanser - or normal saline No shower or tub bath. Wound #2 Left Toe Great Clean wound with Wound Cleanser - or normal saline No shower or tub bath. Wound #3 Left Toe Second Clean wound with Wound Cleanser - or normal saline No shower or tub bath. Primary Wound Dressing Other: - silver alginate to any blistered areas on foot Wound #1 Left,Plantar Foot Calcium Alginate with Silver Wound #2 Left Toe Great Calcium Alginate with Silver Wound #3 Left Toe Second Calcium Alginate with Silver Secondary Dressing Wound #1 Left,Plantar Foot Foam Kerlix/Rolled Gauze - secure with tape Dry Gauze Wound #2 Left Toe Great Foam Kerlix/Rolled Gauze - secure with tape Dry Gauze Wound #3 Left Toe Second Foam Kerlix/Rolled Gauze - secure with tape Dry Gauze Edema Control Avoid standing for long periods of time Elevate legs to the level of the heart or above for 30 minutes daily and/or when sitting, a frequency of: - throughout the day. Off-Loading Wedge shoe to: - darco front offloading shoe left foot Other: - minimal weight bearing left foot [facility]. - [facility] Bacteria identified in Unspecified specimen by Anaerobe culture (MICRO) - left 2nd toe LOINC Code: 025-8 Convenience  Name: Anerobic culture Electronic Signature(s) Signed: 03/19/2019 5:21:33 PM By: [name] MD Signed: 03/19/2019 5:33:53 PM By: Baruch Gouty RN, BSN Entered By: Baruch Gouty on 03/19/2019 13:26:56 -------------------------------------------------------------------------------- Problem List Details Patient Name: Date of Service: ARNET, HOFFERBER 03/19/2019 12:30 PM Medical Record [id number] Patient Account Number: [id number] Date of Birth/Sex: Treating RN: Feb 15, 1955 (64 y.o. [name] Primary Care Provider: SYSTEM, PCP Other Clinician: Referring Provider: Treating Provider/Extender:Rajeev Escue, [name] in Treatment: 5 Active Problems ICD-10 Evaluated Encounter Code Description Active Date Today Diagnosis E11.621 Type 2 diabetes mellitus with foot ulcer 02/08/2019 No Yes L97.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 of other part of left foot 02/08/2019 No Yes with fat layer exposed M86.672 Other chronic osteomyelitis, left ankle and foot 02/08/2019 No Yes E11.621 Type 2 diabetes mellitus with foot ulcer 02/08/2019 No Yes Inactive Problems Resolved Problems Electronic Signature(s) Signed: 03/19/2019 5:21:33 PM By: [name] MD Entered By: [name] on 03/19/2019 13:27:31 -------------------------------------------------------------------------------- Progress Note Details Patient Name: Date of Service: ZACCHEAUS, STORLIE 03/19/2019 12:30 PM Medical Record [id number] Patient Account Number: [id number] Date of Birth/Sex: Treating RN: 06/28/1955 (64 y.o. M) Primary Care Provider: SYSTEM, PCP Other Clinician: Referring Provider: Treating Provider/Extender:Lavonn Maxcy, [name] in Treatment: 5 Subjective History of Present Illness (HPI) ADMISSION 02/08/2019 Patient is a 64 year old man who lives in [place]. He comes to clinic today accompanied by his DFS social worker from [place]. The story is that he got a blister and a wound on his  foot from a prolonged walk he did almost a year ago after his car broke down. By review of his own pictures on his phone the wound was substantial. He comes with a nice note from his physician at [facility] wound care center. Apparently when he presented with his wound in May 2020 he had a wound on the midfoot with necrosis and gangrene which required extensive incision and drainage. He was treated with a course of Dermagraft with  some improvement. He developed an acute infection in December 2020 requiring hospitalization again and he again underwent incision and debridement. Culture reviewed Staph aureus which was oxacillin resistant i.e. MRSA. An MRI during this hospitalization revealed osteomyelitis involving of the shaft and remanence of the head of the first metatarsal and the proximal phalanx of the great toe. There is also osteomyelitis involving the entire second and third metatarsals in the proximal phalanx of the second and third toes. He is on a 6-week course of linezolid and rifampin which she comes into clinic currently taking although I am not completely certain how far along he is in the this course of antibiotics it would apparently be at least a month. He is tolerating these well. Patient tells me he did not tolerate a forefoot offloading boot and he is supposed to be using a surgical sandal however he arrives in clinic today with an old running shoe on. He is using silver alginate on the wounds Past medical history; type 2 diabetes with mild neuropathy, hypertension, gout, lumbar radiculopathy, GERD, nephrolithiasis, back surgery, cholecystectomy, bilateral lower extremity edema, Nash, pyogenic arthritis of the shoulder in 2010 ABI in our clinic was 0.93 on the left. Previous ABIs quoted in the notes from Dr. Tye Maryland also shows normal ABIs right and left 2/1; patient again with superficial areas on the base second and third MTPs on the left foot. As pointed out by her intake nurse  palpation of the area reveals almost hollow feeling under the skin although nothing looks overtly infected here. We have been using silver alginate 2/11; the area opened into a deeper wound although we have been somewhat concerned about the underlying tissue here. This could have been subdermal fluid. He also snapped a strap on his healing sandal and was wearing a running shoe when he came in today. That may have had something to do with it. We have been using silver alginate We have had some trouble getting his insurance to recognize the Wednesday alternative provider in this clinic 3/1; since the patient was last seen in clinic he is now on hyperbaric oxygen therapy. He claims to be using his healing sandal religiously. He apparently was seen by home health yesterday who told him there was a problem with his foot. He insisted on being seen urgently and we worked him in before our afternoon clinic 3/5; the patient is continuing on hyperbarics for the underlying osteomyelitis. His original wound in the left plantar midfoot is about the same there is some depth however he has blisters now with an open wound over the first toe, third toe, fourth toe and beginning on the fifth toe. He had extensive blistered area over the metatarsal heads 1st- 4th. He has a surgical shoe but denies excessive ambulation walking barefoot topical application of anything etc. While changing for HBO today he tore the nail off his third toe and he has a wide open wound here currently. He is relatively insensate Objective Constitutional Patient is hypertensive.. Pulse regular and within target range for patient.Marland Kitchen Respirations regular, non-labored and within target range.. Temperature is normal and within the target range for the patient.Marland Kitchen Appears in no distress. Vitals Time Taken: 12:54 PM, Height: 74 in, Weight: 238 lbs, BMI: 30.6, Temperature: 98.3 F, Pulse: 79 bpm, Respiratory Rate: 19 breaths/min, Blood Pressure:  157/72 mmHg. Cardiovascular Pedal pulses are intact. General Notes: Wound exam; the areas on the plantar foot just proximal to the second and third met heads. Raised callus some undermining but  not too bad in this area. ooHowever now an open area on the plantar first toe where he had a blister 4 days ago. He has extensive blistering over his 1st-4th met head. Also areas on the plantar second toe fourth toe and fifth toe. Tense blisters here. ooHe removed the nail from the left second toe wide open wound here. Tense blister on the plantar part of this toe. Integumentary (Hair, Skin) Wound #1 status is Open. Original cause of wound was Blister. The wound is located on the [place]. The wound measures 0.5cm length x 0.3cm width x 0.3cm depth; 0.118cm^2 area and 0.035cm^3 volume. There is Fat Layer (Subcutaneous Tissue) Exposed exposed. There is no tunneling or undermining noted. There is a medium amount of serosanguineous drainage noted. The wound margin is flat and intact. There is large (67-100%) pink, pale granulation within the wound bed. There is no necrotic tissue within the wound bed. Wound #2 status is Open. Original cause of wound was Blister. The wound is located on the Left Toe Great. The wound measures 1.8cm length x 2.5cm width x 0.1cm depth; 3.534cm^2 area and 0.353cm^3 volume. There is Fat Layer (Subcutaneous Tissue) Exposed exposed. There is no tunneling or undermining noted. There is a medium amount of serosanguineous drainage noted. There is large (67-100%) red granulation within the wound bed. Wound #3 status is Open. Original cause of wound was Blister. The wound is located on the Left Toe Second. The wound measures 3.9cm length x 2cm width x 0.2cm depth; 6.126cm^2 area and 1.225cm^3 volume. There is Fat Layer (Subcutaneous Tissue) Exposed exposed. There is no tunneling or undermining noted. There is a medium amount of serosanguineous drainage noted. There is large  (67-100%) red granulation within the wound bed. There is no necrotic tissue within the wound bed. Assessment Active Problems ICD-10 Type 2 diabetes mellitus with foot ulcer Non-pressure chronic ulcer of other part of left foot with fat layer exposed Other chronic osteomyelitis, left ankle and foot Type 2 diabetes mellitus with foot ulcer Procedures Wound #1 Pre-procedure diagnosis of Wound #1 is a Diabetic Wound/Ulcer of the Lower Extremity located on the Left,Plantar Foot .Severity of Tissue Pre Debridement is: Fat layer exposed. There was a Excisional Skin/Subcutaneous Tissue Debridement with a total area of 0.15 sq cm performed by [name], MD. With the following instrument(s): Curette to remove Viable and Non-Viable tissue/material. Material removed includes Subcutaneous Tissue and Slough and after achieving pain control using Other (benzocaine 20% spray). No specimens were taken. A time out was conducted at 13:20, prior to the start of the procedure. A Minimum amount of bleeding was controlled with Pressure. The procedure was tolerated well with a pain level of 0 throughout and a pain level of 0 following the procedure. Post Debridement Measurements: 0.5cm length x 0.3cm width x 0.3cm depth; 0.035cm^3 volume. Character of Wound/Ulcer Post Debridement is improved. Severity of Tissue Post Debridement is: Fat layer exposed. Post procedure Diagnosis Wound #1: Same as Pre-Procedure Plan Follow-up Appointments: Return Appointment in 1 week. Dressing Change Frequency: Wound #1 Left,Plantar Foot: Change Dressing every other day. Wound #2 Left Toe Great: Change Dressing every other day. Wound #3 Left Toe Second: Change Dressing every other day. Wound Cleansing: Wound #1 Left,Plantar Foot: Clean wound with Wound Cleanser - or normal saline No shower or tub bath. Wound #2 Left Toe Great: Clean wound with Wound Cleanser - or normal saline No shower or tub bath. Wound #3  Left Toe Second: Clean wound with Wound  Cleanser - or normal saline No shower or tub bath. Primary Wound Dressing: Other: - silver alginate to any blistered areas on foot Wound #1 Left,Plantar Foot: Calcium Alginate with Silver Wound #2 Left Toe Great: Calcium Alginate with Silver Wound #3 Left Toe Second: Calcium Alginate with Silver Secondary Dressing: Wound #1 Left,Plantar Foot: Foam Kerlix/Rolled Gauze - secure with tape Dry Gauze Wound #2 Left Toe Great: Foam Kerlix/Rolled Gauze - secure with tape Dry Gauze Wound #3 Left Toe Second: Foam Kerlix/Rolled Gauze - secure with tape Dry Gauze Edema Control: Avoid standing for long periods of time Elevate legs to the level of the heart or above for 30 minutes daily and/or when sitting, a frequency of: - throughout the day. Off-Loading: Wedge shoe to: - darco front offloading shoe left foot Other: - minimal weight bearing left foot Home Health: [facility] skilled nursing for wound care. - [facility] ordered were: Anerobic culture - left 2nd toe 1. He now has the open area of the original open area and an area on the left great toe 2. Denuded nail from the left second toe 3. Multiple blisters on the plantar aspect of several toes and the first the fourth met heads. I am not sure what he is doing. General discussion did not really realize an obvious cause although he must be putting pressure and friction in the surgical shoe. 4. He was treated for osteomyelitis of the right foot with a prolonged course of linezolid and rifampin for MSSA however I am not exactly sure of the duration here. 5. The patient came here with a request for hyperbaric oxygen. Small wound on his midfoot at the base of the second and third metatarsal heads. Since then it has been progressively downhill and I am not exactly sure why. Multiple blisters now with a wound on the first plantar toe the nailbed of the left second. 6. I have  cultured the left second toe but no additional antibiotics 7. I am going to try to offload this area with a forefoot off loader, maybe a total contact cast Electronic Signature(s) Signed: 03/19/2019 5:21:33 PM By: [name] MD Entered By: [name] on 03/19/2019 13:37:50 -------------------------------------------------------------------------------- SuperBill Details Patient Name: Date of Service: ABBOTT, JASINSKI 03/19/2019 Medical Record [id number] Patient Account Number: [id number] Date of Birth/Sex: Treating RN: 10/03/1955 (64 y.o. [name] Primary Care Provider: SYSTEM, PCP Other Clinician: Referring Provider: Treating Provider/Extender:Makana Feigel, [name] in Treatment: 5 Diagnosis Coding ICD-10 Codes Code Description E11.621 Type 2 diabetes mellitus with foot ulcer L97.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 of other part of left foot with fat layer exposed M86.672 Other chronic osteomyelitis, left ankle and foot E11.621 Type 2 diabetes mellitus with foot ulcer Facility Procedures CPT4 Code Description: [id number] 11042 - DEB SUBQ TISSUE 20 SQ CM/< ICD-10 Diagnosis Description L97.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 of other part of left foot with Modifier: fat layer ex Quantity: 1 posed Physician Procedures CPT4 Code Description: [id number] - WC PHYS SUBQ TISS 20 SQ CM ICD-10 Diagnosis Description L97.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 of other part of left foot with Modifier: fat layer exp Quantity: 1 osed Electronic Signature(s) Signed: 03/19/2019 5:21:33 PM By: [name] MD Entered By: [name] on 03/19/2019 13:38:06

## 2019-03-22 ENCOUNTER — Encounter (HOSPITAL_BASED_OUTPATIENT_CLINIC_OR_DEPARTMENT_OTHER): Payer: Medicaid Other | Admitting: Internal Medicine

## 2019-03-22 ENCOUNTER — Other Ambulatory Visit: Payer: Self-pay

## 2019-03-22 DIAGNOSIS — E11621 Type 2 diabetes mellitus with foot ulcer: Secondary | ICD-10-CM | POA: Diagnosis not present

## 2019-03-22 LAB — GLUCOSE, CAPILLARY
Glucose-Capillary: 265 mg/dL — ABNORMAL HIGH (ref 70–99)
Glucose-Capillary: 229 mg/dL — ABNORMAL HIGH (ref 70–99)

## 2019-03-22 NOTE — Progress Notes (Signed)
[name], [name] ([id number]) Visit Report for 03/22/2019 SuperBill Details Patient Name: Date of Service: [name], [name] 03/22/2019 Medical Record [id number] Patient Account Number: [id number] Date of Birth/Sex: Treating RN: 1955/02/05 (64 y.o. 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, left ankle and foot E11.621 Type 2 diabetes mellitus with foot ulcer Facility Procedures CPT4 Description Modifier Quantity Code [id number] [id number]-Physician attendance and supervision of hyperbaric oxygen therapy, per 1 session ICD-10 Diagnosis Description E11.621 Type 2 diabetes mellitus with foot ulcer Physician Procedures CPT4 Code Description Modifier Quantity 5732256 72091 - WC PHYS HYPERBARIC OXYGEN THERAPY 1 ICD-10 Diagnosis Description E11.621 Type 2 diabetes mellitus with foot ulcer Electronic Signature(s) Signed: 03/22/2019 4:54:41 PM By: [name] EMT/HBOT Signed: 03/22/2019 5:29:15 PM By: [name] MD Entered By: [name] on 03/22/2019 13:42:58

## 2019-03-22 NOTE — Progress Notes (Addendum)
[name], [name] ([id number]) Visit Report for 03/19/2019 Arrival Information Details Patient Name: Date of Service: [name], [name] 03/19/2019 12:30 PM Medical Record [id number] Patient Account Number: [id number] Date of Birth/Sex: Treating RN: 11-Dec-1955 (64 y.o. [name] Primary Care Tameeka Luo: SYSTEM, PCP Other Clinician: Referring Taimi Towe: Treating Devyne Hauger/Extender:[name], [name] in Treatment: 5 Visit Information History Since Last Visit Added or deleted any No Patient Arrived: Ambulatory medications: Arrival Time: 12:51 Any new allergies or adverse No Accompanied By: self reactions: Transfer Assistance: None Had a fall or experienced change No Patient Identification Verified: Yes in Secondary Verification Process Yes activities of daily living that may Completed: affect Patient Requires Transmission-Based No risk of falls: Precautions: Signs or symptoms of No Patient Has Alerts: No abuse/neglect since last visito Hospitalized since last visit: No Implantable device outside of the No clinic excluding cellular tissue based products placed in the center since last visit: Has Dressing in Place as Yes Prescribed: Has Footwear/Offloading in Place Yes as Prescribed: Left: Surgical Shoe with Pressure Relief Insole Pain Present Now: Yes Electronic Signature(s) Signed: 03/19/2019 5:34:19 PM By: Deon Pilling Entered By: Deon Pilling on 03/19/2019 12:59:15 -------------------------------------------------------------------------------- Encounter Discharge Information Details Patient Name: Date of Service: [name], [name] 03/19/2019 12:30 PM Medical Record [id number] Patient Account Number: [id number] Date of Birth/Sex: Treating RN: 1955-12-22 (64 y.o. [name] Primary Care Rigo Letts: SYSTEM, PCP Other Clinician: Referring Tequisha Maahs: Treating Zanaya Baize/Extender:[name], [name] in Treatment: 5 Encounter Discharge Information Items  Post Procedure Vitals Discharge Condition: Stable Temperature (F): 98.3 Ambulatory Status: Ambulatory Pulse (bpm): 79 Discharge Destination: Home Respiratory Rate (breaths/min): 19 Transportation: Private Auto Blood Pressure (mmHg): 157/72 Accompanied By: alone Schedule Follow-up Appointment: Yes Clinical Summary of Care: Patient Declined Electronic Signature(s) Signed: 03/22/2019 5:59:01 PM By: Levan Hurst RN, BSN Entered By: Levan Hurst on 03/19/2019 16:39:47 -------------------------------------------------------------------------------- Lower Extremity Assessment Details Patient Name: Date of Service: [name], [name] 03/19/2019 12:30 PM Medical Record [id number] Patient Account Number: [id number] Date of Birth/Sex: Treating RN: 01-18-1955 (64 y.o. [name] Primary Care Jamelle Noy: SYSTEM, PCP Other Clinician: Referring Torian Quintero: Treating Avondre Richens/Extender:[name], [name] in Treatment: 5 Edema Assessment Assessed: [Left: Yes] [Right: No] Edema: [Left: Ye] [Right: s] Calf Left: Right: Point of Measurement: 42 cm From Medial Instep 46 cm cm Ankle Left: Right: Point of Measurement: 13 cm From Medial Instep 25.5 cm cm Vascular Assessment Pulses: Dorsalis Pedis Palpable: [Left:Yes] Electronic Signature(s) Signed: 03/19/2019 5:34:19 PM By: Deon Pilling Entered By: Deon Pilling on 03/19/2019 12:59:58 -------------------------------------------------------------------------------- Multi Wound Chart Details Patient Name: Date of Service: [name], [name] 03/19/2019 12:30 PM Medical Record [id number] Patient Account Number: [id number] Date of Birth/Sex: Treating RN: 1955-03-10 (64 y.o. [name] Primary Care Shawntez Dickison: SYSTEM, PCP Other Clinician: Referring Tadeo Besecker: Treating Kholton Coate/Extender:[name], [name] in Treatment: 5 Vital Signs Height(in): 74 Pulse(bpm): 66 Weight(lbs): 238 Blood Pressure(mmHg): 157/72 Body Mass  Index(BMI): 31 Temperature(F): 98.3 Respiratory 19 Rate(breaths/min): Photos: [1:No Photos] [2:No Photos] [3:No Photos] Wound Location: [1:Left Foot - Plantar] [2:Left Toe Great] [3:Left Toe Second] Wounding Event: [1:Blister] [2:Blister] [3:Blister] Primary Etiology: [1:Diabetic Wound/Ulcer of the Diabetic Wound/Ulcer of the Diabetic Wound/Ulcer of the Lower Extremity] [2:Lower Extremity] [3:Lower Extremity] Comorbid History: [1:Hypertension, Type II Diabetes] [2:Hypertension, Type II Diabetes] [3:Hypertension, Type II Diabetes] Date Acquired: [1:01/14/2017] [2:03/17/2019] [3:03/16/2019] Weeks of Treatment: [1:5] [2:0] [3:0] Wound Status: [1:Open] [2:Open] [3:Open] Measurements L x W x D 0.5x0.3x0.3 [2:1.8x2.5x0.1] [3:3.9x2x0.2] (cm) Area (cm) : [1:0.118] [2:3.534] [3:6.126] Volume (cm) : [1:0.035] [2:0.353] [3:1.225] % Reduction in Area: [1:24.80%] [2:N/A] [3:N/A] % Reduction in Volume: 44.40% [2:N/A] [  3:N/A] Classification: [1:Grade 3] [2:Grade 2] [3:Grade 2] Exudate Amount: [1:Medium] [2:Medium] [3:Medium] Exudate Type: [1:Serosanguineous] [2:Serosanguineous] [3:Serosanguineous] Exudate Color: [1:red, brown] [2:red, brown] [3:red, brown] Wound Margin: [1:Flat and Intact] [2:N/A] [3:N/A] Granulation Amount: [1:Large (67-100%)] [2:Large (67-100%)] [3:Large (67-100%)] Granulation Quality: [1:Pink, Pale] [2:Red] [3:Red] Necrotic Amount: [1:None Present (0%)] [2:N/A] [3:None Present (0%)] Exposed Structures: [1:Fat Layer (Subcutaneous Fat Layer (Subcutaneous Fat Layer (Subcutaneous Tissue) Exposed: Yes Fascia: No Tendon: No Muscle: No Joint: No Bone: No] [2:Tissue) Exposed: Yes Fascia: No Tendon: No Muscle: No Joint: No Bone: No] [3:Tissue) Exposed: Yes  Fascia: No Tendon: No Muscle: No Joint: No Bone: No] Epithelialization: [1:Medium (34-66%)] [2:None] [3:None] Debridement: [1:Debridement - Excisional N/A] [3:N/A] Pre-procedure [1:13:20] [2:N/A] [3:N/A] Verification/Time  Out Taken: Pain Control: [1:Other] [2:N/A] [3:N/A] Tissue Debrided: [1:Subcutaneous, Slough] [2:N/A] [3:N/A] Level: [1:Skin/Subcutaneous Tissue] [2:N/A] [3:N/A] Debridement Area (sq cm):0.15 [2:N/A] [3:N/A] Instrument: [1:Curette] [2:N/A] [3:N/A] Bleeding: [1:Minimum] [2:N/A] [3:N/A] Hemostasis Achieved: [1:Pressure] [2:N/A] [3:N/A] Procedural Pain: [1:0] [2:N/A] [3:N/A] Post Procedural Pain: [1:0] [2:N/A] [3:N/A] Debridement Treatment Procedure was tolerated [2:N/A] [3:N/A] Response: [1:well] Post Debridement [1:0.5x0.3x0.3] [2:N/A] [3:N/A] Measurements L x W x D (cm) Post Debridement [1:0.035] [2:N/A] [3:N/A] Volume: (cm) Procedures Performed: Debridement [2:N/A] [3:N/A] Treatment Notes Electronic Signature(s) Signed: 03/19/2019 5:21:33 PM By: [name] MD Signed: 03/19/2019 5:33:53 PM By: Baruch Gouty RN, BSN Entered By: [name] on 03/19/2019 13:29:07 -------------------------------------------------------------------------------- Multi-Disciplinary Care Plan Details Patient Name: Date of Service: [name], [name] 03/19/2019 12:30 PM Medical Record [id number] Patient Account Number: [id number] Date of Birth/Sex: Treating RN: [birth year] (64 y.o. [name] Primary Care Cristela Stalder: SYSTEM, PCP Other Clinician: Referring Sladen Plancarte: Treating Milcah Dulany/Extender:[name], [name] in Treatment: 5 Active Inactive Nutrition Nursing Diagnoses: Impaired glucose control: actual or potential Potential for alteratiion in Nutrition/Potential for imbalanced nutrition Goals: Patient/caregiver agrees to and verbalizes understanding of need to use nutritional supplements and/or vitamins as prescribed Date Initiated: 02/08/2019 Date Inactivated: 02/25/2019 Target Resolution Date: 03/12/2019 Goal Status: Met Patient/caregiver will maintain therapeutic glucose control Date Initiated: 02/08/2019 Target Resolution Date: 04/16/2019 Goal Status:  Active Interventions: Assess HgA1c results as ordered upon admission and as needed Assess patient nutrition upon admission and as needed per policy Provide education on elevated blood sugars and impact on wound healing Provide education on nutrition Treatment Activities: Education provided on Nutrition : 02/08/2019 Notes: Wound/Skin Impairment Nursing Diagnoses: Impaired tissue integrity Knowledge deficit related to ulceration/compromised skin integrity Goals: Patient/caregiver will verbalize understanding of skin care regimen Date Initiated: 02/08/2019 Date Inactivated: 02/25/2019 Target Resolution Date: 03/12/2019 Goal Status: Met Ulcer/skin breakdown will have a volume reduction of 30% by week 4 Date Initiated: 02/08/2019 Target Resolution Date: 04/16/2019 Goal Status: Active Interventions: Assess patient/caregiver ability to obtain necessary supplies Assess patient/caregiver ability to perform ulcer/skin care regimen upon admission and as needed Assess ulceration(s) every visit Provide education on ulcer and skin care Notes: Electronic Signature(s) Signed: 03/19/2019 5:33:53 PM By: Baruch Gouty RN, BSN Entered By: Baruch Gouty on 03/19/2019 13:13:57 -------------------------------------------------------------------------------- Pain Assessment Details Patient Name: Date of Service: [name], [name] 03/19/2019 12:30 PM Medical Record [id number] Patient Account Number: [id number] Date of Birth/Sex: Treating RN: June 24, 1955 (64 y.o. [name] Primary Care Maurio Baize: SYSTEM, PCP Other Clinician: Referring Linley Moxley: Treating Duyen Beckom/Extender:[name], [name] in Treatment: 5 Active Problems Location of Pain Severity and Description of Pain Patient Has Paino Yes Site Locations Pain Location: Generalized Pain, Pain in Ulcers Rate the pain. Current Pain Level: 7 Worst Pain Level: 10 Least Pain Level: 0 Tolerable Pain Level: 8 Pain Management and  Medication Current Pain Management:  Medication: No Cold Application: No Rest: No Massage: No Activity: No T.E.N.S.: No Heat Application: No Leg drop or elevation: No Is the Current Pain Management Adequate: Adequate How does your wound impact your activities of daily livingo Sleep: No Bathing: No Appetite: No Relationship With Others: No Bladder Continence: No Emotions: No Bowel Continence: No Work: No Toileting: No Drive: No Dressing: No Hobbies: No Electronic Signature(s) Signed: 03/19/2019 5:34:19 PM By: Deon Pilling Entered By: Deon Pilling on 03/19/2019 12:59:46 -------------------------------------------------------------------------------- Patient/Caregiver Education Details Patient Name: Date of Service: [name] 3/5/2021andnbsp12:30 PM Medical Record [id number] Patient Account Number: [id number] Date of Birth/Gender: 1955-06-26 (63 y.o. M) Treating RN: Baruch Gouty Primary Care Physician: SYSTEM, PCP Other Clinician: Referring Physician: Treating Physician/Extender:[name], [name] in Treatment: 5 Education Assessment Education Provided To: Patient Education Topics Provided Elevated Blood Sugar/ Impact on Healing: Methods: Explain/Verbal Responses: Reinforcements needed, State content correctly Offloading: Methods: Explain/Verbal Responses: Reinforcements needed, State content correctly Wound/Skin Impairment: Methods: Explain/Verbal Responses: Reinforcements needed, State content correctly Electronic Signature(s) Signed: 03/19/2019 5:33:53 PM By: Baruch Gouty RN, BSN Entered By: Baruch Gouty on 03/19/2019 13:14:23 -------------------------------------------------------------------------------- Wound Assessment Details Patient Name: Date of Service: [name], [name] 03/19/2019 12:30 PM Medical Record [id number] Patient Account Number: [id number] Date of Birth/Sex: Treating RN: November 09, 1955 (64 y.o. M) Primary Care  Ginevra Tacker: SYSTEM, PCP Other Clinician: Referring Miller Edgington: Treating Julisa Flippo/Extender:[name], [name] in Treatment: 5 Wound Status Wound Number: 1 Primary Diabetic Wound/Ulcer of the Lower Etiology: Extremity Wound Location: Left Foot - Plantar Wound Status: Open Wounding Event: Blister Comorbid Hypertension, Type II Diabetes Date Acquired: 01/14/2017 History: Weeks Of Treatment: 5 Clustered Wound: No Photos Wound Measurements Length: (cm) 0.5 Width: (cm) 0.3 Depth: (cm) 0.3 Area: (cm) 0.118 Volume: (cm) 0.035 Wound Description Classification: Grade 3 Wound Margin: Flat and Intact Exudate Amount: Medium Exudate Type: Serosanguineous Exudate Color: red, brown Wound Bed Granulation Amount: Large (67-100%) Granulation Quality: Pink, Pale Necrotic Amount: None Present (0%) or After Cleansing: No Fibrino No Exposed Structure posed: No (Subcutaneous Tissue) Exposed: Yes posed: No posed: No osed: No sed: No % Reduction in Area: 24.8% % Reduction in Volume: 44.4% Epithelialization: Medium (34-66%) Tunneling: No Undermining: No Foul Od Slough/ Fascia Ex Fat Layer Tendon Ex Muscle Ex Joint Exp Bone Expo Treatment Notes Wound #1 (Left, Plantar Foot) 1. Cleanse With Wound Cleanser 3. Primary Dressing Applied Calcium Alginate Ag 4. Secondary Dressing Dry Gauze Roll Gauze Foam 5. Secured With Tape 7. Footwear/Offloading device applied Wedge shoe Electronic Signature(s) Signed: 03/29/2019 5:11:29 PM By: [name] EMT/HBOT Previous Signature: 03/19/2019 5:34:19 PM Version By: Deon Pilling Entered By: [name] on 03/29/2019 15:31:51 -------------------------------------------------------------------------------- Wound Assessment Details Patient Name: Date of Service: [name], [name] 03/19/2019 12:30 PM Medical Record [id number] Patient Account Number: [id number] Date of Birth/Sex: Treating RN: 24-Apr-1955 (64 y.o. M) Primary Care  Joylene Wescott: SYSTEM, PCP Other Clinician: Referring Horacio Werth: Treating Ranard Harte/Extender:[name], [name] in Treatment: 5 Wound Status Wound Number: 2 Primary Diabetic Wound/Ulcer of the Lower Etiology: Extremity Wound Location: Left Toe Great Wound Status: Open Wounding Event: Blister Comorbid Hypertension, Type II Diabetes Date Acquired: 03/17/2019 History: Weeks Of Treatment: 0 Clustered Wound: No Photos Wound Measurements Length: (cm) 1.8 % Reducti Width: (cm) 2.5 % Reducti Depth: (cm) 0.1 Epithelia Area: (cm) 3.534 Tunnelin Volume: (cm) 0.353 Undermin Wound Description Classification: Grade 2 Exudate Amount: Medium Exudate Type: Serosanguineous Exudate Color: red, brown Wound Bed Granulation Amount: Large (67-100%) Granulation Quality: Red Foul Odor After Cleansing: No Slough/Fibrino No Exposed Structure Fascia Exposed: No Fat Layer (Subcutaneous Tissue) Exposed:  Yes Tendon Exposed: No Muscle Exposed: No Joint Exposed: No Bone Exposed: No on in Area: 0% on in Volume: 0% lization: None g: No ing: No Treatment Notes Wound #2 (Left Toe Great) 1. Cleanse With Wound Cleanser 3. Primary Dressing Applied Calcium Alginate Ag 4. Secondary Dressing Dry Gauze Roll Gauze Foam 5. Secured With Tape 7. Footwear/Offloading device applied Wedge shoe Electronic Signature(s) Signed: 03/29/2019 5:11:29 PM By: [name] EMT/HBOT Previous Signature: 03/19/2019 5:34:19 PM Version By: Deon Pilling Entered By: [name] on 03/29/2019 15:35:22 -------------------------------------------------------------------------------- Wound Assessment Details Patient Name: Date of Service: [name], [name] 03/19/2019 12:30 PM Medical Record [id number] Patient Account Number: [id number] Date of Birth/Sex: Treating RN: [birth year] (64 y.o. M) Primary Care Greggory Safranek: SYSTEM, PCP Other Clinician: Referring Davan Nawabi: Treating Lachina Salsberry/Extender:[name], [name] in  Treatment: 5 Wound Status Wound Number: 3 Primary Diabetic Wound/Ulcer of the Lower Etiology: Extremity Wound Location: Left Toe Second Wound Status: Open Wounding Event: Blister Comorbid Hypertension, Type II Diabetes Date Acquired: 03/16/2019 History: Weeks Of Treatment: 0 Clustered Wound: No Photos Wound Measurements Length: (cm) 3.9 % Reduction in Ar Width: (cm) 2 % Reduction in Vo Depth: (cm) 0.2 Epithelialization Area: (cm) 6.126 Tunneling: Volume: (cm) 1.225 Undermining: Wound Description Classification: Grade 2 Foul Odor After C Exudate Amount: Medium Slough/Fibrino Exudate Type: Serosanguineous Exudate Color: red, brown Wound Bed Granulation Amount: Large (67-100%) Granulation Quality: Red Fascia Exposed: Necrotic Amount: None Present (0%) Fat Layer (Subcut Tendon Exposed: Muscle Exposed: Joint Exposed: Bone Exposed: leansing: No No Exposed Structure No aneous Tissue) Exposed: Yes No No No No ea: 0% lume: 0% : None No No Treatment Notes Wound #3 (Left Toe Second) 1. Cleanse With Wound Cleanser 3. Primary Dressing Applied Calcium Alginate Ag 4. Secondary Dressing Dry Gauze Roll Gauze Foam 5. Secured With Tape 7. Footwear/Offloading device applied Wedge shoe Electronic Signature(s) Signed: 03/29/2019 5:11:29 PM By: [name] EMT/HBOT Previous Signature: 03/19/2019 5:34:19 PM Version By: Deon Pilling Entered By: [name] on 03/29/2019 15:35:51 -------------------------------------------------------------------------------- Vitals Details Patient Name: Date of Service: [name], [name] 03/19/2019 12:30 PM Medical Record [id number] Patient Account Number: [id number] Date of Birth/Sex: Treating RN: Jan 24, 1955 (64 y.o. [name] Primary Care Geniene List: SYSTEM, PCP Other Clinician: Referring Audria Takeshita: Treating Breslyn Abdo/Extender:[name], [name] in Treatment: 5 Vital Signs Time Taken: 12:54 Temperature (F):  98.3 Height (in): 74 Pulse (bpm): 79 Weight (lbs): 238 Respiratory Rate (breaths/min): 19 Body Mass Index (BMI): 30.6 Blood Pressure (mmHg): 157/72 Reference Range: 80 - 120 mg / dl Electronic Signature(s) Signed: 03/19/2019 5:34:19 PM By: Deon Pilling Entered By: Deon Pilling on 03/19/2019 12:59:31

## 2019-03-22 NOTE — Progress Notes (Addendum)
[name], [name] ([id number]) Visit Report for 03/22/2019 HBO Details Patient Name: Date of Service: [name], [name] 03/22/2019 10:00 AM Medical Record [id number] Patient Account Number: [id number] Date of Birth/Sex: Treating RN: December 29, 1955 (64 y.o. [name] Primary Care [name]: SYSTEM, PCP Other Clinician: [name] [name] Referring [name]: Treating [name]/Extender:[name] in Treatment: 6 HBO Treatment Course Details Treatment Course Number: 1 Ordering [name]: [name] Total Treatments Ordered: 40 HBO Treatment Start Date: 03/01/2019 HBO Indication: Diabetic Ulcer(s) of the Lower Extremity HBO Treatment Details Treatment Number: 10 Patient Type: Outpatient Chamber Type: Monoplace Chamber Serial #: [id number] Treatment Protocol: 2.5 ATA with 90 minutes oxygen, with two 5 minute air breaks Treatment Details Compression Rate Down: 2.0 psi / minute De-Compression Rate Up: 2.0 psi / minute Air breaks and CompressTx Pressure breathing periods DecompressDecompress Begins Reached (leave unused spaces Begins Ends blank) Chamber Pressure (ATA)1 2.5 2.5 2.5 2.5 2.5 --2.5 1 Clock Time (24 hr) 09:46 09:58 10:2810:3311:0311:08--11:38 11:50 Treatment Length: 124 (minutes) Treatment Segments: 4 Vital Signs Capillary Blood Glucose Reference Range: 80 - 120 mg / dl HBO Diabetic Blood Glucose Intervention Range: <131 mg/dl or >249 mg/dl Time Vitals Blood Respiratory Capillary Blood Glucose Pulse Action Type: Pulse: Temperature: Taken: Pressure: Rate: Glucose (mg/dl): Meter #: Oximetry (%) Taken: Pre 09:25 103/78 83 19 98.2 265 Post 11:52 129/55 70 20 98.5 229 Treatment Response Treatment Toleration: Well Treatment Completion Treatment Completed without Adverse Event Status: Additional Procedure Documentation Tissue Sevierity: Necrosis of bone [name] Notes No concerns with treatment given Physician HBO Attestation: I certify that I supervised  this HBO treatment in accordance with Medicare guidelines. A trained Yes emergency response team is readily available per hospital policies and procedures. Continue HBOT as ordered. Yes Electronic Signature(s) Signed: 03/22/2019 5:29:15 PM By: Darrell Ham MD Previous Signature: 03/22/2019 4:54:41 PM Version By: [name] EMT/HBOT Entered By: [name] on 03/22/2019 17:26:08 -------------------------------------------------------------------------------- HBO Safety Checklist Details Patient Name: Date of Service: [name], [name] 03/22/2019 10:00 AM Medical Record [id number] Patient Account Number: [id number] Date of Birth/Sex: Treating RN: [birth year] (64 y.o. [name] Primary Care [name]: SYSTEM, PCP Other Clinician: [name] [name] Referring [name]: Treating [name]/Extender:[name] in Treatment: 6 HBO Safety Checklist Items Safety Checklist Consent Form Signed Patient voided / foley secured and emptied When did you last eato 0800 - baked fish Last dose of injectable or oral agent insulin NA Ostomy pouch emptied and vented if applicable NA All implantable devices assessed, documented and approved NA Intravenous access site secured and place Valuables secured Linens and cotton and cotton/polyester blend (less than 51% polyester) Personal oil-based products / skin lotions / body lotions removed NA Wigs or hairpieces removed NA Smoking or tobacco materials removed Books / newspapers / magazines / loose paper removed Cologne, aftershave, perfume and deodorant removed Jewelry removed (may wrap wedding band) NA Make-up removed Hair care products removed NA Battery operated devices (external) removed NA Heating patches and chemical warmers removed NA Titanium eyewear removed NA Nail polish cured greater than 10 hours NA Casting material cured greater than 10 hours NA Hearing aids removed NA Loose dentures or partials removed NA  Prosthetics have been removed Patient demonstrates correct use of air break device (if applicable) Patient concerns have been addressed Patient grounding bracelet on and cord attached to chamber Specifics for Inpatients (complete in addition to above) Medication sheet sent with patient Intravenous medications needed or due during therapy sent with patient Drainage tubes (e.g. nasogastric tube or chest tube secured and vented) Endotracheal or Tracheotomy  tube secured Cuff deflated of air and inflated with saline Airway suctioned Electronic Signature(s) Signed: 03/22/2019 10:17:59 AM By: [name] EMT/HBOT Entered By: [name] on 03/22/2019 10:17:58

## 2019-03-22 NOTE — Progress Notes (Signed)
[name], [name] ([id number]) Visit Report for 03/22/2019 Arrival Information Details Patient Name: Date of Service: [name], [name] 03/22/2019 10:00 AM Medical Record [id number] Patient Account Number: [id number] Date of Birth/Sex: Treating RN: 1955-01-27 (64 y.o. [name] Primary Care Kayci Belleville: SYSTEM, PCP Other Clinician: [name] Referring Sinaya Minogue: Treating Jalina Blowers/Extender:[name], [name] in Treatment: 6 Visit Information History Since Last Visit Added or deleted any medications: No Patient Arrived: Ambulatory Any new allergies or adverse reactions: No Arrival Time: 09:20 Had a fall or experienced change in No Accompanied By: self activities of daily living that may affect Transfer Assistance: None risk of falls: Patient Identification Verified: Yes Signs or symptoms of abuse/neglect since last No Secondary Verification Process Yes visito Completed: Hospitalized since last visit: No Patient Requires Transmission-Based No Implantable device outside of the clinic excluding No Precautions: cellular tissue based products placed in the center Patient Has Alerts: No since last visit: Pain Present Now: No Electronic Signature(s) Signed: 03/22/2019 4:54:41 PM By: [name] EMT/HBOT Entered By: [name] on 03/22/2019 10:16:38 -------------------------------------------------------------------------------- Encounter Discharge Information Details Patient Name: Date of Service: [name], [name] 03/22/2019 10:00 AM Medical Record [id number] Patient Account Number: [id number] Date of Birth/Sex: Treating RN: 15-May-1955 (64 y.o. [name] Primary Care Alika Eppes: SYSTEM, PCP Other Clinician: [name] Referring Kenyatta Gloeckner: Treating Alexei Ey/Extender:[name], [name] in Treatment: 6 Encounter Discharge Information Items Discharge Condition: Stable Ambulatory Status: Ambulatory Discharge Destination: Home Transportation:  Private Auto Accompanied By: self Schedule Follow-up Appointment: Yes Clinical Summary of Care: Patient Declined Electronic Signature(s) Signed: 03/22/2019 4:54:41 PM By: [name] EMT/HBOT Entered By: [name] on 03/22/2019 13:35:02 -------------------------------------------------------------------------------- Patient/Caregiver Education Details Patient Name: [name] 3/8/2021andnbsp10:00 Date of Service: AM Medical Record [id number] Number: Patient Account Number: [id number] Treating RN: 20-Sep-1955 (64 y.o. Levan Hurst Date of Birth/Gender: M) Other Clinician: [name] Primary Care Physician: SYSTEM, PCP Treating [name] Referring Physician: Physician/Extender: [name] in Treatment: 6 Education Assessment Education Provided To: Patient Education Topics Provided Hyperbaric Oxygenation: Methods: Explain/Verbal Responses: State content correctly Electronic Signature(s) Signed: 03/22/2019 4:54:41 PM By: [name] EMT/HBOT Entered By: [name] on 03/22/2019 13:34:51 -------------------------------------------------------------------------------- Vitals Details Patient Name: Date of Service: [name], [name] 03/22/2019 10:00 AM Medical Record [id number] Patient Account Number: [id number] Date of Birth/Sex: Treating RN: 1955-04-03 (64 y.o. [name] Primary Care Will Schier: SYSTEM, PCP Other Clinician: [name] Referring Fortune Torosian: Treating Marvie Calender/Extender:[name], [name] in Treatment: 6 Vital Signs Time Taken: 09:25 Temperature (F): 98.2 Height (in): 74 Pulse (bpm): 83 Weight (lbs): 238 Respiratory Rate (breaths/min): 19 Body Mass Index (BMI): 30.6 Blood Pressure (mmHg): 103/78 Capillary Blood Glucose (mg/dl): 265 Reference Range: 80 - 120 mg / dl Electronic Signature(s) Signed: 03/22/2019 4:54:41 PM By: [name] EMT/HBOT Entered By: [name] on 03/22/2019 10:17:11

## 2019-03-23 ENCOUNTER — Encounter (HOSPITAL_BASED_OUTPATIENT_CLINIC_OR_DEPARTMENT_OTHER): Payer: Medicaid Other | Admitting: Internal Medicine

## 2019-03-23 ENCOUNTER — Other Ambulatory Visit: Payer: Self-pay

## 2019-03-23 DIAGNOSIS — E11621 Type 2 diabetes mellitus with foot ulcer: Secondary | ICD-10-CM | POA: Diagnosis not present

## 2019-03-23 LAB — GLUCOSE, CAPILLARY
Glucose-Capillary: 137 mg/dL — ABNORMAL HIGH (ref 70–99)
Glucose-Capillary: 117 mg/dL — ABNORMAL HIGH (ref 70–99)

## 2019-03-23 NOTE — Progress Notes (Signed)
KELLIE, CHISOLM ([id number]) Visit Report for 03/23/2019 SuperBill Details Patient Name: Date of Service: [name], [name] 03/23/2019 Medical Record [id number] Patient Account Number: [id number] Date of Birth/Sex: Treating RN: January 28, 1955 (63 y.o. 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, left ankle and foot E11.621 Type 2 diabetes mellitus with foot ulcer Facility Procedures CPT4 Description Modifier Quantity Code 18984210 99183-Physician attendance and supervision of hyperbaric oxygen therapy, per 1 session ICD-10 Diagnosis Description E11.621 Type 2 diabetes mellitus with foot ulcer Physician Procedures CPT4 Code Description Modifier Quantity 3128118 86773 - WC PHYS HYPERBARIC OXYGEN THERAPY 1 ICD-10 Diagnosis Description E11.621 Type 2 diabetes mellitus with foot ulcer Electronic Signature(s) Signed: 03/23/2019 3:29:05 PM By: [name] EMT/HBOT Signed: 03/23/2019 5:48:31 PM By: [name] MD Entered By: [name] on 03/23/2019 13:15:15

## 2019-03-23 NOTE — Progress Notes (Signed)
FORDYCE, LEPAK ([id number]) Visit Report for 03/23/2019 Arrival Information Details Patient Name: Date of Service: [name], [name] 03/23/2019 10:00 AM Medical Record [id number] Patient Account Number: [id number] Date of Birth/Sex: Treating RN: 05-22-1955 (63 y.o. [name]) Carlene Coria Primary Care Berta Denson: SYSTEM, PCP Other Clinician: [name] Referring Akeia Perot: Treating Camiyah Friberg/Extender:[name], [name] in Treatment: 6 Visit Information History Since Last Visit Added or deleted any medications: No Patient Arrived: Ambulatory Any new allergies or adverse reactions: No Arrival Time: 09:30 Had a fall or experienced change in No Accompanied By: self activities of daily living that may affect Transfer Assistance: None risk of falls: Patient Identification Verified: Yes Signs or symptoms of abuse/neglect since last No Secondary Verification Process Yes visito Completed: Hospitalized since last visit: No Patient Requires Transmission-Based No Implantable device outside of the clinic excluding No Precautions: cellular tissue based products placed in the center Patient Has Alerts: No since last visit: Pain Present Now: No Electronic Signature(s) Signed: 03/23/2019 3:29:05 PM By: [name] EMT/HBOT Entered By: [name] on 03/23/2019 10:36:13 -------------------------------------------------------------------------------- Encounter Discharge Information Details Patient Name: Date of Service: [name], [name] 03/23/2019 10:00 AM Medical Record [id number] Patient Account Number: [id number] Date of Birth/Sex: Treating RN: [birth year] (63 y.o. [name]) Carlene Coria Primary Care Charlee Whitebread: SYSTEM, PCP Other Clinician: [name] Referring Caasi Giglia: Treating Lakeia Bradshaw/Extender:[name], [name] in Treatment: 6 Encounter Discharge Information Items Discharge Condition: Stable Ambulatory Status: Ambulatory Discharge Destination: Home Transportation: Private  Auto Accompanied By: self Schedule Follow-up Appointment: Yes Clinical Summary of Care: Patient Declined Electronic Signature(s) Signed: 03/23/2019 3:29:05 PM By: [name] EMT/HBOT Entered By: [name] on 03/23/2019 13:16:14 -------------------------------------------------------------------------------- Patient/Caregiver Education Details Patient Name: [name] 3/9/2021andnbsp10:00 Date of Service: AM Medical Record [id number] Number: Patient Account Number: [id number] Treating RN: 04-16-1955 (64 y.o. Carlene Coria Date of Birth/Gender: M) Other Clinician: [name] Primary Care Physician: SYSTEM, PCP Treating [name] Referring Physician: Physician/Extender: [name] in Treatment: 6 Education Assessment Education Provided To: Patient Education Topics Provided Hyperbaric Oxygenation: Methods: Explain/Verbal Responses: State content correctly Electronic Signature(s) Signed: 03/23/2019 3:29:05 PM By: [name] EMT/HBOT Entered By: [name] on 03/23/2019 13:16:03 -------------------------------------------------------------------------------- Vitals Details Patient Name: Date of Service: [name], [name] 03/23/2019 10:00 AM Medical Record [id number] Patient Account Number: [id number] Date of Birth/Sex: Treating RN: [birth year] (63 y.o. [name]) Carlene Coria Primary Care Leilah Polimeni: SYSTEM, PCP Other Clinician: [name] Referring Maecy Podgurski: Treating Launi Asencio/Extender:[name], [name] in Treatment: 6 Vital Signs Time Taken: 09:35 Temperature (F): 98 Height (in): 74 Pulse (bpm): 76 Weight (lbs): 238 Respiratory Rate (breaths/min): 16 Body Mass Index (BMI): 30.6 Blood Pressure (mmHg): 122/55 Capillary Blood Glucose (mg/dl): 137 Reference Range: 80 - 120 mg / dl Electronic Signature(s) Signed: 03/23/2019 3:29:05 PM By: [name] EMT/HBOT Entered By: [name] on 03/23/2019 10:36:29

## 2019-03-23 NOTE — Progress Notes (Addendum)
[name] ([id number]) Visit Report for 03/23/2019 HBO Details Patient Name: Date of Service: [name], [name] 03/23/2019 10:00 AM Medical Record [id number] Patient Account Number: [id number] Date of Birth/Sex: Treating RN: 01/10/1956 (63 y.o. [name]) Carlene Coria Primary Care [name]: SYSTEM, PCP Other Clinician: [name] Referring [name]: Treating Donnis Phaneuf/Extender:[name], [name] in Treatment: 6 HBO Treatment Course Details Treatment Course Number: 1 Ordering Alycen Mack: [name] Total Treatments Ordered: 40 HBO Treatment Start Date: 03/01/2019 HBO Indication: Diabetic Ulcer(s) of the Lower Extremity HBO Treatment Details Treatment Number: 11 Patient Type: Outpatient Chamber Type: Monoplace Chamber Serial #: [id number] Treatment Protocol: 2.5 ATA with 90 minutes oxygen, with two 5 minute air breaks Treatment Details Compression Rate Down: 2.0 psi / minute De-Compression Rate Up: 2.0 psi / minute Air breaks and CompressTx Pressure breathing periods DecompressDecompress Begins Reached (leave unused spaces Begins Ends blank) Chamber Pressure (ATA)1 2.5 2.5 2.5 2.5 2.5 --2.5 1 Clock Time (24 hr) 09:46 09:58 10:2810:3311:0311:08--11:38 11:50 Treatment Length: 124 (minutes) Treatment Segments: 4 Vital Signs Capillary Blood Glucose Reference Range: 80 - 120 mg / dl HBO Diabetic Blood Glucose Intervention Range: <131 mg/dl or >249 mg/dl Time Vitals Blood Respiratory Capillary Blood Glucose Pulse Action Type: Pulse: Temperature: Taken: Pressure: Rate: Glucose (mg/dl): Meter #: Oximetry (%) Taken: Pre 09:35 122/55 76 16 98 137 Post 11:52 113/54 75 18 98.4 117 Treatment Response Treatment Toleration: Well Treatment Completion Treatment Completed without Adverse Event Status: Additional Procedure Documentation Tissue Sevierity: Necrosis of bone [name] Notes No concerns with treatment given. A prescription for antibiotic was E scribed and I told the  patient this today Physician HBO Attestation: I certify that I supervised this HBO treatment in accordance with Medicare guidelines. A trained Yes emergency response team is readily available per hospital policies and procedures. Continue HBOT as ordered. Yes Electronic Signature(s) Signed: 03/23/2019 5:48:31 PM By: [name] MD Previous Signature: 03/23/2019 3:29:05 PM Version By: [name] EMT/HBOT Entered By: [name] on 03/23/2019 17:45:02 -------------------------------------------------------------------------------- HBO Safety Checklist Details Patient Name: Date of Service: [name], [name] 03/23/2019 10:00 AM Medical Record [id number] Patient Account Number: [id number] Date of Birth/Sex: Treating RN: 07/24/1955 (63 y.o. [name]) Carlene Coria Primary Care [name]: SYSTEM, PCP Other Clinician: [name] Referring [name]: Treating [name]/Extender:[name], [name] in Treatment: 6 HBO Safety Checklist Items Safety Checklist Consent Form Signed Patient voided / foley secured and emptied When did you last eato 0900 - fried eggs / tomatoes Last dose of injectable or oral agent insulin NA Ostomy pouch emptied and vented if applicable NA All implantable devices assessed, documented and approved NA Intravenous access site secured and place Valuables secured Linens and cotton and cotton/polyester blend (less than 51% polyester) Personal oil-based products / skin lotions / body lotions removed NA Wigs or hairpieces removed NA Smoking or tobacco materials removed Books / newspapers / magazines / loose paper removed Cologne, aftershave, perfume and deodorant removed Jewelry removed (may wrap wedding band) NA Make-up removed Hair care products removed NA Battery operated devices (external) removed NA Heating patches and chemical warmers removed NA Titanium eyewear removed NA Nail polish cured greater than 10 hours NA Casting material cured greater  than 10 hours NA Hearing aids removed NA Loose dentures or partials removed NA Prosthetics have been removed Patient demonstrates correct use of air break device (if applicable) Patient concerns have been addressed Patient grounding bracelet on and cord attached to chamber Specifics for Inpatients (complete in addition to above) Medication sheet sent with patient Intravenous medications needed or due during therapy sent  with patient Drainage tubes (e.g. nasogastric tube or chest tube secured and vented) Endotracheal or Tracheotomy tube secured Cuff deflated of air and inflated with saline Airway suctioned Electronic Signature(s) Signed: 03/23/2019 10:41:04 AM By: [name] EMT/HBOT Entered By: [name] on 03/23/2019 10:41:03

## 2019-03-24 ENCOUNTER — Other Ambulatory Visit: Payer: Self-pay

## 2019-03-24 ENCOUNTER — Encounter (HOSPITAL_BASED_OUTPATIENT_CLINIC_OR_DEPARTMENT_OTHER): Payer: Medicaid Other | Admitting: Physician Assistant

## 2019-03-24 DIAGNOSIS — E11621 Type 2 diabetes mellitus with foot ulcer: Secondary | ICD-10-CM | POA: Diagnosis not present

## 2019-03-24 LAB — GLUCOSE, CAPILLARY
Glucose-Capillary: 154 mg/dL — ABNORMAL HIGH (ref 70–99)
Glucose-Capillary: 137 mg/dL — ABNORMAL HIGH (ref 70–99)

## 2019-03-24 NOTE — Progress Notes (Signed)
[name], [name] ([id number]) Visit Report for 03/24/2019 Problem List Details Patient Name: Date of Service: [name], [name] 03/24/2019 10:00 AM Medical Record [id number] Patient Account Number: [id number] Date of Birth/Sex: Treating RN: [birth year] (64 y.o. 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, left ankle and foot E11.621 Type 2 diabetes mellitus with foot ulcer Facility Procedures CPT4: Description Modifier Quantity Code [id number] 99183-Physician attendance and supervision of hyperbaric oxygen therapy, per 1 session ICD-10 Diagnosis Description  E11.621 Type 2 diabetes mellitus with foot ulcer Physician Procedures CPT4 Code Description: [id number] 19802 - WC PHYS HYPERBARIC OXYGEN THERAPY ICD-10 Diagnosis Description E11.621 Type 2 diabetes mellitus with foot ulcer Modifier: Quantity: 1 Electronic Signature(s) Signed: 03/24/2019 5:27:58 PM By: Worthy Keeler PA-C Previous Signature: 03/24/2019 2:27:25 PM Version By: [name] EMT/HBOT Entered By: Worthy Keeler on 03/24/2019 17:27:57

## 2019-03-24 NOTE — Progress Notes (Addendum)
Darrell, [name] ([id number]) Visit Report for 03/24/2019 HBO Details Patient Name: Date of Service: [name], [name] 03/24/2019 10:00 AM Medical Record [id number] Patient Account Number: [id number] Date of Birth/Sex: Treating RN: [birth year] (64 y.o. [name] Primary Care Mikalyn Hermida: SYSTEM, PCP Other Clinician: [name] Referring Daylah Sayavong: Treating Jonel Weldon/Extender:Stone III, Vance Gather in Treatment: 6 HBO Treatment Course Details Treatment Course Number: 1 Ordering Maveryck Bahri: [name] Total Treatments Ordered: 40 HBO Treatment 03/01/2019 Start Date: HBO Indication: Diabetic Ulcer(s) of the Lower Extremity HBO Treatment 03/24/2019 End Date: HBO Discharge Treatment Series Incomplete; Wound Outcome: Progress Plateaued HBO Treatment Details Treatment Number: 12 Patient Type: Outpatient Chamber Type: Monoplace Chamber Serial #: [id number] Treatment Protocol: 2.5 ATA with 90 minutes oxygen, with two 5 minute air breaks Treatment Details Compression Rate Down: 2.0 psi / minute De-Compression Rate Up: 2.0 psi / minute Air breaks and CompressTx Pressure breathing periods DecompressDecompress Begins Reached (leave unused spaces Begins Ends blank) Chamber Pressure (ATA)1 2.5 2.5 2.5 2.5 2.5 --2.5 1 Clock Time (24 hr) 10:05 10:17 10:4710:5211:2211:27--11:57 12:09 Treatment Length: 124 (minutes) Treatment Segments: 4 Vital Signs Capillary Blood Glucose Reference Range: 80 - 120 mg / dl HBO Diabetic Blood Glucose Intervention Range: <131 mg/dl or >249 mg/dl Time Vitals Blood Respiratory Capillary Blood Glucose Pulse Action Type: Pulse: Temperature: Taken: Pressure: Rate: Glucose (mg/dl): Meter #: Oximetry (%) Taken: Pre 09:50 123/73 76 18 98 154 Post 12:11 139/84 67 15 98.3 137 Treatment Response Treatment Toleration: Well Treatment Completion Treatment Completed without Adverse Event Status: Additional Procedure Documentation Tissue Sevierity: Necrosis  of bone Physician HBO Attestation: I certify that I supervised this HBO treatment in accordance with Medicare guidelines. A trained Yes emergency response team is readily available per hospital policies and procedures. Continue HBOT as ordered. Yes Electronic Signature(s) Signed: 04/21/2019 7:02:55 PM By: Worthy Keeler PA-C Signed: 04/28/2019 9:04:28 AM By: Levan Hurst RN, BSN Previous Signature: 03/24/2019 5:27:54 PM Version By: Worthy Keeler PA-C Previous Signature: 03/24/2019 2:27:25 PM Version By: [name] EMT/HBOT Entered By: Levan Hurst on 04/16/2019 09:25:05 -------------------------------------------------------------------------------- HBO Safety Checklist Details Patient Name: Date of Service: Darrell, [name] 03/24/2019 10:00 AM Medical Record [id number] Patient Account Number: [id number] Date of Birth/Sex: Treating RN: Jul 28, 1955 (64 y.o. [name] Primary Care Margareth Kanner: SYSTEM, PCP Other Clinician: [name] Referring Giselle Brutus: Treating Karmah Potocki/Extender:Stone III, Vance Gather in Treatment: 6 HBO Safety Checklist Items Safety Checklist Consent Form Signed Patient voided / foley secured and emptied When did you last eato 0700 - bacon/egg/toast Last dose of injectable or oral agent insulin NA Ostomy pouch emptied and vented if applicable NA All implantable devices assessed, documented and approved NA Intravenous access site secured and place Valuables secured Linens and cotton and cotton/polyester blend (less than 51% polyester) Personal oil-based products / skin lotions / body lotions removed NA Wigs or hairpieces removed NA Smoking or tobacco materials removed Books / newspapers / magazines / loose paper removed Cologne, aftershave, perfume and deodorant removed Jewelry removed (may wrap wedding band) NA Make-up removed Hair care products removed NA Battery operated devices (external) removed NA Heating patches and chemical  warmers removed NA Titanium eyewear removed NA Nail polish cured greater than 10 hours NA Casting material cured greater than 10 hours NA Hearing aids removed NA Loose dentures or partials removed NA Prosthetics have been removed Patient demonstrates correct use of air break device (if applicable) Patient concerns have been addressed Patient grounding bracelet on and cord attached to chamber Specifics for Inpatients (complete in addition to above)  Medication sheet sent with patient Intravenous medications needed or due during therapy sent with patient Drainage tubes (e.g. nasogastric tube or chest tube secured and vented) Endotracheal or Tracheotomy tube secured Cuff deflated of air and inflated with saline Airway suctioned Electronic Signature(s) Signed: 03/24/2019 10:39:24 AM By: [name] EMT/HBOT Entered By: [name] on 03/24/2019 10:39:24

## 2019-03-24 NOTE — Progress Notes (Signed)
BURKE, TERRY ([id number]) Visit Report for 03/24/2019 Arrival Information Details Patient Name: Date of Service: DAXEN, LANUM 03/24/2019 10:00 AM Medical Record [id number] Patient Account Number: [id number] Date of Birth/Sex: Treating RN: 1955/10/22 (64 y.o. [name] Primary Care Giuseppina Quinones: SYSTEM, PCP Other Clinician: [name] Referring Elsie Sakuma: Treating Amarianna Abplanalp/Extender:Stone III, Vance Gather in Treatment: 6 Visit Information History Since Last Visit Added or deleted any medications: No Patient Arrived: Ambulatory Any new allergies or adverse reactions: No Arrival Time: 09:45 Had a fall or experienced change in No Accompanied By: self activities of daily living that may affect Transfer Assistance: None risk of falls: Patient Identification Verified: Yes Signs or symptoms of abuse/neglect since last No Secondary Verification Process Yes visito Completed: Hospitalized since last visit: No Patient Requires Transmission-Based No Implantable device outside of the clinic excluding No Precautions: cellular tissue based products placed in the center Patient Has Alerts: No since last visit: Pain Present Now: No Electronic Signature(s) Signed: 03/24/2019 2:27:25 PM By: [name] EMT/HBOT Entered By: [name] on 03/24/2019 10:38:12 -------------------------------------------------------------------------------- Encounter Discharge Information Details Patient Name: Date of Service: MONTEE, TALLMAN 03/24/2019 10:00 AM Medical Record [id number] Patient Account Number: [id number] Date of Birth/Sex: Treating RN: 05-21-1955 (64 y.o. [name] Primary Care Adya Wirz: SYSTEM, PCP Other Clinician: [name] Referring Mikkel Charrette: Treating Mandeep Kiser/Extender:Stone III, Vance Gather in Treatment: 6 Encounter Discharge Information Items Discharge Condition: Stable Ambulatory Status: Ambulatory Discharge Destination: Home Transportation:  Private Auto Accompanied By: self Schedule Follow-up Appointment: Yes Clinical Summary of Care: Patient Declined Electronic Signature(s) Signed: 03/24/2019 2:27:25 PM By: [name] EMT/HBOT Entered By: [name] on 03/24/2019 13:31:38 -------------------------------------------------------------------------------- Patient/Caregiver Education Details Patient Name: [name] 3/10/2021andnbsp10:00 Date of Service: AM Medical Record [id number] Number: Patient Account Number: [id number] Treating RN: 1955-02-06 (64 y.o. Baruch Gouty Date of Birth/Gender: M) Other Clinician: [name] Primary Care Treating SYSTEM, PCP Worthy Keeler Physician: Physician/Extender: Referring Physician: [name] in Treatment: 6 Education Assessment Education Provided To: Patient Education Topics Provided Hyperbaric Oxygenation: Methods: Explain/Verbal Responses: State content correctly Electronic Signature(s) Signed: 03/24/2019 2:27:25 PM By: [name] EMT/HBOT Entered By: [name] on 03/24/2019 13:31:24 -------------------------------------------------------------------------------- Vitals Details Patient Name: Date of Service: JAHREE, DERMODY 03/24/2019 10:00 AM Medical Record [id number] Patient Account Number: [id number] Date of Birth/Sex: Treating RN: 1955/08/19 (64 y.o. [name] Primary Care Jaquasha Carnevale: SYSTEM, PCP Other Clinician: [name] Referring Raye Wiens: Treating Sandrina Heaton/Extender:Stone III, Vance Gather in Treatment: 6 Vital Signs Time Taken: 09:50 Temperature (F): 98 Height (in): 74 Pulse (bpm): 76 Weight (lbs): 238 Respiratory Rate (breaths/min): 18 Body Mass Index (BMI): 30.6 Blood Pressure (mmHg): 123/73 Capillary Blood Glucose (mg/dl): 154 Reference Range: 80 - 120 mg / dl Electronic Signature(s) Signed: 03/24/2019 2:27:25 PM By: [name] EMT/HBOT Entered By: [name] on 03/24/2019 10:38:30

## 2019-03-25 ENCOUNTER — Emergency Department (HOSPITAL_BASED_OUTPATIENT_CLINIC_OR_DEPARTMENT_OTHER): Payer: Medicaid Other

## 2019-03-25 ENCOUNTER — Emergency Department (HOSPITAL_COMMUNITY): Payer: Medicaid Other

## 2019-03-25 ENCOUNTER — Other Ambulatory Visit: Payer: Self-pay

## 2019-03-25 ENCOUNTER — Encounter (HOSPITAL_COMMUNITY): Payer: Self-pay

## 2019-03-25 ENCOUNTER — Inpatient Hospital Stay (HOSPITAL_COMMUNITY)
Admission: EM | Admit: 2019-03-25 | Discharge: 2019-03-31 | DRG: 617 | Disposition: A | Discharge status: Home Health | Payer: Medicaid Other | Attending: Family Medicine | Admitting: Family Medicine

## 2019-03-25 ENCOUNTER — Encounter (HOSPITAL_BASED_OUTPATIENT_CLINIC_OR_DEPARTMENT_OTHER): Payer: Medicaid Other | Admitting: Internal Medicine

## 2019-03-25 ENCOUNTER — Encounter (HOSPITAL_BASED_OUTPATIENT_CLINIC_OR_DEPARTMENT_OTHER): Payer: Self-pay

## 2019-03-25 DIAGNOSIS — M1A0720 Idiopathic chronic gout, left ankle and foot, without tophus (tophi): Secondary | ICD-10-CM | POA: Diagnosis not present

## 2019-03-25 DIAGNOSIS — E11621 Type 2 diabetes mellitus with foot ulcer: Secondary | ICD-10-CM

## 2019-03-25 DIAGNOSIS — E1169 Type 2 diabetes mellitus with other specified complication: Principal | ICD-10-CM

## 2019-03-25 DIAGNOSIS — M009 Pyogenic arthritis, unspecified: Secondary | ICD-10-CM | POA: Diagnosis present

## 2019-03-25 DIAGNOSIS — M1990 Unspecified osteoarthritis, unspecified site: Secondary | ICD-10-CM | POA: Diagnosis present

## 2019-03-25 DIAGNOSIS — E1165 Type 2 diabetes mellitus with hyperglycemia: Secondary | ICD-10-CM

## 2019-03-25 DIAGNOSIS — N289 Disorder of kidney and ureter, unspecified: Secondary | ICD-10-CM | POA: Diagnosis present

## 2019-03-25 DIAGNOSIS — I10 Essential (primary) hypertension: Secondary | ICD-10-CM

## 2019-03-25 DIAGNOSIS — L97529 Non-pressure chronic ulcer of other part of left foot with unspecified severity: Secondary | ICD-10-CM

## 2019-03-25 DIAGNOSIS — E1152 Type 2 diabetes mellitus with diabetic peripheral angiopathy with gangrene: Secondary | ICD-10-CM | POA: Diagnosis present

## 2019-03-25 DIAGNOSIS — Z7982 Long term (current) use of aspirin: Secondary | ICD-10-CM

## 2019-03-25 DIAGNOSIS — M86272 Subacute osteomyelitis, left ankle and foot: Secondary | ICD-10-CM | POA: Diagnosis not present

## 2019-03-25 DIAGNOSIS — M7989 Other specified soft tissue disorders: Secondary | ICD-10-CM

## 2019-03-25 DIAGNOSIS — D62 Acute posthemorrhagic anemia: Secondary | ICD-10-CM | POA: Diagnosis not present

## 2019-03-25 DIAGNOSIS — L97509 Non-pressure chronic ulcer of other part of unspecified foot with unspecified severity: Secondary | ICD-10-CM | POA: Diagnosis present

## 2019-03-25 DIAGNOSIS — M869 Osteomyelitis, unspecified: Secondary | ICD-10-CM | POA: Diagnosis not present

## 2019-03-25 DIAGNOSIS — Z79899 Other long term (current) drug therapy: Secondary | ICD-10-CM

## 2019-03-25 DIAGNOSIS — Z9119 Patient's noncompliance with other medical treatment and regimen: Secondary | ICD-10-CM

## 2019-03-25 DIAGNOSIS — Z79891 Long term (current) use of opiate analgesic: Secondary | ICD-10-CM

## 2019-03-25 DIAGNOSIS — Z888 Allergy status to other drugs, medicaments and biological substances status: Secondary | ICD-10-CM

## 2019-03-25 DIAGNOSIS — Z7984 Long term (current) use of oral hypoglycemic drugs: Secondary | ICD-10-CM

## 2019-03-25 DIAGNOSIS — Z8249 Family history of ischemic heart disease and other diseases of the circulatory system: Secondary | ICD-10-CM

## 2019-03-25 DIAGNOSIS — G8929 Other chronic pain: Secondary | ICD-10-CM | POA: Diagnosis present

## 2019-03-25 DIAGNOSIS — L97526 Non-pressure chronic ulcer of other part of left foot with bone involvement without evidence of necrosis: Secondary | ICD-10-CM

## 2019-03-25 DIAGNOSIS — E669 Obesity, unspecified: Secondary | ICD-10-CM | POA: Diagnosis present

## 2019-03-25 DIAGNOSIS — Z794 Long term (current) use of insulin: Secondary | ICD-10-CM | POA: Diagnosis present

## 2019-03-25 DIAGNOSIS — L97424 Non-pressure chronic ulcer of left heel and midfoot with necrosis of bone: Secondary | ICD-10-CM

## 2019-03-25 DIAGNOSIS — Z20822 Contact with and (suspected) exposure to covid-19: Secondary | ICD-10-CM | POA: Diagnosis present

## 2019-03-25 DIAGNOSIS — D638 Anemia in other chronic diseases classified elsewhere: Secondary | ICD-10-CM | POA: Diagnosis present

## 2019-03-25 DIAGNOSIS — M10061 Idiopathic gout, right knee: Secondary | ICD-10-CM | POA: Diagnosis present

## 2019-03-25 DIAGNOSIS — I96 Gangrene, not elsewhere classified: Secondary | ICD-10-CM | POA: Diagnosis present

## 2019-03-25 DIAGNOSIS — Z6830 Body mass index (BMI) 30.0-30.9, adult: Secondary | ICD-10-CM

## 2019-03-25 LAB — MRSA PCR SCREENING: MRSA by PCR: NEGATIVE

## 2019-03-25 LAB — CBC WITH DIFFERENTIAL/PLATELET
Abs Immature Granulocytes: 0.12 10*3/uL — ABNORMAL HIGH (ref 0.00–0.07)
Basophils Absolute: 0 10*3/uL (ref 0.0–0.1)
Basophils Relative: 0 %
Eosinophils Absolute: 0.1 10*3/uL (ref 0.0–0.5)
Eosinophils Relative: 1 %
HCT: 29.8 % — ABNORMAL LOW (ref 39.0–52.0)
Hemoglobin: 10.2 g/dL — ABNORMAL LOW (ref 13.0–17.0)
Immature Granulocytes: 2 %
Lymphocytes Relative: 21 %
Lymphs Abs: 1.6 10*3/uL (ref 0.7–4.0)
MCH: 31.5 pg (ref 26.0–34.0)
MCHC: 34.2 g/dL (ref 30.0–36.0)
MCV: 92 fL (ref 80.0–100.0)
Monocytes Absolute: 0.3 10*3/uL (ref 0.1–1.0)
Monocytes Relative: 5 %
Neutro Abs: 5.4 10*3/uL (ref 1.7–7.7)
Neutrophils Relative %: 71 %
Platelets: 188 10*3/uL (ref 150–400)
RBC: 3.24 MIL/uL — ABNORMAL LOW (ref 4.22–5.81)
RDW: 15.7 % — ABNORMAL HIGH (ref 11.5–15.5)
WBC: 7.6 10*3/uL (ref 4.0–10.5)
nRBC: 0 % (ref 0.0–0.2)

## 2019-03-25 LAB — COMPREHENSIVE METABOLIC PANEL
ALT: 11 U/L (ref 0–44)
AST: 15 U/L (ref 15–41)
Albumin: 3.7 g/dL (ref 3.5–5.0)
Alkaline Phosphatase: 70 U/L (ref 38–126)
Anion gap: 12 (ref 5–15)
BUN: 29 mg/dL — ABNORMAL HIGH (ref 8–23)
CO2: 21 mmol/L — ABNORMAL LOW (ref 22–32)
Calcium: 10.2 mg/dL (ref 8.9–10.3)
Chloride: 102 mmol/L (ref 98–111)
Creatinine, Ser: 1.3 mg/dL — ABNORMAL HIGH (ref 0.61–1.24)
GFR calc Af Amer: >60 mL/min (ref >60)
GFR calc non Af Amer: 59 mL/min — ABNORMAL LOW (ref >60)
Glucose, Bld: 180 mg/dL — ABNORMAL HIGH (ref 70–99)
Potassium: 5.4 mmol/L — ABNORMAL HIGH (ref 3.5–5.1)
Sodium: 135 mmol/L (ref 135–145)
Total Bilirubin: 0.6 mg/dL (ref 0.3–1.2)
Total Protein: 8 g/dL (ref 6.5–8.1)

## 2019-03-25 LAB — SEDIMENTATION RATE: Sed Rate: 81 mm/hr — ABNORMAL HIGH (ref 0–16)

## 2019-03-25 LAB — HEMOGLOBIN A1C
Hgb A1c MFr Bld: 7.2 % — ABNORMAL HIGH (ref 4.8–5.6)
Mean Plasma Glucose: 159.94 mg/dL

## 2019-03-25 LAB — CBG MONITORING, ED: Glucose-Capillary: 159 mg/dL — ABNORMAL HIGH (ref 70–99)

## 2019-03-25 LAB — SARS CORONAVIRUS 2 (TAT 6-24 HRS): SARS Coronavirus 2: NEGATIVE

## 2019-03-25 LAB — LACTIC ACID, PLASMA
Lactic Acid, Venous: 1.4 mmol/L (ref 0.5–1.9)
Lactic Acid, Venous: 0.9 mmol/L (ref 0.5–1.9)

## 2019-03-25 LAB — GLUCOSE, CAPILLARY
Glucose-Capillary: 124 mg/dL — ABNORMAL HIGH (ref 70–99)
Glucose-Capillary: 170 mg/dL — ABNORMAL HIGH (ref 70–99)

## 2019-03-25 LAB — PROTIME-INR
INR: 1.1 (ref 0.8–1.2)
Prothrombin Time: 13.6 seconds (ref 11.4–15.2)

## 2019-03-25 LAB — C-REACTIVE PROTEIN: CRP: 1.9 mg/dL — ABNORMAL HIGH (ref <1.0)

## 2019-03-25 LAB — PREALBUMIN: Prealbumin: 17.6 mg/dL — ABNORMAL LOW (ref 18–38)

## 2019-03-25 LAB — HIV ANTIBODY (ROUTINE TESTING W REFLEX): HIV Screen 4th Generation wRfx: NONREACTIVE

## 2019-03-25 MED ORDER — ALLOPURINOL 300 MG PO TABS
300.0000 mg | ORAL_TABLET | Freq: Every day | ORAL | Status: DC
  Administered 2019-03-25 – 2019-03-31 (×7): 300 mg via ORAL
  Filled 2019-03-25 (×7): qty 1

## 2019-03-25 MED ORDER — CEFAZOLIN SODIUM-DEXTROSE 2-4 GM/100ML-% IV SOLN
2.0000 g | INTRAVENOUS | Status: DC
  Filled 2019-03-25: qty 100

## 2019-03-25 MED ORDER — AMITRIPTYLINE HCL 25 MG PO TABS
25.0000 mg | ORAL_TABLET | Freq: Every day | ORAL | Status: DC
  Administered 2019-03-25 – 2019-03-30 (×6): 25 mg via ORAL
  Filled 2019-03-25 (×6): qty 1

## 2019-03-25 MED ORDER — ACETAMINOPHEN 650 MG RE SUPP: 650.0000 mg | Freq: Four times a day (QID) | RECTAL | Status: DC | PRN

## 2019-03-25 MED ORDER — ASPIRIN EC 81 MG PO TBEC
81.0000 mg | DELAYED_RELEASE_TABLET | Freq: Every day | ORAL | Status: DC
  Administered 2019-03-25 – 2019-03-31 (×7): 81 mg via ORAL
  Filled 2019-03-25 (×7): qty 1

## 2019-03-25 MED ORDER — VANCOMYCIN HCL 1500 MG/300ML IV SOLN: 1500.0000 mg | INTRAVENOUS | Status: DC

## 2019-03-25 MED ORDER — INSULIN ASPART 100 UNIT/ML ~~LOC~~ SOLN
0.0000 [IU] | Freq: Three times a day (TID) | SUBCUTANEOUS | Status: DC
  Administered 2019-03-25: 17:00:00 2 [IU] via SUBCUTANEOUS
  Administered 2019-03-26: 11:00:00 5 [IU] via SUBCUTANEOUS
  Administered 2019-03-26: 17:00:00 3 [IU] via SUBCUTANEOUS
  Administered 2019-03-27: 2 [IU] via SUBCUTANEOUS
  Administered 2019-03-27 – 2019-03-28 (×2): 3 [IU] via SUBCUTANEOUS
  Administered 2019-03-28: 16:00:00 2 [IU] via SUBCUTANEOUS
  Administered 2019-03-28 – 2019-03-29 (×3): 3 [IU] via SUBCUTANEOUS

## 2019-03-25 MED ORDER — CHLORHEXIDINE GLUCONATE 4 % EX LIQD: 60.0000 mL | Freq: Once | CUTANEOUS | Status: DC

## 2019-03-25 MED ORDER — VANCOMYCIN HCL 2000 MG/400ML IV SOLN
2000.0000 mg | Freq: Once | INTRAVENOUS | Status: DC
  Filled 2019-03-25: qty 400

## 2019-03-25 MED ORDER — METOPROLOL SUCCINATE ER 100 MG PO TB24
100.0000 mg | ORAL_TABLET | Freq: Every day | ORAL | Status: DC
  Administered 2019-03-26 – 2019-03-30 (×5): 100 mg via ORAL
  Filled 2019-03-25 (×5): qty 1

## 2019-03-25 MED ORDER — ONDANSETRON HCL 4 MG/2ML IJ SOLN: 4.0000 mg | Freq: Four times a day (QID) | INTRAMUSCULAR | Status: DC | PRN

## 2019-03-25 MED ORDER — INSULIN DETEMIR 100 UNIT/ML ~~LOC~~ SOLN
40.0000 [IU] | Freq: Every day | SUBCUTANEOUS | Status: DC
  Administered 2019-03-25 – 2019-03-28 (×4): 40 [IU] via SUBCUTANEOUS
  Filled 2019-03-25 (×6): qty 0.4

## 2019-03-25 MED ORDER — ONDANSETRON HCL 4 MG PO TABS: 4.0000 mg | ORAL_TABLET | Freq: Four times a day (QID) | ORAL | Status: DC | PRN

## 2019-03-25 MED ORDER — SODIUM CHLORIDE 0.9 % IV BOLUS
500.0000 mL | Freq: Once | INTRAVENOUS | Status: AC
  Administered 2019-03-25: 12:00:00 500 mL via INTRAVENOUS

## 2019-03-25 MED ORDER — ACETAMINOPHEN 325 MG PO TABS: 650.0000 mg | ORAL_TABLET | Freq: Four times a day (QID) | ORAL | Status: DC | PRN

## 2019-03-25 MED ORDER — ENSURE PRE-SURGERY PO LIQD
296.0000 mL | Freq: Once | ORAL | Status: AC
  Administered 2019-03-25: 296 mL via ORAL
  Filled 2019-03-25: qty 296

## 2019-03-25 MED ORDER — FUROSEMIDE 20 MG PO TABS
20.0000 mg | ORAL_TABLET | Freq: Every day | ORAL | Status: DC
  Administered 2019-03-25: 16:00:00 20 mg via ORAL
  Filled 2019-03-25 (×2): qty 1

## 2019-03-25 MED ORDER — ENOXAPARIN SODIUM 40 MG/0.4ML ~~LOC~~ SOLN
40.0000 mg | SUBCUTANEOUS | Status: DC
  Administered 2019-03-25 – 2019-03-30 (×5): 40 mg via SUBCUTANEOUS
  Filled 2019-03-25 (×6): qty 0.4

## 2019-03-25 MED ORDER — AMLODIPINE BESYLATE 5 MG PO TABS
5.0000 mg | ORAL_TABLET | Freq: Every day | ORAL | Status: DC
  Administered 2019-03-26: 10:00:00 5 mg via ORAL
  Filled 2019-03-25: qty 1

## 2019-03-25 MED ORDER — SODIUM CHLORIDE 0.9 % IV SOLN: INTRAVENOUS | Status: DC

## 2019-03-25 MED ORDER — SODIUM CHLORIDE 0.9 % IV SOLN: 2.0000 g | Freq: Three times a day (TID) | INTRAVENOUS | Status: DC

## 2019-03-25 MED ORDER — GABAPENTIN 300 MG PO CAPS
300.0000 mg | ORAL_CAPSULE | Freq: Three times a day (TID) | ORAL | Status: DC
  Administered 2019-03-25 – 2019-03-31 (×18): 300 mg via ORAL
  Filled 2019-03-25 (×18): qty 1

## 2019-03-25 MED ORDER — POVIDONE-IODINE 10 % EX SWAB: 2.0000 "application " | Freq: Once | CUTANEOUS | Status: DC

## 2019-03-25 MED ORDER — OXYCODONE HCL 5 MG PO TABS
10.0000 mg | ORAL_TABLET | Freq: Four times a day (QID) | ORAL | Status: DC | PRN
  Administered 2019-03-25 – 2019-03-30 (×8): 10 mg via ORAL
  Filled 2019-03-25 (×7): qty 2

## 2019-03-25 MED ORDER — LISINOPRIL 40 MG PO TABS
40.0000 mg | ORAL_TABLET | Freq: Every day | ORAL | Status: DC
  Administered 2019-03-26 – 2019-03-29 (×4): 40 mg via ORAL
  Filled 2019-03-25 (×4): qty 1

## 2019-03-25 MED ORDER — INSULIN ASPART 100 UNIT/ML ~~LOC~~ SOLN: 0.0000 [IU] | Freq: Every day | SUBCUTANEOUS | Status: DC

## 2019-03-25 NOTE — Progress Notes (Signed)
Pt refused to sign surgical consent until doctor marks area on foot with marker. Will notify Md.  MRSA swab sent to lab.

## 2019-03-25 NOTE — ED Triage Notes (Signed)
Pt has diabetic wound to bottom of left foot, worsening over the past week, bloody drainage noted in pictures pt took at home this morning. Foot currently wrapped with gauze and sock. Pt reports recent blood sugars in the 130s, takes insulin daily. Ortho doctors want to amputate his foot but pt refuses.

## 2019-03-25 NOTE — Progress Notes (Signed)
VASCULAR LAB PRELIMINARY  PRELIMINARY  PRELIMINARY  PRELIMINARY  Left lower extremity venous duplex completed.    Preliminary report:  See CV proc for preliminary results.   Gave report to Dr. Fleeta Emmer, [facility], RVT 03/25/2019, 12:24 PM

## 2019-03-25 NOTE — Consult Note (Signed)
[facility] for Infectious Disease  Total days of antibiotics 1               Reason for Consult: diabetic foot osteo   Referring Physician: danford  Principal Problem:   Diabetic foot ulcer ([place]) Active Problems:   DM (diabetes mellitus), type 2, uncontrolled ([place])   Essential hypertension, benign    HPI: [name] is a 64 y.o. male with T2DM, DFU/osteomyelitis dating back to 2018. Most recently has had repeated deep tissue debridement to lateral aspect of 1st MTP and plantar aspect but subsequently has been going to [facility] for the past 9-10 weeks. Of late, he has developed hemorrhagic blisters to 1-4th toes that he denies trauma. His wound cultures (unclear if superficial vs deep cultures) are polymicobrial. He wa started on cephalexin and due to ongoing discoloration to his foot and lack of response, he was referred to hospital for evaluation. He is afebrile, denies new trauma to his foot, has sensation but has difficulty with ambulation with his diabetic foot ulcer. Labs show WBC WNL, DVT was ruled out, but xray of left foot suggests evidence of osteomyelitis per my read. In the past he was reticent for surgry however that was several years ago. He has not had recent IV abtx but did receive them early in 2020.  Past Medical History:  Diagnosis Date  . Arthritis   . Back pain   . Diabetes mellitus without complication ([place])   . Hypertension     Allergies:  Allergies  Allergen Reactions  . Other Hives    Anti-snake venim for copperhead venom.  . Pravastatin Other (See Comments)    Leg cramps, elevated CK  . Prednisone Other (See Comments)    Patient states "it runs my blood sugar up so I don't take it."  . Toradol [Ketorolac Tromethamine] Hives and Nausea And Vomiting    Pill form only     MEDICATIONS: . allopurinol  300 mg Oral Daily  . amitriptyline  25 mg Oral QHS  . [START ON 03/26/2019] amLODipine  5 mg Oral Daily  . aspirin EC  81 mg Oral Daily    . enoxaparin (LOVENOX) injection  40 mg Subcutaneous Q24H  . furosemide  20 mg Oral Daily  . gabapentin  300 mg Oral TID  . insulin aspart  0-15 Units Subcutaneous TID WC  . insulin aspart  0-5 Units Subcutaneous QHS  . insulin detemir  40 Units Subcutaneous QHS  . [START ON 03/26/2019] lisinopril  40 mg Oral Daily  . [START ON 03/26/2019] metoprolol succinate  100 mg Oral Daily    Social History   Tobacco Use  . Smoking status: Never Smoker  . Smokeless tobacco: Never Used  Substance Use Topics  . Alcohol use: No  . Drug use: No    Family History  Problem Relation Age of Onset  . Heart failure Mother   . Cancer Father     Review of Systems  Constitutional: Negative for fever, chills, diaphoresis, activity change, appetite change, fatigue and unexpected weight change.  HENT: Negative for congestion, sore throat, rhinorrhea, sneezing, trouble swallowing and sinus pressure.  Eyes: Negative for photophobia and visual disturbance.  Respiratory: Negative for cough, chest tightness, shortness of breath, wheezing and stridor.  Cardiovascular: Negative for chest pain, palpitations and leg swelling.  Gastrointestinal: Negative for nausea, vomiting, abdominal pain, diarrhea, constipation, blood in stool, abdominal distention and anal bleeding.  Genitourinary: Negative for dysuria, hematuria, flank pain and  difficulty urinating.  Musculoskeletal: Negative for myalgias, back pain, joint swelling, arthralgias and gait problem.  Skin: left foot wound Neurological: Negative for dizziness, tremors, weakness and light-headedness.  Hematological: Negative for adenopathy. Does not bruise/bleed easily.  Psychiatric/Behavioral: Negative for behavioral problems, confusion, sleep disturbance, dysphoric mood, decreased concentration and agitation.     OBJECTIVE: Temp:  [97.8 F (36.6 C)-98.1 F (36.7 C)] 97.8 F (36.6 C) (03/11 1443) Pulse Rate:  [60-79] 60 (03/11 1443) Resp:  [16-18] 18  (03/11 1443) BP: (105-134)/(71-84) 130/81 (03/11 1443) SpO2:  [100 %] 100 % (03/11 1443) Physical Exam  Constitutional: He is oriented to person, place, and time. He appears well-developed and well-nourished. No distress.  HENT:  Mouth/Throat: Oropharynx is clear and moist. No oropharyngeal exudate.  Cardiovascular: Normal rate, regular rhythm and normal heart sounds. Exam reveals no gallop and no friction rub.  No murmur heard.  Pulmonary/Chest: Effort normal and breath sounds normal. No respiratory distress. He has no wheezes.  Abdominal: Soft. Bowel sounds are normal. He exhibits no distension. There is no tenderness.  Lymphadenopathy:  He has no cervical adenopathy.  Neurological: He is alert and oriented to person, place, and time.  Skin: discoloration to 3rd digit of left foot. Hemorrhagic bullae to plantar aspect ball of foot, packing with small incision as well. Right great toe deviation Psychiatric: He has a normal mood and affect. His behavior is normal.    LABS: Results for orders placed or performed during the hospital encounter of 03/25/19 (from the past 48 hour(s))  CBG monitoring, ED     Status: Abnormal   Collection Time: 03/25/19 10:12 AM  Result Value Ref Range   Glucose-Capillary 159 (H) 70 - 99 mg/dL    Comment: Glucose reference range applies only to samples taken after fasting for at least 8 hours.  Comprehensive metabolic panel     Status: Abnormal   Collection Time: 03/25/19 10:21 AM  Result Value Ref Range   Sodium 135 135 - 145 mmol/L   Potassium 5.4 (H) 3.5 - 5.1 mmol/L   Chloride 102 98 - 111 mmol/L   CO2 21 (L) 22 - 32 mmol/L   Glucose, Bld 180 (H) 70 - 99 mg/dL    Comment: Glucose reference range applies only to samples taken after fasting for at least 8 hours.   BUN 29 (H) 8 - 23 mg/dL   Creatinine, Ser 1.30 (H) 0.61 - 1.24 mg/dL   Calcium 10.2 8.9 - 10.3 mg/dL   Total Protein 8.0 6.5 - 8.1 g/dL   Albumin 3.7 3.5 - 5.0 g/dL   AST 15 15 - 41 U/L    ALT 11 0 - 44 U/L   Alkaline Phosphatase 70 38 - 126 U/L   Total Bilirubin 0.6 0.3 - 1.2 mg/dL   GFR calc non Af Amer 59 (L) >60 mL/min   GFR calc Af Amer >60 >60 mL/min   Anion gap 12 5 - 15    Comment: Performed at [facility] [address], [place], [place] [address]  Lactic acid, plasma     Status: None   Collection Time: 03/25/19 10:21 AM  Result Value Ref Range   Lactic Acid, Venous 1.4 0.5 - 1.9 mmol/L    Comment: Performed at [facility] [address], [place], [place] [address]  CBC with Differential     Status: Abnormal   Collection Time: 03/25/19 10:21 AM  Result Value Ref Range   WBC 7.6 4.0 - 10.5 K/uL  RBC 3.24 (L) 4.22 - 5.81 MIL/uL   Hemoglobin 10.2 (L) 13.0 - 17.0 g/dL   HCT 29.8 (L) 39.0 - 52.0 %   MCV 92.0 80.0 - 100.0 fL   MCH 31.5 26.0 - 34.0 pg   MCHC 34.2 30.0 - 36.0 g/dL   RDW 15.7 (H) 11.5 - 15.5 %   Platelets 188 150 - 400 K/uL   nRBC 0.0 0.0 - 0.2 %   Neutrophils Relative % 71 %   Neutro Abs 5.4 1.7 - 7.7 K/uL   Lymphocytes Relative 21 %   Lymphs Abs 1.6 0.7 - 4.0 K/uL   Monocytes Relative 5 %   Monocytes Absolute 0.3 0.1 - 1.0 K/uL   Eosinophils Relative 1 %   Eosinophils Absolute 0.1 0.0 - 0.5 K/uL   Basophils Relative 0 %   Basophils Absolute 0.0 0.0 - 0.1 K/uL   Immature Granulocytes 2 %   Abs Immature Granulocytes 0.12 (H) 0.00 - 0.07 K/uL    Comment: Performed at [facility] [address], [place], [place] [address]  Protime-INR     Status: None   Collection Time: 03/25/19 10:21 AM  Result Value Ref Range   Prothrombin Time 13.6 11.4 - 15.2 seconds   INR 1.1 0.8 - 1.2    Comment: (NOTE) INR goal varies based on device and disease states. Performed at [facility], [place] [address], [place], [place] [address]   Lactic acid, plasma     Status: None   Collection Time: 03/25/19 12:23 PM  Result Value Ref Range   Lactic Acid, Venous 0.9 0.5 - 1.9 mmol/L    Comment: Performed at [facility] [address] [place], [place] [address]    MICRO: Organism ID, Bacteria ENTEROBACTER AEROGENES   Resulting Agency [facility]  Susceptibility   Enterobacter aerogenes    MIC    CEFAZOLIN >=64 RESIST... Resistant    CEFEPIME >=32 RESIST... Resistant    CEFTAZIDIME <=1 SENSITIVE  Sensitive    CEFTRIAXONE >=64 RESIST... Resistant    CIPROFLOXACIN 0.5 SENSITIVE  Sensitive    GENTAMICIN <=1 SENSITIVE  Sensitive    IMIPENEM 2 SENSITIVE  Sensitive    PIP/TAZO 8 SENSITIVE  Sensitive    TRIMETH/SULFA <=20 SENSIT... Sensitive       IMAGING: DG Foot Complete Left  Result Date: 03/25/2019 CLINICAL DATA:  Possible foot infection.  Diabetic ulcer EXAM: LEFT FOOT - COMPLETE 3+ VIEW COMPARISON:  03/29/2017 FINDINGS: The first through third MTP joints are widened with sclerosis, spurring, and deformity of the adjacent bone. This is new from prior and new from a report at [facility] in 2020. The regional soft tissues are thickened. No opaque foreign body or fracture. Diffuse arterial calcification with osteopenia. IMPRESSION: Severe arthropathy of the first through third MTP joints that is new from 2020. 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 Electronically Signed   By: Monte Fantasia M.D.   On: 03/25/2019 10:52   VAS [place] LOWER EXTREMITY VENOUS (DVT) (ONLY [facility] & [facility])  Result Date: 03/25/2019  Lower Venous DVTStudy Indications: Diabetic foot ulcer, and Swelling.  Limitations: Edema. Comparison Study: No prior study on file Performing Technologist: Sharion Dove RVS  Examination Guidelines: A complete evaluation includes B-mode imaging, spectral Doppler, color Doppler, and power Doppler as needed of all accessible portions of each vessel. Bilateral testing is considered an integral part of a complete examination. Limited examinations  for reoccurring indications may be performed as noted. The reflux portion of the exam  is performed with the patient in reverse Trendelenburg.  +-----+---------------+---------+-----------+----------+--------------+ RIGHTCompressibilityPhasicitySpontaneityPropertiesThrombus Aging +-----+---------------+---------+-----------+----------+--------------+ CFV  Full           Yes      Yes                                 +-----+---------------+---------+-----------+----------+--------------+   +---------+---------------+---------+-----------+----------+--------------+ LEFT     CompressibilityPhasicitySpontaneityPropertiesThrombus Aging +---------+---------------+---------+-----------+----------+--------------+ CFV      Full           Yes      Yes                                 +---------+---------------+---------+-----------+----------+--------------+ SFJ      Full                                                        +---------+---------------+---------+-----------+----------+--------------+ FV Prox  Full                                                        +---------+---------------+---------+-----------+----------+--------------+ FV Mid   Full                                                        +---------+---------------+---------+-----------+----------+--------------+ FV DistalFull                                                        +---------+---------------+---------+-----------+----------+--------------+ PFV      Full                                                        +---------+---------------+---------+-----------+----------+--------------+ POP      Full           Yes      Yes                                 +---------+---------------+---------+-----------+----------+--------------+ PTV      Full                                                        +---------+---------------+---------+-----------+----------+--------------+ PERO  Not visualized  +---------+---------------+---------+-----------+----------+--------------+     *See table(s) above for measurements and observations.    Preliminary     Assessment/Plan:  64yo M with DM with left foot DFU/osteomyelitis, presumably polymicrobial  - recommend to get ABI to see if that would impact healing if he were to pursue TMA - please have ortho/dr duda see patient for evaluation of left foot osteo and if candidate for transmetatarsal amputation - discussed with patient that the best course would be debulking of infected bone/amputation vs debridement so that can prevent spread to other aspects of foot,in conjunction with antibiotics - recommend to start piptazo and vancomycin - will check MRSA colonization as well - will review his outside records if hx of MRSA as well

## 2019-03-25 NOTE — Progress Notes (Signed)
Pharmacy Antibiotic Note  [name] is a 64 y.o. male admitted on 03/25/2019 with DFI and chronic osteomyelitis.  Pharmacy has been consulted for cefepime and vancomycin dosing.  Plan: Vancomycin 2000 mg IV x 1, then 1500 mg Iv every 24 hours Goal AUC 400-550. Expected AUC: 448 SCr used: 1.3 Cefepime 2 g IV every 8 hours Monitor renal function, Cx and LOT Vancomycin levels at steady state      Temp (24hrs), Avg:98.1 F (36.7 C), Min:98.1 F (36.7 C), Max:98.1 F (36.7 C)  Recent Labs  Lab 03/25/19 1021  WBC 7.6  CREATININE 1.30*  LATICACIDVEN 1.4    CrCl cannot be calculated (Unknown ideal weight.).    Allergies  Allergen Reactions  . Other Hives    Anti-snake venim for copperhead venom.  . Pravastatin Other (See Comments)    Leg cramps, elevated CK  . Prednisone Other (See Comments)    Patient states "it runs my blood sugar up so I don't take it."  . Toradol [Ketorolac Tromethamine] Hives and Nausea And Vomiting    Pill form only    Antimicrobials this admission: Vanc 3/11>> Cefepime 3/11>>  Dose adjustments this admission: n/a  Microbiology results: 3/11 BCx: sent  Bertis Ruddy, PharmD Clinical Pharmacist ED Pharmacist Phone # [phone] 03/25/2019 11:53 AM

## 2019-03-25 NOTE — Consult Note (Signed)
ORTHOPAEDIC CONSULTATION  REQUESTING PHYSICIAN: Edwin Dada, *  Chief Complaint: Chronic ulceration beneath the forefoot with acute gangrenous changes to his toes.  HPI: [name] is a 64 y.o. male who presents with diabetic insensate neuropathy history of gout with chronic ulceration beneath the forefoot.  Patient states he is undergone serial debridements in the office at the wound center he has undergone antibiotic treatment with a PICC line x3 he has undergone hyperbaric oxygen and after his last treatment with hyperbaric oxygen patient states he had acute gangrenous changes to the plantar aspect of his toes and metatarsal heads.  Past Medical History:  Diagnosis Date  . Arthritis   . Back pain   . Diabetes mellitus without complication ([place])   . Hypertension    Past Surgical History:  Procedure Laterality Date  . BACK SURGERY    . CARPAL TUNNEL RELEASE    . CHOLECYSTECTOMY    . FOOT SURGERY    . JOINT REPLACEMENT    . SHOULDER ARTHROSCOPY    . TOTAL KNEE ARTHROPLASTY     Social History   Socioeconomic History  . Marital status: Single    Spouse name: Not on file  . Number of children: Not on file  . Years of education: Not on file  . Highest education level: Not on file  Occupational History  . Not on file  Tobacco Use  . Smoking status: Never Smoker  . Smokeless tobacco: Never Used  Substance and Sexual Activity  . Alcohol use: No  . Drug use: No  . Sexual activity: Not on file  Other Topics Concern  . Not on file  Social History Narrative  . Not on file   Social Determinants of Health   Financial Resource Strain:   . Difficulty of Paying Living Expenses:   Food Insecurity:   . Worried About Charity fundraiser in the Last Year:   . Arboriculturist in the Last Year:   Transportation Needs:   . Film/video editor (Medical):   Marland Kitchen Lack of Transportation (Non-Medical):   Physical Activity:   . Days of Exercise per Week:   .  Minutes of Exercise per Session:   Stress:   . Feeling of Stress :   Social Connections:   . Frequency of Communication with Friends and Family:   . Frequency of Social Gatherings with Friends and Family:   . Attends Religious Services:   . Active Member of Clubs or Organizations:   . Attends Archivist Meetings:   Marland Kitchen Marital Status:    Family History  Problem Relation Age of Onset  . Heart failure Mother   . Cancer Father    - negative except otherwise stated in the family history section Allergies  Allergen Reactions  . Other Hives    Anti-snake venim for copperhead venom.  . Pravastatin Other (See Comments)    Leg cramps, elevated CK  . Prednisone Other (See Comments)    Patient states "it runs my blood sugar up so I don't take it."  . Toradol [Ketorolac Tromethamine] Hives and Nausea And Vomiting    Pill form only   Prior to Admission medications   Medication Sig Start Date End Date Taking? Authorizing Provider  allopurinol (ZYLOPRIM) 300 MG tablet Take 300 mg by mouth daily. 02/19/19  Yes [provider]  amitriptyline (ELAVIL) 25 MG tablet Take 25 mg by mouth at bedtime. 03/11/19  Yes [provider]  amLODipine ([place])  10 MG tablet Take 5 mg by mouth daily.  02/08/16  Yes [provider]  aspirin EC 81 MG tablet Take 81 mg by mouth daily.   Yes [provider]  furosemide (LASIX) 20 MG tablet Take 20 mg by mouth daily. 01/06/19  Yes [provider]  gabapentin (NEURONTIN) 300 MG capsule Take 300 mg by mouth 3 (three) times daily. 03/16/19  Yes [provider]  hydrochlorothiazide (HYDRODIURIL) 25 MG tablet Take 25 mg by mouth daily. 09/02/13  Yes [provider]  LEVEMIR FLEXTOUCH 100 UNIT/ML Pen Inject 70 Units into the skin 2 (two) times daily.  10/08/13  Yes [provider]  lisinopril (PRINIVIL,ZESTRIL) 40 MG tablet Take 40 mg by mouth daily. 03/05/16  Yes [provider]  metoprolol  succinate (TOPROL-XL) 100 MG 24 hr tablet Take 100 mg by mouth daily. 02/04/19  Yes [provider]  Oxycodone HCl 10 MG TABS Take 10 mg by mouth every 6 (six) hours. 02/19/19  Yes [provider]   DG Foot Complete Left  Result Date: 03/25/2019 CLINICAL DATA:  Possible foot infection.  Diabetic ulcer EXAM: LEFT FOOT - COMPLETE 3+ VIEW COMPARISON:  03/29/2017 FINDINGS: The first through third MTP joints are widened with sclerosis, spurring, and deformity of the adjacent bone. This is new from prior and new from a report at [facility] in 2020. The regional soft tissues are thickened. No opaque foreign body or fracture. Diffuse arterial calcification with osteopenia. IMPRESSION: Severe arthropathy of the first through third MTP joints that is new from 2020. 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 Electronically Signed   By: Monte Fantasia M.D.   On: 03/25/2019 10:52   VAS [place] LOWER EXTREMITY VENOUS (DVT) (ONLY [facility] & [facility])  Result Date: 03/25/2019  Lower Venous DVTStudy Indications: Diabetic foot ulcer, and Swelling.  Limitations: Edema. Comparison Study: No prior study on file Performing Technologist: Sharion Dove RVS  Examination Guidelines: A complete evaluation includes B-mode imaging, spectral Doppler, color Doppler, and power Doppler as needed of all accessible portions of each vessel. Bilateral testing is considered an integral part of a complete examination. Limited examinations for reoccurring indications may be performed as noted. The reflux portion of the exam is performed with the patient in reverse Trendelenburg.  +-----+---------------+---------+-----------+----------+--------------+ RIGHTCompressibilityPhasicitySpontaneityPropertiesThrombus Aging +-----+---------------+---------+-----------+----------+--------------+ CFV  Full           Yes      Yes                                  +-----+---------------+---------+-----------+----------+--------------+   +---------+---------------+---------+-----------+----------+--------------+ LEFT     CompressibilityPhasicitySpontaneityPropertiesThrombus Aging +---------+---------------+---------+-----------+----------+--------------+ CFV      Full           Yes      Yes                                 +---------+---------------+---------+-----------+----------+--------------+ SFJ      Full                                                        +---------+---------------+---------+-----------+----------+--------------+ FV Prox  Full                                                        +---------+---------------+---------+-----------+----------+--------------+  FV Mid   Full                                                        +---------+---------------+---------+-----------+----------+--------------+ FV DistalFull                                                        +---------+---------------+---------+-----------+----------+--------------+ PFV      Full                                                        +---------+---------------+---------+-----------+----------+--------------+ POP      Full           Yes      Yes                                 +---------+---------------+---------+-----------+----------+--------------+ PTV      Full                                                        +---------+---------------+---------+-----------+----------+--------------+ PERO                                                  Not visualized +---------+---------------+---------+-----------+----------+--------------+     *See table(s) above for measurements and observations. Electronically signed by Monica Martinez MD on 03/25/2019 at 4:33:22 PM.    Final    - pertinent xrays, CT, MRI studies were reviewed and independently interpreted  Positive ROS: All other systems have been  reviewed and were otherwise negative with the exception of those mentioned in the HPI and as above.  Physical Exam: General: Alert, no acute distress Psychiatric: Patient is competent for consent with normal mood and affect Lymphatic: No axillary or cervical lymphadenopathy Cardiovascular: No pedal edema Respiratory: No cyanosis, no use of accessory musculature GI: No organomegaly, abdomen is soft and non-tender    Images:  @ENCIMAGES @  Labs:  Lab Results  Component Value Date   HGBA1C 7.2 (H) 03/25/2019   HGBA1C 7.1 (H) 03/28/2017   ESRSEDRATE 81 (H) 03/25/2019   CRP 1.9 (H) 03/25/2019   REPTSTATUS PENDING 03/19/2019   GRAMSTAIN NO WBC SEEN NO ORGANISMS SEEN  03/19/2019   CULT  03/19/2019    RARE ENTEROBACTER AEROGENES RARE CANDIDA PARAPSILOSIS RARE ALCALIGENES FAECALIS SUSCEPTIBILITIES TO FOLLOW Performed at [facility], [place] [address], [place], [place] [address]    LABORGA ENTEROBACTER AEROGENES 03/19/2019    Lab Results  Component Value Date   ALBUMIN 3.7 03/25/2019   ALBUMIN 3.6 09/30/2017   ALBUMIN 3.9 03/16/2016   PREALBUMIN 17.6 (L) 03/25/2019    Neurologic: Patient does not have protective  sensation bilateral lower extremities.   MUSCULOSKELETAL:   Skin: Examination patient has necrotic changes with gangrene to the plantar aspect of the great toe second toe third toe and fifth toe.  There is also a blood blister beneath the metatarsal heads.  There is a draining sinus tract from an ulcer in the first webspace.  Patient has a good dorsalis pedis pulse.  Review of the radiographs shows destructive bony changes consistent with a history of gout.   Assessment: Assessment: Acute ischemic changes to the left forefoot with a chronic draining sinus tract beneath the first and second metatarsals consistent with osteomyelitis.  Plan: Plan: Discussed patient's best option would be to proceed with a transmetatarsal amputation he has undergone  antibiotic treatment with a PICC line x3 he has undergone hyperbaric oxygen therapy as well as debridement in the office all without resolution.  With his acute ischemic changes to the forefoot there appears to be an acute vascular insult combined with the chronic infection.  Risks and benefits of surgery were discussed including the risk of the wound not healing need for additional surgery.  The importance of nonweightbearing postoperatively was discussed.  Will use a wound VAC postoperatively as well.  Patient states he understands wished to proceed with surgery at this time.  Thank you for the consult and the opportunity to see [name], [place] [phone] 5:28 PM

## 2019-03-25 NOTE — H&P (Signed)
History and Physical  Patient Name: [name]     MHD:[id number]    DOB: 11/25/1955    DOA: 03/25/2019 PCP: System, Pcp Not In  Patient coming from: Hyperbaric clinic  Chief Complaint: Leg redness and swelling      HPI: [name] is a 64 y.o. M with hx DM, HTN, chronic pain on daily opioids and hx osteomyelitis of the R humerus at [facility] in 2015 and osteomyelitis of the left foot in 2020 who presents with slowly progressive redness and swelling of the left foot.  The patient is only able to give vague details about his left foot infection, but able to articulate that he has had this infection for "about 2 years", it has been debrided at least once, and he has had a PICC line for extended IV antibiotics at least once.  Since last on IV antibiotics, which he estimates about 9 months ago, his foot has been well until the beginning of this year when it became red and discolored again.  At that time he was referred to the hyperbaric clinic in [place] where he started to go regularly.  Recent cultures (presumably surface swabs?) from their clinic showed polymicrobial infection.  Started on cephalexin without improvement.  He has not had chronic constitutional symptoms.  He has been going to the hyperbaric clinic for the last few weeks, but his foot has been getting progressively more discolored until today the whole foot was red and swollen, so he was sent to the ER to rule out DVT.  In the ER, afebrile, hemodynamically stable.  Creatinine 1.3 up from baseline 0.9 a year ago, WBC 7.6K, lactate 1.4.  Venous ultrasound of the left leg was negative for DVT.  X-ray of the left foot showed changes of the left first through third MTP joints consistent with septic arthritis and osteomyelitis.  Hospitalist service were asked to evaluate for admission.       ROS: Review of Systems  Constitutional: Negative for chills, fever and malaise/fatigue.  Musculoskeletal: Negative for myalgias.  All  other systems reviewed and are negative.         Past Medical History:  Diagnosis Date  . Arthritis   . Back pain   . Diabetes mellitus without complication ([place])   . Hypertension     Past Surgical History:  Procedure Laterality Date  . BACK SURGERY    . CARPAL TUNNEL RELEASE    . CHOLECYSTECTOMY    . FOOT SURGERY    . JOINT REPLACEMENT    . SHOULDER ARTHROSCOPY    . TOTAL KNEE ARTHROPLASTY      Social History: Patient lives alone.  The patient walks unassisted.  In fact, he walks many miles per day for exercise.  He is on disability.  Nonsmoker.  Allergies  Allergen Reactions  . Other Hives    Anti-snake venim for copperhead venom.  . Pravastatin Other (See Comments)    Leg cramps, elevated CK  . Prednisone Other (See Comments)    Patient states "it runs my blood sugar up so I don't take it."  . Toradol [Ketorolac Tromethamine] Hives and Nausea And Vomiting    Pill form only    Family history: family history includes Cancer in his father; Heart failure in his mother.  Prior to Admission medications   Medication Sig Start Date End Date Taking? Authorizing Provider  allopurinol (ZYLOPRIM) 300 MG tablet Take 300 mg by mouth daily. 02/19/19  Yes [provider]  amitriptyline (ELAVIL) 25  MG tablet Take 25 mg by mouth at bedtime. 03/11/19  Yes [provider]  amLODipine (NORVASC) 10 MG tablet Take 5 mg by mouth daily.  02/08/16  Yes [provider]  aspirin EC 81 MG tablet Take 81 mg by mouth daily.   Yes [provider]  furosemide (LASIX) 20 MG tablet Take 20 mg by mouth daily. 01/06/19  Yes [provider]  gabapentin (NEURONTIN) 300 MG capsule Take 300 mg by mouth 3 (three) times daily. 03/16/19  Yes [provider]  hydrochlorothiazide (HYDRODIURIL) 25 MG tablet Take 25 mg by mouth daily. 09/02/13  Yes [provider]  LEVEMIR FLEXTOUCH 100 UNIT/ML Pen Inject 70 Units into the skin 2 (two) times daily.   10/08/13  Yes [provider]  lisinopril (PRINIVIL,ZESTRIL) 40 MG tablet Take 40 mg by mouth daily. 03/05/16  Yes [provider]  metoprolol succinate (TOPROL-XL) 100 MG 24 hr tablet Take 100 mg by mouth daily. 02/04/19  Yes [provider]  Oxycodone HCl 10 MG TABS Take 10 mg by mouth every 6 (six) hours. 02/19/19  Yes [provider]  cephALEXin (KEFLEX) 500 MG capsule Take 1 capsule (500 mg total) by mouth 4 (four) times daily. Patient not taking: Reported on 03/25/2019 03/29/17   Evalee Jefferson, PA-C  HYDROcodone-acetaminophen (NORCO/VICODIN) 5-325 MG tablet Take 1 tablet by mouth every 4 (four) hours as needed. Patient not taking: Reported on 03/25/2019 03/29/17   Evalee Jefferson, PA-C  tamsulosin (FLOMAX) 0.4 MG CAPS capsule Take 1 capsule (0.4 mg total) by mouth daily after supper. Patient not taking: Reported on 12/26/2017 03/16/16   Evalee Jefferson, PA-C       Physical Exam: BP 130/81 (BP Location: Right Arm)   Pulse 60   Temp 97.8 F (36.6 C) (Oral)   Resp 18   SpO2 100%  General appearance: Well-developed, adult male, alert and in no acute distress.   Eyes: Anicteric, conjunctiva pink, lids and lashes normal. PERRL.    ENT: No nasal deformity, discharge, epistaxis.  Hearing normal. OP moist without lesions.   Lips normal.  Partially edentulous. Neck: No neck masses.  Trachea midline.  No thyromegaly/tenderness. Lymph: No cervical or supraclavicular lymphadenopathy. Skin: Warm and dry.  No jaundice.  No suspicious rashes or lesions.  There is black blistering of the ball of the foot.  There is a packed central ulcer.  There is general redness of the left foot. Cardiac: RRR, nl S1-S2, no murmurs appreciated.  Capillary refill is brisk.  JVP normal.  Mild left LE edema, normal right.  Radial and DP pulses 2+ and symmetric. Respiratory: Normal respiratory rate and rhythm.  CTAB without rales or wheezes. Abdomen: Abdomen soft.  No TTP or guarding. No ascites,  distension, hepatosplenomegaly.   MSK: No deformities or effusions of the large joints of the upper or lower extremities bilaterally.  No cyanosis or clubbing. Neuro: Cranial nerves normal.  Sensation intact to light touch. Speech is fluent.  Muscle strength normal.    Psych: Sensorium intact and responding to questions, attention normal.  Behavior appropriate.  Affect normal.  Judgment and insight appear normal.     Labs on Admission:  I have personally reviewed following labs and imaging studies: CBC: Recent Labs  Lab 03/25/19 1021  WBC 7.6  NEUTROABS 5.4  HGB 10.2*  HCT 29.8*  MCV 92.0  PLT 793   Basic Metabolic Panel: Recent Labs  Lab 03/25/19 1021  NA 135  K 5.4*  CL 102  CO2 21*  GLUCOSE 180*  BUN 29*  CREATININE 1.30*  CALCIUM 10.2   GFR: CrCl cannot be calculated (Unknown ideal weight.).  Liver Function Tests: Recent Labs  Lab 03/25/19 1021  AST 15  ALT 11  ALKPHOS 70  BILITOT 0.6  PROT 8.0  ALBUMIN 3.7   No results for input(s): LIPASE, AMYLASE in the last 168 hours. No results for input(s): AMMONIA in the last 168 hours. Coagulation Profile: Recent Labs  Lab 03/25/19 1021  INR 1.1   CBG: Recent Labs  Lab 03/23/19 0943 03/23/19 1149 03/24/19 0947 03/24/19 1214 03/25/19 1012  GLUCAP 137* 117* 154* 137* 159*   Sepsis Labs: Lactic acid 1.4  Recent Results (from the past 240 hour(s))  Aerobic Culture (superficial specimen)     Status: None (Preliminary result)   Collection Time: 03/19/19  1:20 PM   Specimen: Wound  Result Value Ref Range Status   Specimen Description   Final    WOUND LEFT 2ND TOE Performed at [facility], [place] [address] [place], [place] [address]    Special Requests   Final    NONE Performed at [facility], [place] [address], [place], [place] [address]    Gram Stain NO WBC SEEN NO ORGANISMS SEEN   Final   Culture   Final    RARE ENTEROBACTER AEROGENES RARE CANDIDA  PARAPSILOSIS RARE ALCALIGENES FAECALIS SUSCEPTIBILITIES TO FOLLOW Performed at [facility], [place] [address] [place], [place] [address]    Report Status PENDING  Incomplete   Organism ID, Bacteria ENTEROBACTER AEROGENES  Final      Susceptibility   Enterobacter aerogenes - MIC*    CEFAZOLIN >=64 RESISTANT Resistant     CEFEPIME >=32 RESISTANT Resistant     CEFTAZIDIME <=1 SENSITIVE Sensitive     CEFTRIAXONE >=64 RESISTANT Resistant     CIPROFLOXACIN 0.5 SENSITIVE Sensitive     GENTAMICIN <=1 SENSITIVE Sensitive     IMIPENEM 2 SENSITIVE Sensitive     TRIMETH/SULFA <=20 SENSITIVE Sensitive     PIP/TAZO 8 SENSITIVE Sensitive     * RARE ENTEROBACTER AEROGENES           Radiological Exams on Admission: Personally reviewed x-ray of the left foot showing osteomyelitis: DG Foot Complete Left  Result Date: 03/25/2019 CLINICAL DATA:  Possible foot infection.  Diabetic ulcer EXAM: LEFT FOOT - COMPLETE 3+ VIEW COMPARISON:  03/29/2017 FINDINGS: The first through third MTP joints are widened with sclerosis, spurring, and deformity of the adjacent bone. This is new from prior and new from a report at [facility] in 2020. The regional soft tissues are thickened. No opaque foreign body or fracture. Diffuse arterial calcification with osteopenia. IMPRESSION: Severe arthropathy of the first through third MTP joints that is new from 2020. 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 Electronically Signed   By: Monte Fantasia M.D.   On: 03/25/2019 10:52   VAS [place] LOWER EXTREMITY VENOUS (DVT) (ONLY [facility] & [facility])  Result Date: 03/25/2019  Lower Venous DVTStudy Indications: Diabetic foot ulcer, and Swelling.  Limitations: Edema. Comparison Study: No prior study on file Performing Technologist: Sharion Dove RVS  Examination Guidelines: A complete evaluation includes B-mode imaging, spectral Doppler, color Doppler, and  power Doppler as needed of all accessible portions of each vessel. Bilateral testing is considered an integral part of a complete examination. Limited examinations for reoccurring indications may be performed as noted. The reflux  portion of the exam is performed with the patient in reverse Trendelenburg.  +-----+---------------+---------+-----------+----------+--------------+ RIGHTCompressibilityPhasicitySpontaneityPropertiesThrombus Aging +-----+---------------+---------+-----------+----------+--------------+ CFV  Full           Yes      Yes                                 +-----+---------------+---------+-----------+----------+--------------+   +---------+---------------+---------+-----------+----------+--------------+ LEFT     CompressibilityPhasicitySpontaneityPropertiesThrombus Aging +---------+---------------+---------+-----------+----------+--------------+ CFV      Full           Yes      Yes                                 +---------+---------------+---------+-----------+----------+--------------+ SFJ      Full                                                        +---------+---------------+---------+-----------+----------+--------------+ FV Prox  Full                                                        +---------+---------------+---------+-----------+----------+--------------+ FV Mid   Full                                                        +---------+---------------+---------+-----------+----------+--------------+ FV DistalFull                                                        +---------+---------------+---------+-----------+----------+--------------+ PFV      Full                                                        +---------+---------------+---------+-----------+----------+--------------+ POP      Full           Yes      Yes                                  +---------+---------------+---------+-----------+----------+--------------+ PTV      Full                                                        +---------+---------------+---------+-----------+----------+--------------+ PERO  Not visualized +---------+---------------+---------+-----------+----------+--------------+     *See table(s) above for measurements and observations.    Preliminary     EKG: Independently reviewed. Normal rate, NSR, early repol pattern.       Assessment/Plan   Osteomyelitis of the left foot The patient strongly desires medical treatment.  By phone discussing with Dr. Sheron Nightingale nurse from [facility], they noted that the patient had had an operative debridement for osteomyelitis in Dec 0017, which conflicts with the history provided by the patient.  -Records have been requested from [facility] antibiotics -Check CRP and sed rate -Check ABIs -Consult infectious disease and orthopedics, appreciate expert cares   Renal insufficiency Baseline creatinine 0.9, creatinine currently 1.3. -IV fluids and trend creatinine -Hold nephrotoxins  Normocytic anemia Likely due to chronic infection.  No clinical blood loss.  Diabetes with polyneuropathy -Continue home Levemir, reduced dose to 40 daily -Sliding scale correction insulin ordered -Continue baby aspirin -Continue amitriptyline and gabapentin  Hypertension Blood pressure normal -Continue amlodipine, Lasix, lisinopril, metoprolol -Hold duplicative diuretic  Gout -Continue allopurinol     DVT prophylaxis: Lovenox  Code Status: FULL  Family Communication:   Disposition Plan: Anticipate ID and Ortho consults.  The patient is hopeful he can avoid surgery.  ID will discuss the feasibility of medical cure with him.  If they feel this is manageable with antibiotics, will expect PICC and biopsy for culture and targeted  antibiotics.  Otherwise, likely amputation during this hospitalization. Consults called: ID, Ortho Admission status: INPATIENT   At the time of admission, it appears that the appropriate admission status for this patient is INPATIENT. This is judged to be reasonable and necessary in order to provide the required intensity of service to ensure the patient's safety given: -presenting symptoms of foot swelling -physical exam findings of redness and swelling of the foot, and  -initial radiographic and laboratory data osteomyelitis on the foot, not responding to oral cephalexin, as well as renal insufficiency -in the context of their chronic comorbidities osteomyelitis, anemia, and diabetes    Together, these circumstances are felt to place him at high risk for further clinical deterioration threatening life, limb, or organ requiring a high intensity of service due to this acute illness that poses a threat to the left lower extremity  I certify that at the point of admission it is my clinical judgment that the patient will require inpatient hospital care spanning beyond 2 midnights from the point of admission and that early discharge would result in unnecessary risk of decompensation and readmission or threat to life, limb or bodily function.   Medical decision making: Patient seen at 2:54 PM on 03/25/2019.  The patient was discussed with Dr. Baxter Flattery, Silvestre Gunner, PA-C, and Dr. Enid Derry.  What exists of the patient's chart was reviewed in depth and summarized above.  Clinical condition: stable.        [facility] Please page though [place] or Epic secure chat:  For password, contact charge nurse

## 2019-03-25 NOTE — Consult Note (Signed)
Reason for Consult:Left foot osteo Referring Physician: C Kendrell [name] is an 64 y.o. male.  HPI: [name] comes in with a long-standing hx/o right foot osteo. He been having it treated at the wound care center in [place] but was sent here 2/2 worsening cellulitis. He is fairly adamant about not wanting amputation or other surgery on his foot. He has several anecdotal stories of poor outcomes from people he knows. Was asked to consult to give information regarding reasonable outcomes and risks of amputation.  Past Medical History:  Diagnosis Date  . Arthritis   . Back pain   . Diabetes mellitus without complication ([name])   . Hypertension     Past Surgical History:  Procedure Laterality Date  . BACK SURGERY    . CARPAL TUNNEL RELEASE    . CHOLECYSTECTOMY    . FOOT SURGERY    . JOINT REPLACEMENT    . SHOULDER ARTHROSCOPY    . TOTAL KNEE ARTHROPLASTY      Family History  Problem Relation Age of Onset  . Heart failure Mother   . Cancer Father     Social History:  reports that he has never smoked. He has never used smokeless tobacco. He reports that he does not drink alcohol or use drugs.  Allergies:  Allergies  Allergen Reactions  . Other Hives    Anti-snake venim for copperhead venom.  . Pravastatin Other (See Comments)    Leg cramps, elevated CK  . Prednisone Other (See Comments)    Patient states "it runs my blood sugar up so I don't take it."  . Toradol [Ketorolac Tromethamine] Hives and Nausea And Vomiting    Pill form only    Medications: I have reviewed the patient's current medications.  Results for orders placed or performed during the hospital encounter of 03/25/19 (from the past 48 hour(s))  CBG monitoring, ED     Status: Abnormal   Collection Time: 03/25/19 10:12 AM  Result Value Ref Range   Glucose-Capillary 159 (H) 70 - 99 mg/dL    Comment: Glucose reference range applies only to samples taken after fasting for at least 8 hours.  Comprehensive  metabolic panel     Status: Abnormal   Collection Time: 03/25/19 10:21 AM  Result Value Ref Range   Sodium 135 135 - 145 mmol/L   Potassium 5.4 (H) 3.5 - 5.1 mmol/L   Chloride 102 98 - 111 mmol/L   CO2 21 (L) 22 - 32 mmol/L   Glucose, Bld 180 (H) 70 - 99 mg/dL    Comment: Glucose reference range applies only to samples taken after fasting for at least 8 hours.   BUN 29 (H) 8 - 23 mg/dL   Creatinine, Ser 1.30 (H) 0.61 - 1.24 mg/dL   Calcium 10.2 8.9 - 10.3 mg/dL   Total Protein 8.0 6.5 - 8.1 g/dL   Albumin 3.7 3.5 - 5.0 g/dL   AST 15 15 - 41 U/L   ALT 11 0 - 44 U/L   Alkaline Phosphatase 70 38 - 126 U/L   Total Bilirubin 0.6 0.3 - 1.2 mg/dL   GFR calc non Af Amer 59 (L) >60 mL/min   GFR calc Af Amer >60 >60 mL/min   Anion gap 12 5 - 15    Comment: Performed at [facility] [address], [place],  [address]  Lactic acid, plasma     Status: None   Collection Time: 03/25/19 10:21 AM  Result Value Ref Range  Lactic Acid, Venous 1.4 0.5 - 1.9 mmol/L    Comment: Performed at [facility], [place] [address] [place], [place] [address]  CBC with Differential     Status: Abnormal   Collection Time: 03/25/19 10:21 AM  Result Value Ref Range   WBC 7.6 4.0 - 10.5 K/uL   RBC 3.24 (L) 4.22 - 5.81 MIL/uL   Hemoglobin 10.2 (L) 13.0 - 17.0 g/dL   HCT 29.8 (L) 39.0 - 52.0 %   MCV 92.0 80.0 - 100.0 fL   MCH 31.5 26.0 - 34.0 pg   MCHC 34.2 30.0 - 36.0 g/dL   RDW 15.7 (H) 11.5 - 15.5 %   Platelets 188 150 - 400 K/uL   nRBC 0.0 0.0 - 0.2 %   Neutrophils Relative % 71 %   Neutro Abs 5.4 1.7 - 7.7 K/uL   Lymphocytes Relative 21 %   Lymphs Abs 1.6 0.7 - 4.0 K/uL   Monocytes Relative 5 %   Monocytes Absolute 0.3 0.1 - 1.0 K/uL   Eosinophils Relative 1 %   Eosinophils Absolute 0.1 0.0 - 0.5 K/uL   Basophils Relative 0 %   Basophils Absolute 0.0 0.0 - 0.1 K/uL   Immature Granulocytes 2 %   Abs Immature Granulocytes 0.12 (H) 0.00 - 0.07 K/uL    Comment: Performed at [facility] [address], [place], [place] [address]  Protime-INR     Status: None   Collection Time: 03/25/19 10:21 AM  Result Value Ref Range   Prothrombin Time 13.6 11.4 - 15.2 seconds   INR 1.1 0.8 - 1.2    Comment: (NOTE) INR goal varies based on device and disease states. Performed at [facility], [place] [address] [place], [place] [address]   Lactic acid, plasma     Status: None   Collection Time: 03/25/19 12:23 PM  Result Value Ref Range   Lactic Acid, Venous 0.9 0.5 - 1.9 mmol/L    Comment: Performed at [facility] [address], [place], [place] [address]    DG Foot Complete Left  Result Date: 03/25/2019 CLINICAL DATA:  Possible foot infection.  Diabetic ulcer EXAM: LEFT FOOT - COMPLETE 3+ VIEW COMPARISON:  03/29/2017 FINDINGS: The first through third MTP joints are widened with sclerosis, spurring, and deformity of the adjacent bone. This is new from prior and new from a report at [facility] in 2020. The regional soft tissues are thickened. No opaque foreign body or fracture. Diffuse arterial calcification with osteopenia. IMPRESSION: Severe arthropathy of the first through third MTP joints that is new from 2020. 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 Electronically Signed   By: Monte Fantasia M.D.   On: 03/25/2019 10:52   VAS [place] LOWER EXTREMITY VENOUS (DVT) (ONLY [facility] & [facility])  Result Date: 03/25/2019  Lower Venous DVTStudy Indications: Diabetic foot ulcer, and Swelling.  Limitations: Edema. Comparison Study: No prior study on file Performing Technologist: Sharion Dove RVS  Examination Guidelines: A complete evaluation includes B-mode imaging, spectral Doppler, color Doppler, and power Doppler as needed of all accessible portions of each vessel. Bilateral testing is considered an integral part of a complete examination. Limited examinations for reoccurring indications may  be performed as noted. The reflux portion of the exam is performed with the patient in reverse Trendelenburg.  +-----+---------------+---------+-----------+----------+--------------+ RIGHTCompressibilityPhasicitySpontaneityPropertiesThrombus Aging +-----+---------------+---------+-----------+----------+--------------+ CFV  Full  Yes      Yes                                 +-----+---------------+---------+-----------+----------+--------------+   +---------+---------------+---------+-----------+----------+--------------+ LEFT     CompressibilityPhasicitySpontaneityPropertiesThrombus Aging +---------+---------------+---------+-----------+----------+--------------+ CFV      Full           Yes      Yes                                 +---------+---------------+---------+-----------+----------+--------------+ SFJ      Full                                                        +---------+---------------+---------+-----------+----------+--------------+ FV Prox  Full                                                        +---------+---------------+---------+-----------+----------+--------------+ FV Mid   Full                                                        +---------+---------------+---------+-----------+----------+--------------+ FV DistalFull                                                        +---------+---------------+---------+-----------+----------+--------------+ PFV      Full                                                        +---------+---------------+---------+-----------+----------+--------------+ POP      Full           Yes      Yes                                 +---------+---------------+---------+-----------+----------+--------------+ PTV      Full                                                        +---------+---------------+---------+-----------+----------+--------------+ PERO                                                   Not visualized +---------+---------------+---------+-----------+----------+--------------+     *See table(s) above for measurements and observations.  Preliminary     Review of Systems  HENT: Negative for ear discharge, ear pain, hearing loss and tinnitus.   Eyes: Negative for photophobia and pain.  Respiratory: Negative for cough and shortness of breath.   Cardiovascular: Negative for chest pain.  Gastrointestinal: Negative for abdominal pain, nausea and vomiting.  Genitourinary: Negative for dysuria, flank pain, frequency and urgency.  Musculoskeletal: Positive for arthralgias (Left foot). Negative for back pain, myalgias and neck pain.  Neurological: Negative for dizziness and headaches.  Hematological: Does not bruise/bleed easily.  Psychiatric/Behavioral: The patient is not nervous/anxious.    Blood pressure 130/81, pulse 60, temperature 97.8 F (36.6 Darrell), temperature source Oral, resp. rate 18, SpO2 100 %. Physical Exam  Constitutional: He appears well-developed and well-nourished. No distress.  HENT:  Head: Normocephalic and atraumatic.  Eyes: Conjunctivae are normal. Right eye exhibits no discharge. Left eye exhibits no discharge. No scleral icterus.  Cardiovascular: Normal rate and regular rhythm.  Respiratory: Effort normal. No respiratory distress.  Musculoskeletal:     Cervical back: Normal range of motion.     Comments: LLE No traumatic wounds or ecchymosis. Bullae formation plantar forefoot and toes, packed wound plantar forefoot  Nontender  No knee or ankle effusion  Knee stable to varus/ valgus and anterior/posterior stress  Sens DPN, SPN, intact, TN mildly paresthetic  Motor EHL, ext, flex, evers 5/5  DP 2+, PT 1+, No significant edema  Neurological: He is alert.  Skin: Skin is warm and dry. He is not diaphoretic.  Psychiatric: He has a normal mood and affect. His behavior is normal.    Assessment/Plan: Left foot osteo -- Discussed  R/B/A of medical vs surgical treatment. Told him that eradication of the osteo medically was not impossible but extremely low. Medical treatment would consist of ongoing bouts of cellulitis and antibiotics +/- sepsis and hospital admissions with the real possibility that sepsis could lead to death. Surgery would involve amputation but given his good blood flow would expect him to heal surgical wound and be able to walk using some sort of prosthesis. [name] to evaluate, likely tomorrow. I told him he could let us or his attending know if he changed his mind and wanted to pursue a surgical option.    Lisette Abu, PA-[facility] [phone] 03/25/2019, 3:30 PM

## 2019-03-25 NOTE — H&P (View-Only) (Signed)
ORTHOPAEDIC CONSULTATION  REQUESTING PHYSICIAN: Edwin Dada, *  Chief Complaint: Chronic ulceration beneath the forefoot with acute gangrenous changes to his toes.  HPI: [name] is a 64 y.o. male who presents with diabetic insensate neuropathy history of gout with chronic ulceration beneath the forefoot.  Patient states he is undergone serial debridements in the office at the wound center he has undergone antibiotic treatment with a PICC line x3 he has undergone hyperbaric oxygen and after his last treatment with hyperbaric oxygen patient states he had acute gangrenous changes to the plantar aspect of his toes and metatarsal heads.  Past Medical History:  Diagnosis Date  . Arthritis   . Back pain   . Diabetes mellitus without complication ([place])   . Hypertension    Past Surgical History:  Procedure Laterality Date  . BACK SURGERY    . CARPAL TUNNEL RELEASE    . CHOLECYSTECTOMY    . FOOT SURGERY    . JOINT REPLACEMENT    . SHOULDER ARTHROSCOPY    . TOTAL KNEE ARTHROPLASTY     Social History   Socioeconomic History  . Marital status: Single    Spouse name: Not on file  . Number of children: Not on file  . Years of education: Not on file  . Highest education level: Not on file  Occupational History  . Not on file  Tobacco Use  . Smoking status: Never Smoker  . Smokeless tobacco: Never Used  Substance and Sexual Activity  . Alcohol use: No  . Drug use: No  . Sexual activity: Not on file  Other Topics Concern  . Not on file  Social History Narrative  . Not on file   Social Determinants of Health   Financial Resource Strain:   . Difficulty of Paying Living Expenses:   Food Insecurity:   . Worried About Charity fundraiser in the Last Year:   . Arboriculturist in the Last Year:   Transportation Needs:   . Film/video editor (Medical):   Marland Kitchen Lack of Transportation (Non-Medical):   Physical Activity:   . Days of Exercise per Week:   .  Minutes of Exercise per Session:   Stress:   . Feeling of Stress :   Social Connections:   . Frequency of Communication with Friends and Family:   . Frequency of Social Gatherings with Friends and Family:   . Attends Religious Services:   . Active Member of Clubs or Organizations:   . Attends Archivist Meetings:   Marland Kitchen Marital Status:    Family History  Problem Relation Age of Onset  . Heart failure Mother   . Cancer Father    - negative except otherwise stated in the family history section Allergies  Allergen Reactions  . Other Hives    Anti-snake venim for copperhead venom.  . Pravastatin Other (See Comments)    Leg cramps, elevated CK  . Prednisone Other (See Comments)    Patient states "it runs my blood sugar up so I don't take it."  . Toradol [Ketorolac Tromethamine] Hives and Nausea And Vomiting    Pill form only   Prior to Admission medications   Medication Sig Start Date End Date Taking? Authorizing Provider  allopurinol (ZYLOPRIM) 300 MG tablet Take 300 mg by mouth daily. 02/19/19  Yes [provider]  amitriptyline (ELAVIL) 25 MG tablet Take 25 mg by mouth at bedtime. 03/11/19  Yes [provider]  amLODipine ([place])  10 MG tablet Take 5 mg by mouth daily.  02/08/16  Yes [provider]  aspirin EC 81 MG tablet Take 81 mg by mouth daily.   Yes [provider]  furosemide (LASIX) 20 MG tablet Take 20 mg by mouth daily. 01/06/19  Yes [provider]  gabapentin (NEURONTIN) 300 MG capsule Take 300 mg by mouth 3 (three) times daily. 03/16/19  Yes [provider]  hydrochlorothiazide (HYDRODIURIL) 25 MG tablet Take 25 mg by mouth daily. 09/02/13  Yes [provider]  LEVEMIR FLEXTOUCH 100 UNIT/ML Pen Inject 70 Units into the skin 2 (two) times daily.  10/08/13  Yes [provider]  lisinopril (PRINIVIL,ZESTRIL) 40 MG tablet Take 40 mg by mouth daily. 03/05/16  Yes [provider]  metoprolol  succinate (TOPROL-XL) 100 MG 24 hr tablet Take 100 mg by mouth daily. 02/04/19  Yes [provider]  Oxycodone HCl 10 MG TABS Take 10 mg by mouth every 6 (six) hours. 02/19/19  Yes [provider]   DG Foot Complete Left  Result Date: 03/25/2019 CLINICAL DATA:  Possible foot infection.  Diabetic ulcer EXAM: LEFT FOOT - COMPLETE 3+ VIEW COMPARISON:  03/29/2017 FINDINGS: The first through third MTP joints are widened with sclerosis, spurring, and deformity of the adjacent bone. This is new from prior and new from a report at [facility] in 2020. The regional soft tissues are thickened. No opaque foreign body or fracture. Diffuse arterial calcification with osteopenia. IMPRESSION: Severe arthropathy of the first through third MTP joints that is new from 2020. 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 Electronically Signed   By: Monte Fantasia M.D.   On: 03/25/2019 10:52   VAS [place] LOWER EXTREMITY VENOUS (DVT) (ONLY [facility])  Result Date: 03/25/2019  Lower Venous DVTStudy Indications: Diabetic foot ulcer, and Swelling.  Limitations: Edema. Comparison Study: No prior study on file Performing Technologist: Sharion Dove RVS  Examination Guidelines: A complete evaluation includes B-mode imaging, spectral Doppler, color Doppler, and power Doppler as needed of all accessible portions of each vessel. Bilateral testing is considered an integral part of a complete examination. Limited examinations for reoccurring indications may be performed as noted. The reflux portion of the exam is performed with the patient in reverse Trendelenburg.  +-----+---------------+---------+-----------+----------+--------------+ RIGHTCompressibilityPhasicitySpontaneityPropertiesThrombus Aging +-----+---------------+---------+-----------+----------+--------------+ CFV  Full           Yes      Yes                                  +-----+---------------+---------+-----------+----------+--------------+   +---------+---------------+---------+-----------+----------+--------------+ LEFT     CompressibilityPhasicitySpontaneityPropertiesThrombus Aging +---------+---------------+---------+-----------+----------+--------------+ CFV      Full           Yes      Yes                                 +---------+---------------+---------+-----------+----------+--------------+ SFJ      Full                                                        +---------+---------------+---------+-----------+----------+--------------+ FV Prox  Full                                                        +---------+---------------+---------+-----------+----------+--------------+  FV Mid   Full                                                        +---------+---------------+---------+-----------+----------+--------------+ FV DistalFull                                                        +---------+---------------+---------+-----------+----------+--------------+ PFV      Full                                                        +---------+---------------+---------+-----------+----------+--------------+ POP      Full           Yes      Yes                                 +---------+---------------+---------+-----------+----------+--------------+ PTV      Full                                                        +---------+---------------+---------+-----------+----------+--------------+ PERO                                                  Not visualized +---------+---------------+---------+-----------+----------+--------------+     *See table(s) above for measurements and observations. Electronically signed by Monica Martinez MD on 03/25/2019 at 4:33:22 PM.    Final    - pertinent xrays, CT, MRI studies were reviewed and independently interpreted  Positive ROS: All other systems have been  reviewed and were otherwise negative with the exception of those mentioned in the HPI and as above.  Physical Exam: General: Alert, no acute distress Psychiatric: Patient is competent for consent with normal mood and affect Lymphatic: No axillary or cervical lymphadenopathy Cardiovascular: No pedal edema Respiratory: No cyanosis, no use of accessory musculature GI: No organomegaly, abdomen is soft and non-tender    Images:  @ENCIMAGES @  Labs:  Lab Results  Component Value Date   HGBA1C 7.2 (H) 03/25/2019   HGBA1C 7.1 (H) 03/28/2017   ESRSEDRATE 81 (H) 03/25/2019   CRP 1.9 (H) 03/25/2019   REPTSTATUS PENDING 03/19/2019   GRAMSTAIN NO WBC SEEN NO ORGANISMS SEEN  03/19/2019   CULT  03/19/2019    RARE ENTEROBACTER AEROGENES RARE CANDIDA PARAPSILOSIS RARE ALCALIGENES FAECALIS SUSCEPTIBILITIES TO FOLLOW Performed at [facility], [place] [address], [place], [place] [address]    LABORGA ENTEROBACTER AEROGENES 03/19/2019    Lab Results  Component Value Date   ALBUMIN 3.7 03/25/2019   ALBUMIN 3.6 09/30/2017   ALBUMIN 3.9 03/16/2016   PREALBUMIN 17.6 (L) 03/25/2019    Neurologic: Patient does not have protective  sensation bilateral lower extremities.   MUSCULOSKELETAL:   Skin: Examination patient has necrotic changes with gangrene to the plantar aspect of the great toe second toe third toe and fifth toe.  There is also a blood blister beneath the metatarsal heads.  There is a draining sinus tract from an ulcer in the first webspace.  Patient has a good dorsalis pedis pulse.  Review of the radiographs shows destructive bony changes consistent with a history of gout.   Assessment: Assessment: Acute ischemic changes to the left forefoot with a chronic draining sinus tract beneath the first and second metatarsals consistent with osteomyelitis.  Plan: Plan: Discussed patient's best option would be to proceed with a transmetatarsal amputation he has undergone  antibiotic treatment with a PICC line x3 he has undergone hyperbaric oxygen therapy as well as debridement in the office all without resolution.  With his acute ischemic changes to the forefoot there appears to be an acute vascular insult combined with the chronic infection.  Risks and benefits of surgery were discussed including the risk of the wound not healing need for additional surgery.  The importance of nonweightbearing postoperatively was discussed.  Will use a wound VAC postoperatively as well.  Patient states he understands wished to proceed with surgery at this time.  Thank you for the consult and the opportunity to see Mr. Marlene Pfluger, [place] [phone] 5:28 PM

## 2019-03-25 NOTE — ED Provider Notes (Signed)
[facility] EMERGENCY DEPARTMENT Provider Note   CSN: [id number] Arrival date & time: 03/25/19  1002   History Chief Complaint  Patient presents with  . Wound Infection   [name] is a 64 y.o. male with PMH of T2DM, HTN here for left diabetic foot ulcer that has been present for over 1 year.  Patient was seen at wound center this morning for his regular hyperbaric treatment when they chose to send him to the emergency room due to unilateral left lower extremity swelling and pain with concern for DVT.  Patient reports currently that he is not having any chest pain or shortness of breath. Patient states that his left lower extremity started being more painful than normal yesterday.     He reports that he took all of his morning medications aside from his Lantus due to him having treatment. He has had this chronic diabetic wound for quite some time and has previously been told by an orthopedic surgeon that he would need to have amputation although patient adamantly refused.  Patient states he has been admitted to the hospital and has had 4 previous picc-lines for IV antibiotics at the [facility].  Patient sees Dr. Ladona Horns, orthopedic surgeon in [place] for his care and has recommended amputation, but patient refuses.  Patient states that he has been on an oral antibiotic outpatient for management of his foot wound but he is unsure the name of the medication.    Past Medical History:  Diagnosis Date  . Arthritis   . Back pain   . Diabetes mellitus without complication ([place])   . Hypertension    Patient Active Problem List   Diagnosis Date Noted  . Myelopathy ([place]) 07/16/2013  . Ingrown nail 07/05/2013  . Paronychia 07/05/2013  . Diverticulosis 04/22/2013  . Acute osteomyelitis of humerus ([place]) 02/07/2013  . Osteomyelitis ([place]) 02/01/2013  . Joint infection ([place]) 01/22/2013  . Pyogenic arthritis of shoulder region ([place]) 01/15/2013  . Nephrolithiasis 01/23/2012  .  Lumbar radiculopathy 02/28/2011  . Gout flare 04/30/2010  . Chronic pain 02/14/2010  . NASH (nonalcoholic steatohepatitis) 11/08/2008  . Obesity 10/27/2008  . Hypertriglyceridemia 10/27/2007  . DM (diabetes mellitus), type 2, uncontrolled ([place]) 03/12/2006  . Essential hypertension, benign 03/12/2006   Past Surgical History:  Procedure Laterality Date  . BACK SURGERY    . CARPAL TUNNEL RELEASE    . CHOLECYSTECTOMY    . FOOT SURGERY    . JOINT REPLACEMENT    . SHOULDER ARTHROSCOPY    . TOTAL KNEE ARTHROPLASTY      Family History  Problem Relation Age of Onset  . Heart failure Mother   . Cancer Father     Social History   Tobacco Use  . Smoking status: Never Smoker  . Smokeless tobacco: Never Used  Substance Use Topics  . Alcohol use: No  . Drug use: No   Home Medications Prior to Admission medications   Medication Sig Start Date End Date Taking? Authorizing Provider  amLODipine (NORVASC) 10 MG tablet Take 5 mg by mouth daily.  02/08/16   [provider]  aspirin EC 81 MG tablet Take 81 mg by mouth daily.    [provider]  cephALEXin (KEFLEX) 500 MG capsule Take 1 capsule (500 mg total) by mouth 4 (four) times daily. 03/29/17   Evalee Jefferson, PA-C  hydrochlorothiazide (HYDRODIURIL) 25 MG tablet Take 25 mg by mouth daily. 09/02/13   [provider]  HYDROcodone-acetaminophen (NORCO/VICODIN) 5-325 MG tablet  Take 1 tablet by mouth every 4 (four) hours as needed. 03/29/17   Idol, Almyra Free, PA-C  LEVEMIR FLEXTOUCH 100 UNIT/ML Pen Inject 80 Units into the skin 2 (two) times daily. 10/08/13   [provider]  lisinopril (PRINIVIL,ZESTRIL) 40 MG tablet Take 40 mg by mouth daily. 03/05/16   [provider]  metFORMIN (GLUCOPHAGE) 500 MG tablet Take 1,000 mg by mouth 2 (two) times daily with a meal.  02/13/16   [provider]  metoprolol (LOPRESSOR) 100 MG tablet Take 100 mg by mouth 2 (two) times daily. 09/02/13   [provider]    tamsulosin (FLOMAX) 0.4 MG CAPS capsule Take 1 capsule (0.4 mg total) by mouth daily after supper. Patient not taking: Reported on 12/26/2017 03/16/16   Evalee Jefferson, PA-C   Allergies    Other, Pravastatin, Prednisone, Toradol [ketorolac tromethamine], and Ultram [tramadol]  Review of Systems   Review of Systems  Constitutional: Negative for chills and fever.  Respiratory: Negative for chest tightness and shortness of breath.   Cardiovascular: Negative for chest pain and palpitations.  Gastrointestinal: Negative for abdominal pain, constipation, diarrhea and vomiting.  Skin: Positive for wound.  Neurological: Negative for dizziness, light-headedness and headaches.    Physical Exam Updated Vital Signs BP 105/84 (BP Location: Right Arm)   Pulse 79   Temp 98.1 F (36.7 C) (Oral)   Resp 16   SpO2 100%   Physical Exam Vitals and nursing note reviewed.  Constitutional:      General: He is not in acute distress.    Appearance: Normal appearance. He is normal weight. He is not ill-appearing.  HENT:     Head: Normocephalic and atraumatic.     Mouth/Throat:     Mouth: Mucous membranes are moist.  Eyes:     Conjunctiva/sclera: Conjunctivae normal.     Pupils: Pupils are equal, round, and reactive to light.  Cardiovascular:     Rate and Rhythm: Normal rate and regular rhythm.     Heart sounds: Normal heart sounds.  Pulmonary:     Effort: Pulmonary effort is normal.     Breath sounds: Normal breath sounds.  Abdominal:     Palpations: Abdomen is soft.  Musculoskeletal:        General: Normal range of motion.     Cervical back: Normal range of motion.     Left lower leg: Edema present.  Skin:    General: Skin is warm.     Capillary Refill: Capillary refill takes less than 2 seconds.  Neurological:     General: No focal deficit present.     Mental Status: He is alert and oriented to person, place, and time.     ED Results / Procedures / Treatments   Labs (all labs ordered  are listed, but only abnormal results are displayed) Labs Reviewed  CBC WITH DIFFERENTIAL/PLATELET - Abnormal; Notable for the following components:      Result Value   RBC 3.24 (*)    Hemoglobin 10.2 (*)    HCT 29.8 (*)    RDW 15.7 (*)    Abs Immature Granulocytes 0.12 (*)    All other components within normal limits  CBG MONITORING, ED - Abnormal; Notable for the following components:   Glucose-Capillary 159 (*)    All other components within normal limits  CULTURE, BLOOD (ROUTINE X 2)  CULTURE, BLOOD (ROUTINE X 2)  PROTIME-INR  COMPREHENSIVE METABOLIC PANEL  LACTIC ACID, PLASMA  LACTIC ACID, PLASMA    EKG  None  Radiology DG Foot Complete Left  Result Date: 03/25/2019 CLINICAL DATA:  Possible foot infection.  Diabetic ulcer EXAM: LEFT FOOT - COMPLETE 3+ VIEW COMPARISON:  03/29/2017 FINDINGS: The first through third MTP joints are widened with sclerosis, spurring, and deformity of the adjacent bone. This is new from prior and new from a report at [facility] in 2020. The regional soft tissues are thickened. No opaque foreign body or fracture. Diffuse arterial calcification with osteopenia. IMPRESSION: Severe arthropathy of the first through third MTP joints that is new from 2020. 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 Electronically Signed   By: Monte Fantasia M.D.   On: 03/25/2019 10:52   Procedures Procedures (including critical care time)  Medications Ordered in ED Medications - No data to display  ED Course  I have reviewed the triage vital signs and the nursing notes.  Pertinent labs & imaging results that were available during my care of the patient were reviewed by me and considered in my medical decision making (see chart for details).    MDM Rules/Calculators/A&P                      64 yo male here with left foot diabetic ulcer.  Patient was sent by the wound center this  morning for concern of development of DVT given new onset of pain and tenderness of left lower extremity.  On arrival patient's vital signs stable. X-ray of left foot with findings concerning for osteomyelitis. Initiated IV antibiotics, vanc and cefepime for osteomyelitis given failure of outpatient oral antibiotics. Patient also noted to have AKI with creatinine of 1.3 and baseline appears to be around.9-1.00.This may be patient's new baseline but unclear.   Patient states that he would like to have another PICC line placed for IV antibiotics given that he has been on outpatient oral antibiotics without any improvement in his foot. Spoke with orthopedics over the phone at 11:40am who recommended the admitting team call them if patient becomes interested in surgery, as he has previously refused in the past.   DVT US negative for DVT. Unclear if this is a chronic finding or an acute worsening of patient's current ulceration.  Spoke with admitting hospitalist team who will admit for osteomyelitis with failed outpatient therapy with consideration for picc line for outpatient IV abx, as well as ?new AKI.  [name] was evaluated in Emergency Department on 03/25/2019 for the symptoms described in the history of present illness. He was evaluated in the context of the global COVID-19 pandemic, which necessitated consideration that the patient might be at risk for infection with the SARS-CoV-2 virus that causes COVID-19. Institutional protocols and algorithms that pertain to the evaluation of patients at risk for COVID-19 are in a state of rapid change based on information released by regulatory bodies including the CDC and federal and state organizations. These policies and algorithms were followed during the patient's care in the ED. Final Clinical Impression(s) / ED Diagnoses Final diagnoses:  None    Rx / DC Orders ED Discharge Orders    None       Primrose Oler, [place], DO 03/25/19 1306    Drenda Freeze, MD 03/26/19 [phone]

## 2019-03-26 ENCOUNTER — Other Ambulatory Visit: Payer: Self-pay | Admitting: Physician Assistant

## 2019-03-26 ENCOUNTER — Encounter (HOSPITAL_COMMUNITY)
Admission: EM | Disposition: A | Discharge status: Home Health | Payer: Self-pay | Source: Home / Self Care | Attending: Internal Medicine

## 2019-03-26 ENCOUNTER — Encounter (HOSPITAL_COMMUNITY): Payer: Self-pay | Admitting: Internal Medicine

## 2019-03-26 ENCOUNTER — Encounter (HOSPITAL_COMMUNITY): Payer: Medicaid Other

## 2019-03-26 ENCOUNTER — Encounter (HOSPITAL_BASED_OUTPATIENT_CLINIC_OR_DEPARTMENT_OTHER): Payer: Medicaid Other | Admitting: Internal Medicine

## 2019-03-26 ENCOUNTER — Inpatient Hospital Stay (HOSPITAL_COMMUNITY): Payer: Medicaid Other

## 2019-03-26 DIAGNOSIS — D62 Acute posthemorrhagic anemia: Secondary | ICD-10-CM | POA: Diagnosis not present

## 2019-03-26 DIAGNOSIS — Z9119 Patient's noncompliance with other medical treatment and regimen: Secondary | ICD-10-CM | POA: Diagnosis not present

## 2019-03-26 DIAGNOSIS — L97529 Non-pressure chronic ulcer of other part of left foot with unspecified severity: Secondary | ICD-10-CM | POA: Diagnosis present

## 2019-03-26 DIAGNOSIS — G8929 Other chronic pain: Secondary | ICD-10-CM | POA: Diagnosis present

## 2019-03-26 DIAGNOSIS — Z79899 Other long term (current) drug therapy: Secondary | ICD-10-CM | POA: Diagnosis not present

## 2019-03-26 DIAGNOSIS — M009 Pyogenic arthritis, unspecified: Secondary | ICD-10-CM | POA: Diagnosis present

## 2019-03-26 DIAGNOSIS — E669 Obesity, unspecified: Secondary | ICD-10-CM | POA: Diagnosis present

## 2019-03-26 DIAGNOSIS — L97419 Non-pressure chronic ulcer of right heel and midfoot with unspecified severity: Secondary | ICD-10-CM | POA: Diagnosis not present

## 2019-03-26 DIAGNOSIS — L97424 Non-pressure chronic ulcer of left heel and midfoot with necrosis of bone: Secondary | ICD-10-CM | POA: Diagnosis not present

## 2019-03-26 DIAGNOSIS — M10061 Idiopathic gout, right knee: Secondary | ICD-10-CM | POA: Diagnosis present

## 2019-03-26 DIAGNOSIS — I96 Gangrene, not elsewhere classified: Secondary | ICD-10-CM | POA: Diagnosis present

## 2019-03-26 DIAGNOSIS — M109 Gout, unspecified: Secondary | ICD-10-CM | POA: Diagnosis not present

## 2019-03-26 DIAGNOSIS — Z20822 Contact with and (suspected) exposure to covid-19: Secondary | ICD-10-CM | POA: Diagnosis present

## 2019-03-26 DIAGNOSIS — E08621 Diabetes mellitus due to underlying condition with foot ulcer: Secondary | ICD-10-CM | POA: Diagnosis not present

## 2019-03-26 DIAGNOSIS — Z7982 Long term (current) use of aspirin: Secondary | ICD-10-CM | POA: Diagnosis not present

## 2019-03-26 DIAGNOSIS — Z6830 Body mass index (BMI) 30.0-30.9, adult: Secondary | ICD-10-CM | POA: Diagnosis not present

## 2019-03-26 DIAGNOSIS — I10 Essential (primary) hypertension: Secondary | ICD-10-CM | POA: Diagnosis present

## 2019-03-26 DIAGNOSIS — N289 Disorder of kidney and ureter, unspecified: Secondary | ICD-10-CM | POA: Diagnosis present

## 2019-03-26 DIAGNOSIS — M869 Osteomyelitis, unspecified: Secondary | ICD-10-CM | POA: Diagnosis not present

## 2019-03-26 DIAGNOSIS — D638 Anemia in other chronic diseases classified elsewhere: Secondary | ICD-10-CM | POA: Diagnosis present

## 2019-03-26 DIAGNOSIS — E1152 Type 2 diabetes mellitus with diabetic peripheral angiopathy with gangrene: Secondary | ICD-10-CM | POA: Diagnosis present

## 2019-03-26 DIAGNOSIS — L97402 Non-pressure chronic ulcer of unspecified heel and midfoot with fat layer exposed: Secondary | ICD-10-CM | POA: Diagnosis not present

## 2019-03-26 DIAGNOSIS — E1169 Type 2 diabetes mellitus with other specified complication: Secondary | ICD-10-CM | POA: Diagnosis present

## 2019-03-26 DIAGNOSIS — B9689 Other specified bacterial agents as the cause of diseases classified elsewhere: Secondary | ICD-10-CM | POA: Diagnosis not present

## 2019-03-26 DIAGNOSIS — M1990 Unspecified osteoarthritis, unspecified site: Secondary | ICD-10-CM | POA: Diagnosis present

## 2019-03-26 DIAGNOSIS — M86272 Subacute osteomyelitis, left ankle and foot: Secondary | ICD-10-CM | POA: Diagnosis present

## 2019-03-26 DIAGNOSIS — E1165 Type 2 diabetes mellitus with hyperglycemia: Secondary | ICD-10-CM | POA: Diagnosis present

## 2019-03-26 DIAGNOSIS — Z79891 Long term (current) use of opiate analgesic: Secondary | ICD-10-CM | POA: Diagnosis not present

## 2019-03-26 DIAGNOSIS — Z7984 Long term (current) use of oral hypoglycemic drugs: Secondary | ICD-10-CM | POA: Diagnosis not present

## 2019-03-26 DIAGNOSIS — M1A0720 Idiopathic chronic gout, left ankle and foot, without tophus (tophi): Secondary | ICD-10-CM | POA: Diagnosis present

## 2019-03-26 DIAGNOSIS — E11621 Type 2 diabetes mellitus with foot ulcer: Secondary | ICD-10-CM | POA: Diagnosis present

## 2019-03-26 LAB — CBC
HCT: 27.5 % — ABNORMAL LOW (ref 39.0–52.0)
Hemoglobin: 9.4 g/dL — ABNORMAL LOW (ref 13.0–17.0)
MCH: 31.2 pg (ref 26.0–34.0)
MCHC: 34.2 g/dL (ref 30.0–36.0)
MCV: 91.4 fL (ref 80.0–100.0)
Platelets: 165 10*3/uL (ref 150–400)
RBC: 3.01 MIL/uL — ABNORMAL LOW (ref 4.22–5.81)
RDW: 15.8 % — ABNORMAL HIGH (ref 11.5–15.5)
WBC: 5.3 10*3/uL (ref 4.0–10.5)
nRBC: 0 % (ref 0.0–0.2)

## 2019-03-26 LAB — AEROBIC CULTURE W GRAM STAIN (SUPERFICIAL SPECIMEN): Gram Stain: NONE SEEN

## 2019-03-26 LAB — BASIC METABOLIC PANEL
Anion gap: 13 (ref 5–15)
BUN: 21 mg/dL (ref 8–23)
CO2: 24 mmol/L (ref 22–32)
Calcium: 9.7 mg/dL (ref 8.9–10.3)
Chloride: 102 mmol/L (ref 98–111)
Creatinine, Ser: 1.16 mg/dL (ref 0.61–1.24)
GFR calc Af Amer: >60 mL/min (ref >60)
GFR calc non Af Amer: >60 mL/min (ref >60)
Glucose, Bld: 198 mg/dL — ABNORMAL HIGH (ref 70–99)
Potassium: 4.3 mmol/L (ref 3.5–5.1)
Sodium: 139 mmol/L (ref 135–145)

## 2019-03-26 LAB — GLUCOSE, CAPILLARY
Glucose-Capillary: 196 mg/dL — ABNORMAL HIGH (ref 70–99)
Glucose-Capillary: 203 mg/dL — ABNORMAL HIGH (ref 70–99)
Glucose-Capillary: 169 mg/dL — ABNORMAL HIGH (ref 70–99)
Glucose-Capillary: 132 mg/dL — ABNORMAL HIGH (ref 70–99)

## 2019-03-26 SURGERY — AMPUTATION, FOOT, TRANSMETATARSAL
Anesthesia: Choice | Site: Toe | Laterality: Left

## 2019-03-26 MED ORDER — MUPIROCIN 2 % EX OINT
1.0000 "application " | TOPICAL_OINTMENT | Freq: Two times a day (BID) | CUTANEOUS | Status: AC
  Administered 2019-03-26 – 2019-03-30 (×6): 1 via NASAL
  Filled 2019-03-26 (×2): qty 22

## 2019-03-26 NOTE — Interval H&P Note (Signed)
History and Physical Interval Note:  03/26/2019 6:54 AM  [name]  has presented today for surgery, with the diagnosis of Osteomylitis.  The various methods of treatment have been discussed with the patient and family. After consideration of risks, benefits and other options for treatment, the patient has consented to  Procedure(s): Left TRANSMETATARSAL AMPUTATION (Left) as a surgical intervention.  The patient's history has been reviewed, patient examined, no change in status, stable for surgery.  I have reviewed the patient's chart and labs.  Questions were answered to the patient's satisfaction.     [name]

## 2019-03-26 NOTE — Progress Notes (Signed)
Pharmacy Antibiotic Note  [name] is a 64 y.o. male admitted on 03/25/2019 with hx osteomyelitis with recurrent infection. Pt Pharmacy has been consulted for vancomycin and Zosyn dosing to start AFTER surgery. OR planned for today cancelled due to pt eating and will be tomorrow morning. Planning to get tissues and bone for culturing to guide therapy.  Plan: -Will hold ABX tonight - OR tomorrow -Post/op will begin vancomycin (2067m IV x1 then 12546mIV q12h - est AUC 466) and Zosyn (3.375g IV EI q8h) -Will not enter orders for now in case perioperative antimicrobials are ordered or surgery is further delayed   Height: 6' 2"  (188 cm) Weight: 238 lb (108 kg) IBW/kg (Calculated) : 82.2  Temp (24hrs), Avg:98.3 F (36.8 C), Min:97.7 F (36.5 C), Max:98.7 F (37.1 C)  Recent Labs  Lab 03/25/19 1021 03/25/19 1223 03/26/19 0633  WBC 7.6  --  5.3  CREATININE 1.30*  --  1.16  LATICACIDVEN 1.4 0.9  --     Estimated Creatinine Clearance: 87.5 mL/min (by C-G formula based on SCr of 1.16 mg/dL).    Allergies  Allergen Reactions  . Other Hives    Anti-snake venim for copperhead venom.  . Pravastatin Other (See Comments)    Leg cramps, elevated CK  . Prednisone Other (See Comments)    Patient states "it runs my blood sugar up so I don't take it."  . Toradol [Ketorolac Tromethamine] Hives and Nausea And Vomiting    Pill form only    Antimicrobials this admission: Zosyn 3/13 >>  Vancomycin 3/13 >>    Microbiology results: 3/11 BCx: NGTD  Thank you for allowing pharmacy to be a part of this patient's care.  MiArrie SenatePharmD, BCPS Clinical Pharmacist [phone] check AMION for all [place] 03/26/2019

## 2019-03-26 NOTE — Progress Notes (Signed)
Per [name], patient will need to be rescheduled due to patient having breakfast this AM.

## 2019-03-26 NOTE — Progress Notes (Signed)
Pt refused his lasix this AM, educated him and he stated he still did not want to take it that he received one yesterday and he would be peeing nonstop and he is supposed to be going to surgery. MD notified

## 2019-03-26 NOTE — Progress Notes (Addendum)
[facility] for Infectious Disease    Date of Admission:  03/25/2019   Total days of antibiotics 2           ID: [name] is a 64 y.o. male with  Principal Problem:   Diabetic foot ulcer ([place]) Active Problems:   DM (diabetes mellitus), type 2, uncontrolled ([place])   Essential hypertension, benign   Subacute osteomyelitis, left ankle and foot ([facility])   Idiopathic chronic gout of left foot without tophus    Subjective: Drainage from his left  foot increased overnight. Denies fever, chills,nightsweat. He has consented to do a TMA of right foot by dr duda. Did eat breakfast thus he is bumped from surgery till later in the day  Medications:  . allopurinol  300 mg Oral Daily  . amitriptyline  25 mg Oral QHS  . aspirin EC  81 mg Oral Daily  . chlorhexidine  60 mL Topical Once  . enoxaparin (LOVENOX) injection  40 mg Subcutaneous Q24H  . furosemide  20 mg Oral Daily  . gabapentin  300 mg Oral TID  . insulin aspart  0-15 Units Subcutaneous TID WC  . insulin aspart  0-5 Units Subcutaneous QHS  . insulin detemir  40 Units Subcutaneous QHS  . lisinopril  40 mg Oral Daily  . metoprolol succinate  100 mg Oral Daily  . mupirocin ointment  1 application Nasal BID  . povidone-iodine  2 application Topical Once    Objective: Vital signs in last 24 hours: Temp:  [97.7 F (36.5 C)-98.7 F (37.1 C)] 98.5 F (36.9 C) (03/12 1203) Pulse Rate:  [60-81] 69 (03/12 1203) Resp:  [18] 18 (03/12 1203) BP: (99-130)/(60-81) 99/75 (03/12 1203) SpO2:  [96 %-100 %] 100 % (03/12 1203) Weight:  [035 kg] 108 kg (03/12 0800) Physical Exam  Constitutional: He is oriented to person, place, and time. He appears well-developed and well-nourished. No distress.  HENT:  Mouth/Throat: Oropharynx is clear and moist. No oropharyngeal exudate.  Cardiovascular: Normal rate, regular rhythm and normal heart sounds. Exam reveals no gallop and no friction rub.  No murmur heard.  Pulmonary/Chest: Effort  normal and breath sounds normal. No respiratory distress. He has no wheezes.  Abdominal: Soft. Bowel sounds are normal. He exhibits no distension. There is no tenderness.  DHR:CBUL foot drainage with hemorrhagic blisters to toes  Psychiatric: He has a normal mood and affect. His behavior is normal.    Lab Results Recent Labs    03/25/19 1021 03/26/19 0633  WBC 7.6 5.3  HGB 10.2* 9.4*  HCT 29.8* 27.5*  NA 135 139  K 5.4* 4.3  CL 102 102  CO2 21* 24  BUN 29* 21  CREATININE 1.30* 1.16   Liver Panel Recent Labs    03/25/19 1021  PROT 8.0  ALBUMIN 3.7  AST 15  ALT 11  ALKPHOS 70  BILITOT 0.6   Sedimentation Rate Recent Labs    03/25/19 1446  ESRSEDRATE 81*   C-Reactive Protein Recent Labs    03/25/19 1446  CRP 1.9*    Microbiology: Polymicrobial -wound cx Studies/Results: DG Foot Complete Left  Result Date: 03/25/2019 CLINICAL DATA:  Possible foot infection.  Diabetic ulcer EXAM: LEFT FOOT - COMPLETE 3+ VIEW COMPARISON:  03/29/2017 FINDINGS: The first through third MTP joints are widened with sclerosis, spurring, and deformity of the adjacent bone. This is new from prior and new from a report at [facility] in 2020. The regional soft tissues are thickened. No opaque foreign body  or fracture. Diffuse arterial calcification with osteopenia. IMPRESSION: Severe arthropathy of the first through third MTP joints that is new from 2020. 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 Electronically Signed   By: Monte Fantasia M.D.   On: 03/25/2019 10:52   VAS [place] LOWER EXTREMITY VENOUS (DVT) (ONLY [facility] & [facility])  Result Date: 03/25/2019  Lower Venous DVTStudy Indications: Diabetic foot ulcer, and Swelling.  Limitations: Edema. Comparison Study: No prior study on file Performing Technologist: Sharion Dove RVS  Examination Guidelines: A complete evaluation includes B-mode imaging, spectral Doppler,  color Doppler, and power Doppler as needed of all accessible portions of each vessel. Bilateral testing is considered an integral part of a complete examination. Limited examinations for reoccurring indications may be performed as noted. The reflux portion of the exam is performed with the patient in reverse Trendelenburg.  +-----+---------------+---------+-----------+----------+--------------+ RIGHTCompressibilityPhasicitySpontaneityPropertiesThrombus Aging +-----+---------------+---------+-----------+----------+--------------+ CFV  Full           Yes      Yes                                 +-----+---------------+---------+-----------+----------+--------------+   +---------+---------------+---------+-----------+----------+--------------+ LEFT     CompressibilityPhasicitySpontaneityPropertiesThrombus Aging +---------+---------------+---------+-----------+----------+--------------+ CFV      Full           Yes      Yes                                 +---------+---------------+---------+-----------+----------+--------------+ SFJ      Full                                                        +---------+---------------+---------+-----------+----------+--------------+ FV Prox  Full                                                        +---------+---------------+---------+-----------+----------+--------------+ FV Mid   Full                                                        +---------+---------------+---------+-----------+----------+--------------+ FV DistalFull                                                        +---------+---------------+---------+-----------+----------+--------------+ PFV      Full                                                        +---------+---------------+---------+-----------+----------+--------------+ POP      Full  Yes      Yes                                  +---------+---------------+---------+-----------+----------+--------------+ PTV      Full                                                        +---------+---------------+---------+-----------+----------+--------------+ PERO                                                  Not visualized +---------+---------------+---------+-----------+----------+--------------+     *See table(s) above for measurements and observations. Electronically signed by Monica Martinez MD on 03/25/2019 at 4:33:22 PM.    Final      Assessment/Plan: Dfu/osteomyelitis of left foot = going to surgery, recommend to send tissue and bone for culture. If any residual infection, may need to still get picc line to treat for osteomyelitis. Await surgery/op note. After surgery, can start vancomycin and piptazo (given past hx of pathogens)  Dr Megan Salon to provide further recs. tomorrow  [facility] Cell: [phone] Pager: [phone]  03/26/2019, 2:31 PM

## 2019-03-26 NOTE — H&P (View-Only) (Signed)
Patient ID: [name], male   DOB: [birth year], 64 y.o.   MRN: [id number] Patient was scheduled for left transmetatarsal amputation is however patient did have breakfast and surgery is now scheduled for Saturday morning at 730.  Again discussed the importance with patient n.p.o. after midnight.  Patient has progressive ischemic changes in the plantar aspect of his toes only the fourth toe does not have ischemic gangrenous changes.  He still has the ischemic ulcer over the metatarsal heads and a small draining ulcer proximal to the metatarsal heads on the plantar aspect.  Discussed that we would proceed with a transmetatarsal amputation placement of wound VAC and patient could discharge to home once he can ambulate without weightbearing on the foot.  Patient states he understands wished to proceed with surgery at this time risks and benefits were discussed.

## 2019-03-26 NOTE — Progress Notes (Addendum)
Progress Note    [name]  JEH:[id number] DOB: [birth year]  DOA: 03/25/2019 PCP: System, Pcp Not In    Brief Narrative:     Medical records reviewed and are as summarized below:  [name] is an 64 y.o. male with hx DM, HTN, chronic pain on daily opioids and hx osteomyelitis of the R humerus at [facility] in 2015 and osteomyelitis of the left foot in 2020 who presents with slowly progressive redness and swelling of the left foot.  The patient is only able to give vague details about his left foot infection, but able to articulate that he has had this infection for "about 2 years", it has been debrided at least once, and he has had a PICC line for extended IV antibiotics at least once.  Since last on IV antibiotics, which he estimates about 9 months ago, his foot has been well until the beginning of this year when it became red and discolored again.  At that time he was referred to the hyperbaric clinic in [place] where he started to go regularly.  Recent cultures (presumably surface swabs?) from their clinic showed polymicrobial infection.  Started on cephalexin without improvement.  Assessment/Plan:   Principal Problem:   Diabetic foot ulcer ([place]) Active Problems:   DM (diabetes mellitus), type 2, uncontrolled ([place])   Essential hypertension, benign   Subacute osteomyelitis, left ankle and foot (HCC)   Idiopathic chronic gout of left foot without tophus   Osteomyelitis of the left foot - [facility]: noted that the patient had had an operative debridement for osteomyelitis in Dec 2020 -Records have been requested from [place] still pending -ID consult appreciated: They recommend ABI and consult with orthopedics for possible TMA -Zosyn and vancomycin was recommended by ID yesterday but it appears they want for after surgery  -Plan is for surgery in the a.m.  Renal insufficiency -Improved overnight Monitor daily  Normocytic  anemia Likely due to chronic infection.  No clinical blood loss.  Diabetes with polyneuropathy -Continue home Levemir, reduced dose to 40 daily -Sliding scale correction insulin ordered -Needs strict control to help with healing -Continue baby aspirin -Continue amitriptyline and gabapentin  Hypertension -Continue  Lasix, lisinopril, metoprolol -Blood pressure on the lower end of normal  Gout -Continue allopurinol  obesity Body mass index is 30.56 kg/m.   Family Communication/Anticipated D/C date and plan/Code Status   DVT prophylaxis: Lovenox ordered. Code Status: Full Code.  Family Communication: Patient declined phone call to family Disposition Plan: Plan is for surgery in the a.m.   Medical Consultants:    ID  [name]     Subjective:   Patient ready for surgery tomorrow at 7 AM  Objective:    Vitals:   03/26/19 0556 03/26/19 0800 03/26/19 1009 03/26/19 1203  BP: 116/69  112/60 99/75  Pulse: 70   69  Resp: 18   18  Temp: 97.7 F (36.5 C)   98.5 F (36.9 C)  TempSrc: Oral   Oral  SpO2: 100%   100%  Weight:  108 kg    Height:  6' 2"  (1.88 m)      Intake/Output Summary (Last 24 hours) at 03/26/2019 1423 Last data filed at 03/26/2019 0330 Gross per 24 hour  Intake 1956.25 ml  Output --  Net 1956.25 ml   Filed Weights   03/26/19 0800  Weight: 108 kg    Exam: In bed, no acute distress Regular rate and rhythm No increased work of  breathing Alert and oriented x3 Patient has redness of his left foot with the blister on the ball and a ulcer in the central area  Data Reviewed:   I have personally reviewed following labs and imaging studies:  Labs: Labs show the following:   Basic Metabolic Panel: Recent Labs  Lab 03/25/19 1021 03/26/19 0633  NA 135 139  K 5.4* 4.3  CL 102 102  CO2 21* 24  GLUCOSE 180* 198*  BUN 29* 21  CREATININE 1.30* 1.16  CALCIUM 10.2 9.7   GFR Estimated Creatinine Clearance: 87.5 mL/min (by C-G formula  based on SCr of 1.16 mg/dL). Liver Function Tests: Recent Labs  Lab 03/25/19 1021  AST 15  ALT 11  ALKPHOS 70  BILITOT 0.6  PROT 8.0  ALBUMIN 3.7   No results for input(s): LIPASE, AMYLASE in the last 168 hours. No results for input(s): AMMONIA in the last 168 hours. Coagulation profile Recent Labs  Lab 03/25/19 1021  INR 1.1    CBC: Recent Labs  Lab 03/25/19 1021 03/26/19 0633  WBC 7.6 5.3  NEUTROABS 5.4  --   HGB 10.2* 9.4*  HCT 29.8* 27.5*  MCV 92.0 91.4  PLT 188 165   Cardiac Enzymes: No results for input(s): CKTOTAL, CKMB, CKMBINDEX, TROPONINI in the last 168 hours. BNP (last 3 results) No results for input(s): PROBNP in the last 8760 hours. CBG: Recent Labs  Lab 03/25/19 1012 03/25/19 1636 03/25/19 2122 03/26/19 0601 03/26/19 1104  GLUCAP 159* 124* 170* 196* 203*   D-Dimer: No results for input(s): DDIMER in the last 72 hours. Hgb A1c: Recent Labs    03/25/19 1021  HGBA1C 7.2*   Lipid Profile: No results for input(s): CHOL, HDL, LDLCALC, TRIG, CHOLHDL, LDLDIRECT in the last 72 hours. Thyroid function studies: No results for input(s): TSH, T4TOTAL, T3FREE, THYROIDAB in the last 72 hours.  Invalid input(s): FREET3 Anemia work up: No results for input(s): VITAMINB12, FOLATE, FERRITIN, TIBC, IRON, RETICCTPCT in the last 72 hours. Sepsis Labs: Recent Labs  Lab 03/25/19 1021 03/25/19 1223 03/26/19 0633  WBC 7.6  --  5.3  LATICACIDVEN 1.4 0.9  --     Microbiology Recent Results (from the past 240 hour(s))  Aerobic Culture (superficial specimen)     Status: None   Collection Time: 03/19/19  1:20 PM   Specimen: Wound  Result Value Ref Range Status   Specimen Description   Final    WOUND LEFT 2ND TOE Performed at [facility], [place] [address], [place], [place] [address]    Special Requests   Final    NONE Performed at [facility], [place] [address], [place], [place] [address]    Gram Stain   Final     NO WBC SEEN NO ORGANISMS SEEN Performed at [facility], [place] [address] [place], [place] [address]    Culture   Final    RARE ENTEROBACTER AEROGENES RARE CANDIDA PARAPSILOSIS RARE ALCALIGENES FAECALIS    Report Status 03/26/2019 FINAL  Final   Organism ID, Bacteria ENTEROBACTER AEROGENES  Final   Organism ID, Bacteria ALCALIGENES FAECALIS  Final      Susceptibility   Alcaligenes faecalis - MIC*    CEFAZOLIN 32 INTERMEDIATE Intermediate     GENTAMICIN 2 SENSITIVE Sensitive     CIPROFLOXACIN 1 SENSITIVE Sensitive     IMIPENEM 0.5 SENSITIVE Sensitive     TRIMETH/SULFA >=320 RESISTANT Resistant     * RARE ALCALIGENES FAECALIS   Enterobacter aerogenes -  MIC*    CEFAZOLIN >=64 RESISTANT Resistant     CEFEPIME >=32 RESISTANT Resistant     CEFTAZIDIME <=1 SENSITIVE Sensitive     CEFTRIAXONE >=64 RESISTANT Resistant     CIPROFLOXACIN 0.5 SENSITIVE Sensitive     GENTAMICIN <=1 SENSITIVE Sensitive     IMIPENEM 2 SENSITIVE Sensitive     TRIMETH/SULFA <=20 SENSITIVE Sensitive     PIP/TAZO 8 SENSITIVE Sensitive     * RARE ENTEROBACTER AEROGENES  Culture, blood (Routine x 2)     Status: None (Preliminary result)   Collection Time: 03/25/19 10:20 AM   Specimen: BLOOD RIGHT HAND  Result Value Ref Range Status   Specimen Description BLOOD RIGHT HAND  Final   Special Requests   Final    BOTTLES DRAWN AEROBIC AND ANAEROBIC Blood Culture adequate volume   Culture   Final    NO GROWTH < 24 HOURS Performed at [facility], [place] [address] [place], [place] [address]    Report Status PENDING  Incomplete  SARS CORONAVIRUS 2 (TAT 6-24 HRS) Nasopharyngeal Nasopharyngeal Swab     Status: None   Collection Time: 03/25/19 12:51 PM   Specimen: Nasopharyngeal Swab  Result Value Ref Range Status   SARS Coronavirus 2 NEGATIVE NEGATIVE Final    Comment: (NOTE) SARS-CoV-2 target nucleic acids are NOT DETECTED. The SARS-CoV-2 RNA is generally detectable in upper and lower respiratory  specimens during the acute phase of infection. Negative results do not preclude SARS-CoV-2 infection, do not rule out co-infections with other pathogens, and should not be used as the sole basis for treatment or other patient management decisions. Negative results must be combined with clinical observations, patient history, and epidemiological information. The expected result is Negative. Fact Sheet for Patients: SugarRoll.be Fact Sheet for Healthcare Providers: https://www.woods-mathews.com/ This test is not yet approved or cleared by the [place] FDA and  has been authorized for detection and/or diagnosis of SARS-CoV-2 by FDA under an Emergency Use Authorization (EUA). This EUA will remain  in effect (meaning this test can be used) for the duration of the COVID-19 declaration under Section 56 4(b)(1) of the Act, 21 U.S.C. section 360bbb-3(b)(1), unless the authorization is terminated or revoked sooner. Performed at [facility], [place] [address] [place], [place] [address]   Culture, blood (Routine x 2)     Status: None (Preliminary result)   Collection Time: 03/25/19  1:01 PM   Specimen: BLOOD  Result Value Ref Range Status   Specimen Description BLOOD LEFT ANTECUBITAL  Final   Special Requests   Final    BOTTLES DRAWN AEROBIC AND ANAEROBIC Blood Culture results may not be optimal due to an excessive volume of blood received in culture bottles   Culture   Final    NO GROWTH < 24 HOURS Performed at [facility], [place] [address] [place],  [address]    Report Status PENDING  Incomplete  MRSA PCR Screening     Status: None   Collection Time: 03/25/19  7:54 PM   Specimen: Nasal Mucosa; Nasopharyngeal  Result Value Ref Range Status   MRSA by PCR NEGATIVE NEGATIVE Final    Comment:        The GeneXpert MRSA Assay (FDA approved for NASAL specimens only), is one component of a comprehensive MRSA  colonization surveillance program. It is not intended to diagnose MRSA infection nor to guide or monitor treatment for MRSA infections. Performed at [facility], [place] [address]  [place], [place] [address]     Procedures and diagnostic studies:  DG Foot Complete Left  Result Date: 03/25/2019 CLINICAL DATA:  Possible foot infection.  Diabetic ulcer EXAM: LEFT FOOT - COMPLETE 3+ VIEW COMPARISON:  03/29/2017 FINDINGS: The first through third MTP joints are widened with sclerosis, spurring, and deformity of the adjacent bone. This is new from prior and new from a report at [facility] in 2020. The regional soft tissues are thickened. No opaque foreign body or fracture. Diffuse arterial calcification with osteopenia. IMPRESSION: Severe arthropathy of the first through third MTP joints that is new from 2020. 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 Electronically Signed   By: Monte Fantasia M.D.   On: 03/25/2019 10:52   VAS [place] LOWER EXTREMITY VENOUS (DVT) (ONLY [facility] & [facility])  Result Date: 03/25/2019  Lower Venous DVTStudy Indications: Diabetic foot ulcer, and Swelling.  Limitations: Edema. Comparison Study: No prior study on file Performing Technologist: Sharion Dove RVS  Examination Guidelines: A complete evaluation includes B-mode imaging, spectral Doppler, color Doppler, and power Doppler as needed of all accessible portions of each vessel. Bilateral testing is considered an integral part of a complete examination. Limited examinations for reoccurring indications may be performed as noted. The reflux portion of the exam is performed with the patient in reverse Trendelenburg.  +-----+---------------+---------+-----------+----------+--------------+ RIGHTCompressibilityPhasicitySpontaneityPropertiesThrombus Aging +-----+---------------+---------+-----------+----------+--------------+ CFV  Full            Yes      Yes                                 +-----+---------------+---------+-----------+----------+--------------+   +---------+---------------+---------+-----------+----------+--------------+ LEFT     CompressibilityPhasicitySpontaneityPropertiesThrombus Aging +---------+---------------+---------+-----------+----------+--------------+ CFV      Full           Yes      Yes                                 +---------+---------------+---------+-----------+----------+--------------+ SFJ      Full                                                        +---------+---------------+---------+-----------+----------+--------------+ FV Prox  Full                                                        +---------+---------------+---------+-----------+----------+--------------+ FV Mid   Full                                                        +---------+---------------+---------+-----------+----------+--------------+ FV DistalFull                                                        +---------+---------------+---------+-----------+----------+--------------+  PFV      Full                                                        +---------+---------------+---------+-----------+----------+--------------+ POP      Full           Yes      Yes                                 +---------+---------------+---------+-----------+----------+--------------+ PTV      Full                                                        +---------+---------------+---------+-----------+----------+--------------+ PERO                                                  Not visualized +---------+---------------+---------+-----------+----------+--------------+     *See table(s) above for measurements and observations. Electronically signed by Monica Martinez MD on 03/25/2019 at 4:33:22 PM.    Final     Medications:   . allopurinol  300 mg Oral Daily  . amitriptyline  25 mg Oral  QHS  . amLODipine  5 mg Oral Daily  . aspirin EC  81 mg Oral Daily  . chlorhexidine  60 mL Topical Once  . enoxaparin (LOVENOX) injection  40 mg Subcutaneous Q24H  . furosemide  20 mg Oral Daily  . gabapentin  300 mg Oral TID  . insulin aspart  0-15 Units Subcutaneous TID WC  . insulin aspart  0-5 Units Subcutaneous QHS  . insulin detemir  40 Units Subcutaneous QHS  . lisinopril  40 mg Oral Daily  . metoprolol succinate  100 mg Oral Daily  . mupirocin ointment  1 application Nasal BID  . povidone-iodine  2 application Topical Once   Continuous Infusions: .  ceFAZolin (ANCEF) IV       LOS: 0 days   Geradine Girt  [facility]   How to contact the [facility] Attending or Consulting provider [place] or covering provider during after hours [place], for this patient?  1. Check the care team in [facility] and look for a) attending/consulting TR[facility] provider listed and b) the [facility] team listed 2. Log into [email or web address] and use [place]'s universal password to access. If you do not have the password, please contact the hospital operator. 3. Locate the [facility] provider you are looking for under [facility] and page to a number that you can be directly reached. 4. If you still have difficulty reaching the provider, please page the [facility] (Director on Call) for the Hospitalists listed on amion for assistance.  03/26/2019, 2:23 PM

## 2019-03-26 NOTE — Progress Notes (Signed)
ABI completed. Refer to "CV Proc" under chart review to view preliminary results.  03/26/2019 4:44 PM Kelby Aline., MHA, RVT, RDCS, RDMS

## 2019-03-26 NOTE — Progress Notes (Signed)
Patient ID: [name], male   DOB: Nov 11, 1955, 64 y.o.   MRN: [id number] Patient was scheduled for left transmetatarsal amputation is however patient did have breakfast and surgery is now scheduled for Saturday morning at 730.  Again discussed the importance with patient n.p.o. after midnight.  Patient has progressive ischemic changes in the plantar aspect of his toes only the fourth toe does not have ischemic gangrenous changes.  He still has the ischemic ulcer over the metatarsal heads and a small draining ulcer proximal to the metatarsal heads on the plantar aspect.  Discussed that we would proceed with a transmetatarsal amputation placement of wound VAC and patient could discharge to home once he can ambulate without weightbearing on the foot.  Patient states he understands wished to proceed with surgery at this time risks and benefits were discussed.

## 2019-03-26 NOTE — Progress Notes (Signed)
Kristy night shift RN and myself walked into the pts room for report this AM and noticed that there was a breakfast tray in the room and it had been eaten. Darrell [place] RN had told pt numerous times prior to that that he was NPO and he was going to surgery this AM. I called [name] and notified him and changed the pt to NPO he stated that " they would try and get to him later in the day".

## 2019-03-27 ENCOUNTER — Encounter (HOSPITAL_COMMUNITY)
Admission: EM | Disposition: A | Discharge status: Home Health | Payer: Self-pay | Source: Home / Self Care | Attending: Internal Medicine

## 2019-03-27 ENCOUNTER — Inpatient Hospital Stay (HOSPITAL_COMMUNITY): Payer: Medicaid Other | Admitting: Anesthesiology

## 2019-03-27 HISTORY — PX: TRANSMETATARSAL AMPUTATION: SHX6197

## 2019-03-27 LAB — CBC
HCT: 27.8 % — ABNORMAL LOW (ref 39.0–52.0)
Hemoglobin: 9.4 g/dL — ABNORMAL LOW (ref 13.0–17.0)
MCH: 30.9 pg (ref 26.0–34.0)
MCHC: 33.8 g/dL (ref 30.0–36.0)
MCV: 91.4 fL (ref 80.0–100.0)
Platelets: 143 10*3/uL — ABNORMAL LOW (ref 150–400)
RBC: 3.04 MIL/uL — ABNORMAL LOW (ref 4.22–5.81)
RDW: 15.7 % — ABNORMAL HIGH (ref 11.5–15.5)
WBC: 4.9 10*3/uL (ref 4.0–10.5)
nRBC: 0 % (ref 0.0–0.2)

## 2019-03-27 LAB — BASIC METABOLIC PANEL
Anion gap: 10 (ref 5–15)
BUN: 20 mg/dL (ref 8–23)
CO2: 25 mmol/L (ref 22–32)
Calcium: 9.6 mg/dL (ref 8.9–10.3)
Chloride: 103 mmol/L (ref 98–111)
Creatinine, Ser: 1.24 mg/dL (ref 0.61–1.24)
GFR calc Af Amer: >60 mL/min (ref >60)
GFR calc non Af Amer: >60 mL/min (ref >60)
Glucose, Bld: 167 mg/dL — ABNORMAL HIGH (ref 70–99)
Potassium: 4.7 mmol/L (ref 3.5–5.1)
Sodium: 138 mmol/L (ref 135–145)

## 2019-03-27 LAB — GLUCOSE, CAPILLARY
Glucose-Capillary: 142 mg/dL — ABNORMAL HIGH (ref 70–99)
Glucose-Capillary: 152 mg/dL — ABNORMAL HIGH (ref 70–99)
Glucose-Capillary: 149 mg/dL — ABNORMAL HIGH (ref 70–99)
Glucose-Capillary: 169 mg/dL — ABNORMAL HIGH (ref 70–99)
Glucose-Capillary: 149 mg/dL — ABNORMAL HIGH (ref 70–99)
Glucose-Capillary: 158 mg/dL — ABNORMAL HIGH (ref 70–99)

## 2019-03-27 SURGERY — AMPUTATION, FOOT, TRANSMETATARSAL
Anesthesia: Monitor Anesthesia Care | Site: Foot | Laterality: Left

## 2019-03-27 MED ORDER — VANCOMYCIN HCL 1250 MG/250ML IV SOLN
1250.0000 mg | Freq: Two times a day (BID) | INTRAVENOUS | Status: DC
  Administered 2019-03-27 – 2019-03-28 (×2): 1250 mg via INTRAVENOUS
  Filled 2019-03-27 (×2): qty 250

## 2019-03-27 MED ORDER — MAGNESIUM CITRATE PO SOLN: 1.0000 | Freq: Once | ORAL | Status: DC | PRN

## 2019-03-27 MED ORDER — PROPOFOL 500 MG/50ML IV EMUL
INTRAVENOUS | Status: DC | PRN
  Administered 2019-03-27: 75 ug/kg/min via INTRAVENOUS

## 2019-03-27 MED ORDER — VANCOMYCIN HCL 2000 MG/400ML IV SOLN
2000.0000 mg | Freq: Once | INTRAVENOUS | Status: AC
  Administered 2019-03-27: 11:00:00 2000 mg via INTRAVENOUS
  Filled 2019-03-27: qty 400

## 2019-03-27 MED ORDER — DOCUSATE SODIUM 100 MG PO CAPS
100.0000 mg | ORAL_CAPSULE | Freq: Two times a day (BID) | ORAL | Status: DC
  Administered 2019-03-27 – 2019-03-31 (×9): 100 mg via ORAL
  Filled 2019-03-27 (×9): qty 1

## 2019-03-27 MED ORDER — 0.9 % SODIUM CHLORIDE (POUR BTL) OPTIME
TOPICAL | Status: DC | PRN
  Administered 2019-03-27: 1000 mL

## 2019-03-27 MED ORDER — ONDANSETRON HCL 4 MG PO TABS: 4.0000 mg | ORAL_TABLET | Freq: Four times a day (QID) | ORAL | Status: DC | PRN

## 2019-03-27 MED ORDER — PROPOFOL 10 MG/ML IV BOLUS
INTRAVENOUS | Status: DC | PRN
  Administered 2019-03-27: 30 mg via INTRAVENOUS
  Administered 2019-03-27: 50 mg via INTRAVENOUS

## 2019-03-27 MED ORDER — FENTANYL CITRATE (PF) 100 MCG/2ML IJ SOLN
INTRAMUSCULAR | Status: DC | PRN
  Administered 2019-03-27: 50 ug via INTRAVENOUS
  Administered 2019-03-27: 100 ug via INTRAVENOUS

## 2019-03-27 MED ORDER — SODIUM CHLORIDE 0.9 % IV SOLN: INTRAVENOUS | Status: DC

## 2019-03-27 MED ORDER — CEFAZOLIN SODIUM-DEXTROSE 2-4 GM/100ML-% IV SOLN
2.0000 g | INTRAVENOUS | Status: DC
  Filled 2019-03-27: qty 100

## 2019-03-27 MED ORDER — PHENYLEPHRINE 40 MCG/ML (10ML) SYRINGE FOR IV PUSH (FOR BLOOD PRESSURE SUPPORT)
PREFILLED_SYRINGE | INTRAVENOUS | Status: DC | PRN
  Administered 2019-03-27 (×6): 80 ug via INTRAVENOUS

## 2019-03-27 MED ORDER — ALBUMIN HUMAN 5 % IV SOLN: INTRAVENOUS | Status: DC | PRN

## 2019-03-27 MED ORDER — HYDROMORPHONE HCL 1 MG/ML IJ SOLN
0.5000 mg | INTRAMUSCULAR | Status: DC | PRN
  Administered 2019-03-27 – 2019-03-30 (×16): 1 mg via INTRAVENOUS
  Filled 2019-03-27 (×16): qty 1

## 2019-03-27 MED ORDER — PHENYLEPHRINE 40 MCG/ML (10ML) SYRINGE FOR IV PUSH (FOR BLOOD PRESSURE SUPPORT)
PREFILLED_SYRINGE | INTRAVENOUS | Status: AC
  Filled 2019-03-27: qty 10

## 2019-03-27 MED ORDER — POLYETHYLENE GLYCOL 3350 17 G PO PACK: 17.0000 g | PACK | Freq: Every day | ORAL | Status: DC | PRN

## 2019-03-27 MED ORDER — BISACODYL 10 MG RE SUPP: 10.0000 mg | Freq: Every day | RECTAL | Status: DC | PRN

## 2019-03-27 MED ORDER — PROPOFOL 10 MG/ML IV BOLUS
INTRAVENOUS | Status: AC
  Filled 2019-03-27: qty 20

## 2019-03-27 MED ORDER — CEFAZOLIN SODIUM-DEXTROSE 2-3 GM-%(50ML) IV SOLR
INTRAVENOUS | Status: DC | PRN
  Administered 2019-03-27: 2 g via INTRAVENOUS

## 2019-03-27 MED ORDER — METHOCARBAMOL 1000 MG/10ML IJ SOLN
500.0000 mg | Freq: Four times a day (QID) | INTRAVENOUS | Status: DC | PRN
  Filled 2019-03-27: qty 5

## 2019-03-27 MED ORDER — METOCLOPRAMIDE HCL 5 MG/ML IJ SOLN: 5.0000 mg | Freq: Three times a day (TID) | INTRAMUSCULAR | Status: DC | PRN

## 2019-03-27 MED ORDER — OXYCODONE HCL 5 MG PO TABS
5.0000 mg | ORAL_TABLET | ORAL | Status: DC | PRN
  Administered 2019-03-27: 14:00:00 10 mg via ORAL
  Filled 2019-03-27 (×4): qty 2

## 2019-03-27 MED ORDER — METOCLOPRAMIDE HCL 5 MG PO TABS: 5.0000 mg | ORAL_TABLET | Freq: Three times a day (TID) | ORAL | Status: DC | PRN

## 2019-03-27 MED ORDER — MIDAZOLAM HCL 5 MG/5ML IJ SOLN
INTRAMUSCULAR | Status: DC | PRN
  Administered 2019-03-27: 2 mg via INTRAVENOUS

## 2019-03-27 MED ORDER — METHOCARBAMOL 500 MG PO TABS
500.0000 mg | ORAL_TABLET | Freq: Four times a day (QID) | ORAL | Status: DC | PRN
  Administered 2019-03-29 – 2019-03-31 (×3): 500 mg via ORAL
  Filled 2019-03-27 (×3): qty 1

## 2019-03-27 MED ORDER — CHLORHEXIDINE GLUCONATE 4 % EX LIQD
60.0000 mL | Freq: Once | CUTANEOUS | Status: DC
  Filled 2019-03-27: qty 60

## 2019-03-27 MED ORDER — LIDOCAINE 2% (20 MG/ML) 5 ML SYRINGE
INTRAMUSCULAR | Status: DC | PRN
  Administered 2019-03-27: 40 mg via INTRAVENOUS

## 2019-03-27 MED ORDER — ACETAMINOPHEN 325 MG PO TABS: 325.0000 mg | ORAL_TABLET | Freq: Four times a day (QID) | ORAL | Status: DC | PRN

## 2019-03-27 MED ORDER — OXYCODONE HCL 5 MG PO TABS
10.0000 mg | ORAL_TABLET | ORAL | Status: DC | PRN
  Administered 2019-03-27 – 2019-03-28 (×5): 10 mg via ORAL
  Administered 2019-03-29 – 2019-03-31 (×7): 15 mg via ORAL
  Filled 2019-03-27 (×3): qty 3
  Filled 2019-03-27: qty 2
  Filled 2019-03-27 (×3): qty 3
  Filled 2019-03-27 (×2): qty 2
  Filled 2019-03-27: qty 3

## 2019-03-27 MED ORDER — ONDANSETRON HCL 4 MG/2ML IJ SOLN: 4.0000 mg | Freq: Four times a day (QID) | INTRAMUSCULAR | Status: DC | PRN

## 2019-03-27 MED ORDER — LACTATED RINGERS IV SOLN: INTRAVENOUS | Status: DC | PRN

## 2019-03-27 MED ORDER — FENTANYL CITRATE (PF) 250 MCG/5ML IJ SOLN
INTRAMUSCULAR | Status: AC
  Filled 2019-03-27: qty 5

## 2019-03-27 MED ORDER — MIDAZOLAM HCL 2 MG/2ML IJ SOLN
INTRAMUSCULAR | Status: AC
  Filled 2019-03-27: qty 2

## 2019-03-27 MED ORDER — LIDOCAINE 2% (20 MG/ML) 5 ML SYRINGE
INTRAMUSCULAR | Status: AC
  Filled 2019-03-27: qty 5

## 2019-03-27 MED ORDER — PIPERACILLIN-TAZOBACTAM 3.375 G IVPB
3.3750 g | Freq: Three times a day (TID) | INTRAVENOUS | Status: DC
  Administered 2019-03-27 – 2019-03-29 (×6): 3.375 g via INTRAVENOUS
  Filled 2019-03-27 (×8): qty 50

## 2019-03-27 SURGICAL SUPPLY — 31 items
BLADE SAW SGTL MED 73X18.5 STR (BLADE) ×2 IMPLANT
BLADE SURG 21 STRL SS (BLADE) ×3 IMPLANT
CANISTER WOUND CARE 500ML ATS (WOUND CARE) ×2 IMPLANT
COVER SURGICAL LIGHT HANDLE (MISCELLANEOUS) ×4 IMPLANT
DRAPE U-SHAPE 47X51 STRL (DRAPES) ×4 IMPLANT
DRESSING PEEL AND PLC PRVNA 13 (GAUZE/BANDAGES/DRESSINGS) IMPLANT
DRESSING PREVENA PLUS CUSTOM (GAUZE/BANDAGES/DRESSINGS) IMPLANT
DRSG PEEL AND PLACE PREVENA 13 (GAUZE/BANDAGES/DRESSINGS)
DRSG PREVENA PLUS CUSTOM (GAUZE/BANDAGES/DRESSINGS) ×3
DURAPREP 26ML APPLICATOR (WOUND CARE) ×3 IMPLANT
ELECT REM PT RETURN 9FT ADLT (ELECTROSURGICAL) ×3
ELECTRODE REM PT RTRN 9FT ADLT (ELECTROSURGICAL) ×1 IMPLANT
GLOVE BIO SURGEON STRL SZ7 (GLOVE) ×2 IMPLANT
GLOVE BIOGEL PI IND STRL 7.0 (GLOVE) IMPLANT
GLOVE BIOGEL PI IND STRL 9 (GLOVE) ×1 IMPLANT
GLOVE BIOGEL PI INDICATOR 7.0 (GLOVE) ×2
GLOVE BIOGEL PI INDICATOR 9 (GLOVE) ×2
GLOVE SURG ORTHO 9.0 STRL STRW (GLOVE) ×3 IMPLANT
GOWN STRL REUS W/ TWL XL LVL3 (GOWN DISPOSABLE) ×2 IMPLANT
GOWN STRL REUS W/TWL XL LVL3 (GOWN DISPOSABLE) ×4
KIT BASIN OR (CUSTOM PROCEDURE TRAY) ×3 IMPLANT
KIT DRSG PREVENA PLUS 7DAY 125 (MISCELLANEOUS) ×2 IMPLANT
KIT TURNOVER KIT B (KITS) ×3 IMPLANT
NS IRRIG 1000ML POUR BTL (IV SOLUTION) ×3 IMPLANT
PACK ORTHO EXTREMITY (CUSTOM PROCEDURE TRAY) ×3 IMPLANT
PAD ARMBOARD 7.5X6 YLW CONV (MISCELLANEOUS) ×6 IMPLANT
SUT ETHILON 2 0 PSLX (SUTURE) ×5 IMPLANT
TOWEL GREEN STERILE (TOWEL DISPOSABLE) ×3 IMPLANT
TUBE CONNECTING 12'X1/4 (SUCTIONS) ×1
TUBE CONNECTING 12X1/4 (SUCTIONS) ×2 IMPLANT
YANKAUER SUCT BULB TIP NO VENT (SUCTIONS) ×3 IMPLANT

## 2019-03-27 NOTE — Interval H&P Note (Signed)
History and Physical Interval Note:  03/27/2019 7:46 AM  [name]  has presented today for surgery, with the diagnosis of Gangrene left foot.  The various methods of treatment have been discussed with the patient and family. After consideration of risks, benefits and other options for treatment, the patient has consented to  Procedure(s): TRANSMETATARSAL AMPUTATION left foot (Left) as a surgical intervention.  The patient's history has been reviewed, patient examined, no change in status, stable for surgery.  I have reviewed the patient's chart and labs.  Questions were answered to the patient's satisfaction.     [name]

## 2019-03-27 NOTE — Transfer of Care (Signed)
Immediate Anesthesia Transfer of Care Note  Patient: [place]  Procedure(s) Performed: TRANSMETATARSAL AMPUTATION left foot (Left Foot)  Patient Location: PACU  Anesthesia Type:MAC combined with regional for post-op pain  Level of Consciousness: awake, alert  and patient cooperative  Airway & Oxygen Therapy: Patient Spontanous Breathing and Patient connected to nasal cannula oxygen  Post-op Assessment: Report given to RN and Post -op Vital signs reviewed and stable  Post vital signs: Reviewed and stable  Last Vitals:  Vitals Value Taken Time  BP 93/59 03/27/19 0840  Temp    Pulse 77 03/27/19 0843  Resp 22 03/27/19 0843  SpO2 100 % 03/27/19 0843  Vitals shown include unvalidated device data.  Last Pain:  Vitals:   03/27/19 0840  TempSrc:   PainSc: (P) 0-No pain      Patients Stated Pain Goal: 0 (45/36/46 8032)  Complications: No apparent anesthesia complications

## 2019-03-27 NOTE — Op Note (Addendum)
03/27/2019  8:42 AM  PATIENT:  [name]    PRE-OPERATIVE DIAGNOSIS:  Gangrene left foot with osteomyelitis  POST-OPERATIVE DIAGNOSIS:  Same  PROCEDURE:  TRANSMETATARSAL AMPUTATION left foot Application of Prevena wound VAC customizable Local tissue rearrangement for wound closure 15 x 5 cm.  SURGEON:  [name], MD  PHYSICIAN ASSISTANT:None ANESTHESIA:   General  PREOPERATIVE INDICATIONS:  [name] is a  64 y.o. male with a diagnosis of Gangrene left foot who failed conservative measures and elected for surgical management.    The risks benefits and alternatives were discussed with the patient preoperatively including but not limited to the risks of infection, bleeding, nerve injury, cardiopulmonary complications, the need for revision surgery, among others, and the patient was willing to proceed.  OPERATIVE IMPLANTS: Praveena wound VAC  _0 @  OPERATIVE FINDINGS: Calcified vessels with minimal bleeding.    The chronic ulcerative tissue was sent for cultures.  OPERATIVE PROCEDURE: Patient was brought the operating room after undergoing an ankle block.  Patient underwent a MAC anesthetic.  After adequate levels anesthesia were obtained patient's left lower extremity was prepped using DuraPrep draped into a sterile field a timeout was called.  A fishmouth incision was made just proximal to the gangrenous necrotic ulcers.  A transmetatarsal amputation was performed with a oscillating saw.  Patient had a chronic ulcer that was proximal to the amputation incision in this area of ulcerative tissue was excised in a V pattern.  This left a wound that was 15 x 5 cm.  The wound was irrigated with normal saline electrocautery was used for hemostasis.  Local tissue rearrangement was used to close the wound that was 15 x 5 cm after excising the chronic plantar ulcer.  The chronic plantar ulcer was sent for cultures.  2-0 nylon was used for wound closure.  The foot was covered  in a Praveena customizable dressing this had a good suction fit patient was taken to the PACU in stable condition.   DISCHARGE PLANNING:  Antibiotic duration: Antibiotics per culture sensitivities  Weightbearing: Nonweightbearing on the left  Pain medication: Opioid pathway  Dressing care/ Wound VAC: Discharged with the customizable wound VAC  Ambulatory devices: Walker or crutches  Discharge to: Anticipate discharge to home.  Follow-up: In the office 1 week post operative.

## 2019-03-27 NOTE — Anesthesia Procedure Notes (Signed)
Anesthesia Regional Block: Ankle block   Pre-Anesthetic Checklist: ,, timeout performed, Correct Patient, Correct Site, Correct Laterality, Correct Procedure, Correct Position, site marked, surgical consent, at surgeon's request  Laterality: Left  Prep: chloraprep        Narrative:  Start time: 03/27/2019 7:10 AM End time: 03/27/2019 7:15 AM Injection made incrementally with aspirations every 5 mL.  Performed by: Personally   Additional Notes: 35 cc 2% lidocaine injected easily 22 G needle.

## 2019-03-27 NOTE — Anesthesia Procedure Notes (Signed)
Procedure Name: MAC Date/Time: 03/27/2019 7:44 AM Performed by: Renato Shin, CRNA Pre-anesthesia Checklist: Patient identified, Emergency Drugs available, Suction available and Patient being monitored Patient Re-evaluated:Patient Re-evaluated prior to induction Oxygen Delivery Method: Simple face mask Preoxygenation: Pre-oxygenation with 100% oxygen Induction Type: IV induction Placement Confirmation: positive ETCO2 and breath sounds checked- equal and bilateral Dental Injury: Teeth and Oropharynx as per pre-operative assessment

## 2019-03-27 NOTE — Anesthesia Postprocedure Evaluation (Signed)
Anesthesia Post Note  Patient: [place]  Procedure(s) Performed: TRANSMETATARSAL AMPUTATION left foot (Left Foot)     Patient location during evaluation: PACU Anesthesia Type: Regional Level of consciousness: awake and alert Pain management: pain level controlled Vital Signs Assessment: post-procedure vital signs reviewed and stable Respiratory status: spontaneous breathing, nonlabored ventilation, respiratory function stable and patient connected to nasal cannula oxygen Cardiovascular status: stable and blood pressure returned to baseline Postop Assessment: no apparent nausea or vomiting Anesthetic complications: no    Last Vitals:  Vitals:   03/27/19 0855 03/27/19 0919  BP:  113/72  Pulse:  63  Resp:  19  Temp: 36.9 C 36.7 C  SpO2:  100%    Last Pain:  Vitals:   03/27/19 0919  TempSrc: Oral  PainSc:                  Robecca Fulgham COKER

## 2019-03-27 NOTE — Progress Notes (Signed)
Pharmacy Antibiotic Note  [name] is a 64 y.o. male admitted on 03/25/2019 with hx osteomyelitis with recurrent infection. Pharmacy consulted for vanc / zosyn dosing to start after surgery. Patient is now s/p surgery. Patient received one dose of cefazolin in the OR. Pre-antibiotic tissue and bone cultures were drawn in surgery, results are currently pending. Will begin broad spectrum as previous indicated.    Plan: Start Vancomycin 2059m IV x1 then 12565mIV q12h - est AUC 466 Start Zosyn 3.375g IV EI q8h Follow-up clinical improvement, antibiotic de-escalation based on culture results, LOT and renal function  Height: 6' 2"  (188 cm) Weight: 238 lb (108 kg) IBW/kg (Calculated) : 82.2  Temp (24hrs), Avg:98.4 F (36.9 C), Min:98 F (36.7 C), Max:98.6 F (37 C)  Recent Labs  Lab 03/25/19 1021 03/25/19 1223 03/26/19 0633 03/27/19 0427  WBC 7.6  --  5.3 4.9  CREATININE 1.30*  --  1.16 1.24  LATICACIDVEN 1.4 0.9  --   --     Estimated Creatinine Clearance: 81.8 mL/min (by C-G formula based on SCr of 1.24 mg/dL).    Allergies  Allergen Reactions  . Other Hives    Anti-snake venim for copperhead venom.  . Pravastatin Other (See Comments)    Leg cramps, elevated CK  . Prednisone Other (See Comments)    Patient states "it runs my blood sugar up so I don't take it."  . Toradol [Ketorolac Tromethamine] Hives and Nausea And Vomiting    Pill form only    Antimicrobials this admission: Zosyn 3/13 >>  Vancomycin 3/13 >>    Microbiology results: 3/11 BCx: NGTD  Thank you for the interesting consult and for involving pharmacy in this patient's care.  JoTamela GammonPharmD, BCPS 03/27/2019 9:58 AM PGY-2 Pharmacy Administration Resident Please check [email or web address] for unit-specific pharmacist phone numbers

## 2019-03-27 NOTE — Anesthesia Preprocedure Evaluation (Signed)
Anesthesia Evaluation  Patient identified by MRN, date of birth, ID band Patient awake    Reviewed: Allergy & Precautions, NPO status , Patient's Chart, lab work & pertinent test results  Airway Mallampati: III  TM Distance: >3 FB Neck ROM: Full    Dental  (+) Teeth Intact, Dental Advisory Given   Pulmonary    breath sounds clear to auscultation       Cardiovascular hypertension,  Rhythm:Regular Rate:Normal     Neuro/Psych    GI/Hepatic   Endo/Other  diabetes  Renal/GU      Musculoskeletal   Abdominal (+) + obese,   Peds  Hematology   Anesthesia Other Findings   Reproductive/Obstetrics                             Anesthesia Physical Anesthesia Plan  ASA: III  Anesthesia Plan: MAC and Regional   Post-op Pain Management:    Induction: Intravenous  PONV Risk Score and Plan: Ondansetron  Airway Management Planned: Natural Airway and Simple Face Mask  Additional Equipment:   Intra-op Plan:   Post-operative Plan:   Informed Consent: I have reviewed the patients History and Physical, chart, labs and discussed the procedure including the risks, benefits and alternatives for the proposed anesthesia with the patient or authorized representative who has indicated his/her understanding and acceptance.       Plan Discussed with: CRNA and Anesthesiologist  Anesthesia Plan Comments: (Plan ankle block with MAC)        Anesthesia Quick Evaluation

## 2019-03-27 NOTE — Progress Notes (Signed)
Progress Note    [name]  RPR:[id number] DOB: 1955/10/12  DOA: 03/25/2019 PCP: System, Pcp Not In    Brief Narrative:     Medical records reviewed and are as summarized below:  [name] is an 64 y.o. male with hx DM, HTN, chronic pain on daily opioids and hx osteomyelitis of the R humerus at [facility] in 2015 and osteomyelitis of the left foot in 2020 who presents with slowly progressive redness and swelling of the left foot.  The patient is only able to give vague details about his left foot infection, but able to articulate that he has had this infection for "about 2 years", it has been debrided at least once, and he has had a PICC line for extended IV antibiotics at least once.  Since last on IV antibiotics, which he estimates about 9 months ago, his foot has been well until the beginning of this year when it became red and discolored again.  At that time he was referred to the hyperbaric clinic in [place] where he started to go regularly.  Recent cultures (presumably surface swabs?) from their clinic showed polymicrobial infection.  Started on cephalexin without improvement.   Assessment/Plan:   Principal Problem:   Diabetic foot ulcer ([place]) Active Problems:   DM (diabetes mellitus), type 2, uncontrolled ([place])   Essential hypertension, benign   Osteomyelitis of left foot (HCC)   Idiopathic chronic gout of left foot without tophus   Osteomyelitis of the left foot - [facility]: noted that the patient had had an operative debridement for osteomyelitis in Dec 2020 -ID consult appreciated: They recommend ABI and consult with orthopedics for possible TMA -Zosyn and vancomycin was recommended by ID yesterday but it appears they want for after surgery -culture pending -Status post surgery: Plan is for antibiotics per culture sensitivities, nonweightbearing on the left, wound VAC, follow-up with [name] in 1 week  Renal  insufficiency -Improved Monitor daily  Normocytic anemia Likely due to chronic infection.  No clinical blood loss.  Diabetes type 2 with polyneuropathy -Continue home Levemir, reduced dose to 40 daily -Sliding scale correction insulin ordered -Needs strict control to help with healing -Continue baby aspirin -Continue amitriptyline and gabapentin  Hypertension -Continue  Lasix, lisinopril, metoprolol -Blood pressure on the lower end of normal  Gout -Continue allopurinol  obesity Body mass index is 30.56 kg/m.   Family Communication/Anticipated D/C date and plan/Code Status   DVT prophylaxis: Lovenox ordered. Code Status: Full Code.  Family Communication: Patient declined phone call to family Disposition Plan: s/p sugery   Medical Consultants:    ID  [name]     Subjective:   Just back from surgery, working on pain control  Objective:    Vitals:   03/27/19 1019 03/27/19 1115 03/27/19 1215 03/27/19 1306  BP: 119/68 115/70 100/62 102/71  Pulse:  72 71 75  Resp: 19 18 17 18   Temp:      TempSrc:      SpO2: 100% 98% 99%   Weight:      Height:        Intake/Output Summary (Last 24 hours) at 03/27/2019 1411 Last data filed at 03/27/2019 1309 Gross per 24 hour  Intake 1977.84 ml  Output 915 ml  Net 1062.84 ml   Filed Weights   03/26/19 0800  Weight: 108 kg    Exam: Resting, no acute distress  Data Reviewed:   I have personally reviewed following labs and imaging studies:  Labs:  Labs show the following:   Basic Metabolic Panel: Recent Labs  Lab 03/25/19 1021 03/25/19 1021 03/26/19 0633 03/27/19 0427  NA 135  --  139 138  K 5.4*   < > 4.3 4.7  CL 102  --  102 103  CO2 21*  --  24 25  GLUCOSE 180*  --  198* 167*  BUN 29*  --  21 20  CREATININE 1.30*  --  1.16 1.24  CALCIUM 10.2  --  9.7 9.6   < > = values in this interval not displayed.   GFR Estimated Creatinine Clearance: 81.8 mL/min (by C-G formula based on SCr of 1.24  mg/dL). Liver Function Tests: Recent Labs  Lab 03/25/19 1021  AST 15  ALT 11  ALKPHOS 70  BILITOT 0.6  PROT 8.0  ALBUMIN 3.7   No results for input(s): LIPASE, AMYLASE in the last 168 hours. No results for input(s): AMMONIA in the last 168 hours. Coagulation profile Recent Labs  Lab 03/25/19 1021  INR 1.1    CBC: Recent Labs  Lab 03/25/19 1021 03/26/19 0633 03/27/19 0427  WBC 7.6 5.3 4.9  NEUTROABS 5.4  --   --   HGB 10.2* 9.4* 9.4*  HCT 29.8* 27.5* 27.8*  MCV 92.0 91.4 91.4  PLT 188 165 143*   Cardiac Enzymes: No results for input(s): CKTOTAL, CKMB, CKMBINDEX, TROPONINI in the last 168 hours. BNP (last 3 results) No results for input(s): PROBNP in the last 8760 hours. CBG: Recent Labs  Lab 03/26/19 2135 03/27/19 0625 03/27/19 0703 03/27/19 0839 03/27/19 1114  GLUCAP 132* 142* 152* 149* 169*   D-Dimer: No results for input(s): DDIMER in the last 72 hours. Hgb A1c: Recent Labs    03/25/19 1021  HGBA1C 7.2*   Lipid Profile: No results for input(s): CHOL, HDL, LDLCALC, TRIG, CHOLHDL, LDLDIRECT in the last 72 hours. Thyroid function studies: No results for input(s): TSH, T4TOTAL, T3FREE, THYROIDAB in the last 72 hours.  Invalid input(s): FREET3 Anemia work up: No results for input(s): VITAMINB12, FOLATE, FERRITIN, TIBC, IRON, RETICCTPCT in the last 72 hours. Sepsis Labs: Recent Labs  Lab 03/25/19 1021 03/25/19 1223 03/26/19 0633 03/27/19 0427  WBC 7.6  --  5.3 4.9  LATICACIDVEN 1.4 0.9  --   --     Microbiology Recent Results (from the past 240 hour(s))  Aerobic Culture (superficial specimen)     Status: None   Collection Time: 03/19/19  1:20 PM   Specimen: Wound  Result Value Ref Range Status   Specimen Description   Final    WOUND LEFT 2ND TOE Performed at [facility], [place] [address] [place], [place] [address]    Special Requests   Final    NONE Performed at [facility], [place] [address] [place], [place] [address]    Gram Stain   Final    NO WBC SEEN NO ORGANISMS SEEN Performed at [facility], [place] [address] [place], [place] [address]    Culture   Final    RARE ENTEROBACTER AEROGENES RARE CANDIDA PARAPSILOSIS RARE ALCALIGENES FAECALIS    Report Status 03/26/2019 FINAL  Final   Organism ID, Bacteria ENTEROBACTER AEROGENES  Final   Organism ID, Bacteria ALCALIGENES FAECALIS  Final      Susceptibility   Alcaligenes faecalis - MIC*    CEFAZOLIN 32 INTERMEDIATE Intermediate     GENTAMICIN 2 SENSITIVE Sensitive     CIPROFLOXACIN 1 SENSITIVE Sensitive     IMIPENEM  0.5 SENSITIVE Sensitive     TRIMETH/SULFA >=320 RESISTANT Resistant     * RARE ALCALIGENES FAECALIS   Enterobacter aerogenes - MIC*    CEFAZOLIN >=64 RESISTANT Resistant     CEFEPIME >=32 RESISTANT Resistant     CEFTAZIDIME <=1 SENSITIVE Sensitive     CEFTRIAXONE >=64 RESISTANT Resistant     CIPROFLOXACIN 0.5 SENSITIVE Sensitive     GENTAMICIN <=1 SENSITIVE Sensitive     IMIPENEM 2 SENSITIVE Sensitive     TRIMETH/SULFA <=20 SENSITIVE Sensitive     PIP/TAZO 8 SENSITIVE Sensitive     * RARE ENTEROBACTER AEROGENES  Culture, blood (Routine x 2)     Status: None (Preliminary result)   Collection Time: 03/25/19 10:20 AM   Specimen: BLOOD RIGHT HAND  Result Value Ref Range Status   Specimen Description BLOOD RIGHT HAND  Final   Special Requests   Final    BOTTLES DRAWN AEROBIC AND ANAEROBIC Blood Culture adequate volume   Culture   Final    NO GROWTH 2 DAYS Performed at [facility], [address] [place], [place] [address]    Report Status PENDING  Incomplete  SARS CORONAVIRUS 2 (TAT 6-24 HRS) Nasopharyngeal Nasopharyngeal Swab     Status: None   Collection Time: 03/25/19 12:51 PM   Specimen: Nasopharyngeal Swab  Result Value Ref Range Status   SARS Coronavirus 2 NEGATIVE NEGATIVE Final    Comment: (NOTE) SARS-CoV-2 target nucleic acids are NOT DETECTED. The SARS-CoV-2 RNA is  generally detectable in upper and lower respiratory specimens during the acute phase of infection. Negative results do not preclude SARS-CoV-2 infection, do not rule out co-infections with other pathogens, and should not be used as the sole basis for treatment or other patient management decisions. Negative results must be combined with clinical observations, patient history, and epidemiological information. The expected result is Negative. Fact Sheet for Patients: SugarRoll.be Fact Sheet for Healthcare Providers: https://www.woods-mathews.com/ This test is not yet approved or cleared by the [place] FDA and  has been authorized for detection and/or diagnosis of SARS-CoV-2 by FDA under an Emergency Use Authorization (EUA). This EUA will remain  in effect (meaning this test can be used) for the duration of the COVID-19 declaration under Section 56 4(b)(1) of the Act, 21 U.S.C. section 360bbb-3(b)(1), unless the authorization is terminated or revoked sooner. Performed at [facility], [place] [address], [place], [place] [address]   Culture, blood (Routine x 2)     Status: None (Preliminary result)   Collection Time: 03/25/19  1:01 PM   Specimen: BLOOD  Result Value Ref Range Status   Specimen Description BLOOD LEFT ANTECUBITAL  Final   Special Requests   Final    BOTTLES DRAWN AEROBIC AND ANAEROBIC Blood Culture results may not be optimal due to an excessive volume of blood received in culture bottles   Culture   Final    NO GROWTH 2 DAYS Performed at [facility], [place] [address], [place], [place] [address]    Report Status PENDING  Incomplete  MRSA PCR Screening     Status: None   Collection Time: 03/25/19  7:54 PM   Specimen: Nasal Mucosa; Nasopharyngeal  Result Value Ref Range Status   MRSA by PCR NEGATIVE NEGATIVE Final    Comment:        The GeneXpert MRSA Assay (FDA approved for NASAL specimens only), is one component  of a comprehensive MRSA colonization surveillance program. It is not intended to  diagnose MRSA infection nor to guide or monitor treatment for MRSA infections. Performed at [facility], [place] [address], [place],  [address]     Procedures and diagnostic studies:  VAS [place] ABI WITH/WO TBI  Result Date: 03/26/2019 LOWER EXTREMITY DOPPLER STUDY Indications: Gangrene.  Limitations: Today's exam was limited due to large toe circumference. Comparison Study: No prior study Performing Technologist: Maudry Mayhew MHA, RVT, RDCS, RDMS  Examination Guidelines: A complete evaluation includes at minimum, Doppler waveform signals and systolic blood pressure reading at the level of bilateral brachial, anterior tibial, and posterior tibial arteries, when vessel segments are accessible. Bilateral testing is considered an integral part of a complete examination. Photoelectric Plethysmograph (PPG) waveforms and toe systolic pressure readings are included as required and additional duplex testing as needed. Limited examinations for reoccurring indications may be performed as noted.  ABI Findings: +---------+------------------+-----+---------+--------+ Right    Rt Pressure (mmHg)IndexWaveform Comment  +---------+------------------+-----+---------+--------+ Brachial 125                    triphasic         +---------+------------------+-----+---------+--------+ PTA      204               1.63 triphasic         +---------+------------------+-----+---------+--------+ DP       158               1.26 triphasic         +---------+------------------+-----+---------+--------+ Great Toe                       Present           +---------+------------------+-----+---------+--------+ +---------+------------------+-----+----------+-------+ Left     Lt Pressure (mmHg)IndexWaveform  Comment +---------+------------------+-----+----------+-------+ Brachial 123                     triphasic         +---------+------------------+-----+----------+-------+ PTA      145               1.16 monophasic        +---------+------------------+-----+----------+-------+ DP       182               1.46 triphasic         +---------+------------------+-----+----------+-------+ Great Toe                       Present           +---------+------------------+-----+----------+-------+ +-------+-----------+-----------+------------+------------+ ABI/TBIToday's ABIToday's TBIPrevious ABIPrevious TBI +-------+-----------+-----------+------------+------------+ Right  1.63                                           +-------+-----------+-----------+------------+------------+ Left   1.46                                           +-------+-----------+-----------+------------+------------+ Unable to obtain toe pressures due to great toe being too large for cuff.  Summary: Right: Resting right ankle-brachial index is elevated suggestive of medial arterial calcification. Left: Resting left ankle-brachial index is elevated suggestive of medial arterial calcification.  *See table(s) above for measurements and observations.    Preliminary     Medications:   . allopurinol  300  mg Oral Daily  . amitriptyline  25 mg Oral QHS  . aspirin EC  81 mg Oral Daily  . docusate sodium  100 mg Oral BID  . enoxaparin (LOVENOX) injection  40 mg Subcutaneous Q24H  . gabapentin  300 mg Oral TID  . insulin aspart  0-15 Units Subcutaneous TID WC  . insulin aspart  0-5 Units Subcutaneous QHS  . insulin detemir  40 Units Subcutaneous QHS  . lisinopril  40 mg Oral Daily  . metoprolol succinate  100 mg Oral Daily  . mupirocin ointment  1 application Nasal BID   Continuous Infusions: . sodium chloride 10 mL/hr at 03/27/19 0934  . methocarbamol (ROBAXIN) IV    . piperacillin-tazobactam (ZOSYN)  IV 3.375 g (03/27/19 1354)  . vancomycin       LOS: 1 day   Geradine Girt  [facility]   How to contact the [facility] Attending or Consulting provider [place] or covering provider during after hours [place], for this patient?  1. Check the care team in [facility] and look for a) attending/consulting TRH provider listed and b) the [facility] team listed 2. Log into [email or web address] and use [place]'s universal password to access. If you do not have the password, please contact the hospital operator. 3. Locate the [facility] provider you are looking for under [facility] and page to a number that you can be directly reached. 4. If you still have difficulty reaching the provider, please page the [facility] (Director on Call) for the Hospitalists listed on amion for assistance.  03/27/2019, 2:11 PM

## 2019-03-28 LAB — GLUCOSE, CAPILLARY
Glucose-Capillary: 182 mg/dL — ABNORMAL HIGH (ref 70–99)
Glucose-Capillary: 191 mg/dL — ABNORMAL HIGH (ref 70–99)
Glucose-Capillary: 148 mg/dL — ABNORMAL HIGH (ref 70–99)
Glucose-Capillary: 160 mg/dL — ABNORMAL HIGH (ref 70–99)

## 2019-03-28 MED ORDER — IBUPROFEN 400 MG PO TABS
600.0000 mg | ORAL_TABLET | Freq: Once | ORAL | Status: AC
  Administered 2019-03-28: 23:00:00 600 mg via ORAL
  Filled 2019-03-28: qty 1

## 2019-03-28 MED ORDER — IBUPROFEN 400 MG PO TABS
600.0000 mg | ORAL_TABLET | Freq: Once | ORAL | Status: AC
  Administered 2019-03-28: 12:00:00 600 mg via ORAL
  Filled 2019-03-28: qty 1

## 2019-03-28 MED ORDER — COLCHICINE 0.6 MG PO TABS
1.2000 mg | ORAL_TABLET | Freq: Once | ORAL | Status: AC
  Administered 2019-03-28: 12:00:00 1.2 mg via ORAL
  Filled 2019-03-28: qty 2

## 2019-03-28 MED ORDER — COLCHICINE 0.6 MG PO TABS
0.6000 mg | ORAL_TABLET | Freq: Every day | ORAL | Status: DC
  Administered 2019-03-29: 08:00:00 0.6 mg via ORAL
  Filled 2019-03-28: qty 1

## 2019-03-28 MED ORDER — VANCOMYCIN HCL 1750 MG/350ML IV SOLN
1750.0000 mg | INTRAVENOUS | Status: DC
  Administered 2019-03-29: 10:00:00 1750 mg via INTRAVENOUS
  Filled 2019-03-28: qty 350

## 2019-03-28 NOTE — Evaluation (Signed)
Physical Therapy Evaluation Patient Details Name: [name] MRN: [id number] DOB: [birth year] Today's Date: 03/28/2019   History of Present Illness  [name] is an 64 y.o. male with hx DM, HTN, chronic pain on daily opioids and hx osteomyelitis of the R humerus at [facility] in 2015 and osteomyelitis of the left foot in 2020 who presents with slowly progressive redness and swelling of the left foot. Now s/p L transmetatarsal amputation; as of 3/14, mobility is compromised by R knee gout flare  Clinical Impression   Patient is s/p above surgery resulting in functional limitations due to the deficits listed below (see PT Problem List). Independent prior to admission, walks with a walking stick for community amb; Presents to PT with significantly decr functional mobility due to NWB status LLE, coupled with severe gout flare R knee; Able to perform modified lateral scoot transfer, depending mostly on UEs for transfer; If gout flare takes a while to resolve, we must consider a wheelchair for mobility;  Patient will benefit from skilled PT to increase their independence and safety with mobility to allow discharge to the venue listed below.       Follow Up Recommendations Home health PT;Other (comment)(Insurance may not support HHPT) But noted he is active with [facility] Recommendations  Rolling walker with 5" wheels;Wheelchair (measurements PT);Wheelchair cushion (measurements PT);Other (comment)(Drop-arm BSC)    Recommendations for Other Services OT consult(will order per protocol)     Precautions / Restrictions Precautions Precautions: Fall Restrictions LLE Weight Bearing: Non weight bearing      Mobility  Bed Mobility Overal bed mobility: Needs Assistance Bed Mobility: Supine to Sit     Supine to sit: Supervision     General bed mobility comments: Supervision mostly for VAC line; Good progression to sitting up with use of bedrails  Transfers Overall  transfer level: Needs assistance   Transfers: Lateral/Scoot Transfers          Lateral/Scoot Transfers: Min guard General transfer comment: Slightly modified lateral scoot transfer OOB to recliner on pt's R side; Unable to remove armrest, so recliner positioned adjacent and almost perpendicular to bed, with a small triangular space, enough for RLE to touch down between bed and recliner; Dependent on UEs to scoot and heavy use of armrests to complete transfer  Ambulation/Gait                Stairs            Wheelchair Mobility    Modified Rankin (Stroke Patients Only)       Balance                                             Pertinent Vitals/Pain Pain Assessment: 0-10 Pain Score: 8  Pain Location: L foot and R knee Pain Descriptors / Indicators: Constant;Grimacing;Guarding Pain Intervention(s): Monitored during session;Premedicated before session;Repositioned    Home Living Family/patient expects to be discharged to:: Private residence Living Arrangements: Alone Available Help at Discharge: Family;Available PRN/intermittently(Kids vist some on weekends; Tells me he has "someone" who can help him with grocery shopping) Type of Home: House Home Access: Stairs to enter   [employer] of Steps: 2(1 at sidewalk, 1 to get on porch) Home Layout: One level Home Equipment: (walking stick) Additional Comments: Will need more info re: bathroom setup    Prior Function Level of Independence: Independent  Comments: Uses walking stick out in community     Hand Dominance        Extremity/Trunk Assessment   Upper Extremity Assessment Upper Extremity Assessment: Overall WFL for tasks assessed    Lower Extremity Assessment Lower Extremity Assessment: RLE deficits/detail;LLE deficits/detail RLE Deficits / Details: R knee with gout flare, swollen and warm compared to L knee; Flexion and extension limited by pain, but able to  get to approx 80-90 deg flexion during transfer RLE: Unable to fully assess due to pain LLE Deficits / Details: Hip and knee WFL; Painful L foot and ankle, effecting ROM; wound VAC dressing applied LLE: Unable to fully assess due to pain       Communication   Communication: No difficulties  Cognition Arousal/Alertness: Awake/alert Behavior During Therapy: WFL for tasks assessed/performed Overall Cognitive Status: Within Functional Limits for tasks assessed                                        General Comments General comments (skin integrity, edema, etc.): Assisted pt in donning shorts    Exercises     Assessment/Plan    PT Assessment Patient needs continued PT services  PT Problem List Decreased range of motion;Decreased activity tolerance;Decreased balance       PT Treatment Interventions DME instruction;Gait training;Stair training;Functional mobility training;Therapeutic activities;Therapeutic exercise;Balance training;Patient/family education;Wheelchair mobility training    PT Goals (Current goals can be found in the Care Plan section)  Acute Rehab PT Goals Patient Stated Goal: wants to get home PT Goal Formulation: With patient Time For Goal Achievement: 04/11/19 Potential to Achieve Goals: Good    Frequency Min 4X/week   Barriers to discharge Decreased caregiver support Will need to be completely modified independent to be able to dc home    Co-evaluation               AM-PAC PT "6 Clicks" Mobility  Outcome Measure Help needed turning from your back to your side while in a flat bed without using bedrails?: None Help needed moving from lying on your back to sitting on the side of a flat bed without using bedrails?: None Help needed moving to and from a bed to a chair (including a wheelchair)?: A Little Help needed standing up from a chair using your arms (e.g., wheelchair or bedside chair)?: Total Help needed to walk in hospital room?:  Total Help needed climbing 3-5 steps with a railing? : Total 6 Click Score: 14    End of Session   Activity Tolerance: Patient tolerated treatment well Patient left: in chair;with call bell/phone within reach;with chair alarm set Nurse Communication: Mobility status PT Visit Diagnosis: Other abnormalities of gait and mobility (R26.89);Pain Pain - Right/Left: (Bilateral) Pain - part of body: (L foot and R knee)    Time: 0071-2197 PT Time Calculation (min) (ACUTE ONLY): 28 min   Charges:   PT Evaluation $PT Eval Moderate Complexity: 1 Mod PT Treatments $Therapeutic Activity: 8-22 mins        Roney Marion, PT  Acute Rehabilitation Services Pager [phone] Office [place] 03/28/2019, 4:11 PM

## 2019-03-28 NOTE — Progress Notes (Signed)
PHARMACY NOTE:  ANTIMICROBIAL RENAL DOSAGE ADJUSTMENT  Current antimicrobial regimen includes a mismatch between antimicrobial dosage and estimated renal function.  As per policy approved by the Pharmacy & Therapeutics and Medical Executive Committees, the antimicrobial dosage will be adjusted accordingly.  Current antimicrobial dosage:  Vancomycin 1250 mg IV q12hr   Indication: Hx osteomyelitis with recurrent infection  Renal Function: Estimated Creatinine Clearance: 81.8 mL/min (by C-G formula based on SCr of 1.24 mg/dL).     Antimicrobial dosage has been changed to:   Vancomycin 1759m IV q24h  Expected AUC: 500 SCr used: 1.24  Additional comments:  Thank you for allowing pharmacy to be a part of this patient's care.  MAcey Lav PharmD  PGY1 Acute Care Pharmacy Resident 03/28/2019 12:03 PM

## 2019-03-28 NOTE — Care Management (Signed)
Per handoff, patient is active w [facility] for [facility] services. Notified liaison patient is admitted. [facility] confirms that he is active for RN. Will continue to follow for post op recs for [facility] DME needs.

## 2019-03-28 NOTE — Progress Notes (Addendum)
Progress Note    [name]  KNL:[id number] DOB: 1955-12-16  DOA: 03/25/2019 PCP: System, Pcp Not In    Brief Narrative:     Medical records reviewed and are as summarized below:  [name] is an 64 y.o. male with hx DM, HTN, chronic pain on daily opioids and hx osteomyelitis of the R humerus at [facility] in 2015 and osteomyelitis of the left foot in 2020 who presents with slowly progressive redness and swelling of the left foot.  The patient is only able to give vague details about his left foot infection, but able to articulate that he has had this infection for "about 2 years", it has been debrided at least once, and he has had a PICC line for extended IV antibiotics at least once.  Since last on IV antibiotics, which he estimates about 9 months ago, his foot has been well until the beginning of this year when it became red and discolored again.  At that time he was referred to the hyperbaric clinic in [place] where he started to go regularly.  Recent cultures (presumably surface swabs?) from their clinic showed polymicrobial infection.  Started on cephalexin without improvement.   Assessment/Plan:   Principal Problem:   Diabetic foot ulcer ([place]) Active Problems:   DM (diabetes mellitus), type 2, uncontrolled ([place])   Essential hypertension, benign   Osteomyelitis of left foot (HCC)   Idiopathic chronic gout of left foot without tophus   Osteomyelitis of the left foot - [facility]: noted that the patient had had an operative debridement for osteomyelitis in Dec 2020 -ID consult appreciated: They recommend ABI and consult with orthopedics for possible TMA -Zosyn and vancomycin was recommended by ID yesterday but it appears they want for after surgery -culture pending -Status post surgery: Plan is for antibiotics per culture sensitivities, nonweightbearing on the left, wound VAC, follow-up with Dr. Sharol [name] in 1 week  Renal  insufficiency -Improved Monitor daily  Normocytic anemia Likely due to chronic infection.  No clinical blood loss.  Diabetes type 2 with polyneuropathy -Continue home Levemir, reduced dose to 40 daily -Sliding scale correction insulin ordered -Needs strict control to help with healing -Continue baby aspirin -Continue amitriptyline and gabapentin  Hypertension -Continue  Lasix, lisinopril, metoprolol -Blood pressure on the lower end of normal  Gout -right knee acute gout -ibuprofen x 1 -start colchicine   obesity Body mass index is 30.56 kg/m.   Family Communication/Anticipated D/C date and plan/Code Status   DVT prophylaxis: Lovenox ordered. Code Status: Full Code.  Family Communication: Patient declined phone call to family Disposition Plan: s/p surgery on 3/13, await PT evaluation for safe discharge home as well as home health arrangements for wound VAC   Medical Consultants:    ID  Dr. Sharol [name]     Subjective:   Sleeping soundly this morning, no overnight events  Objective:    Vitals:   03/27/19 1729 03/28/19 0022 03/28/19 0551 03/28/19 0853  BP: 111/72 111/76 101/68 117/71  Pulse: 87 94 88 90  Resp: 18 18 17    Temp: 99.1 F (37.3 C) 99.6 F (37.6 C) 98.2 F (36.8 C)   TempSrc: Oral     SpO2: 100% 97% 96%   Weight:      Height:        Intake/Output Summary (Last 24 hours) at 03/28/2019 1042 Last data filed at 03/28/2019 0858 Gross per 24 hour  Intake 2207.93 ml  Output 3425 ml  Net -1217.07 ml  Filed Weights   03/26/19 0800  Weight: 108 kg    Exam: In bed, sleeping soundly No increased work of breathing  Data Reviewed:   I have personally reviewed following labs and imaging studies:  Labs: Labs show the following:   Basic Metabolic Panel: Recent Labs  Lab 03/25/19 1021 03/25/19 1021 03/26/19 0633 03/27/19 0427  NA 135  --  139 138  K 5.4*   < > 4.3 4.7  CL 102  --  102 103  CO2 21*  --  24 25  GLUCOSE 180*  --   198* 167*  BUN 29*  --  21 20  CREATININE 1.30*  --  1.16 1.24  CALCIUM 10.2  --  9.7 9.6   < > = values in this interval not displayed.   GFR Estimated Creatinine Clearance: 81.8 mL/min (by C-G formula based on SCr of 1.24 mg/dL). Liver Function Tests: Recent Labs  Lab 03/25/19 1021  AST 15  ALT 11  ALKPHOS 70  BILITOT 0.6  PROT 8.0  ALBUMIN 3.7   No results for input(s): LIPASE, AMYLASE in the last 168 hours. No results for input(s): AMMONIA in the last 168 hours. Coagulation profile Recent Labs  Lab 03/25/19 1021  INR 1.1    CBC: Recent Labs  Lab 03/25/19 1021 03/26/19 0633 03/27/19 0427  WBC 7.6 5.3 4.9  NEUTROABS 5.4  --   --   HGB 10.2* 9.4* 9.4*  HCT 29.8* 27.5* 27.8*  MCV 92.0 91.4 91.4  PLT 188 165 143*   Cardiac Enzymes: No results for input(s): CKTOTAL, CKMB, CKMBINDEX, TROPONINI in the last 168 hours. BNP (last 3 results) No results for input(s): PROBNP in the last 8760 hours. CBG: Recent Labs  Lab 03/27/19 0839 03/27/19 1114 03/27/19 1651 03/27/19 2124 03/28/19 0549  GLUCAP 149* 169* 149* 158* 182*   D-Dimer: No results for input(s): DDIMER in the last 72 hours. Hgb A1c: Recent Labs    03/25/19 1021  HGBA1C 7.2*   Lipid Profile: No results for input(s): CHOL, HDL, LDLCALC, TRIG, CHOLHDL, LDLDIRECT in the last 72 hours. Thyroid function studies: No results for input(s): TSH, T4TOTAL, T3FREE, THYROIDAB in the last 72 hours.  Invalid input(s): FREET3 Anemia work up: No results for input(s): VITAMINB12, FOLATE, FERRITIN, TIBC, IRON, RETICCTPCT in the last 72 hours. Sepsis Labs: Recent Labs  Lab 03/25/19 1021 03/25/19 1223 03/26/19 0633 03/27/19 0427  WBC 7.6  --  5.3 4.9  LATICACIDVEN 1.4 0.9  --   --     Microbiology Recent Results (from the past 240 hour(s))  Aerobic Culture (superficial specimen)     Status: None   Collection Time: 03/19/19  1:20 PM   Specimen: Wound  Result Value Ref Range Status   Specimen  Description   Final    WOUND LEFT 2ND TOE Performed at [facility], [place] [address] [place], [place] [address]    Special Requests   Final    NONE Performed at [facility], [place] [address] [place], [place] [address]    Gram Stain   Final    NO WBC SEEN NO ORGANISMS SEEN Performed at [facility], [place] [address], [place], [place] [address]    Culture   Final    RARE ENTEROBACTER AEROGENES RARE CANDIDA PARAPSILOSIS RARE ALCALIGENES FAECALIS    Report Status 03/26/2019 FINAL  Final   Organism ID, Bacteria ENTEROBACTER AEROGENES  Final   Organism ID, Bacteria ALCALIGENES FAECALIS  Final  Susceptibility   Alcaligenes faecalis - MIC*    CEFAZOLIN 32 INTERMEDIATE Intermediate     GENTAMICIN 2 SENSITIVE Sensitive     CIPROFLOXACIN 1 SENSITIVE Sensitive     IMIPENEM 0.5 SENSITIVE Sensitive     TRIMETH/SULFA >=320 RESISTANT Resistant     * RARE ALCALIGENES FAECALIS   Enterobacter aerogenes - MIC*    CEFAZOLIN >=64 RESISTANT Resistant     CEFEPIME >=32 RESISTANT Resistant     CEFTAZIDIME <=1 SENSITIVE Sensitive     CEFTRIAXONE >=64 RESISTANT Resistant     CIPROFLOXACIN 0.5 SENSITIVE Sensitive     GENTAMICIN <=1 SENSITIVE Sensitive     IMIPENEM 2 SENSITIVE Sensitive     TRIMETH/SULFA <=20 SENSITIVE Sensitive     PIP/TAZO 8 SENSITIVE Sensitive     * RARE ENTEROBACTER AEROGENES  Culture, blood (Routine x 2)     Status: None (Preliminary result)   Collection Time: 03/25/19 10:20 AM   Specimen: BLOOD RIGHT HAND  Result Value Ref Range Status   Specimen Description BLOOD RIGHT HAND  Final   Special Requests   Final    BOTTLES DRAWN AEROBIC AND ANAEROBIC Blood Culture adequate volume   Culture   Final    NO GROWTH 2 DAYS Performed at [facility], [address] [place], [place] [address]    Report Status PENDING  Incomplete  SARS CORONAVIRUS 2 (TAT 6-24 HRS) Nasopharyngeal Nasopharyngeal Swab     Status: None   Collection  Time: 03/25/19 12:51 PM   Specimen: Nasopharyngeal Swab  Result Value Ref Range Status   SARS Coronavirus 2 NEGATIVE NEGATIVE Final    Comment: (NOTE) SARS-CoV-2 target nucleic acids are NOT DETECTED. The SARS-CoV-2 RNA is generally detectable in upper and lower respiratory specimens during the acute phase of infection. Negative results do not preclude SARS-CoV-2 infection, do not rule out co-infections with other pathogens, and should not be used as the sole basis for treatment or other patient management decisions. Negative results must be combined with clinical observations, patient history, and epidemiological information. The expected result is Negative. Fact Sheet for Patients: SugarRoll.be Fact Sheet for Healthcare Providers: https://www.woods-mathews.com/ This test is not yet approved or cleared by the [place] FDA and  has been authorized for detection and/or diagnosis of SARS-CoV-2 by FDA under an Emergency Use Authorization (EUA). This EUA will remain  in effect (meaning this test can be used) for the duration of the COVID-19 declaration under Section 56 4(b)(1) of the Act, 21 U.S.C. section 360bbb-3(b)(1), unless the authorization is terminated or revoked sooner. Performed at [facility], [place] [address] [place], [place] [address]   Culture, blood (Routine x 2)     Status: None (Preliminary result)   Collection Time: 03/25/19  1:01 PM   Specimen: BLOOD  Result Value Ref Range Status   Specimen Description BLOOD LEFT ANTECUBITAL  Final   Special Requests   Final    BOTTLES DRAWN AEROBIC AND ANAEROBIC Blood Culture results may not be optimal due to an excessive volume of blood received in culture bottles   Culture   Final    NO GROWTH 2 DAYS Performed at [facility], [place] [address], [place], [place] [address]    Report Status PENDING  Incomplete  MRSA PCR Screening     Status: None   Collection Time:  03/25/19  7:54 PM   Specimen: Nasal Mucosa; Nasopharyngeal  Result Value Ref Range Status   MRSA by PCR NEGATIVE NEGATIVE Final  Comment:        The GeneXpert MRSA Assay (FDA approved for NASAL specimens only), is one component of a comprehensive MRSA colonization surveillance program. It is not intended to diagnose MRSA infection nor to guide or monitor treatment for MRSA infections. Performed at [facility], [place] [address] [place], [place] [address]   Aerobic/Anaerobic Culture (surgical/deep wound)     Status: None (Preliminary result)   Collection Time: 03/27/19  8:11 AM   Specimen: Soft Tissue, Other  Result Value Ref Range Status   Specimen Description TISSUE  Final   Special Requests LEFT FOOT  Final   Gram Stain   Final    RARE WBC PRESENT,BOTH PMN AND MONONUCLEAR NO ORGANISMS SEEN Performed at [facility], [place] [address] [place], [place] [address]    Culture PENDING  Incomplete   Report Status PENDING  Incomplete    Procedures and diagnostic studies:  VAS [place] ABI WITH/WO TBI  Result Date: 03/27/2019 LOWER EXTREMITY DOPPLER STUDY Indications: Gangrene.  Limitations: Today's exam was limited due to large toe circumference. Comparison Study: No prior study Performing Technologist: Maudry Mayhew MHA, RVT, RDCS, RDMS  Examination Guidelines: A complete evaluation includes at minimum, Doppler waveform signals and systolic blood pressure reading at the level of bilateral brachial, anterior tibial, and posterior tibial arteries, when vessel segments are accessible. Bilateral testing is considered an integral part of a complete examination. Photoelectric Plethysmograph (PPG) waveforms and toe systolic pressure readings are included as required and additional duplex testing as needed. Limited examinations for reoccurring indications may be performed as noted.  ABI Findings: +---------+------------------+-----+---------+--------+ Right    Rt Pressure  (mmHg)IndexWaveform Comment  +---------+------------------+-----+---------+--------+ Brachial 125                    triphasic         +---------+------------------+-----+---------+--------+ PTA      204               1.63 triphasic         +---------+------------------+-----+---------+--------+ DP       158               1.26 triphasic         +---------+------------------+-----+---------+--------+ Great Toe                       Present           +---------+------------------+-----+---------+--------+ +---------+------------------+-----+----------+-------+ Left     Lt Pressure (mmHg)IndexWaveform  Comment +---------+------------------+-----+----------+-------+ Brachial 123                    triphasic         +---------+------------------+-----+----------+-------+ PTA      145               1.16 monophasic        +---------+------------------+-----+----------+-------+ DP       182               1.46 triphasic         +---------+------------------+-----+----------+-------+ Great Toe                       Present           +---------+------------------+-----+----------+-------+ +-------+-----------+-----------+------------+------------+ ABI/TBIToday's ABIToday's TBIPrevious ABIPrevious TBI +-------+-----------+-----------+------------+------------+ Right  1.63                                           +-------+-----------+-----------+------------+------------+  Left   1.46                                           +-------+-----------+-----------+------------+------------+ Unable to obtain toe pressures due to great toe being too large for cuff.  Summary: Right: Resting right ankle-brachial index is elevated suggestive of medial arterial calcification. Left: Resting left ankle-brachial index is elevated suggestive of medial arterial calcification.  *See table(s) above for measurements and observations.  Electronically signed by Harold Barban  MD on 03/27/2019 at 7:06:28 PM.   Final     Medications:   . allopurinol  300 mg Oral Daily  . amitriptyline  25 mg Oral QHS  . aspirin EC  81 mg Oral Daily  . docusate sodium  100 mg Oral BID  . enoxaparin (LOVENOX) injection  40 mg Subcutaneous Q24H  . gabapentin  300 mg Oral TID  . insulin aspart  0-15 Units Subcutaneous TID WC  . insulin aspart  0-5 Units Subcutaneous QHS  . insulin detemir  40 Units Subcutaneous QHS  . lisinopril  40 mg Oral Daily  . metoprolol succinate  100 mg Oral Daily  . mupirocin ointment  1 application Nasal BID   Continuous Infusions: . sodium chloride 10 mL/hr at 03/27/19 0934  . methocarbamol (ROBAXIN) IV    . piperacillin-tazobactam (ZOSYN)  IV 3.375 g (03/28/19 0545)  . vancomycin 1,250 mg (03/28/19 1021)     LOS: 2 days   Geradine Girt  [facility]   How to contact the [facility] Attending or Consulting provider [place] or covering provider during after hours [place], for this patient?  1. Check the care team in [facility] and look for a) attending/consulting TRH provider listed and b) the [facility] team listed 2. Log into [email or web address] and use [place]'s universal password to access. If you do not have the password, please contact the hospital operator. 3. Locate the [facility] provider you are looking for under [facility] and page to a number that you can be directly reached. 4. If you still have difficulty reaching the provider, please page the [facility] (Director on Call) for the Hospitalists listed on amion for assistance.  03/28/2019, 10:42 AM

## 2019-03-29 ENCOUNTER — Encounter (HOSPITAL_BASED_OUTPATIENT_CLINIC_OR_DEPARTMENT_OTHER): Payer: Medicaid Other | Admitting: Internal Medicine

## 2019-03-29 DIAGNOSIS — B9689 Other specified bacterial agents as the cause of diseases classified elsewhere: Secondary | ICD-10-CM

## 2019-03-29 DIAGNOSIS — Z89421 Acquired absence of other right toe(s): Secondary | ICD-10-CM

## 2019-03-29 DIAGNOSIS — L97419 Non-pressure chronic ulcer of right heel and midfoot with unspecified severity: Secondary | ICD-10-CM

## 2019-03-29 DIAGNOSIS — I96 Gangrene, not elsewhere classified: Secondary | ICD-10-CM

## 2019-03-29 DIAGNOSIS — M109 Gout, unspecified: Secondary | ICD-10-CM

## 2019-03-29 LAB — CBC
HCT: 22.7 % — ABNORMAL LOW (ref 39.0–52.0)
Hemoglobin: 7.7 g/dL — ABNORMAL LOW (ref 13.0–17.0)
MCH: 30.7 pg (ref 26.0–34.0)
MCHC: 33.9 g/dL (ref 30.0–36.0)
MCV: 90.4 fL (ref 80.0–100.0)
Platelets: 141 10*3/uL — ABNORMAL LOW (ref 150–400)
RBC: 2.51 MIL/uL — ABNORMAL LOW (ref 4.22–5.81)
RDW: 15.8 % — ABNORMAL HIGH (ref 11.5–15.5)
WBC: 6.8 10*3/uL (ref 4.0–10.5)
nRBC: 0 % (ref 0.0–0.2)

## 2019-03-29 LAB — GLUCOSE, CAPILLARY
Glucose-Capillary: 186 mg/dL — ABNORMAL HIGH (ref 70–99)
Glucose-Capillary: 193 mg/dL — ABNORMAL HIGH (ref 70–99)
Glucose-Capillary: 184 mg/dL — ABNORMAL HIGH (ref 70–99)
Glucose-Capillary: 260 mg/dL — ABNORMAL HIGH (ref 70–99)

## 2019-03-29 LAB — BASIC METABOLIC PANEL
Anion gap: 10 (ref 5–15)
BUN: 23 mg/dL (ref 8–23)
CO2: 23 mmol/L (ref 22–32)
Calcium: 8.8 mg/dL — ABNORMAL LOW (ref 8.9–10.3)
Chloride: 101 mmol/L (ref 98–111)
Creatinine, Ser: 1.56 mg/dL — ABNORMAL HIGH (ref 0.61–1.24)
GFR calc Af Amer: 55 mL/min — ABNORMAL LOW (ref >60)
GFR calc non Af Amer: 47 mL/min — ABNORMAL LOW (ref >60)
Glucose, Bld: 210 mg/dL — ABNORMAL HIGH (ref 70–99)
Potassium: 4.3 mmol/L (ref 3.5–5.1)
Sodium: 134 mmol/L — ABNORMAL LOW (ref 135–145)

## 2019-03-29 MED ORDER — INSULIN ASPART 100 UNIT/ML ~~LOC~~ SOLN
0.0000 [IU] | Freq: Every day | SUBCUTANEOUS | Status: DC
  Administered 2019-03-29 – 2019-03-30 (×2): 3 [IU] via SUBCUTANEOUS

## 2019-03-29 MED ORDER — PREDNISONE 20 MG PO TABS
30.0000 mg | ORAL_TABLET | Freq: Once | ORAL | Status: AC
  Administered 2019-03-29: 30 mg via ORAL
  Filled 2019-03-29: qty 1

## 2019-03-29 MED ORDER — COLCHICINE 0.3 MG HALF TABLET
0.3000 mg | ORAL_TABLET | Freq: Every day | ORAL | Status: DC
  Administered 2019-03-30 – 2019-03-31 (×2): 0.3 mg via ORAL
  Filled 2019-03-29 (×2): qty 1

## 2019-03-29 MED ORDER — INSULIN DETEMIR 100 UNIT/ML ~~LOC~~ SOLN
50.0000 [IU] | Freq: Every day | SUBCUTANEOUS | Status: DC
  Administered 2019-03-29 – 2019-03-30 (×2): 50 [IU] via SUBCUTANEOUS
  Filled 2019-03-29 (×3): qty 0.5

## 2019-03-29 MED ORDER — INDOMETHACIN 25 MG PO CAPS
25.0000 mg | ORAL_CAPSULE | Freq: Three times a day (TID) | ORAL | Status: AC
  Administered 2019-03-29 – 2019-03-30 (×3): 25 mg via ORAL
  Filled 2019-03-29 (×3): qty 1

## 2019-03-29 MED ORDER — INSULIN ASPART 100 UNIT/ML ~~LOC~~ SOLN
0.0000 [IU] | Freq: Three times a day (TID) | SUBCUTANEOUS | Status: DC
  Administered 2019-03-29: 17:00:00 4 [IU] via SUBCUTANEOUS
  Administered 2019-03-30: 17:00:00 3 [IU] via SUBCUTANEOUS
  Administered 2019-03-30: 13:00:00 4 [IU] via SUBCUTANEOUS
  Administered 2019-03-30: 7 [IU] via SUBCUTANEOUS
  Administered 2019-03-31: 07:00:00 3 [IU] via SUBCUTANEOUS
  Administered 2019-03-31: 11:00:00 4 [IU] via SUBCUTANEOUS

## 2019-03-29 NOTE — Progress Notes (Signed)
Progress Note    [name]  VFI:[id number] DOB: 01-02-1956  DOA: 03/25/2019 PCP: System, Pcp Not In    Brief Narrative:     Medical records reviewed and are as summarized below:  [name] is an 64 y.o. male with hx DM, HTN, chronic pain on daily opioids and hx osteomyelitis of the R humerus at [facility] in 2015 and osteomyelitis of the left foot in 2020 who presents with slowly progressive redness and swelling of the left foot.  The patient is only able to give vague details about his left foot infection, but able to articulate that he has had this infection for "about 2 years", it has been debrided at least once, and he has had a PICC line for extended IV antibiotics at least once.  Since last on IV antibiotics, which he estimates about 9 months ago, his foot has been well until the beginning of this year when it became red and discolored again.  At that time he was referred to the hyperbaric clinic in [place] where he started to go regularly.  Recent cultures (presumably surface swabs?) from their clinic showed polymicrobial infection.  Started on cephalexin without improvement.   Assessment/Plan:   Principal Problem:   Diabetic foot ulcer ([place]) Active Problems:   DM (diabetes mellitus), type 2, uncontrolled ([place])   Essential hypertension, benign   Osteomyelitis of left foot (HCC)   Idiopathic chronic gout of left foot without tophus   Osteomyelitis of the left foot - [facility]: noted that the patient had had an operative debridement for osteomyelitis in Dec 2020 -ID consult appreciated: They recommend ABI and consult with orthopedics for possible TMA -Culture from surgery negative so will DC all antibiotics, appreciate ID consult -Status post surgery: Plan is for antibiotics per culture sensitivities, nonweightbearing on the left, wound VAC, follow-up with [name] in 1 week  Renal insufficiency -Improved  Normocytic anemia -Trending down  so we will monitor for the a.m. -Some expected blood loss post surgery - transfuse for less than 7  Diabetes type 2 with polyneuropathy -Continue home Levemir, reduced dose to 40 daily -Sliding scale correction insulin ordered -With steroids will need to make adjustments for better control  Hypertension -Continue  Lasix, lisinopril, metoprolol -Blood pressure on the lower end of normal  Gout flare -right knee acute gout -Patient is insistent on indomethacin despite counseling about difficulty on kidneys.  Have had a long discussion about a trial of steroids to help with gout flare but we will need to monitor his sugars closely and adjust his insulin while on the steroids -Continue colchicine -Unfortunately since patient is nonweightbearing on left the fact that he has gout in his right knee makes it very  difficult for him to ambulate and for him to be discharged   obesity Body mass index is 30.56 kg/m.   Family Communication/Anticipated D/C date and plan/Code Status   DVT prophylaxis: Lovenox ordered. Code Status: Full Code.  Family Communication: Patient declined phone call to family Disposition Plan: s/p surgery on 3/13, await PT evaluation for safe discharge home as well as home health arrangements for wound VAC   Medical Consultants:    ID  [name]     Subjective:   Still complaining of right knee pain  Objective:    Vitals:   03/28/19 1800 03/29/19 0106 03/29/19 0556 03/29/19 1217  BP: (!) 143/68 118/67 127/70 127/73  Pulse: 87 (!) 106 83 92  Resp: 19 17 17  18  Temp: 98.2 F (36.8 C) 99.3 F (37.4 C)  99.6 F (37.6 C)  TempSrc: Oral Oral  Oral  SpO2: 97% 95% 96% 97%  Weight:      Height:        Intake/Output Summary (Last 24 hours) at 03/29/2019 1604 Last data filed at 03/29/2019 0612 Gross per 24 hour  Intake 880 ml  Output 2700 ml  Net -1820 ml   Filed Weights   03/26/19 0800  Weight: 108 kg    Exam: In bed, no acute  distress Left foot with wound VAC Right knee swollen and hot to touch   Data Reviewed:   I have personally reviewed following labs and imaging studies:  Labs: Labs show the following:   Basic Metabolic Panel: Recent Labs  Lab 03/25/19 1021 03/25/19 1021 03/26/19 0633 03/26/19 0633 03/27/19 0427 03/29/19 0510  NA 135  --  139  --  138 134*  K 5.4*   < > 4.3   < > 4.7 4.3  CL 102  --  102  --  103 101  CO2 21*  --  24  --  25 23  GLUCOSE 180*  --  198*  --  167* 210*  BUN 29*  --  21  --  20 23  CREATININE 1.30*  --  1.16  --  1.24 1.56*  CALCIUM 10.2  --  9.7  --  9.6 8.8*   < > = values in this interval not displayed.   GFR Estimated Creatinine Clearance: 65.1 mL/min (A) (by C-G formula based on SCr of 1.56 mg/dL (H)). Liver Function Tests: Recent Labs  Lab 03/25/19 1021  AST 15  ALT 11  ALKPHOS 70  BILITOT 0.6  PROT 8.0  ALBUMIN 3.7   No results for input(s): LIPASE, AMYLASE in the last 168 hours. No results for input(s): AMMONIA in the last 168 hours. Coagulation profile Recent Labs  Lab 03/25/19 1021  INR 1.1    CBC: Recent Labs  Lab 03/25/19 1021 03/26/19 0633 03/27/19 0427 03/29/19 0510  WBC 7.6 5.3 4.9 6.8  NEUTROABS 5.4  --   --   --   HGB 10.2* 9.4* 9.4* 7.7*  HCT 29.8* 27.5* 27.8* 22.7*  MCV 92.0 91.4 91.4 90.4  PLT 188 165 143* 141*   Cardiac Enzymes: No results for input(s): CKTOTAL, CKMB, CKMBINDEX, TROPONINI in the last 168 hours. BNP (last 3 results) No results for input(s): PROBNP in the last 8760 hours. CBG: Recent Labs  Lab 03/28/19 1053 03/28/19 1606 03/28/19 2137 03/29/19 0554 03/29/19 1106  GLUCAP 191* 148* 160* 186* 193*   D-Dimer: No results for input(s): DDIMER in the last 72 hours. Hgb A1c: No results for input(s): HGBA1C in the last 72 hours. Lipid Profile: No results for input(s): CHOL, HDL, LDLCALC, TRIG, CHOLHDL, LDLDIRECT in the last 72 hours. Thyroid function studies: No results for input(s): TSH,  T4TOTAL, T3FREE, THYROIDAB in the last 72 hours.  Invalid input(s): FREET3 Anemia work up: No results for input(s): VITAMINB12, FOLATE, FERRITIN, TIBC, IRON, RETICCTPCT in the last 72 hours. Sepsis Labs: Recent Labs  Lab 03/25/19 1021 03/25/19 1223 03/26/19 0633 03/27/19 0427 03/29/19 0510  WBC 7.6  --  5.3 4.9 6.8  LATICACIDVEN 1.4 0.9  --   --   --     Microbiology Recent Results (from the past 240 hour(s))  Culture, blood (Routine x 2)     Status: None (Preliminary result)   Collection Time: 03/25/19 10:20 AM  Specimen: BLOOD RIGHT HAND  Result Value Ref Range Status   Specimen Description BLOOD RIGHT HAND  Final   Special Requests   Final    BOTTLES DRAWN AEROBIC AND ANAEROBIC Blood Culture adequate volume   Culture NO GROWTH 4 DAYS  Final   Report Status PENDING  Incomplete  SARS CORONAVIRUS 2 (TAT 6-24 HRS) Nasopharyngeal Nasopharyngeal Swab     Status: None   Collection Time: 03/25/19 12:51 PM   Specimen: Nasopharyngeal Swab  Result Value Ref Range Status   SARS Coronavirus 2 NEGATIVE NEGATIVE Final    Comment: (NOTE) SARS-CoV-2 target nucleic acids are NOT DETECTED. The SARS-CoV-2 RNA is generally detectable in upper and lower respiratory specimens during the acute phase of infection. Negative results do not preclude SARS-CoV-2 infection, do not rule out co-infections with other pathogens, and should not be used as the sole basis for treatment or other patient management decisions. Negative results must be combined with clinical observations, patient history, and epidemiological information. The expected result is Negative. Fact Sheet for Patients: SugarRoll.be Fact Sheet for Healthcare Providers: https://www.woods-mathews.com/ This test is not yet approved or cleared by the [place] FDA and  has been authorized for detection and/or diagnosis of SARS-CoV-2 by FDA under an Emergency Use Authorization (EUA). This EUA  will remain  in effect (meaning this test can be used) for the duration of the COVID-19 declaration under Section 56 4(b)(1) of the Act, 21 U.S.C. section 360bbb-3(b)(1), unless the authorization is terminated or revoked sooner. Performed at [facility], [place] [address] [place] [address], [place], [place] [address]   Culture, blood (Routine x 2)     Status: None (Preliminary result)   Collection Time: 03/25/19  1:01 PM   Specimen: BLOOD  Result Value Ref Range Status   Specimen Description BLOOD LEFT ANTECUBITAL  Final   Special Requests   Final    BOTTLES DRAWN AEROBIC AND ANAEROBIC Blood Culture results may not be optimal due to an excessive volume of blood received in culture bottles   Culture NO GROWTH 4 DAYS  Final   Report Status PENDING  Incomplete  MRSA PCR Screening     Status: None   Collection Time: 03/25/19  7:54 PM   Specimen: Nasal Mucosa; Nasopharyngeal  Result Value Ref Range Status   MRSA by PCR NEGATIVE NEGATIVE Final    Comment:        The GeneXpert MRSA Assay (FDA approved for NASAL specimens only), is one component of a comprehensive MRSA colonization surveillance program. It is not intended to diagnose MRSA infection nor to guide or monitor treatment for MRSA infections. Performed at [facility], [place] [address], [place], [place] [address]   Aerobic/Anaerobic Culture (surgical/deep wound)     Status: None (Preliminary result)   Collection Time: 03/27/19  8:11 AM   Specimen: Soft Tissue, Other  Result Value Ref Range Status   Specimen Description TISSUE  Final   Special Requests LEFT FOOT  Final   Gram Stain   Final    RARE WBC PRESENT,BOTH PMN AND MONONUCLEAR NO ORGANISMS SEEN Performed at [facility], [place] [address], [place], [place] [address]    Culture   Final    MODERATE ENTEROBACTER AEROGENES NO ANAEROBES ISOLATED; CULTURE IN PROGRESS FOR 5 DAYS    Report Status PENDING  Incomplete   Organism ID, Bacteria ENTEROBACTER AEROGENES  Final       Susceptibility   Enterobacter aerogenes - MIC*    CEFAZOLIN >=64 RESISTANT  Resistant     CEFEPIME 0.5 SENSITIVE Sensitive     CEFTAZIDIME <=1 SENSITIVE Sensitive     CEFTRIAXONE >=64 RESISTANT Resistant     CIPROFLOXACIN <=0.25 SENSITIVE Sensitive     GENTAMICIN <=1 SENSITIVE Sensitive     IMIPENEM 0.5 SENSITIVE Sensitive     TRIMETH/SULFA >=320 RESISTANT Resistant     PIP/TAZO 8 SENSITIVE Sensitive     * MODERATE ENTEROBACTER AEROGENES    Procedures and diagnostic studies:  No results found.  Medications:   . allopurinol  300 mg Oral Daily  . amitriptyline  25 mg Oral QHS  . aspirin EC  81 mg Oral Daily  . [START ON 03/30/2019] colchicine  0.3 mg Oral Daily  . docusate sodium  100 mg Oral BID  . enoxaparin (LOVENOX) injection  40 mg Subcutaneous Q24H  . gabapentin  300 mg Oral TID  . indomethacin  25 mg Oral TID WC  . insulin aspart  0-15 Units Subcutaneous TID WC  . insulin aspart  0-5 Units Subcutaneous QHS  . insulin detemir  40 Units Subcutaneous QHS  . metoprolol succinate  100 mg Oral Daily  . mupirocin ointment  1 application Nasal BID  . predniSONE  30 mg Oral Once   Continuous Infusions: . sodium chloride 10 mL/hr at 03/27/19 0934  . methocarbamol (ROBAXIN) IV       LOS: 3 days   Geradine Girt  [facility]   How to contact the [facility] Attending or Consulting provider [place] or covering provider during after hours [place], for this patient?  1. Check the care team in [facility] and look for a) attending/consulting TRH provider listed and b) the [facility] team listed 2. Log into [email or web address] and use [facility]'s universal password to access. If you do not have the password, please contact the hospital operator. 3. Locate the [facility] provider you are looking for under [facility] and page to a number that you can be directly reached. 4. If you still have difficulty reaching the provider, please page the [facility] (Director on Call) for the Hospitalists listed on amion  for assistance.  03/29/2019, 4:04 PM

## 2019-03-29 NOTE — Progress Notes (Signed)
[facility] for Infectious Disease   Reason for visit: Follow up on gangrene  Interval History: s/p TM amputation.  WBC wnl, no fever.  New complaint of worsening gout in his right foot.     Physical Exam: Constitutional:  Vitals:   03/29/19 0106 03/29/19 0556  BP: 118/67 127/70  Pulse: (!) 106 83  Resp: 17 17  Temp: 99.3 F (37.4 C)   SpO2: 95% 96%   patient appears in NAD Eyes: anicteric Respiratory: Normal respiratory effort; CTA B Cardiovascular: RRR GI: soft, nt, nd  Review of Systems: Constitutional: negative for fevers and chills Gastrointestinal: negative for nausea and diarrhea  Lab Results  Component Value Date   WBC 6.8 03/29/2019   HGB 7.7 (L) 03/29/2019   HCT 22.7 (L) 03/29/2019   MCV 90.4 03/29/2019   PLT 141 (L) 03/29/2019    Lab Results  Component Value Date   CREATININE 1.56 (H) 03/29/2019   BUN 23 03/29/2019   NA 134 (L) 03/29/2019   K 4.3 03/29/2019   CL 101 03/29/2019   CO2 23 03/29/2019    Lab Results  Component Value Date   ALT 11 03/25/2019   AST 15 03/25/2019   ALKPHOS 70 03/25/2019     Microbiology: Recent Results (from the past 240 hour(s))  Aerobic Culture (superficial specimen)     Status: None   Collection Time: 03/19/19  1:20 PM   Specimen: Wound  Result Value Ref Range Status   Specimen Description   Final    WOUND LEFT 2ND TOE Performed at [facility], [place] [address] [place], [place] [address]    Special Requests   Final    NONE Performed at [facility], [place] [address], [place], [place] [address]    Gram Stain   Final    NO WBC SEEN NO ORGANISMS SEEN Performed at [facility], [place] [address], [place], [place] [address]    Culture   Final    RARE ENTEROBACTER AEROGENES RARE CANDIDA PARAPSILOSIS RARE ALCALIGENES FAECALIS    Report Status 03/26/2019 FINAL  Final   Organism ID, Bacteria ENTEROBACTER AEROGENES  Final   Organism ID, Bacteria ALCALIGENES  FAECALIS  Final      Susceptibility   Alcaligenes faecalis - MIC*    CEFAZOLIN 32 INTERMEDIATE Intermediate     GENTAMICIN 2 SENSITIVE Sensitive     CIPROFLOXACIN 1 SENSITIVE Sensitive     IMIPENEM 0.5 SENSITIVE Sensitive     TRIMETH/SULFA >=320 RESISTANT Resistant     * RARE ALCALIGENES FAECALIS   Enterobacter aerogenes - MIC*    CEFAZOLIN >=64 RESISTANT Resistant     CEFEPIME >=32 RESISTANT Resistant     CEFTAZIDIME <=1 SENSITIVE Sensitive     CEFTRIAXONE >=64 RESISTANT Resistant     CIPROFLOXACIN 0.5 SENSITIVE Sensitive     GENTAMICIN <=1 SENSITIVE Sensitive     IMIPENEM 2 SENSITIVE Sensitive     TRIMETH/SULFA <=20 SENSITIVE Sensitive     PIP/TAZO 8 SENSITIVE Sensitive     * RARE ENTEROBACTER AEROGENES  Culture, blood (Routine x 2)     Status: None (Preliminary result)   Collection Time: 03/25/19 10:20 AM   Specimen: BLOOD RIGHT HAND  Result Value Ref Range Status   Specimen Description BLOOD RIGHT HAND  Final   Special Requests   Final    BOTTLES DRAWN AEROBIC AND ANAEROBIC Blood Culture adequate volume   Culture NO GROWTH 4 DAYS  Final   Report Status  PENDING  Incomplete  SARS CORONAVIRUS 2 (TAT 6-24 HRS) Nasopharyngeal Nasopharyngeal Swab     Status: None   Collection Time: 03/25/19 12:51 PM   Specimen: Nasopharyngeal Swab  Result Value Ref Range Status   SARS Coronavirus 2 NEGATIVE NEGATIVE Final    Comment: (NOTE) SARS-CoV-2 target nucleic acids are NOT DETECTED. The SARS-CoV-2 RNA is generally detectable in upper and lower respiratory specimens during the acute phase of infection. Negative results do not preclude SARS-CoV-2 infection, do not rule out co-infections with other pathogens, and should not be used as the sole basis for treatment or other patient management decisions. Negative results must be combined with clinical observations, patient history, and epidemiological information. The expected result is Negative. Fact Sheet for  Patients: SugarRoll.be Fact Sheet for Healthcare Providers: https://www.woods-mathews.com/ This test is not yet approved or cleared by the [place] FDA and  has been authorized for detection and/or diagnosis of SARS-CoV-2 by FDA under an Emergency Use Authorization (EUA). This EUA will remain  in effect (meaning this test can be used) for the duration of the COVID-19 declaration under Section 56 4(b)(1) of the Act, 21 U.S.C. section 360bbb-3(b)(1), unless the authorization is terminated or revoked sooner. Performed at [facility], [place] [address] [place], [place] [address]   Culture, blood (Routine x 2)     Status: None (Preliminary result)   Collection Time: 03/25/19  1:01 PM   Specimen: BLOOD  Result Value Ref Range Status   Specimen Description BLOOD LEFT ANTECUBITAL  Final   Special Requests   Final    BOTTLES DRAWN AEROBIC AND ANAEROBIC Blood Culture results may not be optimal due to an excessive volume of blood received in culture bottles   Culture NO GROWTH 4 DAYS  Final   Report Status PENDING  Incomplete  MRSA PCR Screening     Status: None   Collection Time: 03/25/19  7:54 PM   Specimen: Nasal Mucosa; Nasopharyngeal  Result Value Ref Range Status   MRSA by PCR NEGATIVE NEGATIVE Final    Comment:        The GeneXpert MRSA Assay (FDA approved for NASAL specimens only), is one component of a comprehensive MRSA colonization surveillance program. It is not intended to diagnose MRSA infection nor to guide or monitor treatment for MRSA infections. Performed at [facility], [place] [address], [place], [place] [address]   Aerobic/Anaerobic Culture (surgical/deep wound)     Status: None (Preliminary result)   Collection Time: 03/27/19  8:11 AM   Specimen: Soft Tissue, Other  Result Value Ref Range Status   Specimen Description TISSUE  Final   Special Requests LEFT FOOT  Final   Gram Stain   Final    RARE WBC  PRESENT,BOTH PMN AND MONONUCLEAR NO ORGANISMS SEEN Performed at [facility], [place] [address], [place], [place] [address]    Culture MODERATE ENTEROBACTER AEROGENES  Final   Report Status PENDING  Incomplete    Impression/Plan:  1. Osteomyelitis/gangrene - now s/p amputation by [name] on 3/13.  There was a chronic plantar ulcer noted that was excised and sent for culture, growing Enterobacter.   Unclear significance of this with culture from an excised area. I do not feel prolonged antibiotics indicated and will stop antibiotics now.    2.  Gout - his main complaint is gout of right foot, getting NSAIDS, colchicine.    I will sign off, call with any questions.

## 2019-03-29 NOTE — Progress Notes (Signed)
PT Cancellation Note  Patient Details Name: [name] MRN: [id number] DOB: [birth year]   Cancelled Treatment:    Reason Eval/Treat Not Completed: Pain limiting ability to participate as R knee is a 10+/10. Pt reports "I am not getting my Gout medicine." Spoke with Dr. Eliseo Squires as pt is NWB on L LE, lives alone and is unable to WB on R LE due to pain in R knee. Pt unsafe to return home at this time as he lives alone and is unable to mobilize. Acute PT to return as able, pending pain management to progress mobility in prep for d/c.  [name], PT, DPT Acute Rehabilitation Services Pager #: [phone] Office #: [phone]    [name] 03/29/2019, 11:14 AM

## 2019-03-29 NOTE — Care Management (Signed)
    Durable Medical Equipment  (From admission, onward)         Start     Ordered   03/29/19 1321  For home use only DME 3 n 1  Once    Comments: Drop arm   03/29/19 1321   03/29/19 1321  For home use only DME Walker rolling  Once    Question Answer Comment  Walker: With [place] Wheels   Patient needs a walker to treat with the following condition Weakness      03/29/19 1321   03/29/19 1320  For home use only DME lightweight manual wheelchair with seat cushion  Once    Comments: Patient suffers from  TRANSMETATARSAL AMPUTATION left foot which impairs their ability to perform daily activities like ambulating  in the home.  A cane will not resolve  issue with performing activities of daily living. A wheelchair will allow patient to safely perform daily activities. Patient is not able to propel themselves in the home using a standard weight wheelchair due to TRANSMETATARSAL AMPUTATION left foot. Patient can self propel in the lightweight wheelchair. Length of need life time . Accessories: elevating leg rests (ELRs), wheel locks, extensions and anti-tippers.  Seat and back cushions   03/29/19 1320

## 2019-03-29 NOTE — Progress Notes (Signed)
Patient ID: [name], male   DOB: [birth year], 64 y.o.   MRN: [id number] Patient is postoperative day 2 left transmetatarsal amputation.  The wound VAC was not plugged in and the suction canister was not completely attached.  The wound VAC was reconnected and working well.  Cultures from the tissue intraoperatively are negative to date.  Patient may discharge to home with the portable Praveena wound VAC pump once he is able to ambulate independently without weightbearing on the left foot.

## 2019-03-29 NOTE — TOC Initial Note (Addendum)
Transition of Care [facility]) - Initial/Assessment Note    Patient Details  Name: [name] MRN: [id number] Date of Birth: [birth year]  Transition of Care [facility]) CM/SW Contact:    Marilu Favre, RN Phone Number: 03/29/2019, 1:23 PM  Clinical Narrative:                 Confirmed face sheet information. Patient from home alone, however his son and daughter in law from [place] are on their way to [facility] and will stay with patient at discharge.  Ordered wheel chair , drop arm bedside commode and walker with [employer]. Patient has transportation home. Patient wanting [facility] for [facility]  Patient active with [facility] PTA for [facility]. Spoke with Butch Penny with [facility] they can provide [facility] and PT at discharge.   Patient  portable Praveena wound VAC pump   Expected Discharge Plan: [place] Barriers to Discharge: Continued Medical Work up   Patient Goals and CMS Choice Patient states their goals for this hospitalization and ongoing recovery are:: to go home CMS Medicare.gov Compare Post Acute Care list provided to:: Patient Choice offered to / list presented to : Patient  Expected Discharge Plan and Services Expected Discharge Plan: [place]   Discharge Planning Services: CM Consult Post Acute Care Choice: Home Health, Durable Medical Equipment Living arrangements for the past 2 months: [place]                 DME Arranged: 3-N-1, Youth worker wheelchair with seat cushion, Walker rolling DME Agency: [employer] Date DME Agency Contacted: 03/29/19 Time DME Agency Contacted: [phone] Representative spoke with at DME Agency: [place]: PT, RN [place] Agency: [place] ([place]) Date [place]: 03/29/19 Time [place]: 1322 Representative spoke with at [place]: Butch Penny  Prior Living Arrangements/Services Living arrangements for the past 2 months: [place] with:: Self Patient language and need for  interpreter reviewed:: Yes Do you feel safe going back to the place where you live?: Yes      Need for Family Participation in Patient Care: Yes (Comment) Care giver support system in place?: Yes (comment)   Criminal Activity/Legal Involvement Pertinent to Current Situation/Hospitalization: No - Comment as needed  Activities of Daily Living Home Assistive Devices/Equipment: Crutches ADL Screening (condition at time of admission) Patient's cognitive ability adequate to safely complete daily activities?: Yes Is the patient deaf or have difficulty hearing?: No Does the patient have difficulty seeing, even when wearing glasses/contacts?: No Does the patient have difficulty concentrating, remembering, or making decisions?: No Patient able to express need for assistance with ADLs?: Yes Does the patient have difficulty dressing or bathing?: No Independently performs ADLs?: Yes (appropriate for developmental age) Does the patient have difficulty walking or climbing stairs?: Yes Weakness of Legs: Left Weakness of Arms/Hands: None  Permission Sought/Granted   Permission granted to share information with : No              Emotional Assessment Appearance:: Appears stated age Attitude/Demeanor/Rapport: Engaged Affect (typically observed): Accepting Orientation: : Oriented to  Time, Oriented to Situation, Oriented to Place, Oriented to Self Alcohol / Substance Use: Not Applicable Psych Involvement: No (comment)  Admission diagnosis:  Diabetic foot ulcer ([place]) [P53.614, L97.509] Osteomyelitis of left foot, unspecified type [facility]) [M86.9] Patient Active Problem List   Diagnosis Date Noted  . Diabetic foot ulcer ([place]) 03/25/2019  . Idiopathic chronic gout of left foot without tophus   .  Myelopathy ([place]) 07/16/2013  . Ingrown nail 07/05/2013  . Paronychia 07/05/2013  . Diverticulosis 04/22/2013  . Acute osteomyelitis of humerus ([place]) 02/07/2013  . Osteomyelitis of left foot ([place])  02/01/2013  . Joint infection ([place]) 01/22/2013  . Pyogenic arthritis of shoulder region ([place]) 01/15/2013  . Nephrolithiasis 01/23/2012  . Lumbar radiculopathy 02/28/2011  . Gout flare 04/30/2010  . Chronic pain 02/14/2010  . NASH (nonalcoholic steatohepatitis) 11/08/2008  . Obesity 10/27/2008  . Hypertriglyceridemia 10/27/2007  . DM (diabetes mellitus), type 2, uncontrolled ([place]) 03/12/2006  . Essential hypertension, benign 03/12/2006   PCP:  System, Pcp Not In Pharmacy:   [id number] [place] [place]Phone: [phone]: [phone]    Social Determinants of Health (SDOH) Interventions    Readmission Risk Interventions No flowsheet data found.

## 2019-03-30 ENCOUNTER — Telehealth: Payer: Self-pay | Admitting: Orthopedic Surgery

## 2019-03-30 ENCOUNTER — Encounter (HOSPITAL_BASED_OUTPATIENT_CLINIC_OR_DEPARTMENT_OTHER): Payer: Medicaid Other | Admitting: Internal Medicine

## 2019-03-30 LAB — CULTURE, BLOOD (ROUTINE X 2)
Culture: NO GROWTH
Culture: NO GROWTH
Special Requests: ADEQUATE

## 2019-03-30 LAB — HEMOGLOBIN AND HEMATOCRIT, BLOOD
HCT: 21.8 % — ABNORMAL LOW (ref 39.0–52.0)
Hemoglobin: 7.6 g/dL — ABNORMAL LOW (ref 13.0–17.0)

## 2019-03-30 LAB — CBC
HCT: 21.5 % — ABNORMAL LOW (ref 39.0–52.0)
Hemoglobin: 7.3 g/dL — ABNORMAL LOW (ref 13.0–17.0)
MCH: 30.8 pg (ref 26.0–34.0)
MCHC: 34 g/dL (ref 30.0–36.0)
MCV: 90.7 fL (ref 80.0–100.0)
Platelets: 152 10*3/uL (ref 150–400)
RBC: 2.37 MIL/uL — ABNORMAL LOW (ref 4.22–5.81)
RDW: 15.9 % — ABNORMAL HIGH (ref 11.5–15.5)
WBC: 6.1 10*3/uL (ref 4.0–10.5)
nRBC: 0 % (ref 0.0–0.2)

## 2019-03-30 LAB — GLUCOSE, CAPILLARY
Glucose-Capillary: 241 mg/dL — ABNORMAL HIGH (ref 70–99)
Glucose-Capillary: 191 mg/dL — ABNORMAL HIGH (ref 70–99)
Glucose-Capillary: 133 mg/dL — ABNORMAL HIGH (ref 70–99)
Glucose-Capillary: 263 mg/dL — ABNORMAL HIGH (ref 70–99)

## 2019-03-30 MED ORDER — METOPROLOL SUCCINATE ER 50 MG PO TB24
50.0000 mg | ORAL_TABLET | Freq: Every day | ORAL | Status: DC
  Administered 2019-03-31: 50 mg via ORAL
  Filled 2019-03-30: qty 1

## 2019-03-30 MED ORDER — PREDNISONE 20 MG PO TABS
30.0000 mg | ORAL_TABLET | Freq: Every day | ORAL | Status: DC
  Administered 2019-03-30 – 2019-03-31 (×2): 30 mg via ORAL
  Filled 2019-03-30 (×2): qty 1

## 2019-03-30 NOTE — Progress Notes (Signed)
Physical Therapy Treatment Patient Details Name: [name] MRN: [id number] DOB: [birth year] Today's Date: 03/30/2019    History of Present Illness [name] is an 64 y.o. male with hx DM, HTN, chronic pain on daily opioids and hx osteomyelitis of the R humerus at [facility] in 2015 and osteomyelitis of the left foot in 2020 who presents with slowly progressive redness and swelling of the left foot. Now s/p L transmetatarsal amputation; as of 3/14, mobility is compromised by R knee gout flare    PT Comments    Patient progressing with mobility and tolerance with able to perform sit to stand on R leg with cues, but could not tolerate stand step to w/c so still progressed to w/c via lateral scoot.  Able to educate on w/c set up for transfers, etc.  Walker and w/c adjusted to pt.  Educated with handout on having friend to bump him up steps in w/c if needed.  Agitated on not getting gout treatment he feels is appropriate.  Still hopeful to get charity care for HHPT at d/c.    Follow Up Recommendations  Home health PT     Equipment Recommendations  Rolling walker with 5" wheels;Wheelchair (measurements PT);Wheelchair cushion (measurements PT);Other (comment)    Recommendations for Other Services       Precautions / Restrictions Precautions Precautions: Fall Precaution Comments: would vac L LE Restrictions Weight Bearing Restrictions: Yes LLE Weight Bearing: Non weight bearing    Mobility  Bed Mobility   Bed Mobility: Supine to Sit;Sit to Supine     Supine to sit: Supervision Sit to supine: Supervision   General bed mobility comments: assist for safety with wound vac  Transfers Overall transfer level: Needs assistance Equipment used: Rolling walker (2 wheeled) Transfers: Stand Pivot Transfers;Sit to/from Stand;Lateral/Scoot Transfers Sit to Stand: Min guard;From elevated surface Stand pivot transfers: Min assist      Lateral/Scoot Transfers: Min guard General  transfer comment: stood from elevated bed to RW on R LE with cues for L NWB, but unable to hop with RW to get to w/c with min A,  So sat on bed and brought w/c close for pt to perform lateral scoot to and from w/c with cues for w/c set up and A to hold w/c  Ambulation/Gait                 Secretary/administrator Wheelchair mobility: Yes Wheelchair propulsion: Both upper extremities;Right lower extremity Wheelchair parts: Needs assistance Distance: 200', first 84' with bilateral UE's, then last 100' pushing with R LE backwards to work on knee [place] Details (indicate cue type and reason): initially set up w/c with total A, then for transfer back to bed set up with S and cues.  Modified Rankin (Stroke Patients Only)       Balance Overall balance assessment: Needs assistance   Sitting balance-Leahy Scale: Good     Standing balance support: Bilateral upper extremity supported Standing balance-Leahy Scale: Poor Standing balance comment: due to NWB L LE                            Cognition Arousal/Alertness: Awake/alert Behavior During Therapy: WFL for tasks assessed/performed;Agitated Overall Cognitive Status: Within Functional Limits for tasks assessed  General Comments: frustrated about not getting his gout medication early and not getting a longer hose to walk to the bathroom (even though he cannot walk)      Exercises      General Comments General comments (skin integrity, edema, etc.): issued handout on method to have his friend bump him up steps in w/c      Pertinent Vitals/Pain Pain Assessment: Faces Faces Pain Scale: Hurts whole lot Pain Location: R knee with weight bearing Pain Descriptors / Indicators: Grimacing;Aching Pain Intervention(s): Monitored during session;Repositioned;Limited activity within patient's tolerance    Home Living                       Prior Function            PT Goals (current goals can now be found in the care plan section) Progress towards PT goals: Progressing toward goals    Frequency    Min 4X/week      PT Plan Current plan remains appropriate    Co-evaluation              AM-PAC PT "6 Clicks" Mobility   Outcome Measure  Help needed turning from your back to your side while in a flat bed without using bedrails?: None Help needed moving from lying on your back to sitting on the side of a flat bed without using bedrails?: None Help needed moving to and from a bed to a chair (including a wheelchair)?: A Little Help needed standing up from a chair using your arms (e.g., wheelchair or bedside chair)?: A Little Help needed to walk in hospital room?: Total Help needed climbing 3-5 steps with a railing? : Total 6 Click Score: 16    End of Session   Activity Tolerance: Patient limited by pain Patient left: in bed;with call bell/phone within reach Nurse Communication: Patient requests pain meds PT Visit Diagnosis: Other abnormalities of gait and mobility (R26.89);Pain Pain - Right/Left: Right Pain - part of body: Knee     Time: 0920-0952 PT Time Calculation (min) (ACUTE ONLY): 32 min  Charges:  $Therapeutic Activity: 8-22 mins $Wheel Chair Management: 8-22 mins                     Magda Kiel, [place] [facility] [phone] 03/30/2019    Reginia Naas 03/30/2019, 10:45 AM

## 2019-03-30 NOTE — Progress Notes (Addendum)
Progress Note    [name]  WNI:[id number] DOB: Jun 17, 1955  DOA: 03/25/2019 PCP: System, Pcp Not In    Brief Narrative:     Medical records reviewed and are as summarized below:  [name] is an 64 y.o. male with hx DM, HTN, chronic pain on daily opioids and hx osteomyelitis of the R humerus at [facility] in 2015 and osteomyelitis of the left foot in 2020 who presents with slowly progressive redness and swelling of the left foot. Patient is now status post transmetatarsal amputation.  He has a wound VAC in Las[place].  Patient's hemoglobin continues to trend downward which is concerning.   in the wound VAC Canister there is a dark bloody discharge, have asked the nurse to reach out to [name] to verify this is normal.  Will trend H&H.  His stay has also been complicated by gout in his right knee which makes weightbearing difficult as he is nonweightbearing on left   Assessment/Plan:   Principal Problem:   Diabetic foot ulcer ([place]) Active Problems:   DM (diabetes mellitus), type 2, uncontrolled ([place])   Essential hypertension, benign   Osteomyelitis of left foot ([place])   Idiopathic chronic gout of left foot without tophus   Osteomyelitis of the left foot - [facility]: noted that the patient had had an operative debridement for osteomyelitis in Dec 2020 -ID consult appreciated: They recommend ABI and consult with orthopedics for possible TMA -Patient is status post surgery so plan is to DC all antibiotics, appreciate ID consult -Status post transmetatarsal amputation by [name]: Nonweightbearing on the left, wound VAC, follow-up with [name] in 1 week  Renal insufficiency -Stable, monitor closely  Acute blood loss anemia anemia -Continues to trend down -Some expected blood loss post surgery but fluid in canister appears to be darker than I would expect so asked nurse to reach out to [name] to verify this is normal - transfuse for less than  7  Diabetes type 2 with polyneuropathy -Continue home Levemir but will increase dose while on steroids for gout -Sliding scale correction insulin ordered  Hypertension -Titrate medications down as BP low end  Acute gout flare of his right knee -had a long discussion about a trial of steroids to help with gout flare but we will need to monitor his sugars closely and adjust his insulin while on the steroids in an effort to avoid NSAIDs such as indomethacin -Continue colchicine -Unfortunately since patient is nonweightbearing on left the fact that he has gout in his right knee makes it very  difficult for him to ambulate and for him to be discharged safely  obesity Body mass index is 30.56 kg/m.   Family Communication/Anticipated D/C date and plan/Code Status   DVT prophylaxis: hold lovenox for now Code Status: Full Code.  Family Communication: Patient declined phone call to family Disposition Plan: Patient's hemoglobin continues to trend downward.  Concern that he may need a transfusion.  We will also continue to treat patient's gout in his right knee so he will be able to do transfers safer at home since he is nonweightbearing on the left  Medical Consultants:    ID  [name]     Subjective:  Steroids helped his knee but   Objective:    Vitals:   03/29/19 1217 03/29/19 2337 03/30/19 0544 03/30/19 1305  BP: 127/73 107/65 115/64 95/62  Pulse: 92 67 78 65  Resp: 18 17 16 16   Temp: 99.6 F (  37.6 C) 99.1 F (37.3 C) 97.7 F (36.5 C) 98.3 F (36.8 C)  TempSrc: Oral Oral Oral Oral  SpO2: 97% 100% 100% 93%  Weight:      Height:        Intake/Output Summary (Last 24 hours) at 03/30/2019 1511 Last data filed at 03/30/2019 1149 Gross per 24 hour  Intake 358 ml  Output 800 ml  Net -442 ml   Filed Weights   03/26/19 0800  Weight: 108 kg    Exam: In bed, no acute distress Left foot with wound VAC Right knee swollen and hot to touch   Data Reviewed:   I  have personally reviewed following labs and imaging studies:  Labs: Labs show the following:   Basic Metabolic Panel: Recent Labs  Lab 03/25/19 1021 03/25/19 1021 03/26/19 0633 03/26/19 0633 03/27/19 0427 03/29/19 0510  NA 135  --  139  --  138 134*  K 5.4*   < > 4.3   < > 4.7 4.3  CL 102  --  102  --  103 101  CO2 21*  --  24  --  25 23  GLUCOSE 180*  --  198*  --  167* 210*  BUN 29*  --  21  --  20 23  CREATININE 1.30*  --  1.16  --  1.24 1.56*  CALCIUM 10.2  --  9.7  --  9.6 8.8*   < > = values in this interval not displayed.   GFR Estimated Creatinine Clearance: 65.1 mL/min (A) (by C-G formula based on SCr of 1.56 mg/dL (H)). Liver Function Tests: Recent Labs  Lab 03/25/19 1021  AST 15  ALT 11  ALKPHOS 70  BILITOT 0.6  PROT 8.0  ALBUMIN 3.7   No results for input(s): LIPASE, AMYLASE in the last 168 hours. No results for input(s): AMMONIA in the last 168 hours. Coagulation profile Recent Labs  Lab 03/25/19 1021  INR 1.1    CBC: Recent Labs  Lab 03/25/19 1021 03/26/19 0633 03/27/19 0427 03/29/19 0510 03/30/19 0448  WBC 7.6 5.3 4.9 6.8 6.1  NEUTROABS 5.4  --   --   --   --   HGB 10.2* 9.4* 9.4* 7.7* 7.3*  HCT 29.8* 27.5* 27.8* 22.7* 21.5*  MCV 92.0 91.4 91.4 90.4 90.7  PLT 188 165 143* 141* 152   Cardiac Enzymes: No results for input(s): CKTOTAL, CKMB, CKMBINDEX, TROPONINI in the last 168 hours. BNP (last 3 results) No results for input(s): PROBNP in the last 8760 hours. CBG: Recent Labs  Lab 03/29/19 1106 03/29/19 1606 03/29/19 2110 03/30/19 0613 03/30/19 1045  GLUCAP 193* 184* 260* 241* 191*   D-Dimer: No results for input(s): DDIMER in the last 72 hours. Hgb A1c: No results for input(s): HGBA1C in the last 72 hours. Lipid Profile: No results for input(s): CHOL, HDL, LDLCALC, TRIG, CHOLHDL, LDLDIRECT in the last 72 hours. Thyroid function studies: No results for input(s): TSH, T4TOTAL, T3FREE, THYROIDAB in the last 72  hours.  Invalid input(s): FREET3 Anemia work up: No results for input(s): VITAMINB12, FOLATE, FERRITIN, TIBC, IRON, RETICCTPCT in the last 72 hours. Sepsis Labs: Recent Labs  Lab 03/25/19 1021 03/25/19 1021 03/25/19 1223 03/26/19 0633 03/27/19 0427 03/29/19 0510 03/30/19 0448  WBC 7.6   < >  --  5.3 4.9 6.8 6.1  LATICACIDVEN 1.4  --  0.9  --   --   --   --    < > = values in this interval  not displayed.    Microbiology Recent Results (from the past 240 hour(s))  Culture, blood (Routine x 2)     Status: None   Collection Time: 03/25/19 10:20 AM   Specimen: BLOOD RIGHT HAND  Result Value Ref Range Status   Specimen Description BLOOD RIGHT HAND  Final   Special Requests   Final    BOTTLES DRAWN AEROBIC AND ANAEROBIC Blood Culture adequate volume   Culture   Final    NO GROWTH 5 DAYS Performed at [facility], [address], [place], [place] [address]    Report Status 03/30/2019 FINAL  Final  SARS CORONAVIRUS 2 (TAT 6-24 HRS) Nasopharyngeal Nasopharyngeal Swab     Status: None   Collection Time: 03/25/19 12:51 PM   Specimen: Nasopharyngeal Swab  Result Value Ref Range Status   SARS Coronavirus 2 NEGATIVE NEGATIVE Final    Comment: (NOTE) SARS-CoV-2 target nucleic acids are NOT DETECTED. The SARS-CoV-2 RNA is generally detectable in upper and lower respiratory specimens during the acute phase of infection. Negative results do not preclude SARS-CoV-2 infection, do not rule out co-infections with other pathogens, and should not be used as the sole basis for treatment or other patient management decisions. Negative results must be combined with clinical observations, patient history, and epidemiological information. The expected result is Negative. Fact Sheet for Patients: SugarRoll.be Fact Sheet for Healthcare Providers: https://www.woods-mathews.com/ This test is not yet approved or cleared by the [place] FDA and   has been authorized for detection and/or diagnosis of SARS-CoV-2 by FDA under an Emergency Use Authorization (EUA). This EUA will remain  in effect (meaning this test can be used) for the duration of the COVID-19 declaration under Section 56 4(b)(1) of the Act, 21 U.S.C. section 360bbb-3(b)(1), unless the authorization is terminated or revoked sooner. Performed at [facility], [place] [address] [place], [place] [address]   Culture, blood (Routine x 2)     Status: None   Collection Time: 03/25/19  1:01 PM   Specimen: BLOOD  Result Value Ref Range Status   Specimen Description BLOOD LEFT ANTECUBITAL  Final   Special Requests   Final    BOTTLES DRAWN AEROBIC AND ANAEROBIC Blood Culture results may not be optimal due to an excessive volume of blood received in culture bottles   Culture   Final    NO GROWTH 5 DAYS Performed at [facility], [place] [address] [place], [place] [address]    Report Status 03/30/2019 FINAL  Final  MRSA PCR Screening     Status: None   Collection Time: 03/25/19  7:54 PM   Specimen: Nasal Mucosa; Nasopharyngeal  Result Value Ref Range Status   MRSA by PCR NEGATIVE NEGATIVE Final    Comment:        The GeneXpert MRSA Assay (FDA approved for NASAL specimens only), is one component of a comprehensive MRSA colonization surveillance program. It is not intended to diagnose MRSA infection nor to guide or monitor treatment for MRSA infections. Performed at [facility], [place] [address], [place], [place] [address]   Aerobic/Anaerobic Culture (surgical/deep wound)     Status: None (Preliminary result)   Collection Time: 03/27/19  8:11 AM   Specimen: Soft Tissue, Other  Result Value Ref Range Status   Specimen Description TISSUE  Final   Special Requests LEFT FOOT  Final   Gram Stain   Final    RARE WBC PRESENT,BOTH PMN AND MONONUCLEAR NO ORGANISMS SEEN  Culture   Final    MODERATE ENTEROBACTER AEROGENES NO ANAEROBES ISOLATED; CULTURE IN  PROGRESS FOR 5 DAYS CRITICAL RESULT CALLED TO, READ BACK BY AND VERIFIED WITH: RN A ABU [id number] AT 4 BY CM Performed at [facility], [place] [address], [place], [place] [address]    Report Status PENDING  Incomplete   Organism ID, Bacteria ENTEROBACTER AEROGENES  Final      Susceptibility   Enterobacter aerogenes - MIC*    CEFAZOLIN >=64 RESISTANT Resistant     CEFEPIME 0.5 SENSITIVE Sensitive     CEFTAZIDIME <=1 SENSITIVE Sensitive     CEFTRIAXONE >=64 RESISTANT Resistant     CIPROFLOXACIN <=0.25 SENSITIVE Sensitive     GENTAMICIN <=1 SENSITIVE Sensitive     IMIPENEM 0.5 SENSITIVE Sensitive     TRIMETH/SULFA >=320 RESISTANT Resistant     PIP/TAZO 8 SENSITIVE Sensitive     * MODERATE ENTEROBACTER AEROGENES    Procedures and diagnostic studies:  No results found.  Medications:   . allopurinol  300 mg Oral Daily  . amitriptyline  25 mg Oral QHS  . aspirin EC  81 mg Oral Daily  . colchicine  0.3 mg Oral Daily  . docusate sodium  100 mg Oral BID  . gabapentin  300 mg Oral TID  . insulin aspart  0-20 Units Subcutaneous TID WC  . insulin aspart  0-5 Units Subcutaneous QHS  . insulin detemir  50 Units Subcutaneous QHS  . metoprolol succinate  100 mg Oral Daily  . mupirocin ointment  1 application Nasal BID  . predniSONE  30 mg Oral Q breakfast   Continuous Infusions: . sodium chloride 10 mL/hr at 03/27/19 0934  . methocarbamol (ROBAXIN) IV       LOS: 4 days   Geradine Girt  [facility]   How to contact the [facility] Attending or Consulting provider [place] or covering provider during after hours [place], for this patient?  1. Check the care team in [facility] and look for a) attending/consulting TRH provider listed and b) the [facility] team listed 2. Log into [email or web address] and use [place]'s universal password to access. If you do not have the password, please contact the hospital operator. 3. Locate the [facility] provider you are looking for under [facility] and page to  a number that you can be directly reached. 4. If you still have difficulty reaching the provider, please page the [facility] (Director on Call) for the Hospitalists listed on amion for assistance.  03/30/2019, 3:11 PM

## 2019-03-30 NOTE — Telephone Encounter (Signed)
Provider called regarding issue with wound vac. Call sent to triage.

## 2019-03-31 ENCOUNTER — Encounter (HOSPITAL_BASED_OUTPATIENT_CLINIC_OR_DEPARTMENT_OTHER): Payer: Medicaid Other | Admitting: Physician Assistant

## 2019-03-31 DIAGNOSIS — N179 Acute kidney failure, unspecified: Secondary | ICD-10-CM

## 2019-03-31 LAB — GLUCOSE, CAPILLARY
Glucose-Capillary: 131 mg/dL — ABNORMAL HIGH (ref 70–99)
Glucose-Capillary: 183 mg/dL — ABNORMAL HIGH (ref 70–99)

## 2019-03-31 LAB — CBC
HCT: 21.6 % — ABNORMAL LOW (ref 39.0–52.0)
Hemoglobin: 7.4 g/dL — ABNORMAL LOW (ref 13.0–17.0)
MCH: 31.2 pg (ref 26.0–34.0)
MCHC: 34.3 g/dL (ref 30.0–36.0)
MCV: 91.1 fL (ref 80.0–100.0)
Platelets: 183 10*3/uL (ref 150–400)
RBC: 2.37 MIL/uL — ABNORMAL LOW (ref 4.22–5.81)
RDW: 16 % — ABNORMAL HIGH (ref 11.5–15.5)
WBC: 5.9 10*3/uL (ref 4.0–10.5)
nRBC: 0 % (ref 0.0–0.2)

## 2019-03-31 MED ORDER — PREDNISONE 10 MG PO TABS: 30.0000 mg | ORAL_TABLET | Freq: Every day | ORAL | 0 refills | Status: DC

## 2019-03-31 NOTE — Progress Notes (Addendum)
Pt upset, states provider told him he was suppose to get mg of dilaudid. Pt educated and told what his med order was. Pt states mg of dilaudid does not help with his pain. Pt appears comfortable, states PO pain medication is more effective but still wants the dilaudid.

## 2019-03-31 NOTE — Progress Notes (Signed)
Ala, RN went over discharge insrtuctions with the patient and he stated understanding but was disengaged with the RN. Prentiss Bells, RN placed pt on prevena [facility] prior to discharge and showed him the instruction book in the box in case it stated alarming for any reason.

## 2019-03-31 NOTE — Progress Notes (Signed)
Physical Therapy Treatment Patient Details Name: [name] MRN: [id number] DOB: 1955-06-22 Today's Date: 03/31/2019    History of Present Illness [name] is an 64 y.o. male with hx DM, HTN, chronic pain on daily opioids and hx osteomyelitis of the R humerus at [facility] in 2015 and osteomyelitis of the left foot in 2020 who presents with slowly progressive redness and swelling of the left foot. Now s/p L transmetatarsal amputation; as of 3/14, mobility is compromised by R knee gout flare    PT Comments    Pt very eager for d/c, so was pretty impatient with PT. PT explained importance of session for safe d/c, and pt agreeable after explaining this. Pt ambulated short distance in room with good maintenance of NWB LLE, pt with general unsteadiness so encouraged to have supervision from son at home. PT showed pt how to operate wheelchair once more, especially moving and removing leg rests for safety during transfer into and out of w/c. Pt declines transfer into and out of w/c. PT continuing to recommend HHPT, plan is for pt to d/c today.    Follow Up Recommendations  Home health PT     Equipment Recommendations  Rolling walker with 5" wheels;Wheelchair (measurements PT);Wheelchair cushion (measurements PT);Other (comment)    Recommendations for Other Services       Precautions / Restrictions Precautions Precautions: Fall Precaution Comments: would vac L LE Restrictions Weight Bearing Restrictions: Yes LLE Weight Bearing: Non weight bearing    Mobility  Bed Mobility Overal bed mobility: Modified Independent Bed Mobility: Supine to Sit     Supine to sit: Modified independent (Device/Increase time)     General bed mobility comments: MOd I for increased time and use of bedrails.  Transfers Overall transfer level: Needs assistance Equipment used: Rolling walker (2 wheeled) Transfers: Sit to/from Stand Sit to Stand: Min guard;From elevated surface         General  transfer comment: Min guard for safety, pt with high bed at home so PT raised bed height to mimic this. Pt with good adherence to NWB LLE.  Ambulation/Gait Ambulation/Gait assistance: Min guard;Min assist Gait Distance (Feet): 15 Feet Assistive device: Rolling walker (2 wheeled) Gait Pattern/deviations: Step-to pattern;Decreased stride length;Trunk flexed Gait velocity: decr   General Gait Details: Min guard for safety, occasional min assist to steady and verbal cuing for slowing down and steadying self. Pt moving quickly because he is eager to d/c.   Stairs             Wheelchair Mobility    Modified Rankin (Stroke Patients Only)       Balance Overall balance assessment: Needs assistance   Sitting balance-Leahy Scale: Good     Standing balance support: Bilateral upper extremity supported Standing balance-Leahy Scale: Poor Standing balance comment: reliant on RW for steadying, occasional PT assist                            Cognition Arousal/Alertness: Awake/alert Behavior During Therapy: WFL for tasks assessed/performed;Agitated Overall Cognitive Status: Within Functional Limits for tasks assessed                                 General Comments: Pt is very short with PT, very limited patience with PT explanations      Exercises      General Comments        Pertinent Vitals/Pain  Pain Assessment: Faces Faces Pain Scale: Hurts little more Pain Location: LLE, in dependent position Pain Descriptors / Indicators: Grimacing;Guarding Pain Intervention(s): Limited activity within patient's tolerance;Monitored during session;Repositioned    Home Living                      Prior Function            PT Goals (current goals can now be found in the care plan section) Acute Rehab PT Goals Patient Stated Goal: wants to get home PT Goal Formulation: With patient Time For Goal Achievement: 04/11/19 Potential to Achieve Goals:  Good Progress towards PT goals: Progressing toward goals    Frequency    Min 4X/week      PT Plan Current plan remains appropriate    Co-evaluation              AM-PAC PT "6 Clicks" Mobility   Outcome Measure  Help needed turning from your back to your side while in a flat bed without using bedrails?: None Help needed moving from lying on your back to sitting on the side of a flat bed without using bedrails?: A Little Help needed moving to and from a bed to a chair (including a wheelchair)?: A Little Help needed standing up from a chair using your arms (e.g., wheelchair or bedside chair)?: A Little Help needed to walk in hospital room?: A Little Help needed climbing 3-5 steps with a railing? : A Lot 6 Click Score: 18    End of Session Equipment Utilized During Treatment: Gait belt Activity Tolerance: Patient limited by pain Patient left: in bed;with call bell/phone within reach Nurse Communication: Patient requests pain meds PT Visit Diagnosis: Other abnormalities of gait and mobility (R26.89);Pain Pain - Right/Left: Right Pain - part of body: Knee     Time: 1251-1259 PT Time Calculation (min) (ACUTE ONLY): 8 min  Charges:  $Therapeutic Activity: 8-22 mins                    Emagene Merfeld E, PT Acute Rehabilitation Services Pager [phone]  Office [phone]   Zakaria Sedor D Elonda Husky 03/31/2019, 1:33 PM

## 2019-03-31 NOTE — Progress Notes (Signed)
HHC arranged by Nira Conn RNCM with [facility] ( [facility]). Butch Penny with [facility] called and is aware of discharge home today; [place] Supervisor [phone]

## 2019-03-31 NOTE — Discharge Summary (Signed)
Physician Discharge Summary  [place]stone XQJ:[id number] DOB: Dec 29, 1955 DOA: 03/25/2019  PCP: [facility] [place] date: 03/25/2019 Discharge date: 03/31/2019  Recommendations for Outpatient Follow-up:   Left foot diabetic ulcer with osteomyelitis, gangrene.  -- Continue wound VAC.  Ambulate without weightbearing on left foot.  Follow-up with [name] in 1 week.  Acute blood loss anemia superimposed on anemia of chronic disease most likely --Hemoglobin stable last 48 hours.  Follow-up as an outpatient with repeat CBC.  AKI versus CKD stage IIIa --Close follow-up on discharge recommended.   Follow-up Information    [name], MD In 1 week.   Specialty: Orthopedic Surgery Contact information: [address] [place] [address] [phone]            Discharge Diagnoses: Principal diagnosis is #1 1. Left foot diabetic ulcer with osteomyelitis, gangrene 2. Acute blood loss anemia superimposed on anemia of chronic disease most likely 3. AKI versus CKD stage IIIa 4. Diabetes mellitus type 2 on long-acting insulin, peripheral neuropathy. Hemoglobin A1c 7.2. 5. Acute gout flare right knee. 6. Essential hypertension 7. Chronic pain on opioids  Discharge Condition: improved Disposition: Home with home health PT, RN, rolling walker with 5 inch wheels, wheelchair with cushion, drop arm bedside commode.  Son and daughter will stay with the patient.  Diet recommendation: heart healthy, diabetic diet  Filed Weights   03/26/19 0800  Weight: 108 kg    History of present illness:  64 year old man PMH including diabetes mellitus type 2 on insulin, chronic pain on opioids, osteomyelitis of the right humerus 2015, osteomyelitis of the left foot 2020, treated with rounds of IV antibiotics and oral antibiotics, followed by wound care and undergoing hyperbaric therapy, presented to the emergency department for increasing swelling of the left lower  extremity and discolored station of the foot.  Venous ultrasound was negative for DVT.  X-ray showed changes left first through third MTP joints consistent with septic arthritis and osteomyelitis.  Admitted for osteomyelitis of the left foot.  Hospital Course:  Patient was seen by orthopedics and infectious disease with recommendation for surgery and antibiotics.  Surgery was delayed secondary to patient being noncompliant with n.p.o. status.  Underwent transmetatarsal amputation left foot with application of wound VAC.  Developed acute gout flare treated with colchicine and prednisone, this was in the right knee which complicated his discharge given his inability to ambulate on the left leg.  At this point gout pain appears resolved in the right knee and is ambulating on the leg without difficulty.  Left foot diabetic ulcer with osteomyelitis, gangrene.  Presumably polymicrobial.  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 --Followed by infectious disease, culture grew Enterobacter, significance was unclear given the chronic nature of the ulcer and, infectious disease recommends stopping antibiotics.   -- Continue wound VAC.  Ambulate without weightbearing on left foot.  Follow-up with [name] in 1 week.  Acute blood loss anemia superimposed on anemia of chronic disease most likely --Hemoglobin stable last 48 hours.  Follow-up as an outpatient with repeat CBC.  AKI versus CKD stage IIIa --Close follow-up on discharge recommended.  Diabetes mellitus type 2 on long-acting insulin, peripheral neuropathy.  Hemoglobin A1c 7.2. --Continue Levemir, gabapentin, amitriptyline, lisinopril.  Patient reports he is on 70 units of Levemir twice daily at home, this is substantially higher than what he has been given here.  Expect he will do well with a couple more days of steroids.  Patient titrates his insulin based  on his blood sugar. --CBG stable  Acute gout flare right knee. --Appears  resolved.  2 more days of prednisone.  Essential hypertension --Continue lisinopril, metoprolol, amlodipine, furosemide  Chronic pain on opioids --Continue chronic narcotics  Significant Hospital Events   . 3/11 admitted for osteomyelitis left foot . 3/11 orthopedics consultation . 3/11 infectious disease consultation . 3/13 transmetatarsal amputation, application of wound VAC   Consults:  . Orthopedics . Infectious disease   Procedures:  . 3/13 transmetatarsal amputation, application of wound VAC  Significant Diagnostic Tests:  . 3/11 left foot film severe arthropathy first through third MTP joints new from 2020.  Suggests chronic septic arthritis and osteomyelitis . 3/11 left lower extremity Doppler negative . 3/11 ABI : Medial arterial calcification suggested bilaterally.   Micro Data:  . Wound culture moderate Enterobacter aerogenes   Antimicrobials:  .    Today's assessment: S: Feels better today.  Right knee pain essentially gone.  Ambulating without difficulty on the right leg.  Ready to go home.  Reports he was just told last night that his son died 04-11-2022. O: Vitals:  Vitals:   03/31/19 0613 03/31/19 1156  BP: 105/88 137/79  Pulse: 64 79  Resp: 18 18  Temp: 97.7 F (36.5 C) 98.3 F (36.8 C)  SpO2: 100% 97%    Constitutional:  . Appears calm and comfortable Respiratory:  . CTA bilaterally, no w/r/r.  . Respiratory effort normal. Cardiovascular:  . RRR, no m/r/g . No LE extremity edema   Skin:  . Left foot with chronic skin changes, wound VAC in place. . Right knee appears grossly unremarkable.  No erythema noted.  Musculoskeletal.  Bends and extends right knee without apparent pain. Psychiatric:  . Mental status o Mood, affect appropriate  Hemoglobin stable 7.4.  Discharge Instructions  Discharge Instructions    Diet - low sodium heart healthy   Complete by: As directed    Diet Carb Modified   Complete by: As directed    Discharge  instructions   Complete by: As directed    Call your physician or seek immediate medical attention for left foot swelling, pain, bleeding or worsening of condition. Do not put any weight on left leg.   Increase activity slowly   Complete by: As directed      Allergies as of 03/31/2019      Reactions   Other Hives   Anti-snake venim for copperhead venom.   Pravastatin Other (See Comments)   Leg cramps, elevated CK   Prednisone Other (See Comments)   Patient states "it runs my blood sugar up so I don't take it."   Toradol [ketorolac Tromethamine] Hives, Nausea And Vomiting   Pill form only      Medication List    STOP taking these medications   hydrochlorothiazide 25 MG tablet Commonly known as: HYDRODIURIL     TAKE these medications   allopurinol 300 MG tablet Commonly known as: ZYLOPRIM Take 300 mg by mouth daily.   amitriptyline 25 MG tablet Commonly known as: ELAVIL Take 25 mg by mouth at bedtime.   amLODipine 10 MG tablet Commonly known as: NORVASC Take 5 mg by mouth daily.   aspirin EC 81 MG tablet Take 81 mg by mouth daily.   furosemide 20 MG tablet Commonly known as: LASIX Take 20 mg by mouth daily.   gabapentin 300 MG capsule Commonly known as: NEURONTIN Take 300 mg by mouth 3 (three) times daily.   Levemir FlexTouch 100 UNIT/ML FlexPen  Generic drug: insulin detemir Inject 70 Units into the skin 2 (two) times daily.   lisinopril 40 MG tablet Commonly known as: ZESTRIL Take 40 mg by mouth daily.   metoprolol succinate 100 MG 24 hr tablet Commonly known as: TOPROL-XL Take 100 mg by mouth daily.   Oxycodone HCl 10 MG Tabs Take 10 mg by mouth every 6 (six) hours.   predniSONE 10 MG tablet Commonly known as: DELTASONE Take 3 tablets (30 mg total) by mouth daily with breakfast. Start taking on: April 01, 2019            Durable Medical Equipment  (From admission, onward)         Start     Ordered   03/29/19 1321  For home use only DME 3  n 1  Once    Comments: Drop arm   03/29/19 1321   03/29/19 1321  For home use only DME Walker rolling  Once    Question Answer Comment  Walker: With [place]   Patient needs a walker to treat with the following condition Weakness      03/29/19 1321   03/29/19 1320  For home use only DME lightweight manual wheelchair with seat cushion  Once    Comments: Patient suffers from  TRANSMETATARSAL AMPUTATION left foot which impairs their ability to perform daily activities like ambulating  in the home.  A cane will not resolve  issue with performing activities of daily living. A wheelchair will allow patient to safely perform daily activities. Patient is not able to propel themselves in the home using a standard weight wheelchair due to TRANSMETATARSAL AMPUTATION left foot. Patient can self propel in the lightweight wheelchair. Length of need life time . Accessories: elevating leg rests (ELRs), wheel locks, extensions and anti-tippers.  Seat and back cushions   03/29/19 1320         Allergies  Allergen Reactions  . Other Hives    Anti-snake venim for copperhead venom.  . Pravastatin Other (See Comments)    Leg cramps, elevated CK  . Prednisone Other (See Comments)    Patient states "it runs my blood sugar up so I don't take it."  . Toradol [Ketorolac Tromethamine] Hives and Nausea And Vomiting    Pill form only    The results of significant diagnostics from this hospitalization (including imaging, microbiology, ancillary and laboratory) are listed below for reference.    Significant Diagnostic Studies: DG Foot Complete Left  Result Date: 03/25/2019 CLINICAL DATA:  Possible foot infection.  Diabetic ulcer EXAM: LEFT FOOT - COMPLETE 3+ VIEW COMPARISON:  03/29/2017 FINDINGS: The first through third MTP joints are widened with sclerosis, spurring, and deformity of the adjacent bone. This is new from prior and new from a report at [facility] in 2020. The regional soft tissues are  thickened. No opaque foreign body or fracture. Diffuse arterial calcification with osteopenia. IMPRESSION: Severe arthropathy of the first through third MTP joints that is new from 2020. 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 Electronically Signed   By: Monte Fantasia M.D.   On: 03/25/2019 10:52   VAS [place] ABI WITH/WO TBI  Result Date: 03/27/2019 LOWER EXTREMITY DOPPLER STUDY Indications: Gangrene.  Limitations: Today's exam was limited due to large toe circumference. Comparison Study: No prior study Performing Technologist: Maudry Mayhew MHA, RVT, RDCS, RDMS  Examination Guidelines: A complete evaluation includes at minimum, Doppler waveform signals and systolic blood  pressure reading at the level of bilateral brachial, anterior tibial, and posterior tibial arteries, when vessel segments are accessible. Bilateral testing is considered an integral part of a complete examination. Photoelectric Plethysmograph (PPG) waveforms and toe systolic pressure readings are included as required and additional duplex testing as needed. Limited examinations for reoccurring indications may be performed as noted.  ABI Findings: +---------+------------------+-----+---------+--------+ Right    Rt Pressure (mmHg)IndexWaveform Comment  +---------+------------------+-----+---------+--------+ Brachial 125                    triphasic         +---------+------------------+-----+---------+--------+ PTA      204               1.63 triphasic         +---------+------------------+-----+---------+--------+ DP       158               1.26 triphasic         +---------+------------------+-----+---------+--------+ Great Toe                       Present           +---------+------------------+-----+---------+--------+ +---------+------------------+-----+----------+-------+ Left     Lt Pressure (mmHg)IndexWaveform   Comment +---------+------------------+-----+----------+-------+ Brachial 123                    triphasic         +---------+------------------+-----+----------+-------+ PTA      145               1.16 monophasic        +---------+------------------+-----+----------+-------+ DP       182               1.46 triphasic         +---------+------------------+-----+----------+-------+ Great Toe                       Present           +---------+------------------+-----+----------+-------+ +-------+-----------+-----------+------------+------------+ ABI/TBIToday's ABIToday's TBIPrevious ABIPrevious TBI +-------+-----------+-----------+------------+------------+ Right  1.63                                           +-------+-----------+-----------+------------+------------+ Left   1.46                                           +-------+-----------+-----------+------------+------------+ Unable to obtain toe pressures due to great toe being too large for cuff.  Summary: Right: Resting right ankle-brachial index is elevated suggestive of medial arterial calcification. Left: Resting left ankle-brachial index is elevated suggestive of medial arterial calcification.  *See table(s) above for measurements and observations.  Electronically signed by Harold Barban MD on 03/27/2019 at 7:06:28 PM.   Final    VAS [place] LOWER EXTREMITY VENOUS (DVT) (ONLY [facility] & [facility])  Result Date: 03/25/2019  Lower Venous DVTStudy Indications: Diabetic foot ulcer, and Swelling.  Limitations: Edema. Comparison Study: No prior study on file Performing Technologist: Sharion Dove RVS  Examination Guidelines: A complete evaluation includes B-mode imaging, spectral Doppler, color Doppler, and power Doppler as needed of all accessible portions of each vessel. Bilateral testing is considered an integral part of a complete examination. Limited examinations for reoccurring indications may be performed as noted.  The reflux  portion of the exam is performed with the patient in reverse Trendelenburg.  +-----+---------------+---------+-----------+----------+--------------+ RIGHTCompressibilityPhasicitySpontaneityPropertiesThrombus Aging +-----+---------------+---------+-----------+----------+--------------+ CFV  Full           Yes      Yes                                 +-----+---------------+---------+-----------+----------+--------------+   +---------+---------------+---------+-----------+----------+--------------+ LEFT     CompressibilityPhasicitySpontaneityPropertiesThrombus Aging +---------+---------------+---------+-----------+----------+--------------+ CFV      Full           Yes      Yes                                 +---------+---------------+---------+-----------+----------+--------------+ SFJ      Full                                                        +---------+---------------+---------+-----------+----------+--------------+ FV Prox  Full                                                        +---------+---------------+---------+-----------+----------+--------------+ FV Mid   Full                                                        +---------+---------------+---------+-----------+----------+--------------+ FV DistalFull                                                        +---------+---------------+---------+-----------+----------+--------------+ PFV      Full                                                        +---------+---------------+---------+-----------+----------+--------------+ POP      Full           Yes      Yes                                 +---------+---------------+---------+-----------+----------+--------------+ PTV      Full                                                        +---------+---------------+---------+-----------+----------+--------------+ PERO  Not  visualized +---------+---------------+---------+-----------+----------+--------------+     *See table(s) above for measurements and observations. Electronically signed by Monica Martinez MD on 03/25/2019 at 4:33:22 PM.    Final     Microbiology: Recent Results (from the past 240 hour(s))  Culture, blood (Routine x 2)     Status: None   Collection Time: 03/25/19 10:20 AM   Specimen: BLOOD RIGHT HAND  Result Value Ref Range Status   Specimen Description BLOOD RIGHT HAND  Final   Special Requests   Final    BOTTLES DRAWN AEROBIC AND ANAEROBIC Blood Culture adequate volume   Culture   Final    NO GROWTH 5 DAYS Performed at [facility], [address], [place], [place] [address]    Report Status 03/30/2019 FINAL  Final  SARS CORONAVIRUS 2 (TAT 6-24 HRS) Nasopharyngeal Nasopharyngeal Swab     Status: None   Collection Time: 03/25/19 12:51 PM   Specimen: Nasopharyngeal Swab  Result Value Ref Range Status   SARS Coronavirus 2 NEGATIVE NEGATIVE Final    Comment: (NOTE) SARS-CoV-2 target nucleic acids are NOT DETECTED. The SARS-CoV-2 RNA is generally detectable in upper and lower respiratory specimens during the acute phase of infection. Negative results do not preclude SARS-CoV-2 infection, do not rule out co-infections with other pathogens, and should not be used as the sole basis for treatment or other patient management decisions. Negative results must be combined with clinical observations, patient history, and epidemiological information. The expected result is Negative. Fact Sheet for Patients: SugarRoll.be Fact Sheet for Healthcare Providers: https://www.woods-mathews.com/ This test is not yet approved or cleared by the [place] FDA and  has been authorized for detection and/or diagnosis of SARS-CoV-2 by FDA under an Emergency Use Authorization (EUA). This EUA will remain  in effect (meaning this test can be used) for the  duration of the COVID-19 declaration under Section 56 4(b)(1) of the Act, 21 U.S.C. section 360bbb-3(b)(1), unless the authorization is terminated or revoked sooner. Performed at [facility], [place] [address], [place], [place] [address]   Culture, blood (Routine x 2)     Status: None   Collection Time: 03/25/19  1:01 PM   Specimen: BLOOD  Result Value Ref Range Status   Specimen Description BLOOD LEFT ANTECUBITAL  Final   Special Requests   Final    BOTTLES DRAWN AEROBIC AND ANAEROBIC Blood Culture results may not be optimal due to an excessive volume of blood received in culture bottles   Culture   Final    NO GROWTH 5 DAYS Performed at [facility], [place] [address] [place], [place] [address]    Report Status 03/30/2019 FINAL  Final  MRSA PCR Screening     Status: None   Collection Time: 03/25/19  7:54 PM   Specimen: Nasal Mucosa; Nasopharyngeal  Result Value Ref Range Status   MRSA by PCR NEGATIVE NEGATIVE Final    Comment:        The GeneXpert MRSA Assay (FDA approved for NASAL specimens only), is one component of a comprehensive MRSA colonization surveillance program. It is not intended to diagnose MRSA infection nor to guide or monitor treatment for MRSA infections. Performed at [facility], [place] [address] [place], [place] [address]   Aerobic/Anaerobic Culture (surgical/deep wound)     Status: None (Preliminary result)   Collection Time: 03/27/19  8:11 AM   Specimen: Soft Tissue, Other  Result Value Ref Range Status   Specimen Description TISSUE  Final   Special Requests LEFT FOOT  Final   Gram Stain   Final    RARE WBC PRESENT,BOTH PMN AND MONONUCLEAR NO ORGANISMS SEEN    Culture   Final    MODERATE ENTEROBACTER AEROGENES NO ANAEROBES ISOLATED; CULTURE IN PROGRESS FOR 5 DAYS CRITICAL RESULT CALLED TO, READ BACK BY AND VERIFIED WITH: RN A ABU [id number] AT 70 BY CM Performed at [facility], [place] [address], [place], [place] [address]     Report Status PENDING  Incomplete   Organism ID, Bacteria ENTEROBACTER AEROGENES  Final      Susceptibility   Enterobacter aerogenes - MIC*    CEFAZOLIN >=64 RESISTANT Resistant     CEFEPIME 0.5 SENSITIVE Sensitive     CEFTAZIDIME <=1 SENSITIVE Sensitive     CEFTRIAXONE >=64 RESISTANT Resistant     CIPROFLOXACIN <=0.25 SENSITIVE Sensitive     GENTAMICIN <=1 SENSITIVE Sensitive     IMIPENEM 0.5 SENSITIVE Sensitive     TRIMETH/SULFA >=320 RESISTANT Resistant     PIP/TAZO 8 SENSITIVE Sensitive     * MODERATE ENTEROBACTER AEROGENES     Labs: Basic Metabolic Panel: Recent Labs  Lab 03/25/19 1021 03/26/19 0633 03/27/19 0427 03/29/19 0510  NA 135 139 138 134*  K 5.4* 4.3 4.7 4.3  CL 102 102 103 101  CO2 21* 24 25 23   GLUCOSE 180* 198* 167* 210*  BUN 29* 21 20 23   CREATININE 1.30* 1.16 1.24 1.56*  CALCIUM 10.2 9.7 9.6 8.8*   Liver Function Tests: Recent Labs  Lab 03/25/19 1021  AST 15  ALT 11  ALKPHOS 70  BILITOT 0.6  PROT 8.0  ALBUMIN 3.7   CBC: Recent Labs  Lab 03/25/19 1021 03/25/19 1021 03/26/19 0633 03/26/19 0633 03/27/19 0427 03/29/19 0510 03/30/19 0448 03/30/19 1511 03/31/19 0312  WBC 7.6   < > 5.3  --  4.9 6.8 6.1  --  5.9  NEUTROABS 5.4  --   --   --   --   --   --   --   --   HGB 10.2*   < > 9.4*   < > 9.4* 7.7* 7.3* 7.6* 7.4*  HCT 29.8*   < > 27.5*   < > 27.8* 22.7* 21.5* 21.8* 21.6*  MCV 92.0   < > 91.4  --  91.4 90.4 90.7  --  91.1  PLT 188   < > 165  --  143* 141* 152  --  183   < > = values in this interval not displayed.    CBG: Recent Labs  Lab 03/30/19 1045 03/30/19 1629 03/30/19 2123 03/31/19 0611 03/31/19 1102  GLUCAP 191* 133* 263* 131* 183*    Principal Problem:   Diabetic foot ulcer (HCC) Active Problems:   DM (diabetes mellitus), type 2, uncontrolled ([place])   Essential hypertension, benign   Osteomyelitis of left foot (HCC)   Idiopathic chronic gout of left foot without tophus   Time coordinating discharge: 35  minutes  Signed:  Murray Hodgkins, MD  [facility]  03/31/2019, 12:35 PM

## 2019-04-01 ENCOUNTER — Ambulatory Visit (INDEPENDENT_AMBULATORY_CARE_PROVIDER_SITE_OTHER): Payer: Medicaid Other | Admitting: Physician Assistant

## 2019-04-01 ENCOUNTER — Other Ambulatory Visit: Payer: Self-pay

## 2019-04-01 ENCOUNTER — Encounter: Payer: Self-pay | Admitting: Physician Assistant

## 2019-04-01 ENCOUNTER — Encounter (HOSPITAL_BASED_OUTPATIENT_CLINIC_OR_DEPARTMENT_OTHER): Payer: Medicaid Other | Admitting: Internal Medicine

## 2019-04-01 VITALS — Ht 74.0 in | Wt 238.0 lb

## 2019-04-01 DIAGNOSIS — M86129 Other acute osteomyelitis, unspecified humerus: Secondary | ICD-10-CM

## 2019-04-01 LAB — AEROBIC/ANAEROBIC CULTURE W GRAM STAIN (SURGICAL/DEEP WOUND)

## 2019-04-01 NOTE — Progress Notes (Signed)
Office Visit Note   Patient: [name]           Date of Birth: 1955-12-12           MRN: [id number] Visit Date: 04/01/2019              Requested by: [name] [facility] [place],  [place] [address] PCP: [facility]  Chief Complaint  Patient presents with  . Left Foot - Routine Post Op    03/27/19 left foot transmet amputation       HPI: This is a pleasant 64 year old gentleman who is 6 days status post left foot transmetatarsal amputation.  He comes in today because his wound VAC has been beeping.  He is otherwise doing well  Assessment & Plan: Visit Diagnoses: No diagnosis found.  Plan: He will continue to be nonweightbearing and was encouraged to elevate his foot.  He may cleanse his foot daily with a mild soap and should apply a dry dressing he will be given a postop shoe today for protection.  Follow-up in 1 week Follow-Up Instructions: No follow-ups on file.   Ortho Exam  Patient is alert, oriented, no adenopathy, well-dressed, normal affect, normal respiratory effort. Focused examination of his left foot demonstrates wound edges are well approximated with no evidence of necrosis.  He did have a large hematoma on the on the wound VAC dressing.  There is some bloody drainage from the end of the wound.  Swelling is moderate.  No fluctuance no surrounding cellulitis no wound dehiscence  Imaging: No results found. No images are attached to the encounter.  Labs: Lab Results  Component Value Date   HGBA1C 7.2 (H) 03/25/2019   HGBA1C 7.1 (H) 03/28/2017   ESRSEDRATE 81 (H) 03/25/2019   CRP 1.9 (H) 03/25/2019   REPTSTATUS 04/01/2019 FINAL 03/27/2019   GRAMSTAIN  03/27/2019    RARE WBC PRESENT,BOTH PMN AND MONONUCLEAR NO ORGANISMS SEEN    CULT  03/27/2019    MODERATE ENTEROBACTER AEROGENES NO ANAEROBES ISOLATED CRITICAL RESULT CALLED TO, READ BACK BY AND VERIFIED WITH: RN A ABU [id number] AT 24 BY CM Performed  at [facility], [place] [address] [place], [place] [address]    [place] AEROGENES 03/27/2019     Lab Results  Component Value Date   ALBUMIN 3.7 03/25/2019   ALBUMIN 3.6 09/30/2017   ALBUMIN 3.9 03/16/2016   PREALBUMIN 17.6 (L) 03/25/2019    No results found for: MG No results found for: VD25OH  Lab Results  Component Value Date   PREALBUMIN 17.6 (L) 03/25/2019   CBC EXTENDED Latest Ref Rng & Units 03/31/2019 03/30/2019 03/30/2019  WBC 4.0 - 10.5 K/uL 5.9 - 6.1  RBC 4.22 - 5.81 MIL/uL 2.37(L) - 2.37(L)  HGB 13.0 - 17.0 g/dL 7.4(L) 7.6(L) 7.3(L)  HCT 39.0 - 52.0 % 21.6(L) 21.8(L) 21.5(L)  PLT 150 - 400 K/uL 183 - 152  NEUTROABS 1.7 - 7.7 K/uL - - -  LYMPHSABS 0.7 - 4.0 K/uL - - -     Body mass index is 30.56 kg/m.  Orders:  No orders of the defined types were placed in this encounter.  No orders of the defined types were placed in this encounter.    Procedures: No procedures performed  Clinical Data: No additional findings.  ROS:  All other systems negative, except as noted in the HPI. Review of Systems  Objective: Vital Signs: Ht 6' 2"  (1.88 m)   Wt 238  lb (108 kg)   BMI 30.56 kg/m   Specialty Comments:  No specialty comments available.  PMFS History: Patient Active Problem List   Diagnosis Date Noted  . Diabetic foot ulcer ([place]) 03/25/2019  . Idiopathic chronic gout of left foot without tophus   . Myelopathy ([place]) 07/16/2013  . Ingrown nail 07/05/2013  . Paronychia 07/05/2013  . Diverticulosis 04/22/2013  . Acute osteomyelitis of humerus ([place]) 02/07/2013  . Osteomyelitis of left foot ([place]) 02/01/2013  . Joint infection ([place]) 01/22/2013  . Pyogenic arthritis of shoulder region ([place]) 01/15/2013  . Nephrolithiasis 01/23/2012  . Lumbar radiculopathy 02/28/2011  . Gout flare 04/30/2010  . Chronic pain 02/14/2010  . NASH (nonalcoholic steatohepatitis) 11/08/2008  . Obesity 10/27/2008  . Hypertriglyceridemia 10/27/2007  . DM  (diabetes mellitus), type 2, uncontrolled ([place]) 03/12/2006  . Essential hypertension, benign 03/12/2006   Past Medical History:  Diagnosis Date  . Arthritis   . Back pain   . Diabetes mellitus without complication ([place])   . Diabetic foot ulcer ([place]) 03/2019  . Hypertension     Family History  Problem Relation Age of Onset  . Heart failure Mother   . Cancer Father     Past Surgical History:  Procedure Laterality Date  . BACK SURGERY    . CARPAL TUNNEL RELEASE    . CHOLECYSTECTOMY    . FOOT SURGERY    . JOINT REPLACEMENT    . SHOULDER ARTHROSCOPY    . TOTAL KNEE ARTHROPLASTY    . TRANSMETATARSAL AMPUTATION Left 03/27/2019   Procedure: TRANSMETATARSAL AMPUTATION left foot;  Surgeon: [name], MD;  Location: [place];  Service: Orthopedics;  Laterality: Left;   Social History   Occupational History  . Not on file  Tobacco Use  . Smoking status: Never Smoker  . Smokeless tobacco: Never Used  Substance and Sexual Activity  . Alcohol use: No  . Drug use: No  . Sexual activity: Not on file

## 2019-04-02 ENCOUNTER — Telehealth: Payer: Self-pay

## 2019-04-02 ENCOUNTER — Encounter (HOSPITAL_BASED_OUTPATIENT_CLINIC_OR_DEPARTMENT_OTHER): Payer: Medicaid Other | Admitting: Internal Medicine

## 2019-04-02 NOTE — Telephone Encounter (Signed)
Called and advised that pt continued to moderate amount of bloody drain from incision last night with blood clots. She advised that she applied a dry bulky dressing to the wound and requested orders for this to be changed three times a week. Will call with additional questions HHN did advise the pt that if this continued he should proceed to the ER.

## 2019-04-05 ENCOUNTER — Encounter (HOSPITAL_BASED_OUTPATIENT_CLINIC_OR_DEPARTMENT_OTHER): Payer: Medicaid Other | Admitting: Internal Medicine

## 2019-04-06 ENCOUNTER — Encounter (HOSPITAL_BASED_OUTPATIENT_CLINIC_OR_DEPARTMENT_OTHER): Payer: Medicaid Other | Admitting: Internal Medicine

## 2019-04-07 ENCOUNTER — Encounter (HOSPITAL_BASED_OUTPATIENT_CLINIC_OR_DEPARTMENT_OTHER): Payer: Medicaid Other | Admitting: Physician Assistant

## 2019-04-08 ENCOUNTER — Encounter (HOSPITAL_BASED_OUTPATIENT_CLINIC_OR_DEPARTMENT_OTHER): Payer: Medicaid Other | Admitting: Internal Medicine

## 2019-04-09 ENCOUNTER — Encounter (HOSPITAL_BASED_OUTPATIENT_CLINIC_OR_DEPARTMENT_OTHER): Payer: Medicaid Other | Admitting: Internal Medicine

## 2019-04-09 ENCOUNTER — Encounter: Payer: Self-pay | Admitting: Physician Assistant

## 2019-04-09 ENCOUNTER — Ambulatory Visit (INDEPENDENT_AMBULATORY_CARE_PROVIDER_SITE_OTHER): Payer: Medicaid Other | Admitting: Physician Assistant

## 2019-04-09 ENCOUNTER — Other Ambulatory Visit: Payer: Self-pay

## 2019-04-09 DIAGNOSIS — M86129 Other acute osteomyelitis, unspecified humerus: Secondary | ICD-10-CM

## 2019-04-09 MED ORDER — DOXYCYCLINE HYCLATE 100 MG PO TABS: 100.0000 mg | ORAL_TABLET | Freq: Two times a day (BID) | ORAL | 0 refills | Status: DC

## 2019-04-09 NOTE — Progress Notes (Signed)
Office Visit Note   Patient: [name]           Date of Birth: Oct 19, 1955           MRN: [id number] Visit Date: 04/09/2019              Requested by: [name] [facility] [place],  [place] [address] PCP: [facility]  No chief complaint on file.     HPI: This is a pleasant gentleman who is 13 days status post left transmetatarsal amputation he is overall doing well.  He denies any fever or chills.  Assessment & Plan: Visit Diagnoses: No diagnosis found.  Plan: Continue to do daily dry dressing changes.  Because of the redness I did call him in a short prescription for doxycycline.  Follow-up in 7 to 10 days  Follow-Up Instructions: No follow-ups on file.   Ortho Exam  Patient is alert, oriented, no adenopathy, well-dressed, normal affect, normal respiratory effort. Left amputation site some mild dehiscence with some mild superficial necrosis and fibrinous tissue.  No purulent drainage no foul odor he does have some erythema on the dorsal central portion of his foot adjacent to the incision this does not travel proximally.  No fluctuance  Imaging: No results found. No images are attached to the encounter.  Labs: Lab Results  Component Value Date   HGBA1C 7.2 (H) 03/25/2019   HGBA1C 7.1 (H) 03/28/2017   ESRSEDRATE 81 (H) 03/25/2019   CRP 1.9 (H) 03/25/2019   REPTSTATUS 04/01/2019 FINAL 03/27/2019   GRAMSTAIN  03/27/2019    RARE WBC PRESENT,BOTH PMN AND MONONUCLEAR NO ORGANISMS SEEN    CULT  03/27/2019    MODERATE ENTEROBACTER AEROGENES NO ANAEROBES ISOLATED CRITICAL RESULT CALLED TO, READ BACK BY AND VERIFIED WITH: RN A ABU [id number] AT 90 BY CM Performed at [facility], [place] [address] [place], [place] [address]    [place] AEROGENES 03/27/2019     Lab Results  Component Value Date   ALBUMIN 3.7 03/25/2019   ALBUMIN 3.6 09/30/2017   ALBUMIN 3.9 03/16/2016   PREALBUMIN 17.6 (L)  03/25/2019    No results found for: MG No results found for: VD25OH  Lab Results  Component Value Date   PREALBUMIN 17.6 (L) 03/25/2019   CBC EXTENDED Latest Ref Rng & Units 03/31/2019 03/30/2019 03/30/2019  WBC 4.0 - 10.5 K/uL 5.9 - 6.1  RBC 4.22 - 5.81 MIL/uL 2.37(L) - 2.37(L)  HGB 13.0 - 17.0 g/dL 7.4(L) 7.6(L) 7.3(L)  HCT 39.0 - 52.0 % 21.6(L) 21.8(L) 21.5(L)  PLT 150 - 400 K/uL 183 - 152  NEUTROABS 1.7 - 7.7 K/uL - - -  LYMPHSABS 0.7 - 4.0 K/uL - - -     There is no height or weight on file to calculate BMI.  Orders:  No orders of the defined types were placed in this encounter.  Meds ordered this encounter  Medications  . doxycycline (VIBRA-TABS) 100 MG tablet    Sig: Take 1 tablet (100 mg total) by mouth 2 (two) times daily.    Dispense:  20 tablet    Refill:  0     Procedures: No procedures performed  Clinical Data: No additional findings.  ROS:  All other systems negative, except as noted in the HPI. Review of Systems  Objective: Vital Signs: There were no vitals taken for this visit.  Specialty Comments:  No specialty comments available.  PMFS History: Patient Active Problem List  Diagnosis Date Noted  . Diabetic foot ulcer ([place]) 03/25/2019  . Idiopathic chronic gout of left foot without tophus   . Myelopathy ([place]) 07/16/2013  . Ingrown nail 07/05/2013  . Paronychia 07/05/2013  . Diverticulosis 04/22/2013  . Acute osteomyelitis of humerus ([place]) 02/07/2013  . Osteomyelitis of left foot ([place]) 02/01/2013  . Joint infection ([place]) 01/22/2013  . Pyogenic arthritis of shoulder region ([place]) 01/15/2013  . Nephrolithiasis 01/23/2012  . Lumbar radiculopathy 02/28/2011  . Gout flare 04/30/2010  . Chronic pain 02/14/2010  . NASH (nonalcoholic steatohepatitis) 11/08/2008  . Obesity 10/27/2008  . Hypertriglyceridemia 10/27/2007  . DM (diabetes mellitus), type 2, uncontrolled ([place]) 03/12/2006  . Essential hypertension, benign 03/12/2006   Past Medical  History:  Diagnosis Date  . Arthritis   . Back pain   . Diabetes mellitus without complication ([place])   . Diabetic foot ulcer ([place]) 03/2019  . Hypertension     Family History  Problem Relation Age of Onset  . Heart failure Mother   . Cancer Father     Past Surgical History:  Procedure Laterality Date  . BACK SURGERY    . CARPAL TUNNEL RELEASE    . CHOLECYSTECTOMY    . FOOT SURGERY    . JOINT REPLACEMENT    . SHOULDER ARTHROSCOPY    . TOTAL KNEE ARTHROPLASTY    . TRANSMETATARSAL AMPUTATION Left 03/27/2019   Procedure: TRANSMETATARSAL AMPUTATION left foot;  Surgeon: [name], MD;  Location: [place];  Service: Orthopedics;  Laterality: Left;   Social History   Occupational History  . Not on file  Tobacco Use  . Smoking status: Never Smoker  . Smokeless tobacco: Never Used  Substance and Sexual Activity  . Alcohol use: No  . Drug use: No  . Sexual activity: Not on file

## 2019-04-11 ENCOUNTER — Emergency Department (HOSPITAL_COMMUNITY): Payer: Medicaid Other

## 2019-04-11 ENCOUNTER — Inpatient Hospital Stay (HOSPITAL_COMMUNITY)
Admission: EM | Admit: 2019-04-11 | Discharge: 2019-04-15 | DRG: 564 | Disposition: A | Discharge status: Home Health | Payer: Medicaid Other | Attending: Family Medicine | Admitting: Family Medicine

## 2019-04-11 ENCOUNTER — Other Ambulatory Visit: Payer: Self-pay

## 2019-04-11 ENCOUNTER — Encounter (HOSPITAL_COMMUNITY): Payer: Self-pay

## 2019-04-11 ENCOUNTER — Inpatient Hospital Stay (HOSPITAL_COMMUNITY): Payer: Medicaid Other

## 2019-04-11 DIAGNOSIS — A4159 Other Gram-negative sepsis: Secondary | ICD-10-CM | POA: Diagnosis present

## 2019-04-11 DIAGNOSIS — T8744 Infection of amputation stump, left lower extremity: Principal | ICD-10-CM | POA: Diagnosis present

## 2019-04-11 DIAGNOSIS — Z794 Long term (current) use of insulin: Secondary | ICD-10-CM

## 2019-04-11 DIAGNOSIS — E1165 Type 2 diabetes mellitus with hyperglycemia: Secondary | ICD-10-CM | POA: Diagnosis present

## 2019-04-11 DIAGNOSIS — Z79891 Long term (current) use of opiate analgesic: Secondary | ICD-10-CM

## 2019-04-11 DIAGNOSIS — Z7982 Long term (current) use of aspirin: Secondary | ICD-10-CM | POA: Diagnosis not present

## 2019-04-11 DIAGNOSIS — Z79899 Other long term (current) drug therapy: Secondary | ICD-10-CM

## 2019-04-11 DIAGNOSIS — M109 Gout, unspecified: Secondary | ICD-10-CM | POA: Diagnosis present

## 2019-04-11 DIAGNOSIS — T8781 Dehiscence of amputation stump: Secondary | ICD-10-CM | POA: Diagnosis present

## 2019-04-11 DIAGNOSIS — Y92009 Unspecified place in unspecified non-institutional (private) residence as the place of occurrence of the external cause: Secondary | ICD-10-CM

## 2019-04-11 DIAGNOSIS — I10 Essential (primary) hypertension: Secondary | ICD-10-CM | POA: Diagnosis present

## 2019-04-11 DIAGNOSIS — T364X6A Underdosing of tetracyclines, initial encounter: Secondary | ICD-10-CM | POA: Diagnosis present

## 2019-04-11 DIAGNOSIS — Z20822 Contact with and (suspected) exposure to covid-19: Secondary | ICD-10-CM | POA: Diagnosis present

## 2019-04-11 DIAGNOSIS — M86172 Other acute osteomyelitis, left ankle and foot: Secondary | ICD-10-CM | POA: Diagnosis present

## 2019-04-11 DIAGNOSIS — L03116 Cellulitis of left lower limb: Secondary | ICD-10-CM | POA: Diagnosis present

## 2019-04-11 DIAGNOSIS — Z7952 Long term (current) use of systemic steroids: Secondary | ICD-10-CM | POA: Diagnosis not present

## 2019-04-11 DIAGNOSIS — Z96659 Presence of unspecified artificial knee joint: Secondary | ICD-10-CM | POA: Diagnosis present

## 2019-04-11 DIAGNOSIS — L089 Local infection of the skin and subcutaneous tissue, unspecified: Secondary | ICD-10-CM

## 2019-04-11 DIAGNOSIS — T8144XA Sepsis following a procedure, initial encounter: Secondary | ICD-10-CM | POA: Diagnosis present

## 2019-04-11 DIAGNOSIS — Z96619 Presence of unspecified artificial shoulder joint: Secondary | ICD-10-CM | POA: Diagnosis present

## 2019-04-11 DIAGNOSIS — G894 Chronic pain syndrome: Secondary | ICD-10-CM | POA: Diagnosis present

## 2019-04-11 DIAGNOSIS — A419 Sepsis, unspecified organism: Secondary | ICD-10-CM

## 2019-04-11 DIAGNOSIS — T148XXA Other injury of unspecified body region, initial encounter: Secondary | ICD-10-CM | POA: Diagnosis not present

## 2019-04-11 DIAGNOSIS — M10061 Idiopathic gout, right knee: Secondary | ICD-10-CM | POA: Diagnosis not present

## 2019-04-11 DIAGNOSIS — E872 Acidosis, unspecified: Secondary | ICD-10-CM | POA: Diagnosis present

## 2019-04-11 DIAGNOSIS — R739 Hyperglycemia, unspecified: Secondary | ICD-10-CM | POA: Diagnosis present

## 2019-04-11 DIAGNOSIS — Y835 Amputation of limb(s) as the cause of abnormal reaction of the patient, or of later complication, without mention of misadventure at the time of the procedure: Secondary | ICD-10-CM | POA: Diagnosis present

## 2019-04-11 DIAGNOSIS — Z8249 Family history of ischemic heart disease and other diseases of the circulatory system: Secondary | ICD-10-CM

## 2019-04-11 LAB — CBC WITH DIFFERENTIAL/PLATELET
Abs Immature Granulocytes: 0.15 10*3/uL — ABNORMAL HIGH (ref 0.00–0.07)
Basophils Absolute: 0.1 10*3/uL (ref 0.0–0.1)
Basophils Relative: 0 %
Eosinophils Absolute: 0.1 10*3/uL (ref 0.0–0.5)
Eosinophils Relative: 0 %
HCT: 31 % — ABNORMAL LOW (ref 39.0–52.0)
Hemoglobin: 9.8 g/dL — ABNORMAL LOW (ref 13.0–17.0)
Immature Granulocytes: 1 %
Lymphocytes Relative: 5 %
Lymphs Abs: 0.7 10*3/uL (ref 0.7–4.0)
MCH: 30.9 pg (ref 26.0–34.0)
MCHC: 31.6 g/dL (ref 30.0–36.0)
MCV: 97.8 fL (ref 80.0–100.0)
Monocytes Absolute: 0.6 10*3/uL (ref 0.1–1.0)
Monocytes Relative: 4 %
Neutro Abs: 11.9 10*3/uL — ABNORMAL HIGH (ref 1.7–7.7)
Neutrophils Relative %: 90 %
Platelets: 173 10*3/uL (ref 150–400)
RBC: 3.17 MIL/uL — ABNORMAL LOW (ref 4.22–5.81)
RDW: 17.3 % — ABNORMAL HIGH (ref 11.5–15.5)
WBC: 13.4 10*3/uL — ABNORMAL HIGH (ref 4.0–10.5)
nRBC: 0 % (ref 0.0–0.2)

## 2019-04-11 LAB — CBG MONITORING, ED
Glucose-Capillary: 89 mg/dL (ref 70–99)
Glucose-Capillary: 173 mg/dL — ABNORMAL HIGH (ref 70–99)

## 2019-04-11 LAB — C-REACTIVE PROTEIN: CRP: 1.4 mg/dL — ABNORMAL HIGH (ref <1.0)

## 2019-04-11 LAB — COMPREHENSIVE METABOLIC PANEL
ALT: 10 U/L (ref 0–44)
AST: 13 U/L — ABNORMAL LOW (ref 15–41)
Albumin: 3.5 g/dL (ref 3.5–5.0)
Alkaline Phosphatase: 62 U/L (ref 38–126)
Anion gap: 11 (ref 5–15)
BUN: 13 mg/dL (ref 8–23)
CO2: 19 mmol/L — ABNORMAL LOW (ref 22–32)
Calcium: 9.2 mg/dL (ref 8.9–10.3)
Chloride: 108 mmol/L (ref 98–111)
Creatinine, Ser: 1.04 mg/dL (ref 0.61–1.24)
GFR calc Af Amer: >60 mL/min (ref >60)
GFR calc non Af Amer: >60 mL/min (ref >60)
Glucose, Bld: 215 mg/dL — ABNORMAL HIGH (ref 70–99)
Potassium: 4.1 mmol/L (ref 3.5–5.1)
Sodium: 138 mmol/L (ref 135–145)
Total Bilirubin: 0.7 mg/dL (ref 0.3–1.2)
Total Protein: 7.4 g/dL (ref 6.5–8.1)

## 2019-04-11 LAB — URINALYSIS, ROUTINE W REFLEX MICROSCOPIC
Bacteria, UA: NONE SEEN
Bilirubin Urine: NEGATIVE
Glucose, UA: >=500 mg/dL — AB
Hgb urine dipstick: NEGATIVE
Ketones, ur: NEGATIVE mg/dL
Leukocytes,Ua: NEGATIVE
Nitrite: NEGATIVE
Protein, ur: 100 mg/dL — AB
Specific Gravity, Urine: 1.021 (ref 1.005–1.030)
pH: 5 (ref 5.0–8.0)

## 2019-04-11 LAB — LACTIC ACID, PLASMA
Lactic Acid, Venous: 2.3 mmol/L (ref 0.5–1.9)
Lactic Acid, Venous: 3.3 mmol/L (ref 0.5–1.9)
Lactic Acid, Venous: 1.7 mmol/L (ref 0.5–1.9)
Lactic Acid, Venous: 1.1 mmol/L (ref 0.5–1.9)

## 2019-04-11 LAB — PROTIME-INR
INR: 1.1 (ref 0.8–1.2)
Prothrombin Time: 14.3 seconds (ref 11.4–15.2)

## 2019-04-11 LAB — GLUCOSE, CAPILLARY
Glucose-Capillary: 98 mg/dL (ref 70–99)
Glucose-Capillary: 132 mg/dL — ABNORMAL HIGH (ref 70–99)

## 2019-04-11 LAB — SARS CORONAVIRUS 2 (TAT 6-24 HRS): SARS Coronavirus 2: NEGATIVE

## 2019-04-11 LAB — TROPONIN I (HIGH SENSITIVITY)
Troponin I (High Sensitivity): 26 ng/L — ABNORMAL HIGH (ref <18)
Troponin I (High Sensitivity): 38 ng/L — ABNORMAL HIGH (ref <18)

## 2019-04-11 LAB — APTT: aPTT: 30 seconds (ref 24–36)

## 2019-04-11 MED ORDER — INSULIN ASPART 100 UNIT/ML ~~LOC~~ SOLN
0.0000 [IU] | Freq: Three times a day (TID) | SUBCUTANEOUS | Status: DC
  Administered 2019-04-11: 12:00:00 3 [IU] via SUBCUTANEOUS
  Administered 2019-04-11 – 2019-04-12 (×3): 2 [IU] via SUBCUTANEOUS
  Administered 2019-04-13 (×4): 3 [IU] via SUBCUTANEOUS
  Administered 2019-04-14 (×2): 2 [IU] via SUBCUTANEOUS
  Administered 2019-04-14 – 2019-04-15 (×3): 3 [IU] via SUBCUTANEOUS
  Administered 2019-04-15: 07:00:00 2 [IU] via SUBCUTANEOUS

## 2019-04-11 MED ORDER — INSULIN DETEMIR 100 UNIT/ML ~~LOC~~ SOLN
35.0000 [IU] | Freq: Two times a day (BID) | SUBCUTANEOUS | Status: DC
  Administered 2019-04-11 – 2019-04-15 (×9): 35 [IU] via SUBCUTANEOUS
  Filled 2019-04-11 (×11): qty 0.35

## 2019-04-11 MED ORDER — HYDROMORPHONE HCL 1 MG/ML IJ SOLN
0.5000 mg | INTRAMUSCULAR | Status: DC | PRN
  Administered 2019-04-11 – 2019-04-12 (×4): 0.5 mg via INTRAVENOUS
  Filled 2019-04-11 (×5): qty 1

## 2019-04-11 MED ORDER — AMITRIPTYLINE HCL 25 MG PO TABS
25.0000 mg | ORAL_TABLET | Freq: Every day | ORAL | Status: DC
  Administered 2019-04-11 – 2019-04-14 (×4): 25 mg via ORAL
  Filled 2019-04-11 (×4): qty 1

## 2019-04-11 MED ORDER — METOPROLOL SUCCINATE ER 50 MG PO TB24
100.0000 mg | ORAL_TABLET | Freq: Every day | ORAL | Status: DC
  Administered 2019-04-11 – 2019-04-15 (×5): 100 mg via ORAL
  Filled 2019-04-11: qty 4
  Filled 2019-04-11 (×4): qty 2

## 2019-04-11 MED ORDER — LACTATED RINGERS IV BOLUS (SEPSIS)
1000.0000 mL | Freq: Once | INTRAVENOUS | Status: AC
  Administered 2019-04-11: 05:00:00 1000 mL via INTRAVENOUS

## 2019-04-11 MED ORDER — AMLODIPINE BESYLATE 5 MG PO TABS
5.0000 mg | ORAL_TABLET | Freq: Every day | ORAL | Status: DC
  Administered 2019-04-11 – 2019-04-15 (×5): 5 mg via ORAL
  Filled 2019-04-11 (×6): qty 1

## 2019-04-11 MED ORDER — OXYCODONE HCL 5 MG PO TABS
10.0000 mg | ORAL_TABLET | ORAL | Status: DC | PRN
  Administered 2019-04-11 – 2019-04-12 (×3): 10 mg via ORAL
  Filled 2019-04-11 (×3): qty 2

## 2019-04-11 MED ORDER — SODIUM CHLORIDE 0.9 % IV BOLUS
500.0000 mL | Freq: Once | INTRAVENOUS | Status: AC
  Administered 2019-04-11: 04:00:00 500 mL via INTRAVENOUS

## 2019-04-11 MED ORDER — SODIUM CHLORIDE 0.9% FLUSH
3.0000 mL | Freq: Once | INTRAVENOUS | Status: AC
  Administered 2019-04-11: 03:00:00 3 mL via INTRAVENOUS

## 2019-04-11 MED ORDER — VANCOMYCIN HCL IN DEXTROSE 1-5 GM/200ML-% IV SOLN
1000.0000 mg | Freq: Two times a day (BID) | INTRAVENOUS | Status: DC
  Administered 2019-04-11: 12:00:00 1000 mg via INTRAVENOUS
  Filled 2019-04-11 (×3): qty 200

## 2019-04-11 MED ORDER — VANCOMYCIN HCL 2000 MG/400ML IV SOLN
2000.0000 mg | Freq: Once | INTRAVENOUS | Status: AC
  Administered 2019-04-11: 2000 mg via INTRAVENOUS
  Filled 2019-04-11: qty 400

## 2019-04-11 MED ORDER — VANCOMYCIN HCL IN DEXTROSE 1-5 GM/200ML-% IV SOLN
1000.0000 mg | Freq: Two times a day (BID) | INTRAVENOUS | Status: DC
  Administered 2019-04-12 – 2019-04-13 (×4): 1000 mg via INTRAVENOUS
  Filled 2019-04-11 (×4): qty 200

## 2019-04-11 MED ORDER — GABAPENTIN 300 MG PO CAPS
300.0000 mg | ORAL_CAPSULE | Freq: Three times a day (TID) | ORAL | Status: DC
  Administered 2019-04-11 – 2019-04-15 (×13): 300 mg via ORAL
  Filled 2019-04-11 (×13): qty 1

## 2019-04-11 MED ORDER — ALLOPURINOL 300 MG PO TABS
300.0000 mg | ORAL_TABLET | Freq: Every day | ORAL | Status: DC
  Administered 2019-04-11 – 2019-04-15 (×5): 300 mg via ORAL
  Filled 2019-04-11 (×5): qty 1

## 2019-04-11 MED ORDER — SODIUM CHLORIDE 0.9 % IV SOLN
2.0000 g | Freq: Once | INTRAVENOUS | Status: AC
  Administered 2019-04-11: 2 g via INTRAVENOUS
  Filled 2019-04-11: qty 20

## 2019-04-11 MED ORDER — VANCOMYCIN HCL IN DEXTROSE 1-5 GM/200ML-% IV SOLN
1000.0000 mg | Freq: Once | INTRAVENOUS | Status: DC
  Filled 2019-04-11: qty 200

## 2019-04-11 MED ORDER — SODIUM CHLORIDE 0.9 % IV SOLN
2.0000 g | Freq: Three times a day (TID) | INTRAVENOUS | Status: DC
  Administered 2019-04-11 – 2019-04-15 (×13): 2 g via INTRAVENOUS
  Filled 2019-04-11 (×13): qty 2

## 2019-04-11 MED ORDER — ENOXAPARIN SODIUM 40 MG/0.4ML ~~LOC~~ SOLN
40.0000 mg | SUBCUTANEOUS | Status: DC
  Administered 2019-04-11 – 2019-04-15 (×5): 40 mg via SUBCUTANEOUS
  Filled 2019-04-11 (×4): qty 0.4

## 2019-04-11 MED ORDER — LISINOPRIL 20 MG PO TABS
40.0000 mg | ORAL_TABLET | Freq: Every day | ORAL | Status: DC
  Administered 2019-04-11 – 2019-04-15 (×5): 40 mg via ORAL
  Filled 2019-04-11 (×6): qty 2

## 2019-04-11 MED ORDER — SODIUM CHLORIDE 0.9 % IV BOLUS
500.0000 mL | Freq: Once | INTRAVENOUS | Status: AC
  Administered 2019-04-11: 500 mL via INTRAVENOUS

## 2019-04-11 MED ORDER — COLCHICINE 0.6 MG PO TABS
1.2000 mg | ORAL_TABLET | Freq: Once | ORAL | Status: AC
  Administered 2019-04-11: 05:00:00 1.2 mg via ORAL
  Filled 2019-04-11: qty 2

## 2019-04-11 MED ORDER — COLCHICINE 0.6 MG PO TABS
0.6000 mg | ORAL_TABLET | Freq: Once | ORAL | Status: AC
  Administered 2019-04-11: 05:00:00 0.6 mg via ORAL
  Filled 2019-04-11: qty 1

## 2019-04-11 MED ORDER — LACTATED RINGERS IV BOLUS (SEPSIS)
1000.0000 mL | Freq: Once | INTRAVENOUS | Status: AC
  Administered 2019-04-11: 1000 mL via INTRAVENOUS

## 2019-04-11 MED ORDER — LACTATED RINGERS IV BOLUS (SEPSIS)
1000.0000 mL | Freq: Once | INTRAVENOUS | Status: DC
  Administered 2019-04-11: 05:00:00 1000 mL via INTRAVENOUS

## 2019-04-11 NOTE — H&P (Signed)
History and Physical    [name] ZCH:[id number] DOB: [birth year] DOA: 04/11/2019  PCP: [name] [place]  Patient coming from: Home   Chief Complaint:  Chief Complaint  Patient presents with  . Wound Infection     HPI:    64 year old male with past medical history of diabetes mellitus type 2, hypertension, chronic pain syndrome, multiple episodes of osteomyelitis in the past (osteo left foot in 2020 and right humerus in 2015), and recent hospitalization at [facility] in early March for osteomyelitis of the status post transmetatarsal amputation of the left foot by [name] who has again presented to [facility] emergency department with worsening redness swelling and pain of the left foot.  Wound culture during the hospitalization grew out Enterobacter aerogenes  Patient explains that approximately 5 days ago he began to develop recurrent pain of the left foot.  Pain is throbbing in quality, moderate to severe in intensity nonradiating and worse with weightbearing or movement of the affected extremity.  As patient's pain worsened over the next several days this was associated with increasing redness and swelling of the left foot.  Patient is complaining of associated poor appetite and difficulty with ambulation.  Patient also states that since his discharge from [facility] earlier this month the wound of his left foot has proceeded to open up and is actively bleeding despite regular dressing changes.  Patient symptoms continue to worsen until he eventually presented to his orthopedic surgeon Dr. Jess Barters clinic on 3/26 where he was prescribed doxycycline.  Patient states that his pharmacy did not have any doxycycline and had to order it so he was unable to take this medication.  Patient eventually presented to [facility] emergency department the evening of 3/27 due to worsening pain swelling and redness of the foot.  Upon evaluation in the  emergency department patient was found to have a substantial lactic acidosis of 3.3, with multiple sirs criteria including leukocytosis of 13.4 and tachycardia all concerning for developing sepsis secondary to osteomyelitis or soft tissue infection of the left foot.  Patient was initiated on intravenous antibiotic therapy and normal saline boluses were also initiated before the hospitalist group was called to assess the patient for admission to the hospital.       Review of Systems: A 10-system review of systems has been performed and all systems are negative with the exception of what is listed in the HPI.   Past Medical History:  Diagnosis Date  . Arthritis   . Back pain   . Diabetes mellitus without complication ([place])   . Diabetic foot ulcer ([place]) 03/2019  . Hypertension     Past Surgical History:  Procedure Laterality Date  . BACK SURGERY    . CARPAL TUNNEL RELEASE    . CHOLECYSTECTOMY    . FOOT SURGERY    . JOINT REPLACEMENT    . SHOULDER ARTHROSCOPY    . TOTAL KNEE ARTHROPLASTY    . TRANSMETATARSAL AMPUTATION Left 03/27/2019   Procedure: TRANSMETATARSAL AMPUTATION left foot;  Surgeon: [name], MD;  Location: [facility];  Service: Orthopedics;  Laterality: Left;     reports that he has never smoked. He has never used smokeless tobacco. He reports that he does not drink alcohol or use drugs.  Allergies  Allergen Reactions  . Other Hives    Anti-snake venim for copperhead venom.  . Pravastatin Other (See Comments)    Leg cramps, elevated CK  . Prednisone Other (See  Comments)    Patient states "it runs my blood sugar up so I don't take it."  . Toradol [Ketorolac Tromethamine] Hives and Nausea And Vomiting    Pill form only    Family History  Problem Relation Age of Onset  . Heart failure Mother   . Cancer Father      Prior to Admission medications   Medication Sig Start Date End Date Taking? Authorizing Provider  allopurinol (ZYLOPRIM) 300 MG tablet Take 300  mg by mouth daily. 02/19/19  Yes [provider]  amitriptyline (ELAVIL) 25 MG tablet Take 25 mg by mouth at bedtime. 03/11/19  Yes [provider]  amLODipine (NORVASC) 10 MG tablet Take 5 mg by mouth daily.  02/08/16  Yes [provider]  aspirin EC 81 MG tablet Take 81 mg by mouth daily.   Yes [provider]  furosemide (LASIX) 20 MG tablet Take 20 mg by mouth daily. 01/06/19  Yes [provider]  gabapentin (NEURONTIN) 300 MG capsule Take 300 mg by mouth 3 (three) times daily. 03/16/19  Yes [provider]  LEVEMIR FLEXTOUCH 100 UNIT/ML Pen Inject 70 Units into the skin 2 (two) times daily.  10/08/13  Yes [provider]  lisinopril (PRINIVIL,ZESTRIL) 40 MG tablet Take 40 mg by mouth daily. 03/05/16  Yes [provider]  metoprolol succinate (TOPROL-XL) 100 MG 24 hr tablet Take 100 mg by mouth daily. 02/04/19  Yes [provider]  Oxycodone HCl 10 MG TABS Take 10 mg by mouth every 6 (six) hours. 02/19/19  Yes [provider]  doxycycline (VIBRA-TABS) 100 MG tablet Take 1 tablet (100 mg total) by mouth 2 (two) times daily. 04/09/19   Persons, Bevely Palmer, PA  predniSONE (DELTASONE) 10 MG tablet Take 3 tablets (30 mg total) by mouth daily with breakfast. 04/01/19   [name], MD    Physical Exam: Vitals:   04/11/19 0400 04/11/19 0415 04/11/19 0430 04/11/19 0445  BP: 133/85 135/85  137/86  Pulse: 99 90 92 82  Resp: 20 20 (!) 22 (!) 27  Temp:      TempSrc:      SpO2: 99% 94% 100% 96%  Weight:      Height:        Constitutional: Acute alert and oriented x3, no associated distress.   Skin: Significant redness of the left foot with evidence of surgical wound of the left foot with area of dehiscence and active bloody drainage.  Skin turgor is poor.  No other ulcerations or rashes noted. Eyes: Pupils are equally reactive to light.  No evidence of scleral icterus or conjunctival pallor.  ENMT: Mucous  membranes are moist. Posterior pharynx clear of any exudate or lesions. Normal dentition.   Neck: normal, supple, no masses, no thyromegaly Respiratory: clear to auscultation bilaterally, no wheezing no org, no crackles. Normal respiratory effort. No accessory muscle use.  Cardiovascular: Tachycardic but regular, no murmurs / rubs / gallops. No extremity edema. 2+ pedal pulses. No carotid bruits.  Back:   Nontender without crepitus or deformity. Abdomen: Abdomen is soft and nontender.  No evidence of intra-abdominal masses.  Positive bowel sounds noted in all quadrants.   Musculoskeletal: Significant warmth, redness and tenderness of the distal left lower extremity with associated edema.  Deformity of the left foot secondary to transmetatarsal amputation.  No contractures. Normal muscle tone.  Neurologic: CN 2-12 grossly intact. Sensation intact, patient is moving all 4 extremities spontaneously patient is following all commands.  Patient  is responsive to verbal stimuli.   Psychiatric: Patient presents as a normal mood with appropriate affect.  Patient seems to possess insight as to theircurrent situation.     Labs on Admission: I have personally reviewed following labs and imaging studies -   CBC: Recent Labs  Lab 04/11/19 0032  WBC 13.4*  NEUTROABS 11.9*  HGB 9.8*  HCT 31.0*  MCV 97.8  PLT 527   Basic Metabolic Panel: Recent Labs  Lab 04/11/19 0032  NA 138  K 4.1  CL 108  CO2 19*  GLUCOSE 215*  BUN 13  CREATININE 1.04  CALCIUM 9.2   GFR: Estimated Creatinine Clearance: 101.8 mL/min (by C-G formula based on SCr of 1.04 mg/dL). Liver Function Tests: Recent Labs  Lab 04/11/19 0032  AST 13*  ALT 10  ALKPHOS 62  BILITOT 0.7  PROT 7.4  ALBUMIN 3.5   No results for input(s): LIPASE, AMYLASE in the last 168 hours. No results for input(s): AMMONIA in the last 168 hours. Coagulation Profile: Recent Labs  Lab 04/11/19 0236  INR 1.1   Cardiac Enzymes: No results for  input(s): CKTOTAL, CKMB, CKMBINDEX, TROPONINI in the last 168 hours. BNP (last 3 results) No results for input(s): PROBNP in the last 8760 hours. HbA1C: No results for input(s): HGBA1C in the last 72 hours. CBG: No results for input(s): GLUCAP in the last 168 hours. Lipid Profile: No results for input(s): CHOL, HDL, LDLCALC, TRIG, CHOLHDL, LDLDIRECT in the last 72 hours. Thyroid Function Tests: No results for input(s): TSH, T4TOTAL, FREET4, T3FREE, THYROIDAB in the last 72 hours. Anemia Panel: No results for input(s): VITAMINB12, FOLATE, FERRITIN, TIBC, IRON, RETICCTPCT in the last 72 hours. Urine analysis:    Component Value Date/Time   COLORURINE YELLOW 04/11/2019 0030   APPEARANCEUR CLEAR 04/11/2019 0030   LABSPEC 1.021 04/11/2019 0030   PHURINE 5.0 04/11/2019 0030   GLUCOSEU >=500 (A) 04/11/2019 0030   HGBUR NEGATIVE 04/11/2019 0030   BILIRUBINUR NEGATIVE 04/11/2019 0030   KETONESUR NEGATIVE 04/11/2019 0030   PROTEINUR 100 (A) 04/11/2019 0030   NITRITE NEGATIVE 04/11/2019 0030   LEUKOCYTESUR NEGATIVE 04/11/2019 0030    Radiological Exams on Admission: DG Chest Port 1 View  Result Date: 04/11/2019 CLINICAL DATA:  Syncope. EXAM: PORTABLE CHEST 1 VIEW COMPARISON:  01/04/2019 FINDINGS: The cardiomediastinal contours are normal. The lungs are clear. Pulmonary vasculature is normal. No consolidation, pleural effusion, or pneumothorax. No acute osseous abnormalities are seen. IMPRESSION: Negative chest radiograph. Electronically Signed   By: Keith Rake M.D.   On: 04/11/2019 02:55    EKG: Personally reviewed.  Rhythm is normal sinus rhythm with heart rate of 98.  No dynamic ST segment changes appreciated.  Assessment/Plan Principal Problem:   Acute osteomyelitis and cellulitis of left foot [facility])   Patient exhibiting substantial pain redness and induration of the left foot with evidence of wound dehiscence and bloody drainage.  Considering multiple SIRS criteria and  substantial lactic acidosis I am concerned patient is developing early sepsis secondary to a persisting infection.  Patient is been placed on broad-spectrum intravenous antibiotic therapy with intravenous cefepime based on previous cultures growing out Enterobacter Aerogenes with additional intravenous vancomycin ordered  Patient is being provided with 30 cc/kg of isotonic normal saline in the emergency department  We will continue to provide additional intravenous fluids based on serial lactic acid levels and clinical reevaluation  Blood cultures ordered  Day team to formally consult [name] in the morning  Wound care team consultation placed  to evaluate associated wound with dehiscence  MRI of left foot ordered to identify any residual osteomyelitis or abscess formation.  Active Problems:   Lactic acidosis   Patient exhibiting substantial lactic acidosis that was initially 2.3 on arrival to the emergency department that rose to 3.3 despite intravenous volume resuscitation.  Patient is currently being provided with 30 cc/kg of isotonic fluids  Patient is being provided with broad-spectrum intravenous antibiotic therapy  Performing serial lactic acid levels to ensure downtrending and resolution.    Sepsis due to Enterobacter [facility])   Please see assessment and plan above.    Uncontrolled type 2 diabetes mellitus with hyperglycemia, with long-term current use of insulin ([place])   Patient states that he frequently has low blood sugar as of late and therefore we will temporarily place patient on a reduced regimen of Levemir 35 units twice daily  Ack checks before every meal and nightly with sliding scale insulin  Recent hemoglobin A1c of 7.2 reveals reasonable outpatient management  Diabetic diet    Acute gout of right knee   Patient was diagnosed with gout of the right knee during last hospitalization and treated with a course of colchicine and prednisone  Patient states  that he is beginning to develop worsening right knee pain swelling and warmth in the past few days concerning for recurrent flare of gout  Will attempt a trial of colchicine and monitor for symptomatic improvement   will abstain from repeat dosing of prednisone for now.      Code Status:  Full code  Disposition Plan: Patient is anticipated to be discharged to home once patient has met maximum benefit from current hospitalization.   Consults called: [name] to be formally consulted in the morning Admission status: Patient will be admitted to Inpatient and is anticipated to remain in the hospital for greater than 2 midnights.   Vernelle Emerald MD [facility] Pager [phone]  If 7PM-7AM, please contact night-coverage [email or web address] Use universal [place] password for that web site. If you do not have the password, please call the hospital operator.  04/11/2019, 5:16 AM

## 2019-04-11 NOTE — Progress Notes (Signed)
Pharmacy Antibiotic Note  [name] is a 64 y.o. male admitted on 04/11/2019 with cellulitis.  Pharmacy has been consulted for Vancomycin dosing.  Plan: Vancomycin 2 grams iv x 1 dose now then 1 gram iv Q 12 hours Ceftriaxone 2 grams iv Q 24 Follow up cultures, plan     Temp (24hrs), Avg:98.4 F (36.9 C), Min:98.4 F (36.9 C), Max:98.4 F (36.9 C)  Recent Labs  Lab 04/11/19 0031 04/11/19 0032  WBC  --  13.4*  CREATININE  --  1.04  LATICACIDVEN 2.3*  --     Estimated Creatinine Clearance: 97.6 mL/min (by C-G formula based on SCr of 1.04 mg/dL).    Allergies  Allergen Reactions  . Other Hives    Anti-snake venim for copperhead venom.  . Pravastatin Other (See Comments)    Leg cramps, elevated CK  . Prednisone Other (See Comments)    Patient states "it runs my blood sugar up so I don't take it."  . Toradol [Ketorolac Tromethamine] Hives and Nausea And Vomiting    Pill form only    Thank you for allowing pharmacy to be a part of this patient's care. Anette Guarneri, PharmD  04/11/2019 2:53 AM

## 2019-04-11 NOTE — ED Provider Notes (Addendum)
[facility] EMERGENCY DEPARTMENT Provider Note   CSN: [id number] Arrival date & time: 04/11/19  0000     History Chief Complaint  Patient presents with  . Wound Infection    [name] is a 64 y.o. male with a hx of arthritis, back pain, insulin-dependent diabetes, diabetic foot ulcer, hypertension presents to the Emergency Department complaining of gradual, persistent, progressively worsening redness, increasing pain and increasing warmth to the surgical site of the left foot.  Patient reports he recently had all toes of his left foot amputated by [name].  He reports over the last few days he has been concerned about increasing infection.  He saw Dr. Jess Barters APP on Friday who agreed and prescribed an antibiotic however he has been unable to fill it over the last 2 days.  He does not know what he was prescribed.  Movement and palpation make the symptoms worse.  Nothing seems to make it better.  Patient reports he does have associated purulent drainage but no bleeding.  Patient reports that tonight he checked his blood sugar, found to be 200, take his insulin and then went to dinner.  He reports at dinner he had a syncopal episode.  He denies chest pain or shortness of breath.  He was transported to [facility].  He reports they gave him Narcan and created a headache.  He reports he left there AMA and presented here for concerns about infection.  Patient reports they did do an EKG which is pictured below in the chart.   The history is provided by the patient and medical records. No language interpreter was used.       Past Medical History:  Diagnosis Date  . Arthritis   . Back pain   . Diabetes mellitus without complication ([place])   . Diabetic foot ulcer ([place]) 03/2019  . Hypertension     Patient Active Problem List   Diagnosis Date Noted  . Diabetic foot ulcer ([place]) 03/25/2019  . Idiopathic chronic gout of left foot without tophus   . Myelopathy ([place])  07/16/2013  . Ingrown nail 07/05/2013  . Paronychia 07/05/2013  . Diverticulosis 04/22/2013  . Acute osteomyelitis of humerus ([place]) 02/07/2013  . Osteomyelitis of left foot ([place]) 02/01/2013  . Joint infection ([place]) 01/22/2013  . Pyogenic arthritis of shoulder region ([place]) 01/15/2013  . Nephrolithiasis 01/23/2012  . Lumbar radiculopathy 02/28/2011  . Gout flare 04/30/2010  . Chronic pain 02/14/2010  . NASH (nonalcoholic steatohepatitis) 11/08/2008  . Obesity 10/27/2008  . Hypertriglyceridemia 10/27/2007  . DM (diabetes mellitus), type 2, uncontrolled ([place]) 03/12/2006  . Essential hypertension, benign 03/12/2006    Past Surgical History:  Procedure Laterality Date  . BACK SURGERY    . CARPAL TUNNEL RELEASE    . CHOLECYSTECTOMY    . FOOT SURGERY    . JOINT REPLACEMENT    . SHOULDER ARTHROSCOPY    . TOTAL KNEE ARTHROPLASTY    . 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 Heart failure Mother   . Cancer Father     Social History   Tobacco Use  . Smoking status: Never Smoker  . Smokeless tobacco: Never Used  Substance Use Topics  . Alcohol use: No  . Drug use: No    Home Medications Prior to Admission  medications   Medication Sig Start Date End Date Taking? Authorizing Provider  allopurinol (ZYLOPRIM) 300 MG tablet Take 300 mg by mouth daily. 02/19/19  Yes [provider]  amitriptyline (ELAVIL) 25 MG tablet Take 25 mg by mouth at bedtime. 03/11/19  Yes [provider]  amLODipine (NORVASC) 10 MG tablet Take 5 mg by mouth daily.  02/08/16  Yes [provider]  aspirin EC 81 MG tablet Take 81 mg by mouth daily.   Yes [provider]  furosemide (LASIX) 20 MG tablet Take 20 mg by mouth daily. 01/06/19  Yes [provider]  gabapentin  (NEURONTIN) 300 MG capsule Take 300 mg by mouth 3 (three) times daily. 03/16/19  Yes [provider]  LEVEMIR FLEXTOUCH 100 UNIT/ML Pen Inject 70 Units into the skin 2 (two) times daily.  10/08/13  Yes [provider]  lisinopril (PRINIVIL,ZESTRIL) 40 MG tablet Take 40 mg by mouth daily. 03/05/16  Yes [provider]  metoprolol succinate (TOPROL-XL) 100 MG 24 hr tablet Take 100 mg by mouth daily. 02/04/19  Yes [provider]  Oxycodone HCl 10 MG TABS Take 10 mg by mouth every 6 (six) hours. 02/19/19  Yes [provider]  doxycycline (VIBRA-TABS) 100 MG tablet Take 1 tablet (100 mg total) by mouth 2 (two) times daily. 04/09/19   Persons, Bevely Palmer, PA  predniSONE (DELTASONE) 10 MG tablet Take 3 tablets (30 mg total) by mouth daily with breakfast. 04/01/19   [name], MD    Allergies    Other, Pravastatin, Prednisone, and Toradol [ketorolac tromethamine]  Review of Systems   Review of Systems  Constitutional: Positive for fever (subjective today). Negative for appetite change, diaphoresis, fatigue and unexpected weight change.  HENT: Negative for mouth sores.   Eyes: Negative for visual disturbance.  Respiratory: Negative for cough, chest tightness, shortness of breath and wheezing.   Cardiovascular: Negative for chest pain.  Gastrointestinal: Negative for abdominal pain, constipation, diarrhea, nausea and vomiting.  Endocrine: Negative for polydipsia, polyphagia and polyuria.  Genitourinary: Negative for dysuria, frequency, hematuria and urgency.  Musculoskeletal: Negative for back pain and neck stiffness.  Skin: Positive for color change and wound. Negative for rash.  Allergic/Immunologic: Negative for immunocompromised state.  Neurological: Positive for syncope. Negative for light-headedness and headaches.  Hematological: Does not bruise/bleed easily.  Psychiatric/Behavioral: Negative for sleep disturbance. The patient is not  nervous/anxious.     Physical Exam Updated Vital Signs BP 115/83   Pulse (!) 102   Temp 98.4 F (36.9 C) (Oral)   Resp (!) 23   SpO2 92%   Physical Exam Vitals and nursing note reviewed.  Constitutional:      General: He is not in acute distress.    Appearance: He is not diaphoretic.  HENT:     Head: Normocephalic.  Eyes:     General: No scleral icterus.    Conjunctiva/sclera: Conjunctivae normal.  Cardiovascular:     Rate and Rhythm: Regular rhythm. Tachycardia present.     Pulses: Normal pulses.          Radial pulses are 2+ on the right side and 2+ on the left side.  Pulmonary:     Effort: No tachypnea, accessory muscle usage, prolonged expiration, respiratory distress or retractions.     Breath sounds: No stridor.     Comments: Equal chest rise. No increased work of breathing. Abdominal:     General: There is no distension.     Palpations: Abdomen is soft.  Tenderness: There is no abdominal tenderness. There is no guarding or rebound.  Musculoskeletal:     Cervical back: Normal range of motion.     Comments: Moves all extremities equally and without difficulty. See photos  Skin:    General: Skin is warm and dry.     Capillary Refill: Capillary refill takes less than 2 seconds.     Findings: Erythema present.  Neurological:     Mental Status: He is alert.     GCS: GCS eye subscore is 4. GCS verbal subscore is 5. GCS motor subscore is 6.     Comments: Speech is clear and goal oriented.  Psychiatric:        Mood and Affect: Mood normal.               ED Results / Procedures / Treatments   Labs (all labs ordered are listed, but only abnormal results are displayed) Labs Reviewed  LACTIC ACID, PLASMA - Abnormal; Notable for the following components:      Result Value   Lactic Acid, Venous 2.3 (*)    All other components within normal limits  LACTIC ACID, PLASMA - Abnormal; Notable for the following components:   Lactic Acid, Venous 3.3 (*)     All other components within normal limits  COMPREHENSIVE METABOLIC PANEL - Abnormal; Notable for the following components:   CO2 19 (*)    Glucose, Bld 215 (*)    AST 13 (*)    All other components within normal limits  CBC WITH DIFFERENTIAL/PLATELET - Abnormal; Notable for the following components:   WBC 13.4 (*)    RBC 3.17 (*)    Hemoglobin 9.8 (*)    HCT 31.0 (*)    RDW 17.3 (*)    Neutro Abs 11.9 (*)    Abs Immature Granulocytes 0.15 (*)    All other components within normal limits  URINALYSIS, ROUTINE W REFLEX MICROSCOPIC - Abnormal; Notable for the following components:   Glucose, UA >=500 (*)    Protein, ur 100 (*)    All other components within normal limits  TROPONIN I (HIGH SENSITIVITY) - Abnormal; Notable for the following components:   Troponin I (High Sensitivity) 26 (*)    All other components within normal limits  CULTURE, BLOOD (ROUTINE X 2)  CULTURE, BLOOD (ROUTINE X 2)  URINE CULTURE  SARS CORONAVIRUS 2 (TAT 6-24 HRS)  APTT  PROTIME-INR  TROPONIN I (HIGH SENSITIVITY)    EKG EKG Interpretation  Date/Time:  Sunday April 11 2019 02:39:28 EDT Ventricular Rate:  98 PR Interval:    QRS Duration: 89 QT Interval:  339 QTC Calculation: 433 R Axis:   -30 Text Interpretation: Sinus rhythm Left axis deviation Minimal ST elevation, anterior leads No significant change since last tracing Confirmed by Pryor Curia [phone]) on 04/11/2019 2:42:43 AM       Radiology DG Chest Port 1 View  Result Date: 04/11/2019 CLINICAL DATA:  Syncope. EXAM: PORTABLE CHEST 1 VIEW COMPARISON:  01/04/2019 FINDINGS: The cardiomediastinal contours are normal. The lungs are clear. Pulmonary vasculature is normal. No consolidation, pleural effusion, or pneumothorax. No acute osseous abnormalities are seen. IMPRESSION: Negative chest radiograph. Electronically Signed   By: Keith Rake M.D.   On: 04/11/2019 02:55    Procedures .Critical Care Performed by: Abigail Butts,  PA-C Authorized by: Abigail Butts, PA-C   Critical care provider statement:    Critical care time (minutes):  45   Critical care time was exclusive of:  Separately  billable procedures and treating other patients and teaching time   Critical care was necessary to treat or prevent imminent or life-threatening deterioration of the following conditions:  Sepsis   Critical care was time spent personally by me on the following activities:  Discussions with consultants, evaluation of patient's response to treatment, examination of patient, ordering and performing treatments and interventions, ordering and review of laboratory studies, ordering and review of radiographic studies, pulse oximetry, re-evaluation of patient's condition, obtaining history from patient or surrogate and review of old charts   I assumed direction of critical care for this patient from another provider in my specialty: no     (including critical care time)  Medications Ordered in ED Medications  vancomycin (VANCOREADY) IVPB 2000 mg/400 mL (2,000 mg Intravenous New Bag/Given 04/11/19 0315)  vancomycin (VANCOCIN) IVPB 1000 mg/200 mL premix (has no administration in time range)  ceFEPIme (MAXIPIME) 2 g in sodium chloride 0.9 % 100 mL IVPB (has no administration in time range)  lactated ringers bolus 1,000 mL (has no administration in time range)    And  lactated ringers bolus 1,000 mL (has no administration in time range)    And  lactated ringers bolus 1,000 mL (has no administration in time range)  sodium chloride flush (NS) 0.9 % injection 3 mL (3 mLs Intravenous Given 04/11/19 0240)  cefTRIAXone (ROCEPHIN) 2 g in sodium chloride 0.9 % 100 mL IVPB (0 g Intravenous Stopped 04/11/19 0314)  sodium chloride 0.9 % bolus 500 mL (0 mLs Intravenous Stopped 04/11/19 0353)  sodium chloride 0.9 % bolus 500 mL (500 mLs Intravenous New Bag/Given 04/11/19 0352)    ED Course  I have reviewed the triage vital signs and the nursing  notes.  Pertinent labs & imaging results that were available during my care of the patient were reviewed by me and considered in my medical decision making (see chart for details).  Clinical Course as of Apr 11 414  Sun Apr 11, 2019  0325 Patient tachycardic on arrival, tachypneic but without hypotension.  Rising lactic acid in the setting of infection of the left foot despite fluid administration.  Code sepsis initiated.  Antibiotics already given.  Patient will be admitted.   [HM]  0349 Lactic Acid, Venous(!!): 3.3 [HM]    Clinical Course User Index [HM] Eliberto Sole, Gwenlyn Perking   MDM Rules/Calculators/A&P                       Patient with worsening left foot infection.  He is status post metatarsal amputation 2 weeks ago by [name].  Records reviewed.  Patient followed up in the office 2 days ago and was prescribed doxycycline which she has not taken or filled.  He is tachycardic on arrival and mild tachypnea.  Leukocytosis of greater than 13,000.  Hyperglycemic at 215.  Patient reports baseline glucose is 90/100.  Initial lactic acid 2.3.  Fluids given along with antibiotics.  Repeat at 3.3.  Given rising lactic acid, patient will need to be admitted for sepsis and IV antibiotics.  Additionally, patient complains of syncope tonight.  EKG is without ischemia.  Troponin slightly elevated at 26 however no previous to compare to.  Patient is without chest pain or shortness of breath.  Reports he feels otherwise well.  Doubt acute coronary syndrome at this time.  The patient was discussed with Dr. Leonides Schanz who agrees with the treatment plan.  Previous cultures showed Enterobacter.  Patient was not discharged home on antibiotics  because orthopedics felt this was a chronic wound.  Patient initially given Rocephin and vancomycin however Enterobacter is resistant to Rocephin.  Will give cefepime instead.  4:15 AM Discussed patient's case with hospitalist, Dr. Cyd Silence.  I have recommended  admission and patient (and family if present) agree with this plan. Admitting physician will place admission orders.  He recommends 17m/kg fluid resuscitation given rising lactic.  We will add additional fluid to patient's initial 1 L.  Final Clinical Impression(s) / ED Diagnoses Final diagnoses:  Wound infection  Hyperglycemia  Sepsis, due to unspecified organism, unspecified whether acute organ dysfunction present ([facility]    Rx / DMoraOrders ED Discharge Orders    None       Porscha Axley, HGwenlyn Perking03/28/21 0416    Dyane Broberg, HJarrett Soho PA-C 04/11/19 0[place] KDelice Bison DO 04/11/19 [phone]

## 2019-04-11 NOTE — ED Notes (Signed)
Admitting at bedside 

## 2019-04-11 NOTE — ED Notes (Signed)
Patient transported to MRI 

## 2019-04-11 NOTE — ED Notes (Signed)
Pt refused rectal temperature.

## 2019-04-11 NOTE — Progress Notes (Signed)
Patient presented for concern of TMA infection after surgery approximately 2 weeks ago. See HPI from H&P for full details.   He had blood cultures drawn in the ED with noted lactate of 3.3 concerning for sepsis secondary to possible TMA infection. He was started on vancmoycin and cefepime and will continue these for now. Have consulted orthopedics and appreciate their assistance.  Pharmacy to assist with dosing of vancomycin. Will have repeat CBC and CMP in the morning.

## 2019-04-11 NOTE — Consult Note (Signed)
[facility] Nurse Consult Note: Reason for Consult: Dehisced surgical wound Wound type: Surgical, infectious vs trauma  WOC Nursing is simultaneously consulted with Orthopedics.  Ortho (Dr. Erlinda Hong) has seen today.  Plan for antibiotics, elevation.  [name] to see in a few days.  [facility] nursing team will not follow, but will remain available to this patient, the nursing and medical teams.  Please re-consult if needed. Thanks, Maudie Flakes, MSN, RN, [place], Arther Abbott  Pager# [phone]

## 2019-04-11 NOTE — ED Triage Notes (Signed)
Pt arrives to ED w/ c/o possible wound infection R after partial foot amputation surgery two weeks ago. Pt states he was prescribed ABX but unable to get them. R foot is red w/ mild edema. Pt denies fevers. Pt states he had a period of time today in which he "blacked out" states he is unsure whether or not he lost consciousness. Pt states this lasted 30-45 mins.

## 2019-04-11 NOTE — ED Notes (Signed)
Breakfast ordered 

## 2019-04-11 NOTE — Progress Notes (Signed)
Bedside RN [employer] quickly responded to by secure chat of need for another LA

## 2019-04-11 NOTE — Consult Note (Signed)
ORTHOPAEDIC CONSULTATION  REQUESTING PHYSICIAN: Shalhoub, Sherryll Burger, MD  Chief Complaint: r/o wound infection s/p left TMA  HPI: [name] is a 64 y.o. male who presents with bloody drainage from left TMA incision.  He's had 5 days of increasing left foot pain.  Complaining of poor appetite and difficulty with ambulation.  Presented to ED last night with lactic acidosis and leukocytosis.  Ortho consulted.  Past Medical History:  Diagnosis Date  . Arthritis   . Back pain   . Diabetes mellitus without complication ([place])   . Diabetic foot ulcer ([place]) 03/2019  . Hypertension    Past Surgical History:  Procedure Laterality Date  . BACK SURGERY    . CARPAL TUNNEL RELEASE    . CHOLECYSTECTOMY    . FOOT SURGERY    . JOINT REPLACEMENT    . SHOULDER ARTHROSCOPY    . TOTAL KNEE ARTHROPLASTY    . 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 Marital status: Single    Spouse name: Not on file  . Number of children: Not on file  . Years of education: Not on file  . Highest education level: Not on file  Occupational History  . Not on file  Tobacco Use  . Smoking status: Never Smoker  . Smokeless tobacco: Never Used  Substance and Sexual Activity  . Alcohol use: No  . Drug use: No  . Sexual activity: Not on file  Other Topics Concern  . Not on file  Social History Narrative  . Not on file   Social Determinants of Health   Financial Resource Strain:   . Difficulty of Paying Living Expenses:   Food Insecurity:   . Worried About Charity fundraiser in the Last Year:   . Arboriculturist in the Last Year:   Transportation Needs:   . Film/video editor (Medical):   Marland Kitchen Lack of Transportation (Non-Medical):   Physical Activity:   . Days of Exercise per Week:   . Minutes of Exercise per  Session:   Stress:   . Feeling of Stress :   Social Connections:   . Frequency of Communication with Friends and Family:   . Frequency of Social Gatherings with Friends and Family:   . Attends Religious Services:   . Active Member of Clubs or Organizations:   . Attends Archivist Meetings:   Marland Kitchen Marital Status:    Family History  Problem Relation Age of Onset  . Heart failure Mother   . Cancer Father    - negative except otherwise stated in the family history section Allergies  Allergen Reactions  . Other Hives    Anti-snake venim for copperhead venom.  . Pravastatin Other (See Comments)    Leg cramps, elevated CK  . Prednisone Other (See Comments)    Patient states "it runs my blood sugar up so I don't take it."  . Toradol [Ketorolac Tromethamine] Hives and Nausea And Vomiting    Pill form only   Prior to Admission medications   Medication Sig Start Date End Date Taking? Authorizing Provider  allopurinol (ZYLOPRIM) 300 MG tablet Take 300 mg by mouth daily. 02/19/19  Yes [provider]  amitriptyline (ELAVIL) 25 MG tablet Take 25 mg by mouth at bedtime. 03/11/19  Yes [provider]  amLODipine (  NORVASC) 10 MG tablet Take 5 mg by mouth daily.  02/08/16  Yes [provider]  aspirin EC 81 MG tablet Take 81 mg by mouth daily.   Yes [provider]  furosemide (LASIX) 20 MG tablet Take 20 mg by mouth daily. 01/06/19  Yes [provider]  gabapentin (NEURONTIN) 300 MG capsule Take 300 mg by mouth 3 (three) times daily. 03/16/19  Yes [provider]  LEVEMIR FLEXTOUCH 100 UNIT/ML Pen Inject 70 Units into the skin 2 (two) times daily.  10/08/13  Yes [provider]  lisinopril (PRINIVIL,ZESTRIL) 40 MG tablet Take 40 mg by mouth daily. 03/05/16  Yes [provider]  metoprolol succinate (TOPROL-XL) 100 MG 24 hr tablet Take 100 mg by mouth daily. 02/04/19  Yes [provider]  Oxycodone HCl 10 MG TABS Take  10 mg by mouth every 6 (six) hours. 02/19/19  Yes [provider]  doxycycline (VIBRA-TABS) 100 MG tablet Take 1 tablet (100 mg total) by mouth 2 (two) times daily. 04/09/19   Persons, Bevely Palmer, PA  predniSONE (DELTASONE) 10 MG tablet Take 3 tablets (30 mg total) by mouth daily with breakfast. 04/01/19   [name], MD   MR FOOT LEFT WO CONTRAST  Result Date: 04/11/2019 CLINICAL DATA:  Left foot osteomyelitis. Prior amputation. EXAM: MRI OF THE LEFT FOOT WITHOUT CONTRAST TECHNIQUE: Multiplanar, multisequence MR imaging of the left foot was performed. No intravenous contrast was administered. COMPARISON:  X-ray left foot 03/25/2019, 03/29/2017 FINDINGS: Bones/Joint/Cartilage Prior transmetatarsal amputation of left foot through the proximal-mid diaphyses. Soft tissue wound at the amputation stump distally. Complex fluid collection with heterogeneous T1 and T2 hyperintensity in the soft tissues measuring approximately 5.9 x 1.8 x 2 cm draining through the wound. Marrow edema within the stumps of the first through fifth metatarsals most severe in the second and fifth metatarsals which may reflect reactive edema given recent surgery versus osteomyelitis. No acute fracture or dislocation. Mild osteoarthritis of the talonavicular joint, calcaneocuboid joint, third TMT joint and fourth TMT joint. Subcortical marrow edema which is likely reactive along the medial aspect of the talus adjacent to the course of the posterior tibial tendon. Ligaments Lisfranc ligament is intact. Muscles and Tendons Severe tendinosis and a partial-thickness tear of the posterior tibial tendon. Intact flexor hallucis longus and flexor digitorum tendons. Mild tendinosis of the Achilles tendon with a small amount of fluid in the retrocalcaneal bursa. Mild tendinosis of the peroneus longus and peroneus brevis tendons. Extensor compartment tendons are intact. Generalized muscle atrophy. Soft tissue No fluid collection or hematoma.  No soft tissue mass. Soft tissue wound at the amputation stump distally. Complex fluid collection in the soft tissues measuring approximately 5.9 x 1.8 x 2 cm draining through the wound. IMPRESSION: 1. Prior transmetatarsal amputation of the left foot through the proximal-mid diaphyses. Complex fluid collection in the soft tissues measuring approximately 5.9 x 1.8 x 2 cm draining through the wound concerning for a hematoma, but a secondarily infected hematoma or concomitant abscess cannot be excluded. Marrow edema within the stumps of the first through fifth metatarsals most severe in the second and fifth metatarsals which may reflect reactive edema given recent surgery versus osteomyelitis. 2. Severe tendinosis and a partial-thickness tear of the posterior tibial tendon with adjacent marrow edema in the medial talus. 3. Mild tendinosis of the Achilles tendon with a small amount of fluid in the retrocalcaneal bursa. Electronically Signed   By: Kathreen Devoid   On: 04/11/2019 09:02  DG Chest Port 1 View  Result Date: 04/11/2019 CLINICAL DATA:  Syncope. EXAM: PORTABLE CHEST 1 VIEW COMPARISON:  01/04/2019 FINDINGS: The cardiomediastinal contours are normal. The lungs are clear. Pulmonary vasculature is normal. No consolidation, pleural effusion, or pneumothorax. No acute osseous abnormalities are seen. IMPRESSION: Negative chest radiograph. Electronically Signed   By: Keith Rake M.D.   On: 04/11/2019 02:55   - pertinent xrays, CT, MRI studies were reviewed and independently interpreted  Positive ROS: All other systems have been reviewed and were otherwise negative with the exception of those mentioned in the HPI and as above.  Physical Exam: General: No acute distress Cardiovascular: No pedal edema Respiratory: No cyanosis, no use of accessory musculature GI: No organomegaly, abdomen is soft and non-tender Skin: No lesions in the area of chief complaint Neurologic: Sensation intact  distally Psychiatric: Patient is at baseline mood and affect Lymphatic: No axillary or cervical lymphadenopathy  MUSCULOSKELETAL:        Hemostatic, surrounding cellulitis, no fluctuance  Assessment: S/p left TMA with cellulitis, possible infection  Plan: - difficult to discern infection vs post surgical reactive changes based on MRI - would recommend elevation, bedrest, IV abx for now and follow his response - will have [name] reevaluate in the next couple of days  Thank you for the consult and the opportunity to see Mr. Kairos Panetta. Eduard Roux, MD [facility] 10:16 AM

## 2019-04-12 ENCOUNTER — Encounter (HOSPITAL_BASED_OUTPATIENT_CLINIC_OR_DEPARTMENT_OTHER): Payer: Medicaid Other | Admitting: Internal Medicine

## 2019-04-12 DIAGNOSIS — M86172 Other acute osteomyelitis, left ankle and foot: Secondary | ICD-10-CM

## 2019-04-12 LAB — COMPREHENSIVE METABOLIC PANEL
ALT: 10 U/L (ref 0–44)
AST: 11 U/L — ABNORMAL LOW (ref 15–41)
Albumin: 3 g/dL — ABNORMAL LOW (ref 3.5–5.0)
Alkaline Phosphatase: 53 U/L (ref 38–126)
Anion gap: 9 (ref 5–15)
BUN: 11 mg/dL (ref 8–23)
CO2: 25 mmol/L (ref 22–32)
Calcium: 9.2 mg/dL (ref 8.9–10.3)
Chloride: 107 mmol/L (ref 98–111)
Creatinine, Ser: 1.01 mg/dL (ref 0.61–1.24)
GFR calc Af Amer: >60 mL/min (ref >60)
GFR calc non Af Amer: >60 mL/min (ref >60)
Glucose, Bld: 141 mg/dL — ABNORMAL HIGH (ref 70–99)
Potassium: 4.3 mmol/L (ref 3.5–5.1)
Sodium: 141 mmol/L (ref 135–145)
Total Bilirubin: 0.6 mg/dL (ref 0.3–1.2)
Total Protein: 6.3 g/dL — ABNORMAL LOW (ref 6.5–8.1)

## 2019-04-12 LAB — CBC WITH DIFFERENTIAL/PLATELET
Abs Immature Granulocytes: 0.06 10*3/uL (ref 0.00–0.07)
Basophils Absolute: 0 10*3/uL (ref 0.0–0.1)
Basophils Relative: 0 %
Eosinophils Absolute: 0.2 10*3/uL (ref 0.0–0.5)
Eosinophils Relative: 3 %
HCT: 27.5 % — ABNORMAL LOW (ref 39.0–52.0)
Hemoglobin: 8.8 g/dL — ABNORMAL LOW (ref 13.0–17.0)
Immature Granulocytes: 1 %
Lymphocytes Relative: 27 %
Lymphs Abs: 1.7 10*3/uL (ref 0.7–4.0)
MCH: 30.7 pg (ref 26.0–34.0)
MCHC: 32 g/dL (ref 30.0–36.0)
MCV: 95.8 fL (ref 80.0–100.0)
Monocytes Absolute: 0.4 10*3/uL (ref 0.1–1.0)
Monocytes Relative: 6 %
Neutro Abs: 3.9 10*3/uL (ref 1.7–7.7)
Neutrophils Relative %: 63 %
Platelets: 123 10*3/uL — ABNORMAL LOW (ref 150–400)
RBC: 2.87 MIL/uL — ABNORMAL LOW (ref 4.22–5.81)
RDW: 17.2 % — ABNORMAL HIGH (ref 11.5–15.5)
WBC: 6.2 10*3/uL (ref 4.0–10.5)
nRBC: 0 % (ref 0.0–0.2)

## 2019-04-12 LAB — URINE CULTURE: Culture: NO GROWTH

## 2019-04-12 LAB — PROTIME-INR
INR: 0.9 (ref 0.8–1.2)
Prothrombin Time: 12.4 seconds (ref 11.4–15.2)

## 2019-04-12 LAB — PROCALCITONIN: Procalcitonin: <0.1 ng/mL

## 2019-04-12 LAB — GLUCOSE, CAPILLARY
Glucose-Capillary: 104 mg/dL — ABNORMAL HIGH (ref 70–99)
Glucose-Capillary: 146 mg/dL — ABNORMAL HIGH (ref 70–99)
Glucose-Capillary: 117 mg/dL — ABNORMAL HIGH (ref 70–99)
Glucose-Capillary: 127 mg/dL — ABNORMAL HIGH (ref 70–99)

## 2019-04-12 MED ORDER — COLCHICINE 0.6 MG PO TABS
0.6000 mg | ORAL_TABLET | Freq: Every day | ORAL | Status: DC
  Administered 2019-04-12 – 2019-04-15 (×4): 0.6 mg via ORAL
  Filled 2019-04-12 (×4): qty 1

## 2019-04-12 MED ORDER — HYDROMORPHONE HCL 1 MG/ML IJ SOLN
0.5000 mg | INTRAMUSCULAR | Status: DC | PRN
  Administered 2019-04-12 – 2019-04-14 (×8): 0.5 mg via INTRAVENOUS
  Filled 2019-04-12 (×8): qty 1

## 2019-04-12 MED ORDER — OXYCODONE HCL 5 MG PO TABS
10.0000 mg | ORAL_TABLET | ORAL | Status: DC | PRN
  Administered 2019-04-12 – 2019-04-15 (×11): 10 mg via ORAL
  Filled 2019-04-12 (×11): qty 2

## 2019-04-12 MED ORDER — INDOMETHACIN 25 MG PO CAPS
50.0000 mg | ORAL_CAPSULE | Freq: Two times a day (BID) | ORAL | Status: AC
  Administered 2019-04-12: 50 mg via ORAL
  Filled 2019-04-12: qty 2

## 2019-04-12 MED ORDER — FENTANYL CITRATE (PF) 100 MCG/2ML IJ SOLN
25.0000 ug | Freq: Once | INTRAMUSCULAR | Status: AC
  Administered 2019-04-12: 25 ug via INTRAVENOUS
  Filled 2019-04-12: qty 2

## 2019-04-12 NOTE — Plan of Care (Signed)
  Problem: Pain Managment: Goal: General experience of comfort will improve Outcome: Progressing   Problem: Safety: Goal: Ability to remain free from injury will improve Outcome: Progressing   Problem: Skin Integrity: Goal: Risk for impaired skin integrity will decrease Outcome: Progressing   

## 2019-04-12 NOTE — Progress Notes (Signed)
Patient feeling better. Feels foot is less red. 16 days s/p Left TMA. Patient has been seen 2x in office since release. At 1st visit there was noted to be a large amount of clotted blood when wound vac removed. Patient seen by me on Friday.Had some increased pain , erythema but only bloody drainage. No odor or purulent drainage. Denied fever or chills. Patient was placed on doxycycline which he was unable to obtain and encouraged to elevate foot. Presented to ER with findings consistent with sepsis   Left Foot: Moderate amount of soft tissues swelling. Erythema adjacent to dorsum of foot but does not extend to midfoot. No purulent drainage or foul odor. Some noted wound dehiscence. Strong Dorsalis pedis pulse.  MRI difficult to discern between post op  Changes vs osteomyelitis. Given history May be infected hemetoma.   [name] will return tomorrow. Discussed with patient He may place him on for I and D with wound revision on WED .

## 2019-04-12 NOTE — Progress Notes (Signed)
VAST consulted to place new IV access at request of patient. Current IV in LAC tender and pt wants IV where he can use his arm. Spoke to patient about an USGIV vs a midline. He wanted the USGIV. Once line placed, notified pt's nurse, Lexi of need for new tubing and need to dc current IV once fluids switched over. Also notified of pt's request for pain medication. Lexi, RN verbalized understanding.

## 2019-04-12 NOTE — Progress Notes (Signed)
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 who has again presented to [facility] emergency department with worsening redness swelling and pain of the left foot.  Wound culture during the hospitalization grew out Enterobacter aerogenes  Patient explains that approximately 5 days ago he began to develop recurrent pain of the left foot.  Pain is throbbing in quality, moderate to severe in intensity nonradiating and worse with weightbearing or movement of the affected extremity.  As patient's pain worsened over the next several days this was associated with increasing redness and swelling of the left foot.  Patient is complaining of associated poor appetite and difficulty with ambulation.   Assessment & Plan:   Principal Problem:   Acute osteomyelitis of left foot (HCC) Active Problems:   Uncontrolled type 2 diabetes mellitus with hyperglycemia, with long-term current use of insulin (HCC)   Acute gout of right knee   Lactic acidosis   Sepsis due to Enterobacter [facility])  Acute osteomyelitis and cellulitis of left foot (HCC) -Met SIRS criteria on admission and this is improving with broad spectrum antibiotics -Orthopedics on board and Dr Sharol Given to see tomorrow for consideration of I and D -Continue Vancomycin/Cefepime for now -MRI foot with changes could be post-op or infectious -blood cultures pending  Lactic acidosis: -got IVF and trending down -related to foot infection -see treatment for infection    Sepsis due to  Enterobacter ([place]) -see above    Uncontrolled type 2 diabetes mellitus with hyperglycemia, with long-term current use of insulin ([place]) -Patient states that he frequently has low blood sugar as of late and therefore we will temporarily place patient on a reduced regimen of Levemir 35 units twice daily -CBG monitoring before every meal and nightly with sliding scale insulin -Recent hemoglobin A1c of 7.2 reveals reasonable outpatient management -Diabetic diet    Acute gout of right knee -colchicine ordered for daily -given ongoing infection on foot will not do prednisone at this time  DVT prophylaxis: Lovenox Code Status:full Family Communication: patient only Disposition Plan:  . Patient came from:home            . Anticipated d/c place:home . Barriers to d/c OR conditions which need to be met to effect a safe d/c: needs evaluation with [name] and potential I and D to left foot   Consultants:   Orthopedics  Procedures:  none  Antimicrobials:   Vancomycin start date 04/11/19  Cefepime start date 04/11/19   Subjective: Feeling okay but right knee hurting with his gout. He is not being given anything for that and is concerned. Left foot stable and he understands recent MRI results will see Sharol Given tomorrow. Concerned as foot still bleeds some. No chest pain or SOB or abdominal problems.   Objective: Vitals:   04/11/19 2014 04/11/19 2333 04/12/19 0325 04/12/19 0726  BP: 122/69 131/78 (!) 145/80 (!) 149/90  Pulse: 82 86 81 96  Resp: 17 17 17 16   Temp: 98.7 F (37.1 C) 98.7 F (37.1 C) 98 F (36.7 C) 98.9 F (37.2 C)  TempSrc: Oral Oral Oral Oral  SpO2: 92% 91% 92% 94%  Weight:      Height:        Intake/Output Summary (Last 24 hours) at 04/12/2019 1219 Last data filed at 04/12/2019 3491 Gross per 24 hour  Intake --  Output 3750 ml  Net -3750 ml   Filed Weights   04/11/19 0353  Weight: 117.9 kg    Examination:  General exam: Appears calm and comfortable   Respiratory system: Clear to auscultation. Respiratory effort normal. Cardiovascular system: S1 & S2 heard, RRR. No JVD, murmurs, rubs, gallops or clicks. No pedal edema. Gastrointestinal system: Abdomen is nondistended, soft and nontender. No organomegaly or masses felt. Normal bowel sounds heard. Central nervous system: Alert and oriented. No focal neurological deficits. Extremities: left foot with wound, some dehiscence and erythema at the dorsum not to midfoot, no discharge, some blood spots on sheets around foot area dried Skin: No rashes, lesions or ulcers Psychiatry: Judgement and insight appear normal. Mood & affect appropriate.   Data Reviewed: I have personally reviewed following labs and imaging studies  CBC: Recent Labs  Lab 04/11/19 0032 04/12/19 0447  WBC 13.4* 6.2  NEUTROABS 11.9* 3.9  HGB 9.8* 8.8*  HCT 31.0* 27.5*  MCV 97.8 95.8  PLT 173 791*   Basic Metabolic Panel: Recent Labs  Lab 04/11/19 0032 04/12/19 0447  NA 138 141  K 4.1 4.3  CL 108 107  CO2 19* 25  GLUCOSE 215* 141*  BUN 13 11  CREATININE 1.04 1.01  CALCIUM 9.2 9.2   GFR: Estimated Creatinine Clearance: 104.8 mL/min (by C-G formula based on SCr of 1.01 mg/dL). Liver Function Tests: Recent Labs  Lab 04/11/19 0032 04/12/19 0447  AST 13* 11*  ALT 10 10  ALKPHOS 62 53  BILITOT 0.7 0.6  PROT 7.4 6.3*  ALBUMIN 3.5 3.0*   No results for input(s): LIPASE, AMYLASE in the last 168 hours. No results for input(s): AMMONIA in the last 168 hours. Coagulation Profile: Recent Labs  Lab 04/11/19 0236 04/12/19 0447  INR 1.1 0.9   Cardiac Enzymes: No results for input(s): CKTOTAL, CKMB, CKMBINDEX, TROPONINI in the last 168 hours. BNP (last 3 results) No results for input(s): PROBNP in the last 8760 hours. HbA1C: No results for input(s): HGBA1C in the last 72 hours. CBG: Recent Labs  Lab 04/11/19 1137 04/11/19 1714 04/11/19 2012 04/12/19 0643 04/12/19 1116  GLUCAP 173* 98 132* 104*  146*   Lipid Profile: No results for input(s): CHOL, HDL, LDLCALC, TRIG, CHOLHDL, LDLDIRECT in the last 72 hours. Thyroid Function Tests: No results for input(s): TSH, T4TOTAL, FREET4, T3FREE, THYROIDAB in the last 72 hours. Anemia Panel: No results for input(s): VITAMINB12, FOLATE, FERRITIN, TIBC, IRON, RETICCTPCT in the last 72 hours. Sepsis Labs: Recent Labs  Lab 04/11/19 0031 04/11/19 0216 04/11/19 0512 04/11/19 1617 04/12/19 0447  PROCALCITON  --   --   --   --  <0.10  LATICACIDVEN 2.3* 3.3* 1.7 1.1  --     Recent Results (from the past 240 hour(s))  Blood Culture (routine x 2)     Status: None (Preliminary result)   Collection Time: 04/11/19  2:16 AM   Specimen: BLOOD  Result Value Ref Range Status   Specimen Description BLOOD LEFT ARM  Final   Special Requests   Final    BOTTLES DRAWN AEROBIC AND ANAEROBIC Blood Culture results may not be optimal due to an excessive volume of blood received in culture bottles Performed at [facility]  [address], [place], [place] [address]    Culture NO GROWTH 1 DAY  Final   Report Status PENDING  Incomplete  Blood Culture (routine x 2)     Status: None (Preliminary result)   Collection Time: 04/11/19  2:59 AM   Specimen: BLOOD  Result Value Ref Range Status   Specimen Description BLOOD RIGHT HAND  Final   Special Requests   Final    BOTTLES DRAWN AEROBIC AND ANAEROBIC Blood Culture adequate volume Performed at [facility], [place] [address], [place], [place] [address]    Culture NO GROWTH 1 DAY  Final   Report Status PENDING  Incomplete  SARS CORONAVIRUS 2 (TAT 6-24 HRS) Nasopharyngeal Nasopharyngeal Swab     Status: None   Collection Time: 04/11/19  3:56 AM   Specimen: Nasopharyngeal Swab  Result Value Ref Range Status   SARS Coronavirus 2 NEGATIVE NEGATIVE Final    Comment: (NOTE) SARS-CoV-2 target nucleic acids are NOT DETECTED. The SARS-CoV-2 RNA is generally detectable in upper and lower respiratory  specimens during the acute phase of infection. Negative results do not preclude SARS-CoV-2 infection, do not rule out co-infections with other pathogens, and should not be used as the sole basis for treatment or other patient management decisions. Negative results must be combined with clinical observations, patient history, and epidemiological information. The expected result is Negative. Fact Sheet for Patients: SugarRoll.be Fact Sheet for Healthcare Providers: https://www.woods-mathews.com/ This test is not yet approved or cleared by the [place] FDA and  has been authorized for detection and/or diagnosis of SARS-CoV-2 by FDA under an Emergency Use Authorization (EUA). This EUA will remain  in effect (meaning this test can be used) for the duration of the COVID-19 declaration under Section 56 4(b)(1) of the Act, 21 U.S.C. section 360bbb-3(b)(1), unless the authorization is terminated or revoked sooner. Performed at [facility], [place] [address] [place], [place] [address]   Urine culture     Status: None   Collection Time: 04/11/19  6:52 AM   Specimen: In/Out Cath Urine  Result Value Ref Range Status   Specimen Description IN/OUT CATH URINE  Final   Special Requests NONE  Final   Culture   Final    NO GROWTH Performed at [facility], [place] [address] [place], [place] [address]    Report Status 04/12/2019 FINAL  Final    Radiology Studies: MR FOOT LEFT WO CONTRAST  Result Date: 04/11/2019 CLINICAL DATA:  Left foot osteomyelitis. Prior amputation. EXAM: MRI OF THE LEFT FOOT WITHOUT CONTRAST TECHNIQUE: Multiplanar, multisequence MR imaging of the left foot was performed. No intravenous contrast was administered. COMPARISON:  X-ray left foot 03/25/2019, 03/29/2017 FINDINGS: Bones/Joint/Cartilage Prior transmetatarsal amputation of left foot through the proximal-mid diaphyses. Soft tissue wound at the amputation stump distally.  Complex fluid collection with heterogeneous T1 and T2 hyperintensity in the soft tissues measuring approximately 5.9 x 1.8 x 2 cm draining through the wound. Marrow edema within the stumps of the first through fifth metatarsals most severe in the second and fifth metatarsals which may reflect reactive edema given recent surgery versus osteomyelitis. No acute fracture or dislocation. Mild osteoarthritis of the talonavicular joint, calcaneocuboid joint, third TMT joint and fourth TMT joint. Subcortical marrow edema which is likely reactive along the medial aspect of the talus adjacent to the course of the posterior tibial tendon. Ligaments Lisfranc ligament is intact. Muscles and Tendons Severe tendinosis and a partial-thickness tear of the posterior tibial tendon. Intact flexor  hallucis longus and flexor digitorum tendons. Mild tendinosis of the Achilles tendon with a small amount of fluid in the retrocalcaneal bursa. Mild tendinosis of the peroneus longus and peroneus brevis tendons. Extensor compartment tendons are intact. Generalized muscle atrophy. Soft tissue No fluid collection or hematoma. No soft tissue mass. Soft tissue wound at the amputation stump distally. Complex fluid collection in the soft tissues measuring approximately 5.9 x 1.8 x 2 cm draining through the wound. IMPRESSION: 1. Prior transmetatarsal amputation of the left foot through the proximal-mid diaphyses. Complex fluid collection in the soft tissues measuring approximately 5.9 x 1.8 x 2 cm draining through the wound concerning for a hematoma, but a secondarily infected hematoma or concomitant abscess cannot be excluded. Marrow edema within the stumps of the first through fifth metatarsals most severe in the second and fifth metatarsals which may reflect reactive edema given recent surgery versus osteomyelitis. 2. Severe tendinosis and a partial-thickness tear of the posterior tibial tendon with adjacent marrow edema in the medial talus. 3.  Mild tendinosis of the Achilles tendon with a small amount of fluid in the retrocalcaneal bursa. Electronically Signed   By: Kathreen Devoid   On: 04/11/2019 09:02   DG Chest Port 1 View  Result Date: 04/11/2019 CLINICAL DATA:  Syncope. EXAM: PORTABLE CHEST 1 VIEW COMPARISON:  01/04/2019 FINDINGS: The cardiomediastinal contours are normal. The lungs are clear. Pulmonary vasculature is normal. No consolidation, pleural effusion, or pneumothorax. No acute osseous abnormalities are seen. IMPRESSION: Negative chest radiograph. Electronically Signed   By: Keith Rake M.D.   On: 04/11/2019 02:55   Scheduled Meds: . allopurinol  300 mg Oral Daily  . amitriptyline  25 mg Oral QHS  . amLODipine  5 mg Oral Daily  . colchicine  0.6 mg Oral Daily  . enoxaparin (LOVENOX) injection  40 mg Subcutaneous Q24H  . gabapentin  300 mg Oral TID  . insulin aspart  0-15 Units Subcutaneous TID AC & HS  . insulin detemir  35 Units Subcutaneous BID  . lisinopril  40 mg Oral Daily  . metoprolol succinate  100 mg Oral Daily   Continuous Infusions: . ceFEPime (MAXIPIME) IV 2 g (04/12/19 2863)  . vancomycin 1,000 mg (04/12/19 1156)    LOS: 1 day   Time spent: [place], MD [facility]  To contact the attending provider between 7A-7P or the covering provider during after hours 7P-7A, please log into the web site [email or web address] and access using universal [place] password for that web site. If you do not have the password, please call the hospital operator.  04/12/2019, 12:19 PM

## 2019-04-12 NOTE — Progress Notes (Signed)
Notified [name] on call for [facility] that pt is  c/o severe pain to right knee-states from gout. Colchicine not working per pt. Req Indocin 83m 3xd at that pt took for relief at home. States pain is a 10/10. RN will continue to monitor.

## 2019-04-12 NOTE — Plan of Care (Signed)
  Problem: Education: Goal: Knowledge of General Education information will improve Description: Including pain rating scale, medication(s)/side effects and non-pharmacologic comfort measures Outcome: Progressing   Problem: Clinical Measurements: Goal: Respiratory complications will improve Outcome: Progressing Note: On room air   Problem: Nutrition: Goal: Adequate nutrition will be maintained Outcome: Progressing   Problem: Coping: Goal: Level of anxiety will decrease Outcome: Progressing   Problem: Elimination: Goal: Will not experience complications related to urinary retention Outcome: Progressing   Problem: Pain Managment: Goal: General experience of comfort will improve Outcome: Progressing Note: Treated for pain with oxycodone and dilaudid off and on all day   Problem: Safety: Goal: Ability to remain free from injury will improve Outcome: Progressing

## 2019-04-13 ENCOUNTER — Encounter (HOSPITAL_BASED_OUTPATIENT_CLINIC_OR_DEPARTMENT_OTHER): Payer: Medicaid Other | Admitting: Internal Medicine

## 2019-04-13 DIAGNOSIS — L089 Local infection of the skin and subcutaneous tissue, unspecified: Secondary | ICD-10-CM

## 2019-04-13 DIAGNOSIS — T148XXA Other injury of unspecified body region, initial encounter: Secondary | ICD-10-CM

## 2019-04-13 DIAGNOSIS — M10061 Idiopathic gout, right knee: Secondary | ICD-10-CM

## 2019-04-13 LAB — CBC
HCT: 26.7 % — ABNORMAL LOW (ref 39.0–52.0)
Hemoglobin: 8.6 g/dL — ABNORMAL LOW (ref 13.0–17.0)
MCH: 30.6 pg (ref 26.0–34.0)
MCHC: 32.2 g/dL (ref 30.0–36.0)
MCV: 95 fL (ref 80.0–100.0)
Platelets: 129 10*3/uL — ABNORMAL LOW (ref 150–400)
RBC: 2.81 MIL/uL — ABNORMAL LOW (ref 4.22–5.81)
RDW: 16.8 % — ABNORMAL HIGH (ref 11.5–15.5)
WBC: 5.3 10*3/uL (ref 4.0–10.5)
nRBC: 0 % (ref 0.0–0.2)

## 2019-04-13 LAB — BASIC METABOLIC PANEL
Anion gap: 10 (ref 5–15)
BUN: 14 mg/dL (ref 8–23)
CO2: 22 mmol/L (ref 22–32)
Calcium: 9 mg/dL (ref 8.9–10.3)
Chloride: 106 mmol/L (ref 98–111)
Creatinine, Ser: 0.95 mg/dL (ref 0.61–1.24)
GFR calc Af Amer: >60 mL/min (ref >60)
GFR calc non Af Amer: >60 mL/min (ref >60)
Glucose, Bld: 144 mg/dL — ABNORMAL HIGH (ref 70–99)
Potassium: 4.2 mmol/L (ref 3.5–5.1)
Sodium: 138 mmol/L (ref 135–145)

## 2019-04-13 LAB — GLUCOSE, CAPILLARY
Glucose-Capillary: 168 mg/dL — ABNORMAL HIGH (ref 70–99)
Glucose-Capillary: 168 mg/dL — ABNORMAL HIGH (ref 70–99)
Glucose-Capillary: 159 mg/dL — ABNORMAL HIGH (ref 70–99)
Glucose-Capillary: 166 mg/dL — ABNORMAL HIGH (ref 70–99)

## 2019-04-13 LAB — URIC ACID: Uric Acid, Serum: 5.6 mg/dL (ref 3.7–8.6)

## 2019-04-13 MED ORDER — SODIUM CHLORIDE 0.9 % IV SOLN
INTRAVENOUS | Status: DC | PRN
  Administered 2019-04-13 – 2019-04-14 (×2): 1000 mL via INTRAVENOUS

## 2019-04-13 MED ORDER — INDOMETHACIN 25 MG PO CAPS
50.0000 mg | ORAL_CAPSULE | Freq: Two times a day (BID) | ORAL | Status: DC
  Administered 2019-04-13 – 2019-04-15 (×5): 50 mg via ORAL
  Filled 2019-04-13 (×6): qty 2

## 2019-04-13 NOTE — Progress Notes (Signed)
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 Wound culture during the hospitalization grew out Enterobacter aerogenes  Patient explains that approximately 5 days ago he began to develop recurrent pain of the left foot. Pain is throbbing in quality, moderate to severe in intensity nonradiating and worse with weightbearing or movement of the affected extremity. As patient's pain worsened over the next several days this was associated with increasing redness and swelling of the left foot. Patient is complaining of associated poor appetite and difficulty with ambulation.  Assessment & Plan:   Principal Problem:   Acute osteomyelitis of left foot (HCC) Active Problems:   Uncontrolled type 2 diabetes mellitus with hyperglycemia, with long-term current use of insulin (HCC)   Acute gout of right knee   Lactic acidosis   Sepsis due to Enterobacter ([place])   Wound infection  Acute osteomyelitisand cellulitisof left foot (HCC) -Met SIRS criteria on admission and this is improving with broad spectrum antibiotics -Orthopedics on board and Dr Sharol Given would like another 2 days IV antibiotics and plan to discharge on cipro/doxycycline, no I and D planned at this time -Continue Vancomycin/Cefepime for now -MRI foot with changes could be post-op or infectious -blood cultures pending  Lactic acidosis: -got IVF and  trending down -related to foot infection -see treatment for infection  Sepsis due to Enterobacter ([place]) -see above  Uncontrolled type 2 diabetes mellitus with hyperglycemia, with long-term current use of insulin ([place]) -Patient states that he frequently has low blood sugar as of late and therefore we will temporarily place patient on a reduced regimen of Levemir 35 units twice daily -CBG monitoring before every meal and nightly with sliding scale insulin -Recent hemoglobin A1c of 7.2 reveals reasonable outpatient management -Diabetic diet  Acute gout of right knee -colchicine ordered for daily -added indomethacin BID for pain control -given ongoing infection on foot will not do prednisone at this time  DVT prophylaxis: Lovenox Code Status: Full Family Communication: patient only Disposition Plan:  . Patient came from:home            . Anticipated d/c place:home . Barriers to d/c OR conditions which need to be met to effect a safe d/c:needs continued IV antibiotics for 2 more days and then can be safely discharged with oral antibiotics assuming foot continues with improvement   Consultants:   Orthopedics  Procedures:   none  Antimicrobials: (specify start and planned stop date. Auto populated tables are space occupying and do not give end dates)  Vancomycin start date 04/11/19 stop date 04/15/19  Cefepime start date 04/11/19 stop date 04/15/19  Cipro/doxy start date 04/16/19  Subjective: In pain from right knee today and is concerned about gout. Got indomethacin last night which was able to help some. Is not getting much pain relief from dilaudid (only 30 minutes relief) or the percocet (about 1-2 hours relief) with his knee and this is limiting his walking. He is relieved that no surgery is needed for his left foot. Denies fevers  or chills. Denies chest pains or SOB or abdominal pain.   Objective: Vitals:   04/12/19 1930 04/12/19 2356 04/13/19 0239 04/13/19 0732    BP: 122/70 126/71 115/74 122/78  Pulse: 87 88 85 93  Resp: 16 17 16 17   Temp: 99.3 F (37.4 C) 98.4 F (36.9 C) 98.1 F (36.7 C) 98.3 F (36.8 C)  TempSrc: Oral Oral Oral Oral  SpO2: 92% 90% 90% 96%  Weight:      Height:        Intake/Output Summary (Last 24 hours) at 04/13/2019 1300 Last data filed at 04/13/2019 0700 Gross per 24 hour  Intake 1040 ml  Output 3550 ml  Net -2510 ml   Filed Weights   04/11/19 0353  Weight: 117.9 kg   Examination:  General exam: Appears calm and comfortable, obese Respiratory system: Clear to auscultation. Respiratory effort normal. Cardiovascular system: S1 & S2 heard, RRR. No JVD, murmurs, rubs, gallops or clicks. No pedal edema. Gastrointestinal system: Abdomen is nondistended, soft and nontender. No organomegaly or masses felt. Normal bowel sounds heard. Central nervous system: Alert and oriented. No focal neurological deficits. Extremities: Symmetric movement, right knee with pain and fluid to palpation no redness, right ankle and foot without pain to palpation or swelling or redness, left foot stable with wound dehiscence without purulent drainage Skin: No rashes, lesions or ulcers Psychiatry: Judgement and insight appear normal. Mood & affect appropriate.   Data Reviewed: I have personally reviewed following labs and imaging studies  CBC: Recent Labs  Lab 04/11/19 0032 04/12/19 0447 04/13/19 0557  WBC 13.4* 6.2 5.3  NEUTROABS 11.9* 3.9  --   HGB 9.8* 8.8* 8.6*  HCT 31.0* 27.5* 26.7*  MCV 97.8 95.8 95.0  PLT 173 123* 338*   Basic Metabolic Panel: Recent Labs  Lab 04/11/19 0032 04/12/19 0447 04/13/19 0557  NA 138 141 138  K 4.1 4.3 4.2  CL 108 107 106  CO2 19* 25 22  GLUCOSE 215* 141* 144*  BUN 13 11 14   CREATININE 1.04 1.01 0.95  CALCIUM 9.2 9.2 9.0   GFR: Estimated Creatinine Clearance: 111.5 mL/min (by C-G formula based on SCr of 0.95 mg/dL). Liver Function Tests: Recent Labs  Lab 04/11/19 0032  04/12/19 0447  AST 13* 11*  ALT 10 10  ALKPHOS 62 53  BILITOT 0.7 0.6  PROT 7.4 6.3*  ALBUMIN 3.5 3.0*   No results for input(s): LIPASE, AMYLASE in the last 168 hours. No results for input(s): AMMONIA in the last 168 hours. Coagulation Profile: Recent Labs  Lab 04/11/19 0236 04/12/19 0447  INR 1.1 0.9   Cardiac Enzymes: No results for input(s): CKTOTAL, CKMB, CKMBINDEX, TROPONINI in the last 168 hours. BNP (last 3 results) No results for input(s): PROBNP in the last 8760 hours. HbA1C: No results for input(s): HGBA1C in the last 72 hours. CBG: Recent Labs  Lab 04/12/19 1116 04/12/19 1717 04/12/19 2046 04/13/19 0721 04/13/19 1206  GLUCAP 146* 117* 127* 168* 168*   Lipid Profile: No results for input(s): CHOL, HDL, LDLCALC, TRIG, CHOLHDL, LDLDIRECT in the last 72 hours. Thyroid Function Tests: No results for input(s): TSH, T4TOTAL, FREET4, T3FREE, THYROIDAB in the last 72 hours. Anemia Panel: No results for input(s): VITAMINB12, FOLATE, FERRITIN, TIBC, IRON, RETICCTPCT in the last 72 hours. Sepsis Labs: Recent Labs  Lab 04/11/19 0031 04/11/19 0216 04/11/19 0512 04/11/19 1617 04/12/19 0447  PROCALCITON  --   --   --   --  <0.10  LATICACIDVEN 2.3* 3.3* 1.7 1.1  --  Recent Results (from the past 240 hour(s))  Blood Culture (routine x 2)     Status: None (Preliminary result)   Collection Time: 04/11/19  2:16 AM   Specimen: BLOOD  Result Value Ref Range Status   Specimen Description BLOOD LEFT ARM  Final   Special Requests   Final    BOTTLES DRAWN AEROBIC AND ANAEROBIC Blood Culture results may not be optimal due to an excessive volume of blood received in culture bottles   Culture   Final    NO GROWTH 2 DAYS Performed at [facility], [place] [address], [place], [place] [address]    Report Status PENDING  Incomplete  Blood Culture (routine x 2)     Status: None (Preliminary result)   Collection Time: 04/11/19  2:59 AM   Specimen: BLOOD  Result Value  Ref Range Status   Specimen Description BLOOD RIGHT HAND  Final   Special Requests   Final    BOTTLES DRAWN AEROBIC AND ANAEROBIC Blood Culture adequate volume   Culture   Final    NO GROWTH 2 DAYS Performed at [facility], [place] [address], [place], [place] [address]    Report Status PENDING  Incomplete  SARS CORONAVIRUS 2 (TAT 6-24 HRS) Nasopharyngeal Nasopharyngeal Swab     Status: None   Collection Time: 04/11/19  3:56 AM   Specimen: Nasopharyngeal Swab  Result Value Ref Range Status   SARS Coronavirus 2 NEGATIVE NEGATIVE Final    Comment: (NOTE) SARS-CoV-2 target nucleic acids are NOT DETECTED. The SARS-CoV-2 RNA is generally detectable in upper and lower respiratory specimens during the acute phase of infection. Negative results do not preclude SARS-CoV-2 infection, do not rule out co-infections with other pathogens, and should not be used as the sole basis for treatment or other patient management decisions. Negative results must be combined with clinical observations, patient history, and epidemiological information. The expected result is Negative. Fact Sheet for Patients: SugarRoll.be Fact Sheet for Healthcare Providers: https://www.woods-mathews.com/ This test is not yet approved or cleared by the [place] FDA and  has been authorized for detection and/or diagnosis of SARS-CoV-2 by FDA under an Emergency Use Authorization (EUA). This EUA will remain  in effect (meaning this test can be used) for the duration of the COVID-19 declaration under Section 56 4(b)(1) of the Act, 21 U.S.C. section 360bbb-3(b)(1), unless the authorization is terminated or revoked sooner. Performed at [facility], [place] [address] [place], [place] [address]   Urine culture     Status: None   Collection Time: 04/11/19  6:52 AM   Specimen: In/Out Cath Urine  Result Value Ref Range Status   Specimen Description IN/OUT CATH URINE  Final    Special Requests NONE  Final   Culture   Final    NO GROWTH Performed at [facility], [place] [address], [place], [place] [address]    Report Status 04/12/2019 FINAL  Final    Radiology Studies: No results found.  Scheduled Meds: . allopurinol  300 mg Oral Daily  . amitriptyline  25 mg Oral QHS  . amLODipine  5 mg Oral Daily  . colchicine  0.6 mg Oral Daily  . enoxaparin (LOVENOX) injection  40 mg Subcutaneous Q24H  . gabapentin  300 mg Oral TID  . indomethacin  50 mg Oral BID WC  . insulin aspart  0-15 Units Subcutaneous TID AC & HS  . insulin detemir  35 Units Subcutaneous BID  . lisinopril  40 mg Oral Daily  . metoprolol succinate  100 mg Oral Daily   Continuous Infusions: . ceFEPime (MAXIPIME) IV 2 g (04/13/19 0526)  . vancomycin 1,000 mg (04/13/19 1219)    LOS: 2 days   Time spent: [place], MD [facility]   To contact the attending provider between 7A-7P or the covering provider during after hours 7P-7A, please log into the web site [email or web address] and access using universal [place] password for that web site. If you do not have the password, please call the hospital operator.  04/13/2019, 1:00 PM

## 2019-04-13 NOTE — Plan of Care (Signed)
  Problem: Education: Goal: Knowledge of General Education information will improve Description: Including pain rating scale, medication(s)/side effects and non-pharmacologic comfort measures Outcome: Progressing   Problem: Clinical Measurements: Goal: Respiratory complications will improve Outcome: Progressing Note: On room air   Problem: Nutrition: Goal: Adequate nutrition will be maintained Outcome: Progressing   Problem: Coping: Goal: Level of anxiety will decrease Outcome: Progressing   Problem: Elimination: Goal: Will not experience complications related to urinary retention Outcome: Progressing   Problem: Pain Managment: Goal: General experience of comfort will improve Outcome: Progressing Note: Treated for pain on and off all shift going between oxycodone & diluadid, pain continues at a 6 in his left foot & right knee ( gout pain)   Problem: Safety: Goal: Ability to remain free from injury will improve Outcome: Progressing   Problem: Skin Integrity: Goal: Risk for impaired skin integrity will decrease Outcome: Progressing

## 2019-04-13 NOTE — Plan of Care (Signed)
  Problem: Clinical Measurements: Goal: Will remain free from infection Outcome: Progressing Goal: Cardiovascular complication will be avoided Outcome: Progressing   Problem: Activity: Goal: Risk for activity intolerance will decrease Outcome: Progressing   Problem: Nutrition: Goal: Adequate nutrition will be maintained Outcome: Progressing   

## 2019-04-13 NOTE — Consult Note (Addendum)
ORTHOPAEDIC CONSULTATION  REQUESTING PHYSICIAN: Hoyt Koch, MD  Chief Complaint: Cellulitis left foot  HPI: [name] is a 64 y.o. male who presents with cellulitis left foot 2 weeks status post left transmetatarsal amputation.  Patient also complains of acute gouty flareup in his right knee.  Past Medical History:  Diagnosis Date  . Arthritis   . Back pain   . Diabetes mellitus without complication ([place])   . Diabetic foot ulcer ([place]) 03/2019  . Hypertension    Past Surgical History:  Procedure Laterality Date  . BACK SURGERY    . CARPAL TUNNEL RELEASE    . CHOLECYSTECTOMY    . FOOT SURGERY    . JOINT REPLACEMENT    . SHOULDER ARTHROSCOPY    . TOTAL KNEE ARTHROPLASTY    . TRANSMETATARSAL AMPUTATION Left 03/27/2019   Procedure: TRANSMETATARSAL AMPUTATION left foot;  Surgeon: [name], MD;  Location: [place];  Service: Orthopedics;  Laterality: Left;   Social History   Socioeconomic History  . Marital status: Single    Spouse name: Not on file  . Number of children: Not on file  . Years of education: Not on file  . Highest education level: Not on file  Occupational History  . Not on file  Tobacco Use  . Smoking status: Never Smoker  . Smokeless tobacco: Never Used  Substance and Sexual Activity  . Alcohol use: No  . Drug use: No  . Sexual activity: Not on file  Other Topics Concern  . Not on file  Social History Narrative  . Not on file   Social Determinants of Health   Financial Resource Strain:   . Difficulty of Paying Living Expenses:   Food Insecurity:   . Worried About Charity fundraiser in the Last Year:   . Arboriculturist in the Last Year:   Transportation Needs:   . Film/video editor (Medical):   Marland Kitchen Lack of Transportation (Non-Medical):   Physical Activity:   . Days of Exercise per Week:   . Minutes of Exercise per Session:   Stress:   . Feeling of Stress :   Social Connections:   . Frequency of Communication with  Friends and Family:   . Frequency of Social Gatherings with Friends and Family:   . Attends Religious Services:   . Active Member of Clubs or Organizations:   . Attends Archivist Meetings:   Marland Kitchen Marital Status:    Family History  Problem Relation Age of Onset  . Heart failure Mother   . Cancer Father    - negative except otherwise stated in the family history section Allergies  Allergen Reactions  . Other Hives    Anti-snake venim for copperhead venom.  . Pravastatin Other (See Comments)    Leg cramps, elevated CK  . Prednisone Other (See Comments)    Patient states "it runs my blood sugar up so I don't take it."  . Toradol [Ketorolac Tromethamine] Hives and Nausea And Vomiting    Pill form only   Prior to Admission medications   Medication Sig Start Date End Date Taking? Authorizing Provider  allopurinol (ZYLOPRIM) 300 MG tablet Take 300 mg by mouth daily. 02/19/19  Yes [provider]  amitriptyline (ELAVIL) 25 MG tablet Take 25 mg by mouth at bedtime. 03/11/19  Yes [provider]  amLODipine (NORVASC) 10 MG tablet Take 5 mg by mouth daily.  02/08/16  Yes [provider]  aspirin EC  81 MG tablet Take 81 mg by mouth daily.   Yes [provider]  furosemide (LASIX) 20 MG tablet Take 20 mg by mouth daily. 01/06/19  Yes [provider]  gabapentin (NEURONTIN) 300 MG capsule Take 300 mg by mouth 3 (three) times daily. 03/16/19  Yes [provider]  LEVEMIR FLEXTOUCH 100 UNIT/ML Pen Inject 70 Units into the skin 2 (two) times daily.  10/08/13  Yes [provider]  lisinopril (PRINIVIL,ZESTRIL) 40 MG tablet Take 40 mg by mouth daily. 03/05/16  Yes [provider]  metoprolol succinate (TOPROL-XL) 100 MG 24 hr tablet Take 100 mg by mouth daily. 02/04/19  Yes [provider]  Oxycodone HCl 10 MG TABS Take 10 mg by mouth every 6 (six) hours. 02/19/19  Yes [provider]  doxycycline (VIBRA-TABS) 100  MG tablet Take 1 tablet (100 mg total) by mouth 2 (two) times daily. 04/09/19   Persons, Bevely Palmer, PA  predniSONE (DELTASONE) 10 MG tablet Take 3 tablets (30 mg total) by mouth daily with breakfast. 04/01/19   [name], MD   MR FOOT LEFT WO CONTRAST  Result Date: 04/11/2019 CLINICAL DATA:  Left foot osteomyelitis. Prior amputation. EXAM: MRI OF THE LEFT FOOT WITHOUT CONTRAST TECHNIQUE: Multiplanar, multisequence MR imaging of the left foot was performed. No intravenous contrast was administered. COMPARISON:  X-ray left foot 03/25/2019, 03/29/2017 FINDINGS: Bones/Joint/Cartilage Prior transmetatarsal amputation of left foot through the proximal-mid diaphyses. Soft tissue wound at the amputation stump distally. Complex fluid collection with heterogeneous T1 and T2 hyperintensity in the soft tissues measuring approximately 5.9 x 1.8 x 2 cm draining through the wound. Marrow edema within the stumps of the first through fifth metatarsals most severe in the second and fifth metatarsals which may reflect reactive edema given recent surgery versus osteomyelitis. No acute fracture or dislocation. Mild osteoarthritis of the talonavicular joint, calcaneocuboid joint, third TMT joint and fourth TMT joint. Subcortical marrow edema which is likely reactive along the medial aspect of the talus adjacent to the course of the posterior tibial tendon. Ligaments Lisfranc ligament is intact. Muscles and Tendons Severe tendinosis and a partial-thickness tear of the posterior tibial tendon. Intact flexor hallucis longus and flexor digitorum tendons. Mild tendinosis of the Achilles tendon with a small amount of fluid in the retrocalcaneal bursa. Mild tendinosis of the peroneus longus and peroneus brevis tendons. Extensor compartment tendons are intact. Generalized muscle atrophy. Soft tissue No fluid collection or hematoma. No soft tissue mass. Soft tissue wound at the amputation stump distally. Complex fluid collection in  the soft tissues measuring approximately 5.9 x 1.8 x 2 cm draining through the wound. IMPRESSION: 1. Prior transmetatarsal amputation of the left foot through the proximal-mid diaphyses. Complex fluid collection in the soft tissues measuring approximately 5.9 x 1.8 x 2 cm draining through the wound concerning for a hematoma, but a secondarily infected hematoma or concomitant abscess cannot be excluded. Marrow edema within the stumps of the first through fifth metatarsals most severe in the second and fifth metatarsals which may reflect reactive edema given recent surgery versus osteomyelitis. 2. Severe tendinosis and a partial-thickness tear of the posterior tibial tendon with adjacent marrow edema in the medial talus. 3. Mild tendinosis of the Achilles tendon with a small amount of fluid in the retrocalcaneal bursa. Electronically Signed   By: Kathreen Devoid   On: 04/11/2019 09:02   - pertinent xrays, CT, MRI studies were reviewed and independently interpreted  Positive ROS: All other systems have  been reviewed and were otherwise negative with the exception of those mentioned in the HPI and as above.  Physical Exam: General: Alert, no acute distress Psychiatric: Patient is competent for consent with normal mood and affect Lymphatic: No axillary or cervical lymphadenopathy Cardiovascular: No pedal edema Respiratory: No cyanosis, no use of accessory musculature GI: No organomegaly, abdomen is soft and non-tender    Images:  @ENCIMAGES @  Labs:  Lab Results  Component Value Date   HGBA1C 7.2 (H) 03/25/2019   HGBA1C 7.1 (H) 03/28/2017   ESRSEDRATE 81 (H) 03/25/2019   CRP 1.4 (H) 04/11/2019   CRP 1.9 (H) 03/25/2019   REPTSTATUS 04/12/2019 FINAL 04/11/2019   GRAMSTAIN  03/27/2019    RARE WBC PRESENT,BOTH PMN AND MONONUCLEAR NO ORGANISMS SEEN    CULT  04/11/2019    NO GROWTH Performed at [facility], [place] [address] [place],  [address]    LABORGA ENTEROBACTER AEROGENES  03/27/2019    Lab Results  Component Value Date   ALBUMIN 3.0 (L) 04/12/2019   ALBUMIN 3.5 04/11/2019   ALBUMIN 3.7 03/25/2019   PREALBUMIN 17.6 (L) 03/25/2019    Neurologic: Patient does not have protective sensation bilateral lower extremities.   MUSCULOSKELETAL:   Skin: Examination the cellulitis has resolved in the left foot his foot is not tender to palpation there is some dried blood around the surgical incision but no drainage patient has good active range of motion of his ankle that is asymptomatic.  Patient has swelling without redness of his right knee.  Patient is currently on allopurinol 300 mg a day and colchicine 0.6 mg a day.  Review of the MRI scan does show edema in the metatarsals consistent with recent surgery.  There is a fluid collection which clinically is a hematoma..  Patient's initial soft tissue cultures were positive for Enterobacter which was sensitive to Cipro.   Assessment: Assessment: Resolving cellulitis left foot with acute gout.  Plan: Plan: Would continue IV antibiotics for several days and then discharged on oral antibiotics.  Patient could discharge on Cipro 500 mg twice daily and discontinue his doxycycline.  Patient's initial soft tissue cultures were positive for Enterobacter and was sensitive to Cipro.  I will draw a uric acid level patient most likely will need to be discharged on both colchicine and allopurinol.  Thank you for the consult and the opportunity to see Mr. [name], [place] [phone] 7:55 AM

## 2019-04-14 ENCOUNTER — Encounter (HOSPITAL_BASED_OUTPATIENT_CLINIC_OR_DEPARTMENT_OTHER): Payer: Medicaid Other | Admitting: Physician Assistant

## 2019-04-14 LAB — GLUCOSE, CAPILLARY
Glucose-Capillary: 140 mg/dL — ABNORMAL HIGH (ref 70–99)
Glucose-Capillary: 200 mg/dL — ABNORMAL HIGH (ref 70–99)
Glucose-Capillary: 185 mg/dL — ABNORMAL HIGH (ref 70–99)
Glucose-Capillary: 126 mg/dL — ABNORMAL HIGH (ref 70–99)
Glucose-Capillary: 185 mg/dL — ABNORMAL HIGH (ref 70–99)

## 2019-04-14 MED ORDER — KETOROLAC TROMETHAMINE 60 MG/2ML IM SOLN
60.0000 mg | Freq: Two times a day (BID) | INTRAMUSCULAR | Status: DC | PRN
  Administered 2019-04-14 – 2019-04-15 (×2): 60 mg via INTRAMUSCULAR
  Filled 2019-04-14 (×4): qty 2

## 2019-04-14 MED ORDER — KETOROLAC TROMETHAMINE 60 MG/2ML IM SOLN: 60.0000 mg | Freq: Three times a day (TID) | INTRAMUSCULAR | Status: DC | PRN

## 2019-04-14 NOTE — Progress Notes (Signed)
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 Wound culture during the hospitalization grew out Enterobacter aerogenes  Patient explains that approximately 5 days ago he began to develop recurrent pain of the left foot. Pain is throbbing in quality, moderate to severe in intensity nonradiating and worse with weightbearing or movement of the affected extremity. As patient's pain worsened over the next several days this was associated with increasing redness and swelling of the left foot. Patient is complaining of associated poor appetite and difficulty with ambulation.  3/31: Still having pain in left foot and right knee. Added toradol for the right knee suspected gout. Needs IV antibiotics through dose on 04/15/19 and then per orthopedics is stable for discharge on ciprofloxacin and doxycycline oral antibiotics.  Assessment & Plan:   Principal Problem:   Acute osteomyelitis of left foot (HCC) Active Problems:   Uncontrolled type 2 diabetes mellitus with hyperglycemia, with long-term current use of insulin (HCC)   Acute gout of right knee   Lactic acidosis   Sepsis due to Enterobacter ([place])   Wound infection  Acute osteomyelitisand cellulitisof left foot (HCC) -Met SIRS criteria on admission and this is improving with broad spectrum antibiotics -Orthopedics on board and Dr Sharol Given would like IV antibiotics  through 04/15/19 and plan to discharge on cipro/doxycycline, no I and D planned at this time -Continue Vancomycin/Cefepime for now -MRI foot with changes could be post-op or infectious -blood cultures pending, NGTD  Lactic acidosis: -got IVF and trending down -related to foot infection -see treatment for infection  Sepsis due to Enterobacter ([place]) -see above  Uncontrolled type 2 diabetes mellitus with hyperglycemia, with long-term current use of insulin ([place]) -Patient states that he frequently has low blood sugar as of late and therefore we will temporarily place patient on a reduced regimen of Levemir 35 units twice daily -CBG monitoringbefore every meal and nightly with sliding scale insulin -Recent hemoglobin A1c of 7.2 reveals reasonable outpatient management -Diabetic diet  Acute gout of right knee -colchicine ordered for daily -indomethacin BID for pain control -added toradol 60 mg IV BID prn for pain given persistent pain -given ongoing infection on foot will not do prednisone at this time  DVT prophylaxis: Lovenox Code Status: full Family Communication: patient only Disposition Plan:  . Patient came from:home            . Anticipated d/c place:home . Barriers to d/c OR conditions which need to be met to effect a safe d/c:needs additional IV antibiotics until 04/15/19 and then will be appropriate for discharge as long as foot is still improving and there is not worsening.   Consultants:   Orthopedics  Procedures:   none  Antimicrobials:   Vancomycin start date 04/11/19 stop date 04/15/19  Cefepime start date 04/11/19 stop date 04/15/19  Cipro/doxy start date 04/16/19  Subjective: Still having pain in right knee and left foot. Did sleep okay. Pain medication does not seem to help it much. Denies other complaints such as  chest pains, SOB, abdominal pain. No diarrhea or constipation. No N/V and eating fine.   Objective: Vitals:   04/13/19 1913 04/13/19  2319 04/14/19 0404 04/14/19 0816  BP: 114/71 125/76 119/82 120/75  Pulse: 75 80 93 (!) 107  Resp: 18 18 17 18   Temp: 98.3 F (36.8 C) 98.4 F (36.9 C) 98.6 F (37 C) 98.6 F (37 C)  TempSrc: Oral Oral Oral Oral  SpO2: 94% 97% 91% 94%  Weight:      Height:        Intake/Output Summary (Last 24 hours) at 04/14/2019 1128 Last data filed at 04/14/2019 0849 Gross per 24 hour  Intake 1756.74 ml  Output 2550 ml  Net -793.26 ml   Filed Weights   04/11/19 0353  Weight: 117.9 kg    Examination:  General exam: Appears calm and comfortable  Respiratory system: Clear to auscultation. Respiratory effort normal. Cardiovascular system: S1 & S2 heard, RRR. No JVD, murmurs, rubs, gallops or clicks. No pedal edema. Gastrointestinal system: Abdomen is nondistended, soft and nontender. No organomegaly or masses felt. Normal bowel sounds heard. Central nervous system: Alert and oriented. No focal neurological deficits. Extremities: Pain left foot and right knee, right knee appears stable to mildly improved with swelling from yesterday, left foot without oozing blood today. Skin: No rashes, lesions or ulcers Psychiatry: Judgement and insight appear normal. Mood & affect appropriate.     Data Reviewed: I have personally reviewed following labs and imaging studies  CBC: Recent Labs  Lab 04/11/19 0032 04/12/19 0447 04/13/19 0557  WBC 13.4* 6.2 5.3  NEUTROABS 11.9* 3.9  --   HGB 9.8* 8.8* 8.6*  HCT 31.0* 27.5* 26.7*  MCV 97.8 95.8 95.0  PLT 173 123* 532*   Basic Metabolic Panel: Recent Labs  Lab 04/11/19 0032 04/12/19 0447 04/13/19 0557  NA 138 141 138  K 4.1 4.3 4.2  CL 108 107 106  CO2 19* 25 22  GLUCOSE 215* 141* 144*  BUN 13 11 14   CREATININE 1.04 1.01 0.95  CALCIUM 9.2 9.2 9.0   GFR: Estimated Creatinine Clearance: 111.5 mL/min (by C-G formula based on SCr of 0.95 mg/dL). Liver Function Tests: Recent Labs  Lab 04/11/19 0032 04/12/19 0447  AST 13* 11*  ALT 10 10    ALKPHOS 62 53  BILITOT 0.7 0.6  PROT 7.4 6.3*  ALBUMIN 3.5 3.0*   No results for input(s): LIPASE, AMYLASE in the last 168 hours. No results for input(s): AMMONIA in the last 168 hours. Coagulation Profile: Recent Labs  Lab 04/11/19 0236 04/12/19 0447  INR 1.1 0.9   Cardiac Enzymes: No results for input(s): CKTOTAL, CKMB, CKMBINDEX, TROPONINI in the last 168 hours. BNP (last 3 results) No results for input(s): PROBNP in the last 8760 hours. HbA1C: No results for input(s): HGBA1C in the last 72 hours. CBG: Recent Labs  Lab 04/13/19 1206 04/13/19 1629 04/13/19 2011 04/14/19 0640 04/14/19 0815  GLUCAP 168* 159* 166* 140* 200*   Lipid Profile: No results for input(s): CHOL, HDL, LDLCALC, TRIG, CHOLHDL, LDLDIRECT in the last 72 hours. Thyroid Function Tests: No results for input(s): TSH, T4TOTAL, FREET4, T3FREE, THYROIDAB in the last 72 hours. Anemia Panel: No results for input(s): VITAMINB12, FOLATE, FERRITIN, TIBC, IRON, RETICCTPCT in the last 72 hours. Sepsis Labs: Recent Labs  Lab 04/11/19 0031 04/11/19 0216 04/11/19 0512 04/11/19 1617 04/12/19 0447  PROCALCITON  --   --   --   --  <0.10  LATICACIDVEN 2.3* 3.3* 1.7 1.1  --  Recent Results (from the past 240 hour(s))  Blood Culture (routine x 2)     Status: None (Preliminary result)   Collection Time: 04/11/19  2:16 AM   Specimen: BLOOD  Result Value Ref Range Status   Specimen Description BLOOD LEFT ARM  Final   Special Requests   Final    BOTTLES DRAWN AEROBIC AND ANAEROBIC Blood Culture results may not be optimal due to an excessive volume of blood received in culture bottles   Culture   Final    NO GROWTH 2 DAYS Performed at [facility], [place] [address], [place], [place] [address]    Report Status PENDING  Incomplete  Blood Culture (routine x 2)     Status: None (Preliminary result)   Collection Time: 04/11/19  2:59 AM   Specimen: BLOOD  Result Value Ref Range Status   Specimen Description  BLOOD RIGHT HAND  Final   Special Requests   Final    BOTTLES DRAWN AEROBIC AND ANAEROBIC Blood Culture adequate volume   Culture   Final    NO GROWTH 2 DAYS Performed at [facility], [place] [address] [place], [place] [address]    Report Status PENDING  Incomplete  SARS CORONAVIRUS 2 (TAT 6-24 HRS) Nasopharyngeal Nasopharyngeal Swab     Status: None   Collection Time: 04/11/19  3:56 AM   Specimen: Nasopharyngeal Swab  Result Value Ref Range Status   SARS Coronavirus 2 NEGATIVE NEGATIVE Final    Comment: (NOTE) SARS-CoV-2 target nucleic acids are NOT DETECTED. The SARS-CoV-2 RNA is generally detectable in upper and lower respiratory specimens during the acute phase of infection. Negative results do not preclude SARS-CoV-2 infection, do not rule out co-infections with other pathogens, and should not be used as the sole basis for treatment or other patient management decisions. Negative results must be combined with clinical observations, patient history, and epidemiological information. The expected result is Negative. Fact Sheet for Patients: SugarRoll.be Fact Sheet for Healthcare Providers: https://www.woods-mathews.com/ This test is not yet approved or cleared by the [place] FDA and  has been authorized for detection and/or diagnosis of SARS-CoV-2 by FDA under an Emergency Use Authorization (EUA). This EUA will remain  in effect (meaning this test can be used) for the duration of the COVID-19 declaration under Section 56 4(b)(1) of the Act, 21 U.S.C. section 360bbb-3(b)(1), unless the authorization is terminated or revoked sooner. Performed at [facility], [place] [address], [place], [place] [address]   Urine culture     Status: None   Collection Time: 04/11/19  6:52 AM   Specimen: In/Out Cath Urine  Result Value Ref Range Status   Specimen Description IN/OUT CATH URINE  Final   Special Requests NONE  Final   Culture    Final    NO GROWTH Performed at [facility], [place] [address] [place], [place] [address]    Report Status 04/12/2019 FINAL  Final    Radiology Studies: No results found.  Scheduled Meds: . allopurinol  300 mg Oral Daily  . amitriptyline  25 mg Oral QHS  . amLODipine  5 mg Oral Daily  . colchicine  0.6 mg Oral Daily  . enoxaparin (LOVENOX) injection  40 mg Subcutaneous Q24H  . gabapentin  300 mg Oral TID  . indomethacin  50 mg Oral BID WC  . insulin aspart  0-15 Units Subcutaneous TID AC & HS  . insulin detemir  35 Units Subcutaneous BID  . lisinopril  40 mg Oral Daily  . metoprolol succinate  100 mg Oral Daily   Continuous Infusions: . sodium chloride 10 mL/hr at 04/13/19 1617  . ceFEPime (MAXIPIME) IV 2 g (04/14/19 0526)     LOS: 3 days   Time spent: 25  Hoyt Koch, MD [facility]  To contact the attending provider between 7A-7P or the covering provider during after hours 7P-7A, please log into the web site [email or web address] and access using universal [place] password for that web site. If you do not have the password, please call the hospital operator.  04/14/2019, 11:28 AM

## 2019-04-14 NOTE — Plan of Care (Signed)
  Problem: Education: Goal: Knowledge of General Education information will improve Description: Including pain rating scale, medication(s)/side effects and non-pharmacologic comfort measures Outcome: Progressing   Problem: Clinical Measurements: Goal: Respiratory complications will improve Outcome: Progressing Note: Remains on room air   Problem: Activity: Goal: Risk for activity intolerance will decrease Outcome: Progressing Note: Walked to bathroom with with walker standby assist tolerated well   Problem: Nutrition: Goal: Adequate nutrition will be maintained Outcome: Progressing   Problem: Coping: Goal: Level of anxiety will decrease Outcome: Progressing   Problem: Elimination: Goal: Will not experience complications related to bowel motility Outcome: Progressing Note: BM this shift Goal: Will not experience complications related to urinary retention Outcome: Progressing   Problem: Pain Managment: Goal: General experience of comfort will improve Outcome: Progressing Note: Treated for left foot pain and right knee gout pain throughout the day with dilaudid, oxycodone & Toradol toradol finally giving him some relief   Problem: Safety: Goal: Ability to remain free from injury will improve Outcome: Progressing

## 2019-04-15 LAB — BASIC METABOLIC PANEL
Anion gap: 11 (ref 5–15)
BUN: 24 mg/dL — ABNORMAL HIGH (ref 8–23)
CO2: 24 mmol/L (ref 22–32)
Calcium: 8.9 mg/dL (ref 8.9–10.3)
Chloride: 102 mmol/L (ref 98–111)
Creatinine, Ser: 1.08 mg/dL (ref 0.61–1.24)
GFR calc Af Amer: >60 mL/min (ref >60)
GFR calc non Af Amer: >60 mL/min (ref >60)
Glucose, Bld: 155 mg/dL — ABNORMAL HIGH (ref 70–99)
Potassium: 4.5 mmol/L (ref 3.5–5.1)
Sodium: 137 mmol/L (ref 135–145)

## 2019-04-15 LAB — CBC
HCT: 27.5 % — ABNORMAL LOW (ref 39.0–52.0)
Hemoglobin: 8.8 g/dL — ABNORMAL LOW (ref 13.0–17.0)
MCH: 30.7 pg (ref 26.0–34.0)
MCHC: 32 g/dL (ref 30.0–36.0)
MCV: 95.8 fL (ref 80.0–100.0)
Platelets: 132 10*3/uL — ABNORMAL LOW (ref 150–400)
RBC: 2.87 MIL/uL — ABNORMAL LOW (ref 4.22–5.81)
RDW: 17 % — ABNORMAL HIGH (ref 11.5–15.5)
WBC: 5.2 10*3/uL (ref 4.0–10.5)
nRBC: 0 % (ref 0.0–0.2)

## 2019-04-15 LAB — GLUCOSE, CAPILLARY
Glucose-Capillary: 146 mg/dL — ABNORMAL HIGH (ref 70–99)
Glucose-Capillary: 160 mg/dL — ABNORMAL HIGH (ref 70–99)

## 2019-04-15 MED ORDER — CIPROFLOXACIN HCL 500 MG PO TABS: 500.0000 mg | ORAL_TABLET | Freq: Two times a day (BID) | ORAL | 0 refills | Status: DC

## 2019-04-15 NOTE — Progress Notes (Signed)
Pt given discharge instructions and gone over with him. All questions answered. Pt waiting on ride for discharge. Will let RN know when they arrive.

## 2019-04-15 NOTE — Plan of Care (Signed)
  Problem: Health Behavior/Discharge Planning: Goal: Ability to manage health-related needs will improve Outcome: Adequate for Discharge   Problem: Clinical Measurements: Goal: Will remain free from infection Outcome: Adequate for Discharge   Problem: Pain Managment: Goal: General experience of comfort will improve Outcome: Adequate for Discharge

## 2019-04-15 NOTE — TOC Transition Note (Signed)
Transition of Care [facility]) - CM/SW Discharge Note   Patient Details  Name: Oaklyn Mans MRN: [id number] Date of Birth: [birth year]  [facility]) CM/SW Contact:  Sharin Mons, RN Phone Number: [phone] 04/15/2019, 9:40 AM   Clinical Narrative:   Readmitted with acute osteomyelitis and cellulitis of left foot. Previous admit, s/pTRANSMETATARSAL AMPUTATION of the  left foot. From home alone. Supportive son, Alroy Dust.  Darr[place])      [phone]     Pt will transition to home today with the resumption of home health services ( RN,PT) with [facility]. [facility] liaison Butch Penny made aware of d/c plan. Pt without DME needs or Rx med concerns.  Son to provide transportation to home .  Final next level of care: [place]) Barriers to Discharge: No Barriers Identified   Patient Goals and CMS Choice        Discharge Placement     Discharge Plan and Services         HH Arranged: PT, RN [facility] Agency: [place] ([facility]) Date HH Agency Contacted: 04/15/19 Time [place]: [phone] Representative spoke with at [place]: [place] ([place]) Interventions     Readmission Risk Interventions No flowsheet data found.

## 2019-04-15 NOTE — Discharge Summary (Signed)
Physician Discharge Summary  [name] YSA:[id number] DOB: 04-09-1955 DOA: 04/11/2019  PCP: [facility] [place] date: 04/11/2019 Discharge date: 04/15/2019  Admitted From: Home  Disposition:  Home   Recommendations for Outpatient Follow-up:  1. Follow up with [name] tomorrow  2. Please obtain BMP/CBC in one week   Home Health: None  Equipment/Devices: None  Discharge Condition: Fair  CODE STATUS: FULL Diet recommendation: Diabetic  Brief/Interim Summary: [name] is a 64 y.o. 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  Admitted and started on empiric antibiotics.  Orthopedics consulted.  Wound culture during this hospitalization growing Enterobacter, sensitive to ciprofloxacin.        PRINCIPAL HOSPITAL DIAGNOSIS: Mild sepsis due to wound infection    Discharge Diagnoses:   Sepsis due to wound infection Patient was admitted with a superficial wound infection and mild cellulitis.  Orthopedics were consulted.  MRI showed expected postop changes, no osteomyelitis.  He was treated with broad-spectrum IV antibiotics for 48 hours, and transition to ciprofloxacin with close outpatient follow-up.    Diabetes Patient was continued on his home insulin regimen, blood sugars well controlled during his hospital stay.  Gout Patient had right knee pain, swelling.  He was started on colchicine and NSAIDs and his symptoms improved.  His home allopurinol was continued.      Discharge Instructions  Discharge Instructions    Diet - low sodium heart healthy   Complete by: As directed    Discharge instructions   Complete by: As directed    From Dr.  Loleta Books: You were admitted with an infection of your surgical site.  You were treated with IV antibiotics. Go see [name] tomorrow at your scheduled appointment and then again next Friday as scheduled For the next week, take the antibiotic Cipro to finish treatment: Take ciprofloxacin/Cipro 500 mg twice daily  For the gout, you may take a few days of Aleve (take 1 tablet three times daily, then stop)   Resume your normal home insulin and blood pressure medicines. Call your primary care doctor and make sure you have an appointment with them in the next 1-2 weeks   Increase activity slowly   Complete by: As directed      Allergies as of 04/15/2019      Reactions   Other Hives   Anti-snake venim for copperhead venom.   Pravastatin Other (See Comments)   Leg cramps, elevated CK   Prednisone Other (See Comments)   Patient states "it runs my blood sugar up so I don't take it."   Toradol [ketorolac Tromethamine] Hives, Nausea And Vomiting   Pill form only      Medication List    STOP taking these medications   doxycycline 100 MG tablet Commonly known as: VIBRA-TABS   predniSONE 10 MG tablet Commonly known as: DELTASONE     TAKE these medications   allopurinol 300 MG tablet Commonly known as: ZYLOPRIM Take 300 mg by mouth daily.   amitriptyline 25 MG tablet Commonly known as: ELAVIL Take 25 mg by mouth at bedtime.   amLODipine 10 MG tablet Commonly known as: NORVASC Take 5 mg by mouth daily.   aspirin EC 81 MG tablet  Take 81 mg by mouth daily.   ciprofloxacin 500 MG tablet Commonly known as: Cipro Take 1 tablet (500 mg total) by mouth 2 (two) times daily for 8 days.   furosemide 20 MG tablet Commonly known as: LASIX Take 20 mg by mouth daily.   gabapentin 300 MG capsule Commonly known as: NEURONTIN Take 300 mg by mouth 3 (three) times daily.   Levemir FlexTouch 100 UNIT/ML FlexPen Generic drug: insulin detemir Inject 70 Units into the skin 2 (two) times  daily.   lisinopril 40 MG tablet Commonly known as: ZESTRIL Take 40 mg by mouth daily.   metoprolol succinate 100 MG 24 hr tablet Commonly known as: TOPROL-XL Take 100 mg by mouth daily.   Oxycodone HCl 10 MG Tabs Take 10 mg by mouth every 6 (six) hours.      Follow-up Information    [name], MD Follow up.   Specialty: Orthopedic Surgery Contact information: [place] [address] [phone]        [facility]. Schedule an appointment as soon as possible for a visit in 1 week(s).   Contact information: [place] [address] [place] Follow up.   Why: Resumption of home health services arranged         Allergies  Allergen Reactions  . Other Hives    Anti-snake venim for copperhead venom.  . Pravastatin Other (See Comments)    Leg cramps, elevated CK  . Prednisone Other (See Comments)    Patient states "it runs my blood sugar up so I don't take it."  . Toradol [Ketorolac Tromethamine] Hives and Nausea And Vomiting    Pill form only    Consultations:  Orthopedics   Procedures/Studies: MR FOOT LEFT WO CONTRAST  Result Date: 04/11/2019 CLINICAL DATA:  Left foot osteomyelitis. Prior amputation. EXAM: MRI OF THE LEFT FOOT WITHOUT CONTRAST TECHNIQUE: Multiplanar, multisequence MR imaging of the left foot was performed. No intravenous contrast was administered. COMPARISON:  X-ray left foot 03/25/2019, 03/29/2017 FINDINGS: Bones/Joint/Cartilage Prior transmetatarsal amputation of left foot through the proximal-mid diaphyses. Soft tissue wound at the amputation stump distally. Complex fluid collection with heterogeneous T1 and T2 hyperintensity in the soft tissues measuring approximately 5.9 x 1.8 x 2 cm draining through the wound. Marrow edema within the stumps of the first through fifth metatarsals most severe in the second and fifth metatarsals which may reflect reactive  edema given recent surgery versus osteomyelitis. No acute fracture or dislocation. Mild osteoarthritis of the talonavicular joint, calcaneocuboid joint, third TMT joint and fourth TMT joint. Subcortical marrow edema which is likely reactive along the medial aspect of the talus adjacent to the course of the posterior tibial tendon. Ligaments Lisfranc ligament is intact. Muscles and Tendons Severe tendinosis and a partial-thickness tear of the posterior tibial tendon. Intact flexor hallucis longus and flexor digitorum tendons. Mild tendinosis of the Achilles tendon with a small amount of fluid in the retrocalcaneal bursa. Mild tendinosis of the peroneus longus and peroneus brevis tendons. Extensor compartment tendons are intact. Generalized muscle atrophy. Soft tissue No fluid collection or hematoma. No soft tissue mass. Soft tissue wound at the amputation stump distally. Complex fluid collection in the soft tissues measuring approximately 5.9 x 1.8 x 2 cm draining through the wound. IMPRESSION: 1. Prior transmetatarsal amputation of the left foot through the proximal-mid diaphyses. Complex fluid collection in the soft tissues measuring approximately 5.9 x 1.8  x 2 cm draining through the wound concerning for a hematoma, but a secondarily infected hematoma or concomitant abscess cannot be excluded. Marrow edema within the stumps of the first through fifth metatarsals most severe in the second and fifth metatarsals which may reflect reactive edema given recent surgery versus osteomyelitis. 2. Severe tendinosis and a partial-thickness tear of the posterior tibial tendon with adjacent marrow edema in the medial talus. 3. Mild tendinosis of the Achilles tendon with a small amount of fluid in the retrocalcaneal bursa. Electronically Signed   By: Kathreen Devoid   On: 04/11/2019 09:02   DG Chest Port 1 View  Result Date: 04/11/2019 CLINICAL DATA:  Syncope. EXAM: PORTABLE CHEST 1 VIEW COMPARISON:  01/04/2019 FINDINGS: The  cardiomediastinal contours are normal. The lungs are clear. Pulmonary vasculature is normal. No consolidation, pleural effusion, or pneumothorax. No acute osseous abnormalities are seen. IMPRESSION: Negative chest radiograph. Electronically Signed   By: Keith Rake M.D.   On: 04/11/2019 02:55   DG Foot Complete Left  Result Date: 03/25/2019 CLINICAL DATA:  Possible foot infection.  Diabetic ulcer EXAM: LEFT FOOT - COMPLETE 3+ VIEW COMPARISON:  03/29/2017 FINDINGS: The first through third MTP joints are widened with sclerosis, spurring, and deformity of the adjacent bone. This is new from prior and new from a report at [facility] in 2020. The regional soft tissues are thickened. No opaque foreign body or fracture. Diffuse arterial calcification with osteopenia. IMPRESSION: Severe arthropathy of the first through third MTP joints that is new from 2020. 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 Electronically Signed   By: Monte Fantasia M.D.   On: 03/25/2019 10:52   VAS [place] ABI WITH/WO TBI  Result Date: 03/27/2019 LOWER EXTREMITY DOPPLER STUDY Indications: Gangrene.  Limitations: Today's exam was limited due to large toe circumference. Comparison Study: No prior study Performing Technologist: Maudry Mayhew MHA, RVT, RDCS, RDMS  Examination Guidelines: A complete evaluation includes at minimum, Doppler waveform signals and systolic blood pressure reading at the level of bilateral brachial, anterior tibial, and posterior tibial arteries, when vessel segments are accessible. Bilateral testing is considered an integral part of a complete examination. Photoelectric Plethysmograph (PPG) waveforms and toe systolic pressure readings are included as required and additional duplex testing as needed. Limited examinations for reoccurring indications may be performed as noted.  ABI Findings:  +---------+------------------+-----+---------+--------+ Right    Rt Pressure (mmHg)IndexWaveform Comment  +---------+------------------+-----+---------+--------+ Brachial 125                    triphasic         +---------+------------------+-----+---------+--------+ PTA      204               1.63 triphasic         +---------+------------------+-----+---------+--------+ DP       158               1.26 triphasic         +---------+------------------+-----+---------+--------+ Great Toe                       Present           +---------+------------------+-----+---------+--------+ +---------+------------------+-----+----------+-------+ Left     Lt Pressure (mmHg)IndexWaveform  Comment +---------+------------------+-----+----------+-------+ Brachial 123                    triphasic         +---------+------------------+-----+----------+-------+ PTA  145               1.16 monophasic        +---------+------------------+-----+----------+-------+ DP       182               1.46 triphasic         +---------+------------------+-----+----------+-------+ Great Toe                       Present           +---------+------------------+-----+----------+-------+ +-------+-----------+-----------+------------+------------+ ABI/TBIToday's ABIToday's TBIPrevious ABIPrevious TBI +-------+-----------+-----------+------------+------------+ Right  1.63                                           +-------+-----------+-----------+------------+------------+ Left   1.46                                           +-------+-----------+-----------+------------+------------+ Unable to obtain toe pressures due to great toe being too large for cuff.  Summary: Right: Resting right ankle-brachial index is elevated suggestive of medial arterial calcification. Left: Resting left ankle-brachial index is elevated suggestive of medial arterial calcification.  *See table(s)  above for measurements and observations.  Electronically signed by Harold Barban MD on 03/27/2019 at 7:06:28 PM.   Final    VAS [place] LOWER EXTREMITY VENOUS (DVT) (ONLY [facility] & [facility])  Result Date: 03/25/2019  Lower Venous DVTStudy Indications: Diabetic foot ulcer, and Swelling.  Limitations: Edema. Comparison Study: No prior study on file Performing Technologist: Sharion Dove RVS  Examination Guidelines: A complete evaluation includes B-mode imaging, spectral Doppler, color Doppler, and power Doppler as needed of all accessible portions of each vessel. Bilateral testing is considered an integral part of a complete examination. Limited examinations for reoccurring indications may be performed as noted. The reflux portion of the exam is performed with the patient in reverse Trendelenburg.  +-----+---------------+---------+-----------+----------+--------------+ RIGHTCompressibilityPhasicitySpontaneityPropertiesThrombus Aging +-----+---------------+---------+-----------+----------+--------------+ CFV  Full           Yes      Yes                                 +-----+---------------+---------+-----------+----------+--------------+   +---------+---------------+---------+-----------+----------+--------------+ LEFT     CompressibilityPhasicitySpontaneityPropertiesThrombus Aging +---------+---------------+---------+-----------+----------+--------------+ CFV      Full           Yes      Yes                                 +---------+---------------+---------+-----------+----------+--------------+ SFJ      Full                                                        +---------+---------------+---------+-----------+----------+--------------+ FV Prox  Full                                                        +---------+---------------+---------+-----------+----------+--------------+  FV Mid   Full                                                         +---------+---------------+---------+-----------+----------+--------------+ FV DistalFull                                                        +---------+---------------+---------+-----------+----------+--------------+ PFV      Full                                                        +---------+---------------+---------+-----------+----------+--------------+ POP      Full           Yes      Yes                                 +---------+---------------+---------+-----------+----------+--------------+ PTV      Full                                                        +---------+---------------+---------+-----------+----------+--------------+ PERO                                                  Not visualized +---------+---------------+---------+-----------+----------+--------------+     *See table(s) above for measurements and observations. Electronically signed by Monica Martinez MD on 03/25/2019 at 4:33:22 PM.    Final        Subjective: Patient's pain is improved, his right knee pain is better.  He has no fever, confusion, vomiting.  Discharge Exam: Vitals:   04/15/19 0812 04/15/19 0813  BP: 109/72 109/72  Pulse: 79 79  Resp: 20 20  Temp: 98.3 F (36.8 C) 98.3 F (36.8 C)  SpO2: 92% 94%   Vitals:   04/14/19 1921 04/15/19 0256 04/15/19 0812 04/15/19 0813  BP: 109/70 109/68 109/72 109/72  Pulse: 78 87 79 79  Resp: 17 17 20 20   Temp: 97.9 F (36.6 C) 98.2 F (36.8 C) 98.3 F (36.8 C) 98.3 F (36.8 C)  TempSrc: Oral Oral Oral Oral  SpO2: 95% 96% 92% 94%  Weight:      Height:        General: Pt is alert, awake, not in acute distress Cardiovascular: RRR, nl S1-S2, no murmurs appreciated.   No LE edema.   Respiratory: Normal respiratory rate and rhythm.  CTAB without rales or wheezes. Abdominal: Abdomen soft and non-tender.  No distension or HSM.   MSK:  The right foot has scant discharge on the dressing.  No redness or swelling around  the wound.  Neuro/Psych: Strength symmetric in upper and lower extremities.  Judgment and insight appear  normal.   The results of significant diagnostics from this hospitalization (including imaging, microbiology, ancillary and laboratory) are listed below for reference.     Microbiology: Recent Results (from the past 240 hour(s))  Blood Culture (routine x 2)     Status: None (Preliminary result)   Collection Time: 04/11/19  2:16 AM   Specimen: BLOOD  Result Value Ref Range Status   Specimen Description BLOOD LEFT ARM  Final   Special Requests   Final    BOTTLES DRAWN AEROBIC AND ANAEROBIC Blood Culture results may not be optimal due to an excessive volume of blood received in culture bottles   Culture   Final    NO GROWTH 4 DAYS Performed at [facility], [place] [address] [place], [place] [address]    Report Status PENDING  Incomplete  Blood Culture (routine x 2)     Status: None (Preliminary result)   Collection Time: 04/11/19  2:59 AM   Specimen: BLOOD  Result Value Ref Range Status   Specimen Description BLOOD RIGHT HAND  Final   Special Requests   Final    BOTTLES DRAWN AEROBIC AND ANAEROBIC Blood Culture adequate volume   Culture   Final    NO GROWTH 4 DAYS Performed at [facility], [place] [address], [place], [place] [address]    Report Status PENDING  Incomplete  SARS CORONAVIRUS 2 (TAT 6-24 HRS) Nasopharyngeal Nasopharyngeal Swab     Status: None   Collection Time: 04/11/19  3:56 AM   Specimen: Nasopharyngeal Swab  Result Value Ref Range Status   SARS Coronavirus 2 NEGATIVE NEGATIVE Final    Comment: (NOTE) SARS-CoV-2 target nucleic acids are NOT DETECTED. The SARS-CoV-2 RNA is generally detectable in upper and lower respiratory specimens during the acute phase of infection. Negative results do not preclude SARS-CoV-2 infection, do not rule out co-infections with other pathogens, and should not be used as the sole basis for treatment or other patient  management decisions. Negative results must be combined with clinical observations, patient history, and epidemiological information. The expected result is Negative. Fact Sheet for Patients: SugarRoll.be Fact Sheet for Healthcare Providers: https://www.woods-mathews.com/ This test is not yet approved or cleared by the [place] FDA and  has been authorized for detection and/or diagnosis of SARS-CoV-2 by FDA under an Emergency Use Authorization (EUA). This EUA will remain  in effect (meaning this test can be used) for the duration of the COVID-19 declaration under Section 56 4(b)(1) of the Act, 21 U.S.C. section 360bbb-3(b)(1), unless the authorization is terminated or revoked sooner. Performed at [facility], [place] [address], [place], [place] [address]   Urine culture     Status: None   Collection Time: 04/11/19  6:52 AM   Specimen: In/Out Cath Urine  Result Value Ref Range Status   Specimen Description IN/OUT CATH URINE  Final   Special Requests NONE  Final   Culture   Final    NO GROWTH Performed at [facility], [place] [address], [place],  [address]    Report Status 04/12/2019 FINAL  Final     Labs: BNP (last 3 results) No results for input(s): BNP in the last 8760 hours. Basic Metabolic Panel: Recent Labs  Lab 04/11/19 0032 04/12/19 0447 04/13/19 0557 04/15/19 0219  NA 138 141 138 137  K 4.1 4.3 4.2 4.5  CL 108 107 106 102  CO2 19* 25 22 24   GLUCOSE 215* 141* 144* 155*  BUN 13  11 14 24*  CREATININE 1.04 1.01 0.95 1.08  CALCIUM 9.2 9.2 9.0 8.9   Liver Function Tests: Recent Labs  Lab 04/11/19 0032 04/12/19 0447  AST 13* 11*  ALT 10 10  ALKPHOS 62 53  BILITOT 0.7 0.6  PROT 7.4 6.3*  ALBUMIN 3.5 3.0*   No results for input(s): LIPASE, AMYLASE in the last 168 hours. No results for input(s): AMMONIA in the last 168 hours. CBC: Recent Labs  Lab 04/11/19 0032 04/12/19 0447 04/13/19 0557  04/15/19 0219  WBC 13.4* 6.2 5.3 5.2  NEUTROABS 11.9* 3.9  --   --   HGB 9.8* 8.8* 8.6* 8.8*  HCT 31.0* 27.5* 26.7* 27.5*  MCV 97.8 95.8 95.0 95.8  PLT 173 123* 129* 132*   Cardiac Enzymes: No results for input(s): CKTOTAL, CKMB, CKMBINDEX, TROPONINI in the last 168 hours. BNP: Invalid input(s): POCBNP CBG: Recent Labs  Lab 04/14/19 1142 04/14/19 1716 04/14/19 2055 04/15/19 0625 04/15/19 1211  GLUCAP 185* 126* 185* 146* 160*   D-Dimer No results for input(s): DDIMER in the last 72 hours. Hgb A1c No results for input(s): HGBA1C in the last 72 hours. Lipid Profile No results for input(s): CHOL, HDL, LDLCALC, TRIG, CHOLHDL, LDLDIRECT in the last 72 hours. Thyroid function studies No results for input(s): TSH, T4TOTAL, T3FREE, THYROIDAB in the last 72 hours.  Invalid input(s): FREET3 Anemia work up No results for input(s): VITAMINB12, FOLATE, FERRITIN, TIBC, IRON, RETICCTPCT in the last 72 hours. Urinalysis    Component Value Date/Time   COLORURINE YELLOW 04/11/2019 0030   APPEARANCEUR CLEAR 04/11/2019 0030   LABSPEC 1.021 04/11/2019 0030   PHURINE 5.0 04/11/2019 0030   GLUCOSEU >=500 (A) 04/11/2019 0030   HGBUR NEGATIVE 04/11/2019 0030   BILIRUBINUR NEGATIVE 04/11/2019 0030   KETONESUR NEGATIVE 04/11/2019 0030   PROTEINUR 100 (A) 04/11/2019 0030   NITRITE NEGATIVE 04/11/2019 0030   LEUKOCYTESUR NEGATIVE 04/11/2019 0030   Sepsis Labs Invalid input(s): PROCALCITONIN,  WBC,  LACTICIDVEN Microbiology Recent Results (from the past 240 hour(s))  Blood Culture (routine x 2)     Status: None (Preliminary result)   Collection Time: 04/11/19  2:16 AM   Specimen: BLOOD  Result Value Ref Range Status   Specimen Description BLOOD LEFT ARM  Final   Special Requests   Final    BOTTLES DRAWN AEROBIC AND ANAEROBIC Blood Culture results may not be optimal due to an excessive volume of blood received in culture bottles   Culture   Final    NO GROWTH 4 DAYS Performed at [facility], [place] [address], [place], [place] [address]    Report Status PENDING  Incomplete  Blood Culture (routine x 2)     Status: None (Preliminary result)   Collection Time: 04/11/19  2:59 AM   Specimen: BLOOD  Result Value Ref Range Status   Specimen Description BLOOD RIGHT HAND  Final   Special Requests   Final    BOTTLES DRAWN AEROBIC AND ANAEROBIC Blood Culture adequate volume   Culture   Final    NO GROWTH 4 DAYS Performed at [facility], [place] [address] [place], [place] [address]    Report Status PENDING  Incomplete  SARS CORONAVIRUS 2 (TAT 6-24 HRS) Nasopharyngeal Nasopharyngeal Swab     Status: None   Collection Time: 04/11/19  3:56 AM   Specimen: Nasopharyngeal Swab  Result Value Ref Range Status   SARS Coronavirus 2 NEGATIVE NEGATIVE Final    Comment: (NOTE) SARS-CoV-2 target nucleic  acids are NOT DETECTED. The SARS-CoV-2 RNA is generally detectable in upper and lower respiratory specimens during the acute phase of infection. Negative results do not preclude SARS-CoV-2 infection, do not rule out co-infections with other pathogens, and should not be used as the sole basis for treatment or other patient management decisions. Negative results must be combined with clinical observations, patient history, and epidemiological information. The expected result is Negative. Fact Sheet for Patients: SugarRoll.be Fact Sheet for Healthcare Providers: https://www.woods-mathews.com/ This test is not yet approved or cleared by the [place] FDA and  has been authorized for detection and/or diagnosis of SARS-CoV-2 by FDA under an Emergency Use Authorization (EUA). This EUA will remain  in effect (meaning this test can be used) for the duration of the COVID-19 declaration under Section 56 4(b)(1) of the Act, 21 U.S.C. section 360bbb-3(b)(1), unless the authorization is terminated or revoked sooner. Performed at [facility], [place] [address], [place], [place] [address]   Urine culture     Status: None   Collection Time: 04/11/19  6:52 AM   Specimen: In/Out Cath Urine  Result Value Ref Range Status   Specimen Description IN/OUT CATH URINE  Final   Special Requests NONE  Final   Culture   Final    NO GROWTH Performed at [facility], [place] [address] [place], [place] [address]    Report Status 04/12/2019 FINAL  Final     Time coordinating discharge: 25 minutes The [place] controlled substances registry was reviewed for this patient       SIGNED:   Edwin Dada, MD  [facility] 04/15/2019, 5:37 PM

## 2019-04-16 LAB — CULTURE, BLOOD (ROUTINE X 2)
Culture: NO GROWTH
Culture: NO GROWTH
Special Requests: ADEQUATE

## 2019-04-19 ENCOUNTER — Ambulatory Visit: Payer: Medicaid Other | Admitting: Physician Assistant

## 2019-04-20 ENCOUNTER — Telehealth: Payer: Self-pay | Admitting: Orthopedic Surgery

## 2019-04-20 NOTE — Telephone Encounter (Signed)
Received call from Christy-nurse with [facility] needing verbal orders for skilled nursing services 3 Wk 1 and 2 wk 2. The number to contact Alyse Low is [phone] Opt: 2

## 2019-04-21 NOTE — Telephone Encounter (Signed)
Called and sw Lucky with [facility]. Pt is s/p a left transmet 03/27/19 gave verbal ok for frequency and duration of HHN visits. Pt has an appt on Friday will call with update if needed.

## 2019-04-23 ENCOUNTER — Encounter: Payer: Self-pay | Admitting: Physician Assistant

## 2019-04-23 ENCOUNTER — Other Ambulatory Visit: Payer: Self-pay

## 2019-04-23 ENCOUNTER — Ambulatory Visit (INDEPENDENT_AMBULATORY_CARE_PROVIDER_SITE_OTHER): Payer: Medicaid Other | Admitting: Physician Assistant

## 2019-04-23 VITALS — Ht 74.0 in | Wt 260.0 lb

## 2019-04-23 DIAGNOSIS — M86272 Subacute osteomyelitis, left ankle and foot: Secondary | ICD-10-CM

## 2019-04-23 MED ORDER — NITROGLYCERIN 0.2 MG/HR TD PT24: 0.2000 mg | MEDICATED_PATCH | Freq: Every day | TRANSDERMAL | 1 refills | Status: DC

## 2019-04-23 MED ORDER — CIPROFLOXACIN HCL 500 MG PO TABS: 500.0000 mg | ORAL_TABLET | Freq: Two times a day (BID) | ORAL | 0 refills | Status: AC

## 2019-04-23 NOTE — Progress Notes (Signed)
Office Visit Note   Patient: [name]           Date of Birth: [birth year]           MRN: [id number] Visit Date: 04/23/2019              Requested by: [name] [facility] [place],  [place] [id number] PCP: [facility]  Chief Complaint  Patient presents with  . Left Foot - Routine Post Op    03/27/19 left transmet amputation       HPI: This is a pleasant gentleman who is here for follow-up on his left transmetatarsal amputation.  This was done on 3/13.  He really presented to the ER for for a wound infection.  He is currently on ciprofloxacin and having daily dressing changes.  He is elevating his foot above heart level per his report  Assessment & Plan: Visit Diagnoses: No diagnosis found.  Plan: Continue with daily dressing changes I have added a nitroglycerin patch to see if this improves some of the microcirculation.  He will follow-up in 1 week. I am concerned with the significant swelling he seems to have despite his report of elevation above heart level. Follow-Up Instructions: No follow-ups on file.   Ortho Exam  Patient is alert, oriented, no adenopathy, well-dressed, normal affect, normal respiratory effort. He has a significant amount of soft tissue swelling.  Mild erythema adjacent to the wound does not ascend into the foot he does has had some wound dehiscence.  Most significant wound dehiscence is on the incision going to the plantar surface of his foot.  There is fibrofibrinous gray tissue in this area.  Better bleeding across the end of the amputation stump no foul odor he does have biphasic pulses by Doppler  Imaging: No results found. No images are attached to the encounter.  Labs: Lab Results  Component Value Date   HGBA1C 7.2 (H) 03/25/2019   HGBA1C 7.1 (H) 03/28/2017   ESRSEDRATE 81 (H) 03/25/2019   CRP 1.4 (H) 04/11/2019   CRP 1.9 (H) 03/25/2019   LABURIC 5.6 04/13/2019   REPTSTATUS 04/12/2019  FINAL 04/11/2019   GRAMSTAIN  03/27/2019    RARE WBC PRESENT,BOTH PMN AND MONONUCLEAR NO ORGANISMS SEEN    CULT  04/11/2019    NO GROWTH Performed at [facility], [place] [address] [place], [place] [address]    LABORGA ENTEROBACTER AEROGENES 03/27/2019     Lab Results  Component Value Date   ALBUMIN 3.0 (L) 04/12/2019   ALBUMIN 3.5 04/11/2019   ALBUMIN 3.7 03/25/2019   PREALBUMIN 17.6 (L) 03/25/2019   LABURIC 5.6 04/13/2019    No results found for: MG No results found for: VD25OH  Lab Results  Component Value Date   PREALBUMIN 17.6 (L) 03/25/2019   CBC EXTENDED Latest Ref Rng & Units 04/15/2019 04/13/2019 04/12/2019  WBC 4.0 - 10.5 K/uL 5.2 5.3 6.2  RBC 4.22 - 5.81 MIL/uL 2.87(L) 2.81(L) 2.87(L)  HGB 13.0 - 17.0 g/dL 8.8(L) 8.6(L) 8.8(L)  HCT 39.0 - 52.0 % 27.5(L) 26.7(L) 27.5(L)  PLT 150 - 400 K/uL 132(L) 129(L) 123(L)  NEUTROABS 1.7 - 7.7 K/uL - - 3.9  LYMPHSABS 0.7 - 4.0 K/uL - - 1.7     Body mass index is 33.38 kg/m.  Orders:  No orders of the defined types were placed in this encounter.  Meds ordered this encounter  Medications  . ciprofloxacin (CIPRO) 500 MG tablet    Sig:  Take 1 tablet (500 mg total) by mouth 2 (two) times daily for 8 days.    Dispense:  16 tablet    Refill:  0  . nitroGLYCERIN (NITRODUR - DOSED IN MG/24 HR) 0.2 mg/hr patch    Sig: Place 1 patch (0.2 mg total) onto the skin daily. Apply new patch remove old patch daily on foot . Place in different place on top of foot    Dispense:  30 patch    Refill:  1     Procedures: No procedures performed  Clinical Data: No additional findings.  ROS:  All other systems negative, except as noted in the HPI. Review of Systems  Objective: Vital Signs: Ht 6' 2"  (1.88 m)   Wt 260 lb (117.9 kg)   BMI 33.38 kg/m   Specialty Comments:  No specialty comments available.  PMFS History: Patient Active Problem List   Diagnosis Date Noted  . Wound infection   . Acute osteomyelitis of  left foot ([place]) 04/11/2019  . Lactic acidosis 04/11/2019  . Sepsis due to Enterobacter ([place]) 04/11/2019  . Diabetic foot ulcer ([place]) 03/25/2019  . Idiopathic chronic gout of left foot without tophus   . Myelopathy ([place]) 07/16/2013  . Ingrown nail 07/05/2013  . Paronychia 07/05/2013  . Diverticulosis 04/22/2013  . Acute osteomyelitis of humerus ([place]) 02/07/2013  . Osteomyelitis of left foot ([place]) 02/01/2013  . Joint infection ([place]) 01/22/2013  . Pyogenic arthritis of shoulder region ([place]) 01/15/2013  . Nephrolithiasis 01/23/2012  . Lumbar radiculopathy 02/28/2011  . Acute gout of right knee 04/30/2010  . Chronic pain 02/14/2010  . NASH (nonalcoholic steatohepatitis) 11/08/2008  . Obesity 10/27/2008  . Hypertriglyceridemia 10/27/2007  . Uncontrolled type 2 diabetes mellitus with hyperglycemia, with long-term current use of insulin ([place]) 03/12/2006  . Essential hypertension, benign 03/12/2006   Past Medical History:  Diagnosis Date  . Arthritis   . Back pain   . Diabetes mellitus without complication ([place])   . Diabetic foot ulcer ([place]) 03/2019  . Hypertension     Family History  Problem Relation Age of Onset  . Heart failure Mother   . Cancer Father     Past Surgical History:  Procedure Laterality Date  . BACK SURGERY    . CARPAL TUNNEL RELEASE    . CHOLECYSTECTOMY    . FOOT SURGERY    . JOINT REPLACEMENT    . SHOULDER ARTHROSCOPY    . TOTAL KNEE ARTHROPLASTY    . TRANSMETATARSAL AMPUTATION Left 03/27/2019   Procedure: TRANSMETATARSAL AMPUTATION left foot;  Surgeon: [name], MD;  Location: [place];  Service: Orthopedics;  Laterality: Left;   Social History   Occupational History  . Not on file  Tobacco Use  . Smoking status: Never Smoker  . Smokeless tobacco: Never Used  Substance and Sexual Activity  . Alcohol use: No  . Drug use: No  . Sexual activity: Not on file

## 2019-04-28 NOTE — Progress Notes (Signed)
Darrell, [name] ([id number]) Visit Report for 02/25/2019 Arrival Information Details Patient Name: Date of Service: Darrell, [name] 02/25/2019 1:30 PM Medical Record [id number] Patient Account Number: [id number] Date of Birth/Sex: Treating RN: [birth year] (64 y.o. [name] Primary Care Tyrell Brereton: SYSTEM, PCP Other Clinician: Referring Shawntez Dickison: Treating Grayton Lobo/Extender:[name], [name] in Treatment: 2 Visit Information History Since Last Visit Added or deleted any medications: No Patient Arrived: Ambulatory Any new allergies or adverse reactions: No Arrival Time: 13:58 Had a fall or experienced change in No Accompanied By: social activities of daily living that may affect worker risk of falls: Transfer Assistance: None Signs or symptoms of abuse/neglect since last No Patient Identification Verified: Yes visito Secondary Verification Process Completed: Yes Hospitalized since last visit: No Patient Requires Transmission-Based No Implantable device outside of the clinic excluding No Precautions: cellular tissue based products placed in the center Patient Has Alerts: No since last visit: Has Dressing in Place as Prescribed: Yes Pain Present Now: Yes Electronic Signature(s) Signed: 04/28/2019 9:23:26 AM By: Sandre Kitty Entered By: Sandre Kitty on 02/25/2019 13:58:33 -------------------------------------------------------------------------------- Encounter Discharge Information Details Patient Name: Date of Service: Darrell, [name] 02/25/2019 1:30 PM Medical Record [id number] Patient Account Number: [id number] Date of Birth/Sex: Treating RN: 09/20/1955 (64 y.o. [name] Primary Care Adelaide Pfefferkorn: SYSTEM, PCP Other Clinician: Referring Tieisha Darden: Treating Jaskirat Zertuche/Extender:[name], [name] in Treatment: 2 Encounter Discharge Information Items Post Procedure Vitals Discharge Condition: Stable Temperature (F): 98.8 Ambulatory  Status: Ambulatory Pulse (bpm): 77 Discharge Destination: Home Respiratory Rate (breaths/min): 18 Transportation: Private Auto Blood Pressure (mmHg): 152/87 Accompanied By: Education officer, museum Schedule Follow-up Appointment: Yes Clinical Summary of Care: Patient Declined Electronic Signature(s) Signed: 02/26/2019 5:30:09 PM By: Baruch Gouty RN, BSN Entered By: Baruch Gouty on 02/25/2019 15:04:20 -------------------------------------------------------------------------------- Lower Extremity Assessment Details Patient Name: Date of Service: Darrell, [name] 02/25/2019 1:30 PM Medical Record [id number] Patient Account Number: [id number] Date of Birth/Sex: Treating RN: 1955/12/10 (64 y.o. [name] Primary Care Nicolus Ose: SYSTEM, PCP Other Clinician: Referring Bryn Saline: Treating Lashawn Orrego/Extender:[name], [name] in Treatment: 2 Edema Assessment Assessed: [Left: No] [Right: No] Edema: [Left: Ye] [Right: s] Calf Left: Right: Point of Measurement: 42 cm From Medial Instep 48 cm cm Ankle Left: Right: Point of Measurement: 13 cm From Medial Instep 26 cm cm Vascular Assessment Pulses: Dorsalis Pedis Palpable: [Left:Yes] Electronic Signature(s) Signed: 03/01/2019 6:14:31 PM By: Levan Hurst RN, BSN Entered By: Levan Hurst on 02/25/2019 14:15:17 -------------------------------------------------------------------------------- Multi Wound Chart Details Patient Name: Date of Service: Darrell, [name] 02/25/2019 1:30 PM Medical Record [id number] Patient Account Number: [id number] Date of Birth/Sex: Treating RN: Jan 17, 1955 (64 y.o. [name] Primary Care Edi Gorniak: SYSTEM, PCP Other Clinician: Referring Tilden Broz: Treating Dequita Schleicher/Extender:[name], [name] in Treatment: 2 Vital Signs Height(in): 74 Capillary Blood 106 Glucose(mg/dl): Weight(lbs): 238 Pulse(bpm): 73 Body Mass Index(BMI): 31 Blood Pressure(mmHg): 152/87 Temperature(F):  98.8 Respiratory 18 Rate(breaths/min): Photos: [1:No Photos] [N/A:N/A] Wound Location: [1:Left Foot - Plantar] [N/A:N/A] Wounding Event: [1:Blister] [N/A:N/A] Primary Etiology: [1:Diabetic Wound/Ulcer of the N/A Lower Extremity] Comorbid History: [1:Hypertension, Type II Diabetes] [N/A:N/A] Date Acquired: [1:01/14/2017] [N/A:N/A] Weeks of Treatment: [1:2] [N/A:N/A] Wound Status: [1:Open] [N/A:N/A] Measurements L x W x D 0.5x0.3x0.6 [N/A:N/A] (cm) Area (cm) : [1:0.118] [N/A:N/A] Volume (cm) : [1:0.071] [N/A:N/A] % Reduction in Area: [1:24.80%] [N/A:N/A] % Reduction in Volume: -12.70% [N/A:N/A] Starting Position 1 12 (o'clock): Ending Position 1 [1:12] (o'clock): Maximum Distance 1 [1:0.8] (cm): Undermining: [1:Yes] [N/A:N/A] Classification: [1:Grade 3] [N/A:N/A] Exudate Amount: [1:Medium] [N/A:N/A] Exudate Type: [1:Serosanguineous] [N/A:N/A] Exudate Color: [1:red, brown] [N/A:N/A] Wound Margin: [1:Flat and Intact] [  N/A:N/A] Granulation Amount: [1:Large (67-100%)] [N/A:N/A] Granulation Quality: [1:Pink, Pale] [N/A:N/A] Necrotic Amount: [1:None Present (0%)] [N/A:N/A] Exposed Structures: [1:Fat Layer (Subcutaneous N/A Tissue) Exposed: Yes Fascia: No Tendon: No Muscle: No Joint: No Bone: No] Epithelialization: [1:Medium (34-66%)] [N/A:N/A] Debridement: [1:Debridement - Excisional N/A] Pre-procedure [1:14:35] [N/A:N/A] Verification/Time Out Taken: Pain Control: [1:Lidocaine 4% Topical Solution] [N/A:N/A] Tissue Debrided: [1:Subcutaneous] [N/A:N/A] Level: [1:Skin/Subcutaneous Tissue] Debridement Area (sq cm):1 Instrument: [1:Curette] Bleeding: [1:Minimum] Hemostasis Achieved: [1:Pressure] Procedural Pain: [1:0] Post Procedural Pain: [1:0] Debridement Treatment Procedure was tolerated Response: [1:well] Post Debridement [1:0.5x0.5x0.5] Measurements L x W x D (cm) Post Debridement [1:0.098] Volume: (cm) Procedures Performed: Debridement Treatment Notes Electronic  Signature(s) Signed: 02/25/2019 5:57:11 PM By: Deon Pilling Signed: 02/26/2019 1:06:39 PM By: [name] MD Entered By: [name] on 02/25/2019 14:50:14 -------------------------------------------------------------------------------- Multi-Disciplinary Care Plan Details Patient Name: Date of Service: Darrell, [name] 02/25/2019 1:30 PM Medical Record [id number] Patient Account Number: [id number] Date of Birth/Sex: Treating RN: December 16, 1955 (64 y.o. [name] Primary Care Bueford Arp: SYSTEM, PCP Other Clinician: Referring Vishruth Seoane: Treating Faige Seely/Extender:[name], [name] in Treatment: 2 Active Inactive Nutrition Nursing Diagnoses: Impaired glucose control: actual or potential Potential for alteratiion in Nutrition/Potential for imbalanced nutrition Goals: Patient/caregiver agrees to and verbalizes understanding of need to use nutritional supplements and/or vitamins as prescribed Date Initiated: 02/08/2019 Date Inactivated: 02/25/2019 Target Resolution Date: 03/12/2019 Goal Status: Met Patient/caregiver will maintain therapeutic glucose control Date Initiated: 02/08/2019 Target Resolution Date: 03/12/2019 Goal Status: Active Interventions: Assess HgA1c results as ordered upon admission and as needed Assess patient nutrition upon admission and as needed per policy Provide education on elevated blood sugars and impact on wound healing Provide education on nutrition Treatment Activities: Education provided on Nutrition : 02/08/2019 Notes: Wound/Skin Impairment Nursing Diagnoses: Impaired tissue integrity Knowledge deficit related to ulceration/compromised skin integrity Goals: Patient/caregiver will verbalize understanding of skin care regimen Date Initiated: 02/08/2019 Date Inactivated: 02/25/2019 Target Resolution Date: 03/12/2019 Goal Status: Met Ulcer/skin breakdown will have a volume reduction of 30% by week 4 Date Initiated: 02/08/2019 Target  Resolution Date: 03/12/2019 Goal Status: Active Interventions: Assess patient/caregiver ability to obtain necessary supplies Assess patient/caregiver ability to perform ulcer/skin care regimen upon admission and as needed Assess ulceration(s) every visit Provide education on ulcer and skin care Notes: Electronic Signature(s) Signed: 02/25/2019 5:57:11 PM By: Deon Pilling Entered By: Deon Pilling on 02/25/2019 14:16:33 -------------------------------------------------------------------------------- Pain Assessment Details Patient Name: Date of Service: Darrell, [name] 02/25/2019 1:30 PM Medical Record [id number] Patient Account Number: [id number] Date of Birth/Sex: Treating RN: 01-12-1956 (64 y.o. [name] Primary Care Jahleah Mariscal: SYSTEM, PCP Other Clinician: Referring Arabell Neria: Treating Asli Tokarski/Extender:[name], [name] in Treatment: 2 Active Problems Location of Pain Severity and Description of Pain Patient Has Paino No Site Locations Pain Management and Medication Current Pain Management: Electronic Signature(s) Signed: 02/25/2019 5:57:11 PM By: Deon Pilling Signed: 04/28/2019 9:23:26 AM By: Sandre Kitty Entered By: Sandre Kitty on 02/25/2019 13:59:12 -------------------------------------------------------------------------------- Patient/Caregiver Education Details Patient Name: Date of Service: Darrell, [name] 2/11/2021andnbsp1:30 PM Medical Record [id number] Patient Account Number: [id number] Date of Birth/Gender: Jul 13, 1955 (63 y.o. M) Treating RN: Deon Pilling Primary Care Physician: SYSTEM, PCP Other Clinician: Referring Physician: Treating Physician/Extender:[name], [name] in Treatment: 2 Education Assessment Education Provided To: Patient Education Topics Provided Elevated Blood Sugar/ Impact on Healing: Handouts: Elevated Blood Sugars: How Do They Affect Wound Healing Methods: Explain/Verbal Responses: Reinforcements  needed Electronic Signature(s) Signed: 02/25/2019 5:57:11 PM By: Deon Pilling Entered By: Deon Pilling on 02/25/2019 14:16:48 -------------------------------------------------------------------------------- Wound Assessment Details Patient Name: Date of Service: Darrell,  [name] 02/25/2019 1:30 PM Medical Record [id number] Patient Account Number: [id number] Date of Birth/Sex: Treating RN: 04/03/1955 (64 y.o. [name] Primary Care Marcelino Campos: SYSTEM, PCP Other Clinician: Referring Laurynn Mccorvey: Treating Prim Morace/Extender:[name], [name] in Treatment: 2 Wound Status Wound Number: 1 Primary Diabetic Wound/Ulcer of the Lower Etiology: Extremity Wound Location: Left Foot - Plantar Wound Status: Open Wounding Event: Blister Comorbid Hypertension, Type II Diabetes Date Acquired: 01/14/2017 History: Weeks Of Treatment: 2 Clustered Wound: No Photos Wound Measurements Length: (cm) 0.5 % Reduction in Width: (cm) 0.3 % Reduction in Depth: (cm) 0.6 Epithelializati Area: (cm) 0.118 Tunneling: Volume: (cm) 0.071 Undermining: Starting Pos Ending Posit Maximum Dist Area: 24.8% Volume: -12.7% on: Medium (34-66%) No Yes ition (o'clock): 12 ion (o'clock): 12 ance: (cm) 0.8 Wound Description Classification: Grade 3 Foul Odor Afte Wound Margin: Flat and Intact Slough/Fibrino Exudate Amount: Medium Exudate Type: Serosanguineous Exudate Color: red, brown Wound Bed Granulation Amount: Large (67-100%) Granulation Quality: Pink, Pale Fascia Exposed: Necrotic Amount: None Present (0%) Fat Layer (Subc Tendon Exposed: Muscle Exposed: Joint Exposed: Bone Exposed: Electronic Signature(s) Signed: 02/26/2019 4:39:06 PM By: [name] EMT/HBOT Signed: 02/26/2019 5:41:48 PM By: Deon Pilling Previous Signature: 02/25/2019 5:57:11 PM Version By: Lady Deutscher Entered By: [name] on 02/12 r Cleansing: No No Exposed Structure No utaneous Tissue) Exposed:  Yes No No No No i /2021 15:02:32 -------------------------------------------------------------------------------- Vitals Details Patient Name: Date of Service: Darrell, [name] 02/25/2019 1:30 PM Medical Record [id number] Patient Account Number: [id number] Date of Birth/Sex: Treating RN: 1955-07-20 (64 y.o. [name] Primary Care Mikeisha Lemonds: SYSTEM, PCP Other Clinician: Referring Saaya Procell: Treating Tory Mckissack/Extender:[name], [name] in Treatment: 2 Vital Signs Time Taken: 13:58 Temperature (F): 98.8 Height (in): 74 Pulse (bpm): 73 Weight (lbs): 238 Respiratory Rate (breaths/min): 18 Body Mass Index (BMI): 30.6 Blood Pressure (mmHg): 152/87 Capillary Blood Glucose (mg/dl): 106 Reference Range: 80 - 120 mg / dl Electronic Signature(s) Signed: 04/28/2019 9:23:26 AM By: Sandre Kitty Entered By: Sandre Kitty on 02/25/2019 13:58:59

## 2019-04-29 ENCOUNTER — Ambulatory Visit: Payer: Medicaid Other | Admitting: Physician Assistant

## 2019-05-06 ENCOUNTER — Telehealth: Payer: Self-pay | Admitting: Orthopedic Surgery

## 2019-05-06 NOTE — Telephone Encounter (Signed)
Arbie Cookey from [facility] called.   Requesting wound care orders for the patient.   Call back: [phone]

## 2019-05-06 NOTE — Telephone Encounter (Signed)
Called and sw HHN and she wanted to apply xeroform to the incision and a dry dressing on top of that. Advised this is ok will see the pt three times a week.

## 2019-05-13 ENCOUNTER — Encounter: Payer: Self-pay | Admitting: Physician Assistant

## 2019-05-13 ENCOUNTER — Other Ambulatory Visit: Payer: Self-pay

## 2019-05-13 ENCOUNTER — Ambulatory Visit (INDEPENDENT_AMBULATORY_CARE_PROVIDER_SITE_OTHER): Payer: Medicaid Other | Admitting: Physician Assistant

## 2019-05-13 VITALS — Ht 74.0 in | Wt 260.0 lb

## 2019-05-13 DIAGNOSIS — M86172 Other acute osteomyelitis, left ankle and foot: Secondary | ICD-10-CM

## 2019-05-13 MED ORDER — PENTOXIFYLLINE ER 400 MG PO TBCR: 400.0000 mg | EXTENDED_RELEASE_TABLET | Freq: Three times a day (TID) | ORAL | 3 refills | Status: DC

## 2019-05-13 MED ORDER — MUPIROCIN 2 % EX OINT: 1.0000 "application " | TOPICAL_OINTMENT | Freq: Two times a day (BID) | CUTANEOUS | 6 refills | Status: DC

## 2019-05-13 NOTE — Progress Notes (Signed)
Office Visit Note   Patient: [name]           Date of Birth: Sep 18, 1955           MRN: [id number] Visit Date: 05/13/2019              Requested by: [name] [facility] [place],  [place] [address] PCP: [facility], [facility]  Chief Complaint  Patient presents with  . Left Foot - Routine Post Op    03/27/19 left transmet amputation       HPI: This is a pleasant gentleman who is now 6 weeks status post left transmetatarsal amputation.  He has had difficulty with healing the wound especially in the central portion.  He is currently having Xeroform applied by visiting nurse.  He states that he has been told by them that he can place full weight on it.  He has recently obtained nitroglycerin patches.  But unsure how to use them.  Assessment & Plan: Visit Diagnoses: No diagnosis found.  Plan: Patient was seen today directly by [name].  We do have concerns for wound healing.  He should not be placing any weight on this foot and should use crutches or a walker.  Daily dressing changes with Bactroban should be done.  We demonstrated how to use the nitroglycerin patches to him.  He will also begin Trental.  He will follow-up in 1 week for assessment.  Follow-Up Instructions: No follow-ups on file.   Ortho Exam  Patient is alert, oriented, no adenopathy, well-dressed, normal affect, normal respiratory effort. Left foot: Status post left transmetatarsal amputation.  He has some skin maceration the periphery of the wound is healed.  The central portion he has an area of granulation and dehiscence that is approximately 3 cm wide and 4 to 5 cm long.  There is no foul odor or no purulent drainage.  The granulation tissue does appear healthy wound edges are macerated.  He does have a strong dorsalis pedis pulse that is easily palpable no surrounding cellulitis  Imaging: No results found. No images are attached to the encounter.  Labs: Lab  Results  Component Value Date   HGBA1C 7.2 (H) 03/25/2019   HGBA1C 7.1 (H) 03/28/2017   ESRSEDRATE 81 (H) 03/25/2019   CRP 1.4 (H) 04/11/2019   CRP 1.9 (H) 03/25/2019   LABURIC 5.6 04/13/2019   REPTSTATUS 04/12/2019 FINAL 04/11/2019   GRAMSTAIN  03/27/2019    RARE WBC PRESENT,BOTH PMN AND MONONUCLEAR NO ORGANISMS SEEN    CULT  04/11/2019    NO GROWTH Performed at [facility], [place] [address] [place], [place] [address]    LABORGA ENTEROBACTER AEROGENES 03/27/2019     Lab Results  Component Value Date   ALBUMIN 3.0 (L) 04/12/2019   ALBUMIN 3.5 04/11/2019   ALBUMIN 3.7 03/25/2019   PREALBUMIN 17.6 (L) 03/25/2019   LABURIC 5.6 04/13/2019    No results found for: MG No results found for: VD25OH  Lab Results  Component Value Date   PREALBUMIN 17.6 (L) 03/25/2019   CBC EXTENDED Latest Ref Rng & Units 04/15/2019 04/13/2019 04/12/2019  WBC 4.0 - 10.5 K/uL 5.2 5.3 6.2  RBC 4.22 - 5.81 MIL/uL 2.87(L) 2.81(L) 2.87(L)  HGB 13.0 - 17.0 g/dL 8.8(L) 8.6(L) 8.8(L)  HCT 39.0 - 52.0 % 27.5(L) 26.7(L) 27.5(L)  PLT 150 - 400 K/uL 132(L) 129(L) 123(L)  NEUTROABS 1.7 - 7.7 K/uL - - 3.9  LYMPHSABS 0.7 - 4.0 K/uL - -  1.7     Body mass index is 33.38 kg/m.  Orders:  No orders of the defined types were placed in this encounter.  Meds ordered this encounter  Medications  . mupirocin ointment (BACTROBAN) 2 %    Sig: Apply 1 application topically 2 (two) times daily.    Dispense:  22 g    Refill:  6  . pentoxifylline (TRENTAL) 400 MG CR tablet    Sig: Take 1 tablet (400 mg total) by mouth 3 (three) times daily with meals.    Dispense:  90 tablet    Refill:  3     Procedures: No procedures performed  Clinical Data: No additional findings.  ROS:  All other systems negative, except as noted in the HPI. Review of Systems  Objective: Vital Signs: Ht 6' 2"  (1.88 m)   Wt 260 lb (117.9 kg)   BMI 33.38 kg/m   Specialty Comments:  No specialty comments  available.  PMFS History: Patient Active Problem List   Diagnosis Date Noted  . Wound infection   . Acute osteomyelitis of left foot ([place]) 04/11/2019  . Lactic acidosis 04/11/2019  . Sepsis due to Enterobacter ([place]) 04/11/2019  . Diabetic foot ulcer ([place]) 03/25/2019  . Idiopathic chronic gout of left foot without tophus   . Myelopathy ([place]) 07/16/2013  . Ingrown nail 07/05/2013  . Paronychia 07/05/2013  . Diverticulosis 04/22/2013  . Acute osteomyelitis of humerus ([place]) 02/07/2013  . Osteomyelitis of left foot ([place]) 02/01/2013  . Joint infection ([place]) 01/22/2013  . Pyogenic arthritis of shoulder region ([place]) 01/15/2013  . Nephrolithiasis 01/23/2012  . Lumbar radiculopathy 02/28/2011  . Acute gout of right knee 04/30/2010  . Chronic pain 02/14/2010  . NASH (nonalcoholic steatohepatitis) 11/08/2008  . Obesity 10/27/2008  . Hypertriglyceridemia 10/27/2007  . Uncontrolled type 2 diabetes mellitus with hyperglycemia, with long-term current use of insulin ([place]) 03/12/2006  . Essential hypertension, benign 03/12/2006   Past Medical History:  Diagnosis Date  . Arthritis   . Back pain   . Diabetes mellitus without complication ([place])   . Diabetic foot ulcer ([place]) 03/2019  . Hypertension     Family History  Problem Relation Age of Onset  . Heart failure Mother   . Cancer Father     Past Surgical History:  Procedure Laterality Date  . BACK SURGERY    . CARPAL TUNNEL RELEASE    . CHOLECYSTECTOMY    . FOOT SURGERY    . JOINT REPLACEMENT    . SHOULDER ARTHROSCOPY    . TOTAL KNEE ARTHROPLASTY    . TRANSMETATARSAL AMPUTATION Left 03/27/2019   Procedure: TRANSMETATARSAL AMPUTATION left foot;  Surgeon: [name], MD;  Location: [facility];  Service: Orthopedics;  Laterality: Left;   Social History   Occupational History  . Not on file  Tobacco Use  . Smoking status: Never Smoker  . Smokeless tobacco: Never Used  Substance and Sexual Activity  . Alcohol use: No  . Drug use:  No  . Sexual activity: Not on file

## 2019-05-20 ENCOUNTER — Ambulatory Visit: Payer: Medicaid Other | Admitting: Orthopedic Surgery

## 2019-05-24 ENCOUNTER — Encounter: Payer: Self-pay | Admitting: Physician Assistant

## 2019-05-24 ENCOUNTER — Other Ambulatory Visit: Payer: Self-pay

## 2019-05-24 ENCOUNTER — Ambulatory Visit (INDEPENDENT_AMBULATORY_CARE_PROVIDER_SITE_OTHER): Payer: Medicaid Other | Admitting: Physician Assistant

## 2019-05-24 ENCOUNTER — Other Ambulatory Visit: Payer: Self-pay | Admitting: Physician Assistant

## 2019-05-24 VITALS — Ht 74.0 in | Wt 260.0 lb

## 2019-05-24 DIAGNOSIS — T8130XA Disruption of wound, unspecified, initial encounter: Secondary | ICD-10-CM

## 2019-05-24 NOTE — Progress Notes (Signed)
Office Visit Note   Patient: [name]           Date of Birth: [birth year]           MRN: [id number] Visit Date: 05/24/2019              Requested by: [name] [facility] [place],  [place] [address] PCP: [facility], [facility]  Chief Complaint  Patient presents with  . Left Foot - Routine Post Op    03/27/19 left transmet amputation       HPI: This is a pleasant 64 year old gentleman who is now 2 months status post left transmetatarsal amputation.  He has had difficulty healing the central portion of the wound associated with his some swelling.  He has been using some nitroglycerin patches to help.  He has been having dressing changes with home health  Assessment & Plan: Visit Diagnoses: No diagnosis found.  Plan: Patient was seen today by [name].  Considering the amount of hypergranulation tissue that is not very vascular in the central portion of the wound we recommended revision.  Discussed the recovery risks involved we will go forward with this in a couple days  Follow-Up Instructions: No follow-ups on file.   Ortho Exam  Patient is alert, oriented, no adenopathy, well-dressed, normal affect, normal respiratory effort. Focused examination periphery of the incision is healed well.  As you go towards the central portion there is an area of 3 x 4 cm with hyper granulation avascular tissue in the central portion.  There is no foul odor or no significant drainage there is some surrounding skin maceration pulses are palpable.  Heart regular rate and rhythm lungs clear bilateral Imaging: No results found. No images are attached to the encounter.  Labs: Lab Results  Component Value Date   HGBA1C 7.2 (H) 03/25/2019   HGBA1C 7.1 (H) 03/28/2017   ESRSEDRATE 81 (H) 03/25/2019   CRP 1.4 (H) 04/11/2019   CRP 1.9 (H) 03/25/2019   LABURIC 5.6 04/13/2019   REPTSTATUS 04/12/2019 FINAL 04/11/2019   GRAMSTAIN  03/27/2019    RARE WBC  PRESENT,BOTH PMN AND MONONUCLEAR NO ORGANISMS SEEN    CULT  04/11/2019    NO GROWTH Performed at [facility], [place] [address] [place] [address]    LABORGA ENTEROBACTER AEROGENES 03/27/2019     Lab Results  Component Value Date   ALBUMIN 3.0 (L) 04/12/2019   ALBUMIN 3.5 04/11/2019   ALBUMIN 3.7 03/25/2019   PREALBUMIN 17.6 (L) 03/25/2019   LABURIC 5.6 04/13/2019    No results found for: MG No results found for: VD25OH  Lab Results  Component Value Date   PREALBUMIN 17.6 (L) 03/25/2019   CBC EXTENDED Latest Ref Rng & Units 04/15/2019 04/13/2019 04/12/2019  WBC 4.0 - 10.5 K/uL 5.2 5.3 6.2  RBC 4.22 - 5.81 MIL/uL 2.87(L) 2.81(L) 2.87(L)  HGB 13.0 - 17.0 g/dL 8.8(L) 8.6(L) 8.8(L)  HCT 39.0 - 52.0 % 27.5(L) 26.7(L) 27.5(L)  PLT 150 - 400 K/uL 132(L) 129(L) 123(L)  NEUTROABS 1.7 - 7.7 K/uL - - 3.9  LYMPHSABS 0.7 - 4.0 K/uL - - 1.7     Body mass index is 33.38 kg/m.  Orders:  No orders of the defined types were placed in this encounter.  No orders of the defined types were placed in this encounter.    Procedures: No procedures performed  Clinical Data: No additional findings.  ROS:  All other systems negative, except as  noted in the HPI. Review of Systems  Objective: Vital Signs: Ht 6' 2"  (1.88 m)   Wt 260 lb (117.9 kg)   BMI 33.38 kg/m   Specialty Comments:  No specialty comments available.  PMFS History: Patient Active Problem List   Diagnosis Date Noted  . Wound infection   . Acute osteomyelitis of left foot ([place]) 04/11/2019  . Lactic acidosis 04/11/2019  . Sepsis due to Enterobacter ([place]) 04/11/2019  . Diabetic foot ulcer ([place]) 03/25/2019  . Idiopathic chronic gout of left foot without tophus   . Myelopathy ([place]) 07/16/2013  . Ingrown nail 07/05/2013  . Paronychia 07/05/2013  . Diverticulosis 04/22/2013  . Acute osteomyelitis of humerus ([place]) 02/07/2013  . Osteomyelitis of left foot ([place]) 02/01/2013  . Joint infection ([place])  01/22/2013  . Pyogenic arthritis of shoulder region ([place]) 01/15/2013  . Nephrolithiasis 01/23/2012  . Lumbar radiculopathy 02/28/2011  . Acute gout of right knee 04/30/2010  . Chronic pain 02/14/2010  . NASH (nonalcoholic steatohepatitis) 11/08/2008  . Obesity 10/27/2008  . Hypertriglyceridemia 10/27/2007  . Uncontrolled type 2 diabetes mellitus with hyperglycemia, with long-term current use of insulin ([place]) 03/12/2006  . Essential hypertension, benign 03/12/2006   Past Medical History:  Diagnosis Date  . Arthritis   . Back pain   . Diabetes mellitus without complication ([place])   . Diabetic foot ulcer ([place]) 03/2019  . Hypertension     Family History  Problem Relation Age of Onset  . Heart failure Mother   . Cancer Father     Past Surgical History:  Procedure Laterality Date  . BACK SURGERY    . CARPAL TUNNEL RELEASE    . CHOLECYSTECTOMY    . FOOT SURGERY    . JOINT REPLACEMENT    . SHOULDER ARTHROSCOPY    . TOTAL KNEE ARTHROPLASTY    . TRANSMETATARSAL AMPUTATION Left 03/27/2019   Procedure: TRANSMETATARSAL AMPUTATION left foot;  Surgeon: [name], MD;  Location: [facility];  Service: Orthopedics;  Laterality: Left;   Social History   Occupational History  . Not on file  Tobacco Use  . Smoking status: Never Smoker  . Smokeless tobacco: Never Used  Substance and Sexual Activity  . Alcohol use: No  . Drug use: No  . Sexual activity: Not on file

## 2019-05-25 ENCOUNTER — Other Ambulatory Visit: Payer: Self-pay

## 2019-05-25 ENCOUNTER — Encounter (HOSPITAL_COMMUNITY): Payer: Self-pay | Admitting: Orthopedic Surgery

## 2019-05-25 NOTE — Progress Notes (Signed)
Spoke with pt for pre-op call. Pt denies cardiac history. Pt is a type 2 diabetic. Last A1C was 7.2 on 03/25/19. He states his fasting blood sugar is usually around 100. Today it was 79 per pt. Instructed pt to take 1/2 of his regular dose of Levemir this evening and in the AM. Instructed pt to check his blood sugar when he gets up in the AM and every 2 hour until he leaves for the hospital. If blood sugar is 70 or below, treat with 1/2 cup of clear juice (apple or cranberry) and recheck blood sugar 15 minutes after drinking juice. If blood sugar continues to be 70 or below, call the Short Stay department and ask to speak to a nurse. Pt voiced understanding.  Pt did not have transportation to get Covid test done. Instructed him to arrive at 9:20 AM to be able to get the test done prior to surgery. He states he will be here at 7 AM because he is arriving via [employer].   Pt did not want to call the pharmacy because he states nothing has changed with his medications since he was discharged from the hospital.

## 2019-05-26 ENCOUNTER — Ambulatory Visit (HOSPITAL_COMMUNITY): Payer: Medicaid Other | Admitting: Anesthesiology

## 2019-05-26 ENCOUNTER — Encounter (HOSPITAL_COMMUNITY): Payer: Self-pay | Admitting: Orthopedic Surgery

## 2019-05-26 ENCOUNTER — Inpatient Hospital Stay (HOSPITAL_COMMUNITY)
Admission: RE | Admit: 2019-05-26 | Discharge: 2019-05-28 | DRG: 505 | Disposition: A | Discharge status: Home / Self Care | Payer: Medicaid Other | Attending: Orthopedic Surgery | Admitting: Orthopedic Surgery

## 2019-05-26 ENCOUNTER — Other Ambulatory Visit: Payer: Self-pay

## 2019-05-26 ENCOUNTER — Encounter (HOSPITAL_COMMUNITY)
Admission: RE | Disposition: A | Discharge status: Home / Self Care | Payer: Self-pay | Source: Home / Self Care | Attending: Orthopedic Surgery

## 2019-05-26 DIAGNOSIS — Z8249 Family history of ischemic heart disease and other diseases of the circulatory system: Secondary | ICD-10-CM

## 2019-05-26 DIAGNOSIS — Z6830 Body mass index (BMI) 30.0-30.9, adult: Secondary | ICD-10-CM

## 2019-05-26 DIAGNOSIS — Z96659 Presence of unspecified artificial knee joint: Secondary | ICD-10-CM | POA: Diagnosis present

## 2019-05-26 DIAGNOSIS — I872 Venous insufficiency (chronic) (peripheral): Secondary | ICD-10-CM | POA: Diagnosis present

## 2019-05-26 DIAGNOSIS — E669 Obesity, unspecified: Secondary | ICD-10-CM | POA: Diagnosis present

## 2019-05-26 DIAGNOSIS — K7581 Nonalcoholic steatohepatitis (NASH): Secondary | ICD-10-CM | POA: Diagnosis present

## 2019-05-26 DIAGNOSIS — Z888 Allergy status to other drugs, medicaments and biological substances status: Secondary | ICD-10-CM

## 2019-05-26 DIAGNOSIS — E1151 Type 2 diabetes mellitus with diabetic peripheral angiopathy without gangrene: Secondary | ICD-10-CM | POA: Diagnosis present

## 2019-05-26 DIAGNOSIS — T8781 Dehiscence of amputation stump: Principal | ICD-10-CM

## 2019-05-26 DIAGNOSIS — Z20822 Contact with and (suspected) exposure to covid-19: Secondary | ICD-10-CM | POA: Diagnosis present

## 2019-05-26 DIAGNOSIS — Z809 Family history of malignant neoplasm, unspecified: Secondary | ICD-10-CM

## 2019-05-26 DIAGNOSIS — T8130XA Disruption of wound, unspecified, initial encounter: Secondary | ICD-10-CM | POA: Diagnosis present

## 2019-05-26 DIAGNOSIS — I10 Essential (primary) hypertension: Secondary | ICD-10-CM | POA: Diagnosis present

## 2019-05-26 DIAGNOSIS — Y835 Amputation of limb(s) as the cause of abnormal reaction of the patient, or of later complication, without mention of misadventure at the time of the procedure: Secondary | ICD-10-CM | POA: Diagnosis present

## 2019-05-26 DIAGNOSIS — Z87442 Personal history of urinary calculi: Secondary | ICD-10-CM

## 2019-05-26 DIAGNOSIS — Z794 Long term (current) use of insulin: Secondary | ICD-10-CM

## 2019-05-26 HISTORY — PX: STUMP REVISION: SHX6102

## 2019-05-26 HISTORY — DX: Personal history of urinary calculi: Z87.442

## 2019-05-26 HISTORY — PX: OTHER SURGICAL HISTORY: SHX169

## 2019-05-26 LAB — BASIC METABOLIC PANEL
Anion gap: 11 (ref 5–15)
BUN: 12 mg/dL (ref 8–23)
CO2: 26 mmol/L (ref 22–32)
Calcium: 9.7 mg/dL (ref 8.9–10.3)
Chloride: 103 mmol/L (ref 98–111)
Creatinine, Ser: 1.01 mg/dL (ref 0.61–1.24)
GFR calc Af Amer: >60 mL/min (ref >60)
GFR calc non Af Amer: >60 mL/min (ref >60)
Glucose, Bld: 113 mg/dL — ABNORMAL HIGH (ref 70–99)
Potassium: 4.8 mmol/L (ref 3.5–5.1)
Sodium: 140 mmol/L (ref 135–145)

## 2019-05-26 LAB — GLUCOSE, CAPILLARY
Glucose-Capillary: 110 mg/dL — ABNORMAL HIGH (ref 70–99)
Glucose-Capillary: 103 mg/dL — ABNORMAL HIGH (ref 70–99)
Glucose-Capillary: 72 mg/dL (ref 70–99)
Glucose-Capillary: 83 mg/dL (ref 70–99)
Glucose-Capillary: 211 mg/dL — ABNORMAL HIGH (ref 70–99)
Glucose-Capillary: 138 mg/dL — ABNORMAL HIGH (ref 70–99)

## 2019-05-26 LAB — SARS CORONAVIRUS 2 BY RT PCR (HOSPITAL ORDER, PERFORMED IN ~~LOC~~ HOSPITAL LAB): SARS Coronavirus 2: NEGATIVE

## 2019-05-26 LAB — CBC
HCT: 32.2 % — ABNORMAL LOW (ref 39.0–52.0)
Hemoglobin: 10.3 g/dL — ABNORMAL LOW (ref 13.0–17.0)
MCH: 28.5 pg (ref 26.0–34.0)
MCHC: 32 g/dL (ref 30.0–36.0)
MCV: 89 fL (ref 80.0–100.0)
Platelets: 206 10*3/uL (ref 150–400)
RBC: 3.62 MIL/uL — ABNORMAL LOW (ref 4.22–5.81)
RDW: 14.6 % (ref 11.5–15.5)
WBC: 5.2 10*3/uL (ref 4.0–10.5)
nRBC: 0 % (ref 0.0–0.2)

## 2019-05-26 LAB — HEMOGLOBIN A1C
Hgb A1c MFr Bld: 6 % — ABNORMAL HIGH (ref 4.8–5.6)
Mean Plasma Glucose: 125.5 mg/dL

## 2019-05-26 SURGERY — REVISION, AMPUTATION SITE
Anesthesia: General | Site: Foot | Laterality: Left

## 2019-05-26 MED ORDER — MIDAZOLAM HCL 2 MG/2ML IJ SOLN
INTRAMUSCULAR | Status: AC
  Filled 2019-05-26: qty 2

## 2019-05-26 MED ORDER — INSULIN DETEMIR 100 UNIT/ML ~~LOC~~ SOLN
45.0000 [IU] | Freq: Two times a day (BID) | SUBCUTANEOUS | Status: DC
  Administered 2019-05-26 – 2019-05-28 (×4): 45 [IU] via SUBCUTANEOUS
  Filled 2019-05-26 (×5): qty 0.45

## 2019-05-26 MED ORDER — DOCUSATE SODIUM 100 MG PO CAPS
100.0000 mg | ORAL_CAPSULE | Freq: Two times a day (BID) | ORAL | Status: DC
  Administered 2019-05-26 – 2019-05-27 (×2): 100 mg via ORAL
  Filled 2019-05-26 (×4): qty 1

## 2019-05-26 MED ORDER — HYDROMORPHONE HCL 1 MG/ML IJ SOLN
0.5000 mg | INTRAMUSCULAR | Status: DC | PRN
  Administered 2019-05-26 – 2019-05-28 (×6): 0.5 mg via INTRAVENOUS
  Filled 2019-05-26 (×6): qty 1

## 2019-05-26 MED ORDER — LISINOPRIL 20 MG PO TABS
40.0000 mg | ORAL_TABLET | Freq: Every day | ORAL | Status: DC
  Administered 2019-05-26 – 2019-05-28 (×3): 40 mg via ORAL
  Filled 2019-05-26 (×3): qty 2

## 2019-05-26 MED ORDER — ASPIRIN EC 81 MG PO TBEC
81.0000 mg | DELAYED_RELEASE_TABLET | ORAL | Status: DC
  Administered 2019-05-27: 81 mg via ORAL
  Filled 2019-05-26 (×2): qty 1

## 2019-05-26 MED ORDER — METHOCARBAMOL 500 MG PO TABS
500.0000 mg | ORAL_TABLET | Freq: Four times a day (QID) | ORAL | Status: DC | PRN
  Administered 2019-05-26: 500 mg via ORAL
  Filled 2019-05-26 (×2): qty 1

## 2019-05-26 MED ORDER — METOPROLOL SUCCINATE ER 50 MG PO TB24
100.0000 mg | ORAL_TABLET | Freq: Every day | ORAL | Status: DC
  Administered 2019-05-27 – 2019-05-28 (×2): 100 mg via ORAL
  Filled 2019-05-26 (×2): qty 2

## 2019-05-26 MED ORDER — LIDOCAINE 2% (20 MG/ML) 5 ML SYRINGE
INTRAMUSCULAR | Status: AC
  Filled 2019-05-26: qty 5

## 2019-05-26 MED ORDER — OXYCODONE HCL 5 MG PO TABS: 5.0000 mg | ORAL_TABLET | Freq: Once | ORAL | Status: DC | PRN

## 2019-05-26 MED ORDER — FENTANYL CITRATE (PF) 100 MCG/2ML IJ SOLN
INTRAMUSCULAR | Status: AC
  Filled 2019-05-26: qty 2

## 2019-05-26 MED ORDER — METOCLOPRAMIDE HCL 5 MG/ML IJ SOLN: 5.0000 mg | Freq: Three times a day (TID) | INTRAMUSCULAR | Status: DC | PRN

## 2019-05-26 MED ORDER — OXYCODONE HCL 5 MG/5ML PO SOLN: 5.0000 mg | Freq: Once | ORAL | Status: DC | PRN

## 2019-05-26 MED ORDER — ONDANSETRON HCL 4 MG/2ML IJ SOLN
INTRAMUSCULAR | Status: AC
  Filled 2019-05-26: qty 2

## 2019-05-26 MED ORDER — OXYCODONE HCL 5 MG PO TABS
5.0000 mg | ORAL_TABLET | ORAL | Status: DC | PRN
  Administered 2019-05-27 – 2019-05-28 (×5): 10 mg via ORAL
  Filled 2019-05-26 (×6): qty 2

## 2019-05-26 MED ORDER — FENTANYL CITRATE (PF) 100 MCG/2ML IJ SOLN
INTRAMUSCULAR | Status: DC | PRN
  Administered 2019-05-26 (×3): 25 ug via INTRAVENOUS

## 2019-05-26 MED ORDER — LACTATED RINGERS IV SOLN: INTRAVENOUS | Status: DC

## 2019-05-26 MED ORDER — METHOCARBAMOL 1000 MG/10ML IJ SOLN
500.0000 mg | Freq: Four times a day (QID) | INTRAVENOUS | Status: DC | PRN
  Filled 2019-05-26: qty 5

## 2019-05-26 MED ORDER — CEFAZOLIN SODIUM-DEXTROSE 2-4 GM/100ML-% IV SOLN
2.0000 g | INTRAVENOUS | Status: AC
  Administered 2019-05-26: 2 g via INTRAVENOUS
  Filled 2019-05-26: qty 100

## 2019-05-26 MED ORDER — 0.9 % SODIUM CHLORIDE (POUR BTL) OPTIME
TOPICAL | Status: DC | PRN
  Administered 2019-05-26: 1000 mL

## 2019-05-26 MED ORDER — AMLODIPINE BESYLATE 5 MG PO TABS
5.0000 mg | ORAL_TABLET | Freq: Every day | ORAL | Status: DC
  Administered 2019-05-27 – 2019-05-28 (×2): 5 mg via ORAL
  Filled 2019-05-26 (×2): qty 1

## 2019-05-26 MED ORDER — AMITRIPTYLINE HCL 25 MG PO TABS
25.0000 mg | ORAL_TABLET | Freq: Every day | ORAL | Status: DC
  Administered 2019-05-26 – 2019-05-27 (×2): 25 mg via ORAL
  Filled 2019-05-26 (×2): qty 1

## 2019-05-26 MED ORDER — GABAPENTIN 300 MG PO CAPS
300.0000 mg | ORAL_CAPSULE | Freq: Three times a day (TID) | ORAL | Status: DC
  Administered 2019-05-26 – 2019-05-28 (×5): 300 mg via ORAL
  Filled 2019-05-26 (×5): qty 1

## 2019-05-26 MED ORDER — PROMETHAZINE HCL 25 MG/ML IJ SOLN: 6.2500 mg | INTRAMUSCULAR | Status: DC | PRN

## 2019-05-26 MED ORDER — INSULIN ASPART 100 UNIT/ML ~~LOC~~ SOLN
0.0000 [IU] | Freq: Three times a day (TID) | SUBCUTANEOUS | Status: DC
  Administered 2019-05-27: 2 [IU] via SUBCUTANEOUS
  Administered 2019-05-27: 3 [IU] via SUBCUTANEOUS
  Administered 2019-05-27: 2 [IU] via SUBCUTANEOUS
  Administered 2019-05-28: 3 [IU] via SUBCUTANEOUS

## 2019-05-26 MED ORDER — PROPOFOL 10 MG/ML IV BOLUS
INTRAVENOUS | Status: AC
  Filled 2019-05-26: qty 20

## 2019-05-26 MED ORDER — ONDANSETRON HCL 4 MG PO TABS: 4.0000 mg | ORAL_TABLET | Freq: Four times a day (QID) | ORAL | Status: DC | PRN

## 2019-05-26 MED ORDER — FENTANYL CITRATE (PF) 100 MCG/2ML IJ SOLN
25.0000 ug | INTRAMUSCULAR | Status: DC | PRN
  Administered 2019-05-26 (×2): 50 ug via INTRAVENOUS

## 2019-05-26 MED ORDER — ONDANSETRON HCL 4 MG/2ML IJ SOLN: 4.0000 mg | Freq: Four times a day (QID) | INTRAMUSCULAR | Status: DC | PRN

## 2019-05-26 MED ORDER — FUROSEMIDE 20 MG PO TABS
20.0000 mg | ORAL_TABLET | Freq: Every day | ORAL | Status: DC
  Administered 2019-05-26 – 2019-05-27 (×2): 20 mg via ORAL
  Filled 2019-05-26 (×3): qty 1

## 2019-05-26 MED ORDER — ACETAMINOPHEN 325 MG PO TABS: 325.0000 mg | ORAL_TABLET | Freq: Four times a day (QID) | ORAL | Status: DC | PRN

## 2019-05-26 MED ORDER — LIDOCAINE 2% (20 MG/ML) 5 ML SYRINGE
INTRAMUSCULAR | Status: DC | PRN
  Administered 2019-05-26: 60 mg via INTRAVENOUS

## 2019-05-26 MED ORDER — PHENYLEPHRINE 40 MCG/ML (10ML) SYRINGE FOR IV PUSH (FOR BLOOD PRESSURE SUPPORT)
PREFILLED_SYRINGE | INTRAVENOUS | Status: DC | PRN
  Administered 2019-05-26: 120 ug via INTRAVENOUS
  Administered 2019-05-26: 80 ug via INTRAVENOUS

## 2019-05-26 MED ORDER — CEFAZOLIN SODIUM-DEXTROSE 1-4 GM/50ML-% IV SOLN
1.0000 g | Freq: Four times a day (QID) | INTRAVENOUS | Status: AC
  Administered 2019-05-26 – 2019-05-27 (×3): 1 g via INTRAVENOUS
  Filled 2019-05-26 (×3): qty 50

## 2019-05-26 MED ORDER — FENTANYL CITRATE (PF) 250 MCG/5ML IJ SOLN
INTRAMUSCULAR | Status: AC
  Filled 2019-05-26: qty 5

## 2019-05-26 MED ORDER — ONDANSETRON HCL 4 MG/2ML IJ SOLN
INTRAMUSCULAR | Status: DC | PRN
  Administered 2019-05-26: 4 mg via INTRAVENOUS

## 2019-05-26 MED ORDER — ALLOPURINOL 300 MG PO TABS
300.0000 mg | ORAL_TABLET | Freq: Every day | ORAL | Status: DC
  Administered 2019-05-27 – 2019-05-28 (×2): 300 mg via ORAL
  Filled 2019-05-26 (×2): qty 1

## 2019-05-26 MED ORDER — METOCLOPRAMIDE HCL 5 MG PO TABS: 5.0000 mg | ORAL_TABLET | Freq: Three times a day (TID) | ORAL | Status: DC | PRN

## 2019-05-26 MED ORDER — EPHEDRINE SULFATE-NACL 50-0.9 MG/10ML-% IV SOSY
PREFILLED_SYRINGE | INTRAVENOUS | Status: DC | PRN
  Administered 2019-05-26: 20 mg via INTRAVENOUS

## 2019-05-26 MED ORDER — PROPOFOL 10 MG/ML IV BOLUS
INTRAVENOUS | Status: DC | PRN
  Administered 2019-05-26: 160 mg via INTRAVENOUS

## 2019-05-26 MED ORDER — INSULIN ASPART 100 UNIT/ML ~~LOC~~ SOLN
3.0000 [IU] | Freq: Three times a day (TID) | SUBCUTANEOUS | Status: DC
  Administered 2019-05-27 – 2019-05-28 (×4): 3 [IU] via SUBCUTANEOUS

## 2019-05-26 MED ORDER — SODIUM CHLORIDE 0.9 % IV SOLN: INTRAVENOUS | Status: DC

## 2019-05-26 SURGICAL SUPPLY — 39 items
BLADE SAW RECIP 87.9 MT (BLADE) ×3 IMPLANT
BLADE SURG 21 STRL SS (BLADE) ×3 IMPLANT
BNDG COHESIVE 6X5 TAN NS LF (GAUZE/BANDAGES/DRESSINGS) ×3 IMPLANT
BNDG COHESIVE 6X5 TAN STRL LF (GAUZE/BANDAGES/DRESSINGS) ×3 IMPLANT
BNDG GAUZE ELAST 4 BULKY (GAUZE/BANDAGES/DRESSINGS) ×3 IMPLANT
CANISTER WOUND CARE 500ML ATS (WOUND CARE) ×3 IMPLANT
CANISTER WOUNDNEG PRESSURE 500 (CANNISTER) IMPLANT
COVER SURGICAL LIGHT HANDLE (MISCELLANEOUS) ×3 IMPLANT
COVER WAND RF STERILE (DRAPES) ×3 IMPLANT
DRAPE EXTREMITY T 121X128X90 (DISPOSABLE) ×3 IMPLANT
DRAPE HALF SHEET 40X57 (DRAPES) ×3 IMPLANT
DRAPE INCISE IOBAN 66X45 STRL (DRAPES) ×3 IMPLANT
DRAPE U-SHAPE 47X51 STRL (DRAPES) ×6 IMPLANT
DRESSING PEEL AND PLC PRVNA 13 (GAUZE/BANDAGES/DRESSINGS) ×1 IMPLANT
DRESSING PREVENA PLUS CUSTOM (GAUZE/BANDAGES/DRESSINGS) IMPLANT
DRSG PEEL AND PLACE PREVENA 13 (GAUZE/BANDAGES/DRESSINGS) ×3
DRSG PREVENA PLUS CUSTOM (GAUZE/BANDAGES/DRESSINGS)
DURAPREP 26ML APPLICATOR (WOUND CARE) ×3 IMPLANT
ELECT REM PT RETURN 9FT ADLT (ELECTROSURGICAL) ×3
ELECTRODE REM PT RTRN 9FT ADLT (ELECTROSURGICAL) ×1 IMPLANT
GLOVE BIOGEL PI IND STRL 9 (GLOVE) ×1 IMPLANT
GLOVE BIOGEL PI INDICATOR 9 (GLOVE) ×2
GLOVE SURG ORTHO 9.0 STRL STRW (GLOVE) ×3 IMPLANT
GOWN STRL REUS W/ TWL XL LVL3 (GOWN DISPOSABLE) ×2 IMPLANT
GOWN STRL REUS W/TWL XL LVL3 (GOWN DISPOSABLE) ×4
KIT BASIN OR (CUSTOM PROCEDURE TRAY) ×3 IMPLANT
KIT TURNOVER KIT B (KITS) ×3 IMPLANT
MANIFOLD NEPTUNE II (INSTRUMENTS) ×3 IMPLANT
NS IRRIG 1000ML POUR BTL (IV SOLUTION) ×3 IMPLANT
PACK GENERAL/GYN (CUSTOM PROCEDURE TRAY) ×3 IMPLANT
PAD ARMBOARD 7.5X6 YLW CONV (MISCELLANEOUS) ×3 IMPLANT
STAPLER VISISTAT 35W (STAPLE) IMPLANT
SUT ETHILON 2 0 PSLX (SUTURE) ×6 IMPLANT
SUT SILK 2 0 (SUTURE)
SUT SILK 2-0 18XBRD TIE 12 (SUTURE) IMPLANT
TOWEL GREEN STERILE (TOWEL DISPOSABLE) ×3 IMPLANT
TUBE CONNECTING 12'X1/4 (SUCTIONS) ×1
TUBE CONNECTING 12X1/4 (SUCTIONS) ×2 IMPLANT
YANKAUER SUCT BULB TIP NO VENT (SUCTIONS) ×3 IMPLANT

## 2019-05-26 NOTE — H&P (Signed)
[name] is an 64 y.o. male.   Chief Complaint: Left Foot Wound dehiscence HPI: This is a pleasant 64 year old gentleman who is now 2 months status post left transmetatarsal amputation.  He has had difficulty healing the central portion of the wound associated with his some swelling.  He has been using some nitroglycerin patches to help.  He has been having dressing changes with home health             Office Visit 05/24/2019  [place]   Dalya Maselli, Bevely Palmer, [place] [facility] Wound dehiscence Dx Left Foot - Routine Post Op ; Referred by [facility], [facility] Reason for Visit     Additional Documentation Vitals:  Ht 6' 2"  (1.88 m)  Wt 117.9 kg  BMI 33.38 kg/m  BSA 2.48 m  Flowsheets:  Anthropometrics,  Method of Visit    Encounter Info:  Billing Info,  History,  Allergies,  Detailed Report          All Notes Progress Notes by Ayaat Jansma, Bevely Palmer, PA at 05/24/2019 2:00 PM Author: Betti Goodenow, Bevely Palmer, [place] Author Type: Physician Assistant Filed: 05/24/2019 2:14 PM  Note Status: Signed Cosign: Cosign Not Required Encounter Date: 05/24/2019  Editor: Cherrell Maybee, Bevely Palmer, PA (Physician Assistant)   Expand AllCollapse All    Office Visit Note  Patient: [name]  Date of Birth: 1955-08-15  MRN: [id number]  Visit Date: 05/24/2019  Requested by: [name] [facility]  [place], [place] [address]  PCP: [facility], [facility]       Chief Complaint  Patient presents with  . Left Foot - Routine Post Op    03/27/19 left transmet amputation    HPI:  This is a pleasant 64 year old gentleman who is now 2 months status post left transmetatarsal amputation. He has had difficulty healing the central portion of the wound associated with his some swelling. He has been using some nitroglycerin patches to help. He has been having dressing changes with home health  Assessment & Plan:  Visit Diagnoses:  No diagnosis found.  Plan: Patient was seen today by [name]. Considering the amount of hypergranulation tissue that is not very vascular in the central portion of the wound we recommended revision. Discussed the recovery risks involved we will go forward with this in a couple days  Follow-Up Instructions: No follow-ups on file.  Ortho Exam  Patient is alert, oriented, no adenopathy, well-dressed, normal affect, normal respiratory effort.  Focused examination periphery of the incision is healed well. As you go towards the central portion there is an area of 3 x 4 cm with hyper granulation avascular tissue in the central portion. There is no foul odor or no significant drainage there is some surrounding skin maceration pulses are palpable. Heart regular rate and rhythm lungs clear bilateral  Imaging:  No results found.  No images are attached to the encounter.  Labs:    Recent Labs  Instructions  Return surgery. After Visit Summary (Automatic SnapShot taken 05/24/2019)         Communications  [facility] Provider CC Chart Rep sent to [place] signature on 05/24/2019 1:19 PM - E-signed     Communication Routing History Recipient Method Sent by Date [facility] Fax Amnah Breuer, [place], [place] 05/24/2019  Fax: [phone]  Phone: [phone]       No questionnaires available.           Orders Placed None   Medication Changes None   Medication List        Visit Diagnoses  Wound dehiscence   Problem List           Level of Service Level of Service  PR POST-OP FOLLOW-UP VISIT [78676]     All Charges for This Encounter Code  [phone]  Description: PR POST-OP FOLLOW-UP VISIT  Service Date: 05/24/2019  Service Provider: Reymond Maynez, Bevely Palmer, PA  Qty: 1         Past Medical History:  Diagnosis Date  . Arthritis   . Back pain   . Diabetes mellitus without complication ([place])   . Diabetic foot ulcer ([place]) 03/2019  . History of kidney stones   . Hypertension     Past Surgical History:  Procedure Laterality Date  . BACK SURGERY    . CARPAL TUNNEL RELEASE    . CHOLECYSTECTOMY    . FOOT SURGERY    . JOINT REPLACEMENT    . SHOULDER ARTHROSCOPY    . TOTAL KNEE ARTHROPLASTY    . TRANSMETATARSAL AMPUTATION Left 03/27/2019   Procedure: TRANSMETATARSAL AMPUTATION left foot;  Surgeon: [name], MD;  Location: [facility];  Service: Orthopedics;  Laterality: Left;    Family History  Problem Relation Age of Onset  . Heart failure Mother   . Cancer Father    Social History:  reports that he has never smoked. He has never used smokeless tobacco. He reports that he does not drink alcohol or use drugs.  Allergies:  Allergies  Allergen Reactions  . Other Hives    Anti-snake venim for copperhead venom.  . Pravastatin Other (See Comments)    Leg cramps, elevated CK  . Prednisone Other (See Comments)    Patient states "it runs my blood sugar up so I don't take it."  . Toradol [Ketorolac Tromethamine] Hives and Nausea And Vomiting    Pill form only    No medications prior to admission.    No results found for this or any previous visit (from the past 48 hour(s)). No results found.  Review of Systems  All other systems reviewed and are negative.   There were no vitals taken for this visit. Physical Exam  Patient is alert, oriented, no adenopathy, well-dressed, normal affect, normal respiratory effort. Focused examination periphery of the incision is healed well.  As you go towards the central portion there is an area of 3 x 4 cm with hyper granulation avascular tissue in the central portion.  There is no foul odor or no significant drainage there is some surrounding skin maceration pulses are palpable.  Heart regular rate and rhythm  lungs clear bilateral Assessment/Plan Plan: Patient was seen today by [name].  Considering the amount of hypergranulation tissue that is not very vascular in the central portion of the wound we recommended revision.  Discussed the recovery risks involved we will go forward with this in a  couple days   Bevely Palmer Pranathi Winfree, PA 05/26/2019, 6:39 AM

## 2019-05-26 NOTE — Transfer of Care (Signed)
Immediate Anesthesia Transfer of Care Note  Patient: [name]  Procedure(s) Performed: REVISION LEFT TRANSMETATARSAL AMPUTATION (Left Foot)  Patient Location: PACU  Anesthesia Type:General  Level of Consciousness: awake, alert  and oriented  Airway & Oxygen Therapy: Patient Spontanous Breathing and Patient connected to nasal cannula oxygen  Post-op Assessment: Report given to RN, Post -op Vital signs reviewed and stable and Patient moving all extremities  Post vital signs: Reviewed and stable  Last Vitals:  Vitals Value Taken Time  BP 113/67 05/26/19 1403  Temp    Pulse 73 05/26/19 1405  Resp 18 05/26/19 1405  SpO2 100 % 05/26/19 1405  Vitals shown include unvalidated device data.  Last Pain:  Vitals:   05/26/19 0901  TempSrc: Oral         Complications: No apparent anesthesia complications

## 2019-05-26 NOTE — Progress Notes (Signed)
Received pt from PACU accompanied by staff, alert/oriented in no apparent distress. Orientated to room/equipments. Welcome pack/guide/menu provided with explaination.No complaints voiced. Pt has been informed that facility is not responsible for any losses/damages to any personal/valuables and has the options to hand it to security for accounting purposes,. But pt declined.3 side rails up, call bell/room phone within reach and all wheels locked.

## 2019-05-26 NOTE — Anesthesia Procedure Notes (Signed)
Procedure Name: LMA Insertion Date/Time: 05/26/2019 1:32 PM Performed by: Kyair Ditommaso T, CRNA Pre-anesthesia Checklist: Patient identified, Emergency Drugs available, Suction available and Patient being monitored Patient Re-evaluated:Patient Re-evaluated prior to induction Oxygen Delivery Method: Circle system utilized Preoxygenation: Pre-oxygenation with 100% oxygen Induction Type: IV induction Ventilation: Mask ventilation without difficulty LMA: LMA inserted LMA Size: 5.0 Number of attempts: 1 Placement Confirmation: positive ETCO2 and breath sounds checked- equal and bilateral Tube secured with: Tape Dental Injury: Teeth and Oropharynx as per pre-operative assessment

## 2019-05-26 NOTE — Op Note (Signed)
05/26/2019  2:10 PM  PATIENT:  [name] DIAGNOSIS:  Dehiscence Left Transmetatarsal Amputation  POST-OPERATIVE DIAGNOSIS:  Same  PROCEDURE:  REVISION LEFT TRANSMETATARSAL AMPUTATION Application of Prevena wound VAC  SURGEON:  [name], MD  PHYSICIAN ASSISTANT:None ANESTHESIA:   General  PREOPERATIVE INDICATIONS:  [name] is a  64 y.o. male with a diagnosis of Dehiscence Left Transmetatarsal Amputation who failed conservative measures and elected for surgical management.    The risks benefits and alternatives were discussed with the patient preoperatively including but not limited to the risks of infection, bleeding, nerve injury, cardiopulmonary complications, the need for revision surgery, among others, and the patient was willing to proceed.  OPERATIVE IMPLANTS: Praveena wound VAC 13 cm  @ENCIMAGES @  OPERATIVE FINDINGS: Patient has dehiscence of the transmetatarsal amputation the wound margins were clear no deep abscess no deep osteomyelitis  OPERATIVE PROCEDURE: Patient was brought the operating room and underwent a general anesthetic.  After adequate levels anesthesia were obtained patient's left lower extremity was prepped using DuraPrep draped into a sterile field a timeout was called.  A fishmouth incision was made through healthy tissue to incorporate the wound dehiscence and necrotic tissue.  A reciprocating saw was used to perform a transmetatarsal amputation with resection approximately the distal 2 cm of the metatarsals.  The wound margins were clear no evidence of infection electrocautery was used for hemostasis the wound was irrigated with normal saline.  The wound was closed using 2-0 nylon a Prevena wound VAC was applied this had a good suction fit with patient's massive venous insufficiency the leg was overwrapped and Kerlix and a Coban wrap.  Patient was extubated taken to PACU in stable condition.   DISCHARGE PLANNING:  Antibiotic  duration: 24 hours  Weightbearing: Nonweightbearing on the left.  Pain medication: Opioid pathway  Dressing care/ Wound VAC: Continue wound VAC for 1 week  Ambulatory devices: Walker  Discharge to: Will admit for observation and discharge to home when patient is stable.  Patient does not have assistance at home.  Follow-up: In the office 1 week post operative.

## 2019-05-26 NOTE — Anesthesia Preprocedure Evaluation (Addendum)
Anesthesia Evaluation  Patient identified by MRN, date of birth, ID band Patient awake    Reviewed: Allergy & Precautions, NPO status , Patient's Chart, lab work & pertinent test results, reviewed documented beta blocker date and time   History of Anesthesia Complications Negative for: history of anesthetic complications  Airway Mallampati: II  TM Distance: >3 FB Neck ROM: Full    Dental  (+) Dental Advisory Given   Pulmonary neg pulmonary ROS,    Pulmonary exam normal        Cardiovascular hypertension, Pt. on home beta blockers and Pt. on medications Normal cardiovascular exam     Neuro/Psych negative neurological ROS  negative psych ROS   GI/Hepatic negative GI ROS,  NASH    Endo/Other  diabetes, Type 2, Insulin Dependent Obesity   Renal/GU negative Renal ROS     Musculoskeletal  (+) Arthritis ,   Abdominal   Peds  Hematology  (+) anemia ,   Anesthesia Other Findings Covid neg 5/12   Reproductive/Obstetrics                            Anesthesia Physical Anesthesia Plan  ASA: III  Anesthesia Plan: General   Post-op Pain Management:    Induction: Intravenous  PONV Risk Score and Plan: 2 and Treatment may vary due to age or medical condition, Ondansetron and Midazolam  Airway Management Planned: LMA  Additional Equipment: None  Intra-op Plan:   Post-operative Plan: Extubation in OR  Informed Consent: I have reviewed the patients History and Physical, chart, labs and discussed the procedure including the risks, benefits and alternatives for the proposed anesthesia with the patient or authorized representative who has indicated his/her understanding and acceptance.     Dental advisory given  Plan Discussed with: CRNA and Anesthesiologist  Anesthesia Plan Comments:        Anesthesia Quick Evaluation

## 2019-05-27 DIAGNOSIS — K7581 Nonalcoholic steatohepatitis (NASH): Secondary | ICD-10-CM | POA: Diagnosis present

## 2019-05-27 DIAGNOSIS — Z96659 Presence of unspecified artificial knee joint: Secondary | ICD-10-CM | POA: Diagnosis present

## 2019-05-27 DIAGNOSIS — I872 Venous insufficiency (chronic) (peripheral): Secondary | ICD-10-CM | POA: Diagnosis present

## 2019-05-27 DIAGNOSIS — T8781 Dehiscence of amputation stump: Secondary | ICD-10-CM | POA: Diagnosis present

## 2019-05-27 DIAGNOSIS — Z888 Allergy status to other drugs, medicaments and biological substances status: Secondary | ICD-10-CM | POA: Diagnosis not present

## 2019-05-27 DIAGNOSIS — E669 Obesity, unspecified: Secondary | ICD-10-CM | POA: Diagnosis present

## 2019-05-27 DIAGNOSIS — I10 Essential (primary) hypertension: Secondary | ICD-10-CM | POA: Diagnosis present

## 2019-05-27 DIAGNOSIS — Z20822 Contact with and (suspected) exposure to covid-19: Secondary | ICD-10-CM | POA: Diagnosis present

## 2019-05-27 DIAGNOSIS — Z8249 Family history of ischemic heart disease and other diseases of the circulatory system: Secondary | ICD-10-CM | POA: Diagnosis not present

## 2019-05-27 DIAGNOSIS — Y835 Amputation of limb(s) as the cause of abnormal reaction of the patient, or of later complication, without mention of misadventure at the time of the procedure: Secondary | ICD-10-CM | POA: Diagnosis present

## 2019-05-27 DIAGNOSIS — Z809 Family history of malignant neoplasm, unspecified: Secondary | ICD-10-CM | POA: Diagnosis not present

## 2019-05-27 DIAGNOSIS — E1151 Type 2 diabetes mellitus with diabetic peripheral angiopathy without gangrene: Secondary | ICD-10-CM | POA: Diagnosis present

## 2019-05-27 DIAGNOSIS — Z6830 Body mass index (BMI) 30.0-30.9, adult: Secondary | ICD-10-CM | POA: Diagnosis not present

## 2019-05-27 DIAGNOSIS — Z87442 Personal history of urinary calculi: Secondary | ICD-10-CM | POA: Diagnosis not present

## 2019-05-27 DIAGNOSIS — Z794 Long term (current) use of insulin: Secondary | ICD-10-CM | POA: Diagnosis not present

## 2019-05-27 LAB — GLUCOSE, CAPILLARY
Glucose-Capillary: 165 mg/dL — ABNORMAL HIGH (ref 70–99)
Glucose-Capillary: 129 mg/dL — ABNORMAL HIGH (ref 70–99)
Glucose-Capillary: 128 mg/dL — ABNORMAL HIGH (ref 70–99)
Glucose-Capillary: 241 mg/dL — ABNORMAL HIGH (ref 70–99)

## 2019-05-27 NOTE — Progress Notes (Signed)
Patient is postop day one status post transmetatarsal amputation.  He has not really been up and about in his room yet.  He does have some safety concerns about going home today  Vital signs stable.  He does have 75 cc output in his vac.  The vac has an adequate seal  Will change patient to inpatient.  Have some concerns about safety would like him to progress with physical therapy.  We will follow up tomorrow plan discharge then

## 2019-05-27 NOTE — Evaluation (Signed)
Occupational Therapy Evaluation Patient Details Name: [name] MRN: [id number] DOB: April 30, 1955 Today's Date: 05/27/2019    History of Present Illness 64 y.o. male admitted 05/26/19 s/p L foot wound dihiscence, 2 months s/p status post left transmetatarsal amputation. PMH HTN, kidney stones, DM, back pain arthritis.   Clinical Impression   Pt admitted with the above diagnosis and demonstrates the below listed deficits.  He demonstrates poor safety awareness, that per chart review, appears to be his baseline.  He quickly becomes agitated when asked to participate or when provided with feedback or education.  He requires supervision for ADLs, and is unable to consistently maintain NWB, but is insistent he is fine, and no one could maintain NWBing fully.  Attempted education, but pt not receptive nor interested.  He also is not interested in DME.  OT will sign off at this time.     Follow Up Recommendations  No OT follow up;Supervision - Intermittent    Equipment Recommendations  None recommended by OT(pt does not want DME )    Recommendations for Other Services       Precautions / Restrictions Precautions Precautions: Fall Precaution Comments: wound vac on L foot Restrictions Weight Bearing Restrictions: Yes LLE Weight Bearing: Non weight bearing      Mobility Bed Mobility Overal bed mobility: Independent                Transfers Overall transfer level: Needs assistance Equipment used: Rolling walker (2 wheeled) Transfers: Sit to/from Stand Sit to Stand: Supervision         General transfer comment: Requires supervision for safety.  He disregarded VAC tubing, stating "just take that explative off"    Balance Overall balance assessment: Mild deficits observed, not formally tested                                         ADL either performed or assessed with clinical judgement   ADL Overall ADL's : Needs assistance/impaired Eating/Feeding:  Independent   Grooming: Wash/dry hands;Oral care;Wash/dry face;Brushing hair;Set up;Sitting   Upper Body Bathing: Set up;Supervision/ safety;Sitting   Lower Body Bathing: Sit to/from stand;Supervison/ safety Lower Body Bathing Details (indicate cue type and reason): Weight bears through Rt heel when moving sit to stand  Upper Body Dressing : Set up;Sitting   Lower Body Dressing: Sit to/from stand;Supervision/safety Lower Body Dressing Details (indicate cue type and reason): weight bears through Rt heel when moving sit to stand  Toilet Transfer: Stand-pivot;BSC;RW;Supervision/safety Toilet Transfer Details (indicate cue type and reason): unable to fully maintain NWB  Toileting- Clothing Manipulation and Hygiene: Sit to/from stand;Supervision/safety       Functional mobility during ADLs: Rolling walker;Supervision/safety       Vision         Perception     Praxis      Pertinent Vitals/Pain Pain Assessment: Faces Faces Pain Scale: Hurts little more Pain Location: Lt foot Pain Descriptors / Indicators: Discomfort;Grimacing;Guarding Pain Intervention(s): Monitored during session;Limited activity within patient's tolerance     Hand Dominance Right   Extremity/Trunk Assessment Upper Extremity Assessment Upper Extremity Assessment: Overall WFL for tasks assessed   Lower Extremity Assessment Lower Extremity Assessment: Defer to PT evaluation   Cervical / Trunk Assessment Cervical / Trunk Assessment: Normal   Communication Communication Communication: No difficulties   Cognition Arousal/Alertness: Awake/alert Behavior During Therapy: Agitated Overall Cognitive Status: No family/caregiver present to determine  baseline cognitive functioning Area of Impairment: Safety/judgement                         Safety/Judgement: Decreased awareness of safety     General Comments: Pt easily agitates when asked to participate. He is unable to maintain NWB when  transitioning sit to stand, but insists he is able to maintain it, and then indicates that no one would be able to keep their weight off their foot.  He is not at all receptive to education or feedback insisting that his methodology is fully safe.  He reports he takes tub baths and argued when it was suggested that it might be difficult to maintain NWB when getting into and out of tub    General Comments  Pt adamantly refused OOB despite encouragement     Exercises     Shoulder Instructions      Home Living Family/patient expects to be discharged to:: Private residence Living Arrangements: Alone Available Help at Discharge: Family;Friend(s) Type of Home: House Home Access: Stairs to enter [employer] of Steps: 1   Home Layout: One level     Bathroom Shower/Tub: Teacher, early years/pre: [place]: Environmental consultant - 2 wheels;Bedside commode;Wheelchair - manual   Additional Comments: Pt states he has all needed DME       Prior Functioning/Environment Level of Independence: Independent with assistive device(s)        Comments: Pt reports he mostly uses w/c in his home.  He reports he has absolutely no concerns about home and ability to manage at home.  He reports he tub baths and insists this is a safe option.  he reports he has food delivered         OT Problem List: Impaired balance (sitting and/or standing);Decreased cognition;Decreased safety awareness;Decreased knowledge of use of DME or AE;Pain      OT Treatment/Interventions:      OT Goals(Current goals can be found in the care plan section) Acute Rehab OT Goals Patient Stated Goal: to eat lunch  OT Goal Formulation: All assessment and education complete, DC therapy  OT Frequency:     Barriers to D/C:            Co-evaluation              AM-PAC OT "6 Clicks" Daily Activity     Outcome Measure Help from another person eating meals?: None Help from another person taking  care of personal grooming?: A Little Help from another person toileting, which includes using toliet, bedpan, or urinal?: A Little Help from another person bathing (including washing, rinsing, drying)?: A Little Help from another person to put on and taking off regular upper body clothing?: A Little Help from another person to put on and taking off regular lower body clothing?: A Little 6 Click Score: 19   End of Session Equipment Utilized During Treatment: Gait belt;Rolling walker Nurse Communication: Mobility status  Activity Tolerance: Other (comment)(self limiting ) Patient left: in bed;with call bell/phone within reach;with bed alarm set  OT Visit Diagnosis: Unsteadiness on feet (R26.81);Pain Pain - Right/Left: Left Pain - part of body: Ankle and joints of foot                Time: 1259-1312 OT Time Calculation (min): 13 min Charges:  OT General Charges $OT Visit: 1 Visit OT Evaluation $OT Eval Low Complexity: 1 Low  Wendelin Reader C., OTR/L Acute  Rehabilitation Services Pager [phone] Office [place], [place] 05/27/2019, 4:38 PM

## 2019-05-27 NOTE — Anesthesia Postprocedure Evaluation (Signed)
Anesthesia Post Note  Patient: [place]  Procedure(s) Performed: REVISION LEFT TRANSMETATARSAL AMPUTATION (Left Foot)     Patient location during evaluation: PACU Anesthesia Type: General Level of consciousness: awake and alert Pain management: pain level controlled Vital Signs Assessment: post-procedure vital signs reviewed and stable Respiratory status: spontaneous breathing, nonlabored ventilation, respiratory function stable and patient connected to nasal cannula oxygen Cardiovascular status: blood pressure returned to baseline and stable Postop Assessment: no apparent nausea or vomiting Anesthetic complications: no    Last Vitals:  Vitals:   05/27/19 0329 05/27/19 1500  BP: (!) 144/80 138/82  Pulse: 70 72  Resp: 17 18  Temp: 36.8 C 36.9 C  SpO2: 100% 100%    Last Pain:  Vitals:   05/27/19 1600  TempSrc:   PainSc: Asleep                 Effie Berkshire

## 2019-05-27 NOTE — Plan of Care (Signed)

## 2019-05-27 NOTE — Evaluation (Signed)
Physical Therapy Evaluation Patient Details Name: [name] MRN: [id number] DOB: [birth year] Today's Date: 05/27/2019   History of Present Illness  64 y.o. male admitted 05/26/19 s/p L foot wound dihiscence, 2 months s/p status post left transmetatarsal amputation. PMH HTN, kidney stones, DM, back pain arthritis.  Clinical Impression  Pt presents with an overall decrease in functional mobility secondary to above. PTA, pt retired, lives alone in one story home, independent. Pt required coaxing to complete therapy, initially declining and then agreeing to complete therapy now rather than therapy coming back later. Pt inconsistent with requests to walk in hallway, followed by declining to walk in hallway. Pt required coaxing to sit in chair, pt stating he would only sit in chair and amb if "took stuff off of him (lV, wound vac). Pt adamant he would not be participating in physical therapy in hospital or follow up therapy stating " that 80lb girl did not do anything last time, I do not need it". Pt was educ on NWB precautions, requiring constant verbal reminders, despite verbal reminders pt did not follow precautions putting slight weight in LE. Pt to d/c from physical therapy acutely upon request.   Follow Up Recommendations No PT follow up(Pt requests no follow up services)    Equipment Recommendations  None recommended by PT    Recommendations for Other Services       Precautions / Restrictions Precautions Precautions: Fall Precaution Comments: wound vac on L foot Restrictions Weight Bearing Restrictions: Yes LLE Weight Bearing: Non weight bearing      Mobility  Bed Mobility Overal bed mobility: Independent                Transfers Overall transfer level: Modified independent Equipment used: Rolling walker (2 wheeled)             General transfer comment: Pt required HOB elevated for sit to stand. Pt declined to try to stand from level  bed.  Ambulation/Gait Ambulation/Gait assistance: Modified independent (Device/Increase time) Gait Distance (Feet): 4 Feet Assistive device: Rolling walker (2 wheeled) Gait Pattern/deviations: Step-to pattern;Decreased weight shift to left     General Gait Details: Steps to recliner, pt no non adherant to WB precautions despite consistent cueing, pt declined amb further distances.  Stairs            Wheelchair Mobility    Modified Rankin (Stroke Patients Only)       Balance Overall balance assessment: Mild deficits observed, not formally tested   Sitting balance-Leahy Scale: Normal       Standing balance-Leahy Scale: Poor Standing balance comment: Pt required bilateral UE support with RW in stand due to WB precautions                             Pertinent Vitals/Pain Pain Assessment: 0-10 Pain Score: (Reports "20/10 pain") Pain Descriptors / Indicators: Discomfort;Grimacing;Guarding Pain Intervention(s): Limited activity within patient's tolerance;Monitored during session;Patient requesting pain meds-RN notified    Home Living Family/patient expects to be discharged to:: Private residence Living Arrangements: Alone Available Help at Discharge: Family;Friend(s) Type of Home: House Home Access: Stairs to enter   [employer] of Steps: 1 Home Layout: One level Home Equipment: [place] - 2 wheels;Bedside commode;Wheelchair - manual Additional Comments: Pt states he has "all equipment he needs, does not need any more"    Prior Function Level of Independence: Independent with assistive device(s)         Comments: Retired,  uses wheelchair     Hand Dominance        Extremity/Trunk Assessment   Upper Extremity Assessment Upper Extremity Assessment: Overall WFL for tasks assessed    Lower Extremity Assessment Lower Extremity Assessment: Overall WFL for tasks assessed;LLE deficits/detail LLE: Unable to fully assess due to pain        Communication   Communication: No difficulties  Cognition Arousal/Alertness: Awake/alert Behavior During Therapy: Agitated(Upset he was "woken up 4x last night"  states "sleeps during the day normally") Overall Cognitive Status: No family/caregiver present to determine baseline cognitive functioning Area of Impairment: Safety/judgement                         Safety/Judgement: Decreased awareness of safety;Decreased awareness of deficits     General Comments: Pt required contiued reminders to maintain precautions, constantly argued about wound vac stating "can't you just take that thing off", at start off session he removed pulse ox and began to removed IV, pt was reminded to not removed IV and stopped with verbal coaxing.      General Comments General comments (skin integrity, edema, etc.): Observed no notable changes with LLE wound dressing. Pt required coaxing to complete session, pt inconsistent with requests, initally refusing therapy then agreeing to complete therapy, inconsistent with desire to walk in the hall. Offered multiple times, pt initally declined, then stated "arent we going to walk in the hall", PT offered to walk in hall again, pt declined stating "I will only walk in the hall if someone takes these things (IV, wound vac) off me".    Exercises     Assessment/Plan    PT Assessment Patent does not need any further PT services  PT Problem List         PT Treatment Interventions      PT Goals (Current goals can be found in the Care Plan section)  Acute Rehab PT Goals PT Goal Formulation: All assessment and education complete, DC therapy    Frequency     Barriers to discharge        Co-evaluation               AM-PAC PT "6 Clicks" Mobility  Outcome Measure Help needed turning from your back to your side while in a flat bed without using bedrails?: None Help needed moving from lying on your back to sitting on the side of a flat bed without  using bedrails?: None Help needed moving to and from a bed to a chair (including a wheelchair)?: None Help needed standing up from a chair using your arms (e.g., wheelchair or bedside chair)?: None Help needed to walk in hospital room?: A Little Help needed climbing 3-5 steps with a railing? : A Lot 6 Click Score: 21    End of Session   Activity Tolerance: Treatment limited secondary to agitation Patient left: in chair;with call bell/phone within reach;with chair alarm set Nurse Communication: Mobility status;Other (comment)(Pt desire to no longer complete therapy, request for medication.) PT Visit Diagnosis: Other abnormalities of gait and mobility (R26.89)    Time: 5643-3295 PT Time Calculation (min) (ACUTE ONLY): 23 min   Charges:   PT Evaluation $PT Eval Moderate Complexity: 1 Mod PT Treatments $Therapeutic Activity: 8-22 mins        [employer] SPT 05/27/2019   Rolland Porter 05/27/2019, 10:05 AM

## 2019-05-28 LAB — GLUCOSE, CAPILLARY
Glucose-Capillary: 65 mg/dL — ABNORMAL LOW (ref 70–99)
Glucose-Capillary: 90 mg/dL (ref 70–99)
Glucose-Capillary: 165 mg/dL — ABNORMAL HIGH (ref 70–99)

## 2019-05-28 NOTE — Progress Notes (Signed)
Pt IV removed, belongings gathered and pt dressed. Ride called to take him home. Pt connected to preevena pump for home use and educated on it, AVS reviewed. All questions answered to pt satisfaction.

## 2019-05-28 NOTE — Progress Notes (Signed)
Patient is postop day 2 status post left transmetatarsal amputation.  He is overall doing well.  He will have family this weekend to help him.  Vital signs stable afebrile he has had no further drainage in the canister seal is good  Plan for discharge today follow-up in office next week with PA or nurse practitioner Will transition to a Prevena pump

## 2019-05-28 NOTE — Discharge Summary (Signed)
Discharge Diagnoses:  Active Problems:   Dehiscence of amputation stump (HCC)   Wound dehiscence   Surgeries: Procedure(s): REVISION LEFT TRANSMETATARSAL AMPUTATION on 05/26/2019    Consultants:   Discharged Condition: Improved  Hospital Course: [name] is an 64 y.o. male who was admitted 05/26/2019 with a chief complaint of wound dehiscence amputation stump, with a final diagnosis of Dehiscence Left Transmetatarsal Amputation.  Patient was brought to the operating room on 05/26/2019 and underwent Procedure(s): REVISION LEFT TRANSMETATARSAL AMPUTATION.    Patient was given perioperative antibiotics:  Anti-infectives (From admission, onward)   Start     Dose/Rate Route Frequency Ordered Stop   05/26/19 2130  ceFAZolin (ANCEF) IVPB 1 g/50 mL premix     1 g 100 mL/hr over 30 Minutes Intravenous Every 6 hours 05/26/19 1731 05/27/19 1228   05/26/19 0900  ceFAZolin (ANCEF) IVPB 2g/100 mL premix     2 g 200 mL/hr over 30 Minutes Intravenous On call to O.R. 05/26/19 3335 05/26/19 1335    .  Patient was given sequential compression devices, early ambulation, and aspirin for DVT prophylaxis.  Recent vital signs:  Patient Vitals for the past 24 hrs:  BP Temp Temp src Pulse Resp SpO2  05/28/19 0301 (!) 144/87 98.5 F (36.9 C) Oral 77 17 99 %  05/27/19 1951 (!) 143/85 98.6 F (37 C) Oral 74 17 98 %  05/27/19 1500 138/82 98.4 F (36.9 C) Oral 72 18 100 %  .  Recent laboratory studies: No results found.  Discharge Medications:   Allergies as of 05/28/2019      Reactions   Other Hives   Anti-snake venim for copperhead venom.   Pravastatin Other (See Comments)   Leg cramps, elevated CK   Prednisone Other (See Comments)   Patient states "it runs my blood sugar up so I don't take it."   Toradol [ketorolac Tromethamine] Hives, Nausea And Vomiting   Pill form only      Medication List    STOP taking these medications   mupirocin ointment 2 % Commonly known as: Bactroban      TAKE these medications   allopurinol 300 MG tablet Commonly known as: ZYLOPRIM Take 300 mg by mouth daily.   amitriptyline 25 MG tablet Commonly known as: ELAVIL Take 25 mg by mouth at bedtime.   amLODipine 10 MG tablet Commonly known as: NORVASC Take 5 mg by mouth daily.   aspirin EC 81 MG tablet Take 81 mg by mouth every other day.   furosemide 20 MG tablet Commonly known as: LASIX Take 20 mg by mouth daily.   gabapentin 300 MG capsule Commonly known as: NEURONTIN Take 300 mg by mouth 3 (three) times daily.   Levemir FlexTouch 100 UNIT/ML FlexPen Generic drug: insulin detemir Inject 70 Units into the skin 2 (two) times daily.   lisinopril 40 MG tablet Commonly known as: ZESTRIL Take 40 mg by mouth daily.   metoprolol succinate 100 MG 24 hr tablet Commonly known as: TOPROL-XL Take 100 mg by mouth daily.   Mitigare 0.6 MG Caps Generic drug: Colchicine Take 2 capsules by mouth daily.   Oxycodone HCl 10 MG Tabs Take 10 mg by mouth every 6 (six) hours.   pentoxifylline 400 MG CR tablet Commonly known as: TRENTAL Take 1 tablet (400 mg total) by mouth 3 (three) times daily with meals.   tizanidine 2 MG capsule Commonly known as: ZANAFLEX Take 2 mg by mouth 3 (three) times daily.  Diagnostic Studies: No results found.  Patient benefited maximally from their hospital stay and there were no complications.     Disposition: Discharge disposition: 01-Home or Self Care      Discharge Instructions    Call MD / Call 911   Complete by: As directed    If you experience chest pain or shortness of breath, CALL 911 and be transported to the hospital emergency room.  If you develope a fever above 101 F, pus (white drainage) or increased drainage or redness at the wound, or calf pain, call your surgeon's office.   Constipation Prevention   Complete by: As directed    Drink plenty of fluids.  Prune juice may be helpful.  You may use a stool softener, such as  Colace (over the counter) 100 mg twice a day.  Use MiraLax (over the counter) for constipation as needed.   Diet - low sodium heart healthy   Complete by: As directed    Discharge instructions   Complete by: As directed    Elevate  Left foot. Keep dressing dry and in place.   Increase activity slowly as tolerated   Complete by: As directed    Negative Pressure Wound Therapy - Incisional   Complete by: As directed    Show patient how to attach prevena pump     Follow-up Information    Suzan Slick, NP In 1 week.   Specialty: Orthopedic Surgery Contact information: [address] [place] [address] [phone]            Signed: Bevely Palmer Tatjana Turcott 05/28/2019, 7:27 AM

## 2019-05-28 NOTE — Progress Notes (Signed)
Hypoglycemic Event  CBG: 65  Treatment: graham crackers and breakfast   Symptoms: none  Follow-up CBG: Time:0810 CBG Result:90  Possible Reasons for Event: pt had late breakfast  Comments/MD notified:VSS? Pt asymptomatic    [name] L Rollene Rotunda

## 2019-05-28 NOTE — Plan of Care (Signed)
  Problem: Education: Goal: Knowledge of General Education information will improve Description Including pain rating scale, medication(s)/side effects and non-pharmacologic comfort measures Outcome: Progressing   Problem: Health Behavior/Discharge Planning: Goal: Ability to manage health-related needs will improve Outcome: Progressing   

## 2019-06-01 ENCOUNTER — Telehealth: Payer: Self-pay | Admitting: Radiology

## 2019-06-01 ENCOUNTER — Telehealth: Payer: Self-pay

## 2019-06-01 ENCOUNTER — Telehealth: Payer: Self-pay | Admitting: Orthopedic Surgery

## 2019-06-01 NOTE — Telephone Encounter (Signed)
Called and left a VM for patient to call back concerning wound vac.

## 2019-06-01 NOTE — Telephone Encounter (Signed)
I called and lm on vm to advise that the pt had the vac placed on 05/26/18 and these are designed to stay intact for a week as long as it is still functioning properly we can remove at his visit on 06/03/18. To call with any questions.

## 2019-06-01 NOTE — Telephone Encounter (Signed)
Patient called triage phone, states that his wound vac is making popping and clicking noises. States HH cam out Monday and looked at it and told him they couldn't do anything with it, I called and spoke with Caryl Pina, advised her her of the situation and she spoke with [name] who advised to have to take off the wound vac and place a dry dressing on it and to keep his scheduled appt for 06/03/19.  I advised the patient of this and he states that he couldn't do that, that he would have to call [facility] and see if they could come out and do it.  I advised that if they need orders for it that I would document them in the chart----so they need to remove the wound vac and apply dry dressing and patient is to follow up as scheduled on 06/03/19.

## 2019-06-01 NOTE — Telephone Encounter (Signed)
Anderson Malta from [facility] called.  Said the Patient said he was told to remove wound vac but she needs verbal orders to do so.   Call back: [phone]

## 2019-06-02 ENCOUNTER — Telehealth: Payer: Self-pay | Admitting: Orthopedic Surgery

## 2019-06-02 ENCOUNTER — Other Ambulatory Visit: Payer: Self-pay

## 2019-06-02 ENCOUNTER — Ambulatory Visit (INDEPENDENT_AMBULATORY_CARE_PROVIDER_SITE_OTHER): Payer: Medicaid Other | Admitting: Family

## 2019-06-02 ENCOUNTER — Encounter: Payer: Self-pay | Admitting: Family

## 2019-06-02 VITALS — Ht 74.0 in | Wt 235.0 lb

## 2019-06-02 DIAGNOSIS — T8130XA Disruption of wound, unspecified, initial encounter: Secondary | ICD-10-CM

## 2019-06-02 DIAGNOSIS — Z89432 Acquired absence of left foot: Secondary | ICD-10-CM

## 2019-06-02 NOTE — Telephone Encounter (Signed)
Christy w/ [facility] called to place a verbal order with the frequency of, once a week for 1 week and then twice a week for 2 weeks and finally once a week for 4 weeks.   [phone]

## 2019-06-02 NOTE — Telephone Encounter (Signed)
Called and lm on vm to advise ok for HHN orders as requested.

## 2019-06-02 NOTE — Progress Notes (Signed)
Post-Op Visit Note   Patient: [name]           Date of Birth: 1955/09/14           MRN: [id number] Visit Date: 06/02/2019 PCP: [name], [place]  Chief Complaint:  Chief Complaint  Patient presents with  . Left Foot - Routine Post Op    05/26/19 left transmet amputation     HPI:  HPI The patient is a 64 year old gentleman seen today 1 week status post revision left foot transmetatarsal amputation.  Yesterday home health assisted him with his wound VAC which had no longer been working.  A dry dressing was applied.  He is full weightbearing today into the office states he walks as little as is necessary in the home.  He is concerned that his incision is already opening up  Ortho Exam On examination of the left foot he does have some moderate swelling.  There is very mild erythema.  There is no warmth.  Unfortunately his incision is beginning to gape along the full length of it.  It is open about 3 mm.  There is no active drainage no odor.  Visit Diagnoses: No diagnosis found.  Plan: Discussed the importance of elevation and nonweightbearing using Ace for compression.  He will begin daily Dial soap cleansing.  Dry dressing changes.  He does have the help of advanced home care but he knows that he needs to change his dressing daily, on days that they are not coming out  Follow-Up Instructions: No follow-ups on file.   Imaging: No results found.  Orders:  No orders of the defined types were placed in this encounter.  No orders of the defined types were placed in this encounter.    PMFS History: Patient Active Problem List   Diagnosis Date Noted  . Wound dehiscence 05/26/2019  . Dehiscence of amputation stump ([place])   . Wound infection   . Acute osteomyelitis of left foot ([place]) 04/11/2019  . Lactic acidosis 04/11/2019  . Sepsis due to Enterobacter ([place]) 04/11/2019  . Diabetic foot ulcer ([place]) 03/25/2019  . Idiopathic chronic gout of left foot  without tophus   . Myelopathy ([place]) 07/16/2013  . Ingrown nail 07/05/2013  . Paronychia 07/05/2013  . Diverticulosis 04/22/2013  . Acute osteomyelitis of humerus ([place]) 02/07/2013  . Osteomyelitis of left foot ([place]) 02/01/2013  . Joint infection ([place]) 01/22/2013  . Pyogenic arthritis of shoulder region ([place]) 01/15/2013  . Nephrolithiasis 01/23/2012  . Lumbar radiculopathy 02/28/2011  . Acute gout of right knee 04/30/2010  . Chronic pain 02/14/2010  . NASH (nonalcoholic steatohepatitis) 11/08/2008  . Obesity 10/27/2008  . Hypertriglyceridemia 10/27/2007  . Uncontrolled type 2 diabetes mellitus with hyperglycemia, with long-term current use of insulin ([place]) 03/12/2006  . Essential hypertension, benign 03/12/2006   Past Medical History:  Diagnosis Date  . Arthritis   . Back pain   . Diabetes mellitus without complication ([place])   . Diabetic foot ulcer ([place]) 03/2019  . History of kidney stones   . Hypertension     Family History  Problem Relation Age of Onset  . Heart failure Mother   . Cancer Father     Past Surgical History:  Procedure Laterality Date  . BACK SURGERY    . CARPAL TUNNEL RELEASE    . CHOLECYSTECTOMY    . FOOT SURGERY    . JOINT REPLACEMENT    . REVISION OF TRANSMETATARSAL AMPUTATION Right 05/26/2019  . SHOULDER ARTHROSCOPY    .  STUMP REVISION Left 05/26/2019   Procedure: REVISION LEFT TRANSMETATARSAL AMPUTATION;  Surgeon: [name], MD;  Location: [place];  Service: Orthopedics;  Laterality: Left;  . TOTAL KNEE ARTHROPLASTY    . TRANSMETATARSAL AMPUTATION Left 03/27/2019   Procedure: TRANSMETATARSAL AMPUTATION left foot;  Surgeon: [name], MD;  Location: [facility];  Service: Orthopedics;  Laterality: Left;   Social History   Occupational History  . Not on file  Tobacco Use  . Smoking status: Never Smoker  . Smokeless tobacco: Never Used  Substance and Sexual Activity  . Alcohol use: No  . Drug use: No  . Sexual activity: Not on file

## 2019-06-03 ENCOUNTER — Inpatient Hospital Stay: Payer: Medicaid Other | Admitting: Physician Assistant

## 2019-06-04 ENCOUNTER — Other Ambulatory Visit: Payer: Self-pay | Admitting: Physician Assistant

## 2019-06-11 ENCOUNTER — Telehealth: Payer: Self-pay | Admitting: Orthopedic Surgery

## 2019-06-11 NOTE — Telephone Encounter (Signed)
[name] with [facility] called to get clarification on wound care orders.  CB#[phone].  Thank you.

## 2019-06-11 NOTE — Telephone Encounter (Signed)
Called and sw HHN to advise of dry dressing changes non weight bearing, elevation and ace for compression.

## 2019-06-16 ENCOUNTER — Ambulatory Visit: Payer: Medicaid Other | Admitting: Family

## 2019-07-08 ENCOUNTER — Other Ambulatory Visit: Payer: Self-pay

## 2019-07-08 ENCOUNTER — Ambulatory Visit: Payer: Self-pay

## 2019-07-08 ENCOUNTER — Ambulatory Visit (INDEPENDENT_AMBULATORY_CARE_PROVIDER_SITE_OTHER): Payer: Medicaid Other | Admitting: Orthopedic Surgery

## 2019-07-08 ENCOUNTER — Encounter: Payer: Self-pay | Admitting: Orthopedic Surgery

## 2019-07-08 DIAGNOSIS — M5416 Radiculopathy, lumbar region: Secondary | ICD-10-CM | POA: Diagnosis not present

## 2019-07-08 DIAGNOSIS — Z89432 Acquired absence of left foot: Secondary | ICD-10-CM
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
## 2019-07-22 ENCOUNTER — Ambulatory Visit (INDEPENDENT_AMBULATORY_CARE_PROVIDER_SITE_OTHER): Payer: Medicaid Other | Admitting: Orthopedic Surgery

## 2019-07-22 ENCOUNTER — Encounter: Payer: Self-pay | Admitting: Orthopedic Surgery

## 2019-07-22 ENCOUNTER — Other Ambulatory Visit: Payer: Self-pay

## 2019-07-22 VITALS — Ht 74.0 in | Wt 235.0 lb

## 2019-07-22 DIAGNOSIS — Z89432 Acquired absence of left foot: Secondary | ICD-10-CM

## 2019-07-22 DIAGNOSIS — G8929 Other chronic pain: Secondary | ICD-10-CM | POA: Diagnosis not present

## 2019-07-22 DIAGNOSIS — M25561 Pain in right knee: Secondary | ICD-10-CM | POA: Diagnosis not present

## 2019-07-22 NOTE — Progress Notes (Signed)
Office Visit Note   Patient: [name]           Date of Birth: July 07, 1955           MRN: [id number] Visit Date: 07/22/2019              Requested by: [name] [facility] [place],  [place] [address] PCP: [facility], [facility]  Chief Complaint  Patient presents with   Lower Back - Pain, Follow-up      HPI: Patient is a 64 year old gentleman is about 2 months status post left transmetatarsal amputation.  Patient states that he has maxed out on his injection for his lumbar spine.  He states he is currently taking Neurontin 3 times a day and also complains of right knee pain.  Patient states that he has had good relief with previous hyaluronic acid injections.  Assessment & Plan: Visit Diagnoses:  1. History of transmetatarsal amputation of left foot ([place])     Plan: Right knee was injected he tolerated this well we will request authorization for Synvisc injection.  Continue with wound care minimize weightbearing on the left foot.  Follow-Up Instructions: Return in about 4 weeks (around 08/19/2019).   Ortho Exam  Patient is alert, oriented, no adenopathy, well-dressed, normal affect, normal respiratory effort. Examination patient has persistent wound over the left transmetatarsal amputation that measures 5 x 20 mm and is 1 mm deep there is good granulation tissue in the wound bed no signs of infection.  Right knee is tender to palpation of the medial joint line collaterals and cruciates are stable.  Imaging: No results found. No images are attached to the encounter.  Labs: Lab Results  Component Value Date   HGBA1C 6.0 (H) 05/26/2019   HGBA1C 7.2 (H) 03/25/2019   HGBA1C 7.1 (H) 03/28/2017   ESRSEDRATE 81 (H) 03/25/2019   CRP 1.4 (H) 04/11/2019   CRP 1.9 (H) 03/25/2019   LABURIC 5.6 04/13/2019   REPTSTATUS 04/12/2019 FINAL 04/11/2019   GRAMSTAIN  03/27/2019    RARE WBC PRESENT,BOTH PMN AND MONONUCLEAR NO ORGANISMS SEEN      CULT  04/11/2019    NO GROWTH Performed at [facility], [place] [address], [place], [place] [address]    [place] AEROGENES 03/27/2019     Lab Results  Component Value Date   ALBUMIN 3.0 (L) 04/12/2019   ALBUMIN 3.5 04/11/2019   ALBUMIN 3.7 03/25/2019   PREALBUMIN 17.6 (L) 03/25/2019   LABURIC 5.6 04/13/2019    No results found for: MG No results found for: VD25OH  Lab Results  Component Value Date   PREALBUMIN 17.6 (L) 03/25/2019   CBC EXTENDED Latest Ref Rng & Units 05/26/2019 04/15/2019 04/13/2019  WBC 4.0 - 10.5 K/uL 5.2 5.2 5.3  RBC 4.22 - 5.81 MIL/uL 3.62(L) 2.87(L) 2.81(L)  HGB 13.0 - 17.0 g/dL 10.3(L) 8.8(L) 8.6(L)  HCT 39 - 52 % 32.2(L) 27.5(L) 26.7(L)  PLT 150 - 400 K/uL 206 132(L) 129(L)  NEUTROABS 1.7 - 7.7 K/uL - - -  LYMPHSABS 0.7 - 4.0 K/uL - - -     Body mass index is 30.17 kg/m.  Orders:  No orders of the defined types were placed in this encounter.  No orders of the defined types were placed in this encounter.    Procedures: Large Joint Inj: R knee on 07/23/2019 2:51 PM Indications: pain and diagnostic evaluation Details: 22 G 1.5 in needle, anteromedial approach  Arthrogram: No  Medications: 5  mL lidocaine (PF) 1 %; 40 mg methylPREDNISolone acetate 40 MG/ML Outcome: tolerated well, no immediate complications Procedure, treatment alternatives, risks and benefits explained, specific risks discussed. Consent was given by the patient. Immediately prior to procedure a time out was called to verify the correct patient, procedure, equipment, support staff and site/side marked as required. Patient was prepped and draped in the usual sterile fashion.      Clinical Data: No additional findings.  ROS:  All other systems negative, except as noted in the HPI. Review of Systems  Objective: Vital Signs: Ht 6' 2"  (1.88 m)    Wt 235 lb (106.6 kg)    BMI 30.17 kg/m   Specialty Comments:  No specialty comments available.  PMFS  History: Patient Active Problem List   Diagnosis Date Noted   Wound dehiscence 05/26/2019   Dehiscence of amputation stump ([place])    Wound infection    Acute osteomyelitis of left foot ([place]) 04/11/2019   Lactic acidosis 04/11/2019   Sepsis due to Enterobacter ([place]) 04/11/2019   Diabetic foot ulcer ([place]) 03/25/2019   Idiopathic chronic gout of left foot without tophus    Myelopathy ([facility]) 07/16/2013   Ingrown nail 07/05/2013   Paronychia 07/05/2013   Diverticulosis 04/22/2013   Acute osteomyelitis of humerus ([place]) 02/07/2013   Osteomyelitis of left foot ([place]) 02/01/2013   Joint infection ([place]) 01/22/2013   Pyogenic arthritis of shoulder region ([place]) 01/15/2013   Nephrolithiasis 01/23/2012   Lumbar radiculopathy 02/28/2011   Acute gout of right knee 04/30/2010   Chronic pain 02/14/2010   NASH (nonalcoholic steatohepatitis) 11/08/2008   Obesity 10/27/2008   Hypertriglyceridemia 10/27/2007   Uncontrolled type 2 diabetes mellitus with hyperglycemia, with long-term current use of insulin ([place]) 03/12/2006   Essential hypertension, benign 03/12/2006   Past Medical History:  Diagnosis Date   Arthritis    Back pain    Diabetes mellitus without complication ([place])    Diabetic foot ulcer ([place]) 03/2019   History of kidney stones    Hypertension     Family History  Problem Relation Age of Onset   Heart failure Mother    Cancer Father     Past Surgical History:  Procedure Laterality Date   BACK SURGERY     CARPAL TUNNEL RELEASE     CHOLECYSTECTOMY     FOOT SURGERY     JOINT REPLACEMENT     REVISION OF TRANSMETATARSAL AMPUTATION Right 05/26/2019   SHOULDER ARTHROSCOPY     STUMP REVISION Left 05/26/2019   Procedure: REVISION LEFT TRANSMETATARSAL AMPUTATION;  Surgeon: [name], MD;  Location: [facility];  Service: Orthopedics;  Laterality: Left;   TOTAL KNEE ARTHROPLASTY     TRANSMETATARSAL AMPUTATION Left 03/27/2019   Procedure:  TRANSMETATARSAL AMPUTATION left foot;  Surgeon: [name], MD;  Location: [place];  Service: Orthopedics;  Laterality: Left;   Social History   Occupational History   Not on file  Tobacco Use   Smoking status: Never Smoker   Smokeless tobacco: Never Used  Vaping Use   Vaping Use: Never used  Substance and Sexual Activity   Alcohol use: No   Drug use: No   Sexual activity: Not on file

## 2019-07-23 ENCOUNTER — Encounter: Payer: Self-pay | Admitting: Orthopedic Surgery

## 2019-07-23 ENCOUNTER — Telehealth: Payer: Self-pay

## 2019-07-23 DIAGNOSIS — M25561 Pain in right knee: Secondary | ICD-10-CM

## 2019-07-23 DIAGNOSIS — G8929 Other chronic pain: Secondary | ICD-10-CM

## 2019-07-23 MED ORDER — METHYLPREDNISOLONE ACETATE 40 MG/ML IJ SUSP
40.0000 mg | INTRAMUSCULAR | Status: AC | PRN
  Administered 2019-07-23: 40 mg via INTRA_ARTICULAR

## 2019-07-23 MED ORDER — LIDOCAINE HCL (PF) 1 % IJ SOLN
5.0000 mL | INTRAMUSCULAR | Status: AC | PRN
  Administered 2019-07-23: 5 mL

## 2019-07-23 NOTE — Telephone Encounter (Signed)
-----   Message from Lorayne Bender, [place] sent at 07/22/2019 10:03 AM EDT ----- Regarding: Synvisc Patient would like to get pre-approved for gel injection for Left Knee. Please advise

## 2019-07-23 NOTE — Telephone Encounter (Signed)
Will mail patient [employer] patient assistance application for Monovisc, left knee.

## 2019-07-26 ENCOUNTER — Telehealth: Payer: Self-pay

## 2019-07-26 NOTE — Telephone Encounter (Signed)
Mailed [employer] application to patient's address listed for Monovisc, left knee.

## 2019-07-28 ENCOUNTER — Telehealth: Payer: Self-pay | Admitting: Orthopedic Surgery

## 2019-07-28 NOTE — Telephone Encounter (Signed)
Patient called.   Wants to discuss some symptoms he's experiencing with his big toe. Thinks he might need to come in  Call back: [phone]

## 2019-07-28 NOTE — Telephone Encounter (Signed)
Dr Sharol Given, I called patient and he informed me that he was going to [facility] to get admitted because he is having issues with his GT on right foot with drainage, toenail is dark and about to come off, pain, and swelling. He states he would like for you to know and he is very concerned due to hx of left TMA. His number is [phone]. Thank you

## 2019-08-19 ENCOUNTER — Ambulatory Visit (INDEPENDENT_AMBULATORY_CARE_PROVIDER_SITE_OTHER): Payer: Medicaid Other | Admitting: Physician Assistant

## 2019-08-19 ENCOUNTER — Encounter: Payer: Self-pay | Admitting: Physician Assistant

## 2019-08-19 ENCOUNTER — Other Ambulatory Visit: Payer: Self-pay

## 2019-08-19 VITALS — Ht 74.0 in | Wt 235.0 lb

## 2019-08-19 DIAGNOSIS — Z89432 Acquired absence of left foot: Secondary | ICD-10-CM

## 2019-08-19 DIAGNOSIS — M86129 Other acute osteomyelitis, unspecified humerus: Secondary | ICD-10-CM

## 2019-08-19 MED ORDER — NITROGLYCERIN 0.2 MG/HR TD PT24: 0.2000 mg | MEDICATED_PATCH | Freq: Every day | TRANSDERMAL | 1 refills | Status: DC

## 2019-08-19 NOTE — Progress Notes (Signed)
Office Visit Note   Patient: [name]           Date of Birth: [birth year]           MRN: [id number] Visit Date: 08/19/2019              Requested by: [name] [facility] [place],  [place] [address] PCP: [facility], [facility]  Chief Complaint  Patient presents with  . Left Foot - Follow-up    Hx transmet amputation   . Right Foot - Follow-up      HPI: This is a pleasant gentleman who is status post left transmetatarsal amputation.  He is currently putting lotion on this and it continues to improve.  He also is concerned because his right great toenail is dark.  He said it has been like this for quite a while.  He had it removed once but it grew back.  We are also in the process of getting authorization for viscosupplementation on his right knee  Assessment & Plan: Visit Diagnoses: No diagnosis found.  Plan: Patient was provided a prescription for diabetic extra-depth shoes with carbon fiber plate and filler for the left.  He has had very significant difficulties with balance with his transmetatarsal amputation and has fallen.  This is medically necessary. Follow-Up Instructions: No follow-ups on file.   Ortho Exam  Patient is alert, oriented, no adenopathy, well-dressed, normal affect, normal respiratory effort. Left foot overall significant improvement from last visit he just has a very small area about 4 cm long and a millimeter wide of granulation tissue.  There is no drainage no surrounding cellulitis no fluctuance no sign of infection Right great toenail onychomycotic nail there is no surrounding cellulitis drainage or foul odor.  This was trimmed back to a little more stable surface.  Brisk capillary refill to the toe no sign of infection  Imaging: No results found. No images are attached to the encounter.  Labs: Lab Results  Component Value Date   HGBA1C 6.0 (H) 05/26/2019   HGBA1C 7.2 (H) 03/25/2019   HGBA1C 7.1 (H)  03/28/2017   ESRSEDRATE 81 (H) 03/25/2019   CRP 1.4 (H) 04/11/2019   CRP 1.9 (H) 03/25/2019   LABURIC 5.6 04/13/2019   REPTSTATUS 04/12/2019 FINAL 04/11/2019   GRAMSTAIN  03/27/2019    RARE WBC PRESENT,BOTH PMN AND MONONUCLEAR NO ORGANISMS SEEN    CULT  04/11/2019    NO GROWTH Performed at [facility], [place] [address] [place], [place] [address]    [place] AEROGENES 03/27/2019     Lab Results  Component Value Date   ALBUMIN 3.0 (L) 04/12/2019   ALBUMIN 3.5 04/11/2019   ALBUMIN 3.7 03/25/2019   PREALBUMIN 17.6 (L) 03/25/2019   LABURIC 5.6 04/13/2019    No results found for: MG No results found for: VD25OH  Lab Results  Component Value Date   PREALBUMIN 17.6 (L) 03/25/2019   CBC EXTENDED Latest Ref Rng & Units 05/26/2019 04/15/2019 04/13/2019  WBC 4.0 - 10.5 K/uL 5.2 5.2 5.3  RBC 4.22 - 5.81 MIL/uL 3.62(L) 2.87(L) 2.81(L)  HGB 13.0 - 17.0 g/dL 10.3(L) 8.8(L) 8.6(L)  HCT 39 - 52 % 32.2(L) 27.5(L) 26.7(L)  PLT 150 - 400 K/uL 206 132(L) 129(L)  NEUTROABS 1.7 - 7.7 K/uL - - -  LYMPHSABS 0.7 - 4.0 K/uL - - -     Body mass index is 30.17 kg/m.  Orders:  No orders of the defined types were  placed in this encounter.  Meds ordered this encounter  Medications  . nitroGLYCERIN (NITRODUR - DOSED IN MG/24 HR) 0.2 mg/hr patch    Sig: Place 1 patch (0.2 mg total) onto the skin daily. Apply new patch remove old patch daily on foot . Place in different place on top of foot    Dispense:  30 patch    Refill:  1     Procedures: No procedures performed  Clinical Data: No additional findings.  ROS:  All other systems negative, except as noted in the HPI. Review of Systems  Objective: Vital Signs: Ht 6' 2"  (1.88 m)   Wt 235 lb (106.6 kg)   BMI 30.17 kg/m   Specialty Comments:  No specialty comments available.  PMFS History: Patient Active Problem List   Diagnosis Date Noted  . Wound dehiscence 05/26/2019  . Dehiscence of amputation stump ([place])    . Wound infection   . Acute osteomyelitis of left foot ([place]) 04/11/2019  . Lactic acidosis 04/11/2019  . Sepsis due to Enterobacter ([place]) 04/11/2019  . Diabetic foot ulcer ([place]) 03/25/2019  . Idiopathic chronic gout of left foot without tophus   . Myelopathy ([place]) 07/16/2013  . Ingrown nail 07/05/2013  . Paronychia 07/05/2013  . Diverticulosis 04/22/2013  . Acute osteomyelitis of humerus ([place]) 02/07/2013  . Osteomyelitis of left foot ([place]) 02/01/2013  . Joint infection ([place]) 01/22/2013  . Pyogenic arthritis of shoulder region ([place]) 01/15/2013  . Nephrolithiasis 01/23/2012  . Lumbar radiculopathy 02/28/2011  . Acute gout of right knee 04/30/2010  . Chronic pain 02/14/2010  . NASH (nonalcoholic steatohepatitis) 11/08/2008  . Obesity 10/27/2008  . Hypertriglyceridemia 10/27/2007  . Uncontrolled type 2 diabetes mellitus with hyperglycemia, with long-term current use of insulin ([place]) 03/12/2006  . Essential hypertension, benign 03/12/2006   Past Medical History:  Diagnosis Date  . Arthritis   . Back pain   . Diabetes mellitus without complication ([place])   . Diabetic foot ulcer ([place]) 03/2019  . History of kidney stones   . Hypertension     Family History  Problem Relation Age of Onset  . Heart failure Mother   . Cancer Father     Past Surgical History:  Procedure Laterality Date  . BACK SURGERY    . CARPAL TUNNEL RELEASE    . CHOLECYSTECTOMY    . FOOT SURGERY    . JOINT REPLACEMENT    . REVISION OF TRANSMETATARSAL AMPUTATION Right 05/26/2019  . SHOULDER ARTHROSCOPY    . STUMP REVISION Left 05/26/2019   Procedure: REVISION LEFT TRANSMETATARSAL AMPUTATION;  Surgeon: [name], MD;  Location: [facility];  Service: Orthopedics;  Laterality: Left;  . TOTAL KNEE ARTHROPLASTY    . TRANSMETATARSAL AMPUTATION Left 03/27/2019   Procedure: TRANSMETATARSAL AMPUTATION left foot;  Surgeon: [name], MD;  Location: [place];  Service: Orthopedics;  Laterality: Left;   Social History    Occupational History  . Not on file  Tobacco Use  . Smoking status: Never Smoker  . Smokeless tobacco: Never Used  Vaping Use  . Vaping Use: Never used  Substance and Sexual Activity  . Alcohol use: No  . Drug use: No  . Sexual activity: Not on file

## 2019-08-26 ENCOUNTER — Telehealth: Payer: Self-pay

## 2019-08-26 NOTE — Telephone Encounter (Signed)
Talked with patient concerning gel injection.  Patient stated that he did receive [employer] application for gel injection, right knee.  Advised patient to go ahead and complete the application and return it to the office.  Patient voiced that he understands.

## 2019-08-26 NOTE — Telephone Encounter (Signed)
-----   Message from Robyne Peers, RT sent at 08/23/2019  8:36 AM EDT ----- Regarding: FW: gel injection Govind Furey you were working on this one ----- Message ----- From: Pamella Pert, RMA Sent: 08/19/2019   2:50 PM EDT To: Robyne Peers, RT Subject: FW: gel injection                              Yes maam there is an artifical joint in the left. The dictation from his last visit says right  ----- Message ----- From: Hornaday, Autumn L, RT Sent: 08/19/2019   1:54 PM EDT To: Pamella Pert, RMA Subject: FW: gel injection                              This is what happened...so it needs to be right knee?   Note ----- Message from Lorayne Bender, CMA sent at 07/22/2019 10:03 AM EDT ----- Regarding: [employer] Patient would like to get pre-approved for gel injection for Left Knee. Please advise     ----- Message ----- From: Pamella Pert, RMA Sent: 08/19/2019   8:20 AM EDT To: Robyne Peers, RT Subject: gel injection                                  This pt was supposed to get gel injection approval for the right knee and its documented for the left. Can you please follow up on this?

## 2019-09-02 ENCOUNTER — Ambulatory Visit: Payer: Medicaid Other | Admitting: Physician Assistant

## 2019-09-08 ENCOUNTER — Encounter: Payer: Self-pay | Admitting: Physician Assistant

## 2019-09-08 ENCOUNTER — Ambulatory Visit (INDEPENDENT_AMBULATORY_CARE_PROVIDER_SITE_OTHER): Payer: Medicaid Other | Admitting: Physician Assistant

## 2019-09-08 DIAGNOSIS — Z89432 Acquired absence of left foot: Secondary | ICD-10-CM

## 2019-09-08 NOTE — Progress Notes (Signed)
Office Visit Note   Patient: [name]           Date of Birth: Apr 13, 1955           MRN: [id number] Visit Date: 09/08/2019              Requested by: [name] [facility] [place],  [place] [address] PCP: [facility]  No chief complaint on file.     HPI: This is a pleasant [age]-year-old gentleman who is 3 and half months status post revision of left transmetatarsal amputation.  He is doing very well.  He is trying to obtain a shoe with a filler but the referral has to come from his primary care doctor.  He still has just a small eschar on the end of the incision denies any pain  Assessment & Plan: Visit Diagnoses: No diagnosis found.  Plan: Patient will follow up in 4 weeks.  He asked about tattooing the area and I asked that he wait another month  Follow-Up Instructions: No follow-ups on file.   Ortho Exam  Patient is alert, oriented, no adenopathy, well-dressed, normal affect, normal respiratory effort. Focused examination demonstrates well-healed surgical incision no erythema no cellulitis no drainage no foul odor skin is in good condition.  He has 1 very small callus.  This was debrided to a healthy soft surface follow-up in 1 month  Imaging: No results found. No images are attached to the encounter.  Labs: Lab Results  Component Value Date   HGBA1C 6.0 (H) 05/26/2019   HGBA1C 7.2 (H) 03/25/2019   HGBA1C 7.1 (H) 03/28/2017   ESRSEDRATE 81 (H) 03/25/2019   CRP 1.4 (H) 04/11/2019   CRP 1.9 (H) 03/25/2019   LABURIC 5.6 04/13/2019   REPTSTATUS 04/12/2019 FINAL 04/11/2019   GRAMSTAIN  03/27/2019    RARE WBC PRESENT,BOTH PMN AND MONONUCLEAR NO ORGANISMS SEEN    CULT  04/11/2019    NO GROWTH Performed at [facility], [place] [address] [place], [place] [address]    [place] AEROGENES 03/27/2019     Lab Results  Component Value Date   ALBUMIN 3.0 (L) 04/12/2019   ALBUMIN 3.5 04/11/2019     ALBUMIN 3.7 03/25/2019   PREALBUMIN 17.6 (L) 03/25/2019   LABURIC 5.6 04/13/2019    No results found for: MG No results found for: VD25OH  Lab Results  Component Value Date   PREALBUMIN 17.6 (L) 03/25/2019   CBC EXTENDED Latest Ref Rng & Units 05/26/2019 04/15/2019 04/13/2019  WBC 4.0 - 10.5 K/uL 5.2 5.2 5.3  RBC 4.22 - 5.81 MIL/uL 3.62(L) 2.87(L) 2.81(L)  HGB 13.0 - 17.0 g/dL 10.3(L) 8.8(L) 8.6(L)  HCT 39 - 52 % 32.2(L) 27.5(L) 26.7(L)  PLT 150 - 400 K/uL 206 132(L) 129(L)  NEUTROABS 1.7 - 7.7 K/uL - - -  LYMPHSABS 0.7 - 4.0 K/uL - - -     There is no height or weight on file to calculate BMI.  Orders:  No orders of the defined types were placed in this encounter.  No orders of the defined types were placed in this encounter.    Procedures: No procedures performed  Clinical Data: No additional findings.  ROS:  All other systems negative, except as noted in the HPI. Review of Systems  Objective: Vital Signs: There were no vitals taken for this visit.  Specialty Comments:  No specialty comments available.  PMFS History: Patient Active Problem List   Diagnosis Date Noted  .  Wound dehiscence 05/26/2019  . Dehiscence of amputation stump ([place])   . Wound infection   . Acute osteomyelitis of left foot ([place]) 04/11/2019  . Lactic acidosis 04/11/2019  . Sepsis due to Enterobacter ([place]) 04/11/2019  . Diabetic foot ulcer ([place]) 03/25/2019  . Idiopathic chronic gout of left foot without tophus   . Myelopathy ([place]) 07/16/2013  . Ingrown nail 07/05/2013  . Paronychia 07/05/2013  . Diverticulosis 04/22/2013  . Acute osteomyelitis of humerus ([place]) 02/07/2013  . Osteomyelitis of left foot ([place]) 02/01/2013  . Joint infection ([place]) 01/22/2013  . Pyogenic arthritis of shoulder region ([place]) 01/15/2013  . Nephrolithiasis 01/23/2012  . Lumbar radiculopathy 02/28/2011  . Acute gout of right knee 04/30/2010  . Chronic pain 02/14/2010  . NASH (nonalcoholic steatohepatitis)  11/08/2008  . Obesity 10/27/2008  . Hypertriglyceridemia 10/27/2007  . Uncontrolled type 2 diabetes mellitus with hyperglycemia, with long-term current use of insulin ([place]) 03/12/2006  . Essential hypertension, benign 03/12/2006   Past Medical History:  Diagnosis Date  . Arthritis   . Back pain   . Diabetes mellitus without complication ([place])   . Diabetic foot ulcer ([place]) 03/2019  . History of kidney stones   . Hypertension     Family History  Problem Relation Age of Onset  . Heart failure Mother   . Cancer Father     Past Surgical History:  Procedure Laterality Date  . BACK SURGERY    . CARPAL TUNNEL RELEASE    . CHOLECYSTECTOMY    . FOOT SURGERY    . JOINT REPLACEMENT    . REVISION OF TRANSMETATARSAL AMPUTATION Right 05/26/2019  . SHOULDER ARTHROSCOPY    . STUMP REVISION Left 05/26/2019   Procedure: REVISION LEFT TRANSMETATARSAL AMPUTATION;  Surgeon: [name], MD;  Location: [facility] [place];  Service: Orthopedics;  Laterality: Left;  . TOTAL KNEE ARTHROPLASTY    . TRANSMETATARSAL AMPUTATION Left 03/27/2019   Procedure: TRANSMETATARSAL AMPUTATION left foot;  Surgeon: [name], MD;  Location: [place];  Service: Orthopedics;  Laterality: Left;   Social History   Occupational History  . Not on file  Tobacco Use  . Smoking status: Never Smoker  . Smokeless tobacco: Never Used  Vaping Use  . Vaping Use: Never used  Substance and Sexual Activity  . Alcohol use: No  . Drug use: No  . Sexual activity: Not on file

## 2019-09-24 ENCOUNTER — Other Ambulatory Visit: Payer: Self-pay | Admitting: Physician Assistant

## 2019-10-06 ENCOUNTER — Ambulatory Visit (INDEPENDENT_AMBULATORY_CARE_PROVIDER_SITE_OTHER): Payer: Medicaid Other | Admitting: Physician Assistant

## 2019-10-06 ENCOUNTER — Ambulatory Visit: Payer: Self-pay

## 2019-10-06 ENCOUNTER — Encounter: Payer: Self-pay | Admitting: Physician Assistant

## 2019-10-06 VITALS — Ht 74.0 in | Wt 235.0 lb

## 2019-10-06 DIAGNOSIS — M25511 Pain in right shoulder: Secondary | ICD-10-CM

## 2019-10-06 DIAGNOSIS — M25561 Pain in right knee: Secondary | ICD-10-CM

## 2019-10-06 DIAGNOSIS — S2231XA Fracture of one rib, right side, initial encounter for closed fracture: Secondary | ICD-10-CM | POA: Diagnosis not present

## 2019-10-06 MED ORDER — METHYLPREDNISOLONE ACETATE 40 MG/ML IJ SUSP
40.0000 mg | INTRAMUSCULAR | Status: AC | PRN
  Administered 2019-10-06: 40 mg via INTRA_ARTICULAR

## 2019-10-06 MED ORDER — LIDOCAINE HCL 1 % IJ SOLN
5.0000 mL | INTRAMUSCULAR | Status: AC | PRN
  Administered 2019-10-06: 5 mL

## 2019-10-06 NOTE — Progress Notes (Signed)
Office Visit Note   Patient: [name]           Date of Birth: 05-31-1955           MRN: [id number] Visit Date: 10/06/2019              Requested by: [name] [facility] [place],  [place] [address] PCP: [facility]  Chief Complaint  Patient presents with  . Left Foot - Follow-up    05/26/19 revision left foot transmet amputation       HPI: This is a pleasant gentleman who we follow for left transmetatarsal amputation in May.  With regards to this he is healed and doing well.  He comes in today because he fell onto his right side 2 days ago.  He denies loss of consciousness he denies shortness of breath.  He is complaining of right shoulder pain, right knee pain.  And right rib pain when he takes a deep breath  Assessment & Plan: Visit Diagnoses:  1. Acute pain of right knee   2. Closed fracture of one rib of right side, initial encounter   3. Acute pain of right shoulder     Plan: Patient would like to go forward with an injection into his right shoulder today.  In the meantime I would treat this symptomatically.  He is in chronic pain management.  Follow-up for reevaluation in a week.  If he had any increasing or concerning symptoms such as shortness of breath he should refer himself to the nearest emergency room  Follow-Up Instructions: No follow-ups on file.   Ortho Exam  Patient is alert, oriented, no adenopathy, well-dressed, normal affect, normal respiratory effort. He has no shortness of breath is able to carry on a conversation.  Right shoulder.  No obvious deformity.  He does have an abrasion over the [facility] joint.  Some tenderness and limited motion with forward elevation and internal rotation.  Distal pulses distal CMS is intact  Right knee.  No effusion mild soft tissue swelling and some abrasions.  No cellulitis.  Range of motion is fairly nonpainful.  Ribs he does have some tenderness with deep breath over  lower rib cage on the right.  He does have good excursion Imaging: No results found. No images are attached to the encounter.  Labs: Lab Results  Component Value Date   HGBA1C 6.0 (H) 05/26/2019   HGBA1C 7.2 (H) 03/25/2019   HGBA1C 7.1 (H) 03/28/2017   ESRSEDRATE 81 (H) 03/25/2019   CRP 1.4 (H) 04/11/2019   CRP 1.9 (H) 03/25/2019   LABURIC 5.6 04/13/2019   REPTSTATUS 04/12/2019 FINAL 04/11/2019   GRAMSTAIN  03/27/2019    RARE WBC PRESENT,BOTH PMN AND MONONUCLEAR NO ORGANISMS SEEN    CULT  04/11/2019    NO GROWTH Performed at [facility], [place] [address], [place], [place] [address]    [place] AEROGENES 03/27/2019     Lab Results  Component Value Date   ALBUMIN 3.0 (L) 04/12/2019   ALBUMIN 3.5 04/11/2019   ALBUMIN 3.7 03/25/2019   PREALBUMIN 17.6 (L) 03/25/2019   LABURIC 5.6 04/13/2019    No results found for: MG No results found for: VD25OH  Lab Results  Component Value Date   PREALBUMIN 17.6 (L) 03/25/2019   CBC EXTENDED Latest Ref Rng & Units 05/26/2019 04/15/2019 04/13/2019  WBC 4.0 - 10.5 K/uL 5.2 5.2 5.3  RBC 4.22 - 5.81 MIL/uL 3.62(L) 2.87(L) 2.81(L)  HGB  13.0 - 17.0 g/dL 10.3(L) 8.8(L) 8.6(L)  HCT 39 - 52 % 32.2(L) 27.5(L) 26.7(L)  PLT 150 - 400 K/uL 206 132(L) 129(L)  NEUTROABS 1.7 - 7.7 K/uL - - -  LYMPHSABS 0.7 - 4.0 K/uL - - -     Body mass index is 30.17 kg/m.  Orders:  Orders Placed This Encounter  Procedures  . XR Chest 2 View  . XR Knee 1-2 Views Right  . XR Shoulder Right   No orders of the defined types were placed in this encounter.    Procedures: Large Joint Inj: R subacromial bursa on 10/06/2019 11:58 AM Indications: diagnostic evaluation and pain Details: 22 G 1.5 in needle, posterior approach  Arthrogram: No  Medications: 5 mL lidocaine 1 %; 40 mg methylPREDNISolone acetate 40 MG/ML Outcome: tolerated well, no immediate complications Procedure, treatment alternatives, risks and benefits explained, specific  risks discussed. Consent was given by the patient.      Clinical Data: No additional findings.  ROS:  All other systems negative, except as noted in the HPI. Review of Systems  Objective: Vital Signs: Ht 6' 2"  (1.88 m)   Wt 235 lb (106.6 kg)   BMI 30.17 kg/m   Specialty Comments:  No specialty comments available.  PMFS History: Patient Active Problem List   Diagnosis Date Noted  . Wound dehiscence 05/26/2019  . Dehiscence of amputation stump ([place])   . Wound infection   . Acute osteomyelitis of left foot ([place]) 04/11/2019  . Lactic acidosis 04/11/2019  . Sepsis due to Enterobacter ([place]) 04/11/2019  . Diabetic foot ulcer ([place]) 03/25/2019  . Idiopathic chronic gout of left foot without tophus   . Myelopathy ([place]) 07/16/2013  . Ingrown nail 07/05/2013  . Paronychia 07/05/2013  . Diverticulosis 04/22/2013  . Acute osteomyelitis of humerus ([place]) 02/07/2013  . Osteomyelitis of left foot ([place]) 02/01/2013  . Joint infection ([place]) 01/22/2013  . Pyogenic arthritis of shoulder region ([place]) 01/15/2013  . Nephrolithiasis 01/23/2012  . Lumbar radiculopathy 02/28/2011  . Acute gout of right knee 04/30/2010  . Chronic pain 02/14/2010  . NASH (nonalcoholic steatohepatitis) 11/08/2008  . Obesity 10/27/2008  . Hypertriglyceridemia 10/27/2007  . Uncontrolled type 2 diabetes mellitus with hyperglycemia, with long-term current use of insulin ([place]) 03/12/2006  . Essential hypertension, benign 03/12/2006   Past Medical History:  Diagnosis Date  . Arthritis   . Back pain   . Diabetes mellitus without complication ([place])   . Diabetic foot ulcer ([place]) 03/2019  . History of kidney stones   . Hypertension     Family History  Problem Relation Age of Onset  . Heart failure Mother   . Cancer Father     Past Surgical History:  Procedure Laterality Date  . BACK SURGERY    . CARPAL TUNNEL RELEASE    . CHOLECYSTECTOMY    . FOOT SURGERY    . JOINT REPLACEMENT    . REVISION OF  TRANSMETATARSAL AMPUTATION Right 05/26/2019  . SHOULDER ARTHROSCOPY    . STUMP REVISION Left 05/26/2019   Procedure: REVISION LEFT TRANSMETATARSAL AMPUTATION;  Surgeon: [name], MD;  Location: [facility];  Service: Orthopedics;  Laterality: Left;  . TOTAL KNEE ARTHROPLASTY    . TRANSMETATARSAL AMPUTATION Left 03/27/2019   Procedure: TRANSMETATARSAL AMPUTATION left foot;  Surgeon: [name], MD;  Location: [facility];  Service: Orthopedics;  Laterality: Left;   Social History   Occupational History  . Not on file  Tobacco Use  . Smoking status: Never Smoker  .  Smokeless tobacco: Never Used  Vaping Use  . Vaping Use: Never used  Substance and Sexual Activity  . Alcohol use: No  . Drug use: No  . Sexual activity: Not on file

## 2019-10-10 ENCOUNTER — Other Ambulatory Visit: Payer: Self-pay | Admitting: Physician Assistant

## 2020-02-28 ENCOUNTER — Ambulatory Visit: Payer: Medicaid Other | Admitting: Orthopedic Surgery

## 2020-03-02 ENCOUNTER — Ambulatory Visit: Payer: Medicaid Other | Admitting: Orthopedic Surgery

## 2020-03-02 ENCOUNTER — Telehealth: Payer: Self-pay

## 2020-03-02 NOTE — Telephone Encounter (Signed)
I called pt because he missed his appt this morning and wanted to follow up as message states that his "toes a re black" pt says that he has to arrange for transportation and that he is not able to have 2 appt in one day and so he has an appt with the pain management clinic today and does not want to resch that appt and so he will not come into our office until Monday as he needs several days notice to arrange transportation. Advised that this is not the best choice as he is already a transmet amputation on the left. Pt says that he will not cx pain management appt

## 2020-03-06 ENCOUNTER — Ambulatory Visit: Payer: Medicaid Other | Admitting: Orthopedic Surgery

## 2020-04-12 ENCOUNTER — Other Ambulatory Visit: Payer: Self-pay

## 2020-04-12 ENCOUNTER — Emergency Department (HOSPITAL_COMMUNITY): Payer: Medicaid Other

## 2020-04-12 ENCOUNTER — Inpatient Hospital Stay (HOSPITAL_COMMUNITY)
Admission: EM | Admit: 2020-04-12 | Discharge: 2020-04-20 | DRG: 480 | Disposition: A | Discharge status: Skilled Nursing Facility | Payer: Medicaid Other | Attending: Internal Medicine | Admitting: Internal Medicine

## 2020-04-12 ENCOUNTER — Encounter (HOSPITAL_COMMUNITY): Payer: Self-pay | Admitting: Emergency Medicine

## 2020-04-12 DIAGNOSIS — E11621 Type 2 diabetes mellitus with foot ulcer: Secondary | ICD-10-CM | POA: Diagnosis present

## 2020-04-12 DIAGNOSIS — Z1619 Resistance to other specified beta lactam antibiotics: Secondary | ICD-10-CM | POA: Diagnosis present

## 2020-04-12 DIAGNOSIS — S72141A Displaced intertrochanteric fracture of right femur, initial encounter for closed fracture: Secondary | ICD-10-CM

## 2020-04-12 DIAGNOSIS — Z96659 Presence of unspecified artificial knee joint: Secondary | ICD-10-CM | POA: Diagnosis present

## 2020-04-12 DIAGNOSIS — L02611 Cutaneous abscess of right foot: Secondary | ICD-10-CM | POA: Diagnosis present

## 2020-04-12 DIAGNOSIS — F1010 Alcohol abuse, uncomplicated: Secondary | ICD-10-CM | POA: Diagnosis present

## 2020-04-12 DIAGNOSIS — W03XXXA Other fall on same level due to collision with another person, initial encounter: Secondary | ICD-10-CM | POA: Diagnosis present

## 2020-04-12 DIAGNOSIS — S72121A Displaced fracture of lesser trochanter of right femur, initial encounter for closed fracture: Principal | ICD-10-CM | POA: Diagnosis present

## 2020-04-12 DIAGNOSIS — Z7982 Long term (current) use of aspirin: Secondary | ICD-10-CM

## 2020-04-12 DIAGNOSIS — Z66 Do not resuscitate: Secondary | ICD-10-CM | POA: Diagnosis present

## 2020-04-12 DIAGNOSIS — N390 Urinary tract infection, site not specified: Secondary | ICD-10-CM | POA: Diagnosis present

## 2020-04-12 DIAGNOSIS — R451 Restlessness and agitation: Secondary | ICD-10-CM | POA: Diagnosis present

## 2020-04-12 DIAGNOSIS — I10 Essential (primary) hypertension: Secondary | ICD-10-CM | POA: Diagnosis present

## 2020-04-12 DIAGNOSIS — G8929 Other chronic pain: Secondary | ICD-10-CM | POA: Diagnosis present

## 2020-04-12 DIAGNOSIS — S72143A Displaced intertrochanteric fracture of unspecified femur, initial encounter for closed fracture: Secondary | ICD-10-CM | POA: Diagnosis present

## 2020-04-12 DIAGNOSIS — A4159 Other Gram-negative sepsis: Secondary | ICD-10-CM

## 2020-04-12 DIAGNOSIS — Z794 Long term (current) use of insulin: Secondary | ICD-10-CM

## 2020-04-12 DIAGNOSIS — L97519 Non-pressure chronic ulcer of other part of right foot with unspecified severity: Secondary | ICD-10-CM | POA: Diagnosis present

## 2020-04-12 DIAGNOSIS — Z20822 Contact with and (suspected) exposure to covid-19: Secondary | ICD-10-CM | POA: Diagnosis present

## 2020-04-12 DIAGNOSIS — Y92481 Parking lot as the place of occurrence of the external cause: Secondary | ICD-10-CM

## 2020-04-12 DIAGNOSIS — E871 Hypo-osmolality and hyponatremia: Secondary | ICD-10-CM

## 2020-04-12 DIAGNOSIS — T1490XA Injury, unspecified, initial encounter: Secondary | ICD-10-CM

## 2020-04-12 DIAGNOSIS — F419 Anxiety disorder, unspecified: Secondary | ICD-10-CM | POA: Diagnosis present

## 2020-04-12 DIAGNOSIS — E1165 Type 2 diabetes mellitus with hyperglycemia: Secondary | ICD-10-CM

## 2020-04-12 DIAGNOSIS — D649 Anemia, unspecified: Secondary | ICD-10-CM | POA: Diagnosis present

## 2020-04-12 DIAGNOSIS — G9341 Metabolic encephalopathy: Secondary | ICD-10-CM | POA: Diagnosis present

## 2020-04-12 DIAGNOSIS — Z79899 Other long term (current) drug therapy: Secondary | ICD-10-CM

## 2020-04-12 LAB — CBC WITH DIFFERENTIAL/PLATELET
Abs Immature Granulocytes: 0.1 10*3/uL — ABNORMAL HIGH (ref 0.00–0.07)
Basophils Absolute: 0 10*3/uL (ref 0.0–0.1)
Basophils Relative: 0 %
Eosinophils Absolute: 0 10*3/uL (ref 0.0–0.5)
Eosinophils Relative: 0 %
HCT: 27.9 % — ABNORMAL LOW (ref 39.0–52.0)
Hemoglobin: 9.6 g/dL — ABNORMAL LOW (ref 13.0–17.0)
Immature Granulocytes: 1 %
Lymphocytes Relative: 10 %
Lymphs Abs: 1.1 10*3/uL (ref 0.7–4.0)
MCH: 31.5 pg (ref 26.0–34.0)
MCHC: 34.4 g/dL (ref 30.0–36.0)
MCV: 91.5 fL (ref 80.0–100.0)
Monocytes Absolute: 0.5 10*3/uL (ref 0.1–1.0)
Monocytes Relative: 5 %
Neutro Abs: 8.8 10*3/uL — ABNORMAL HIGH (ref 1.7–7.7)
Neutrophils Relative %: 84 %
Platelets: 235 10*3/uL (ref 150–400)
RBC: 3.05 MIL/uL — ABNORMAL LOW (ref 4.22–5.81)
RDW: 14.4 % (ref 11.5–15.5)
WBC: 10.5 10*3/uL (ref 4.0–10.5)
nRBC: 0 % (ref 0.0–0.2)

## 2020-04-12 NOTE — ED Notes (Signed)
Helped pt off of the bed pain, pt had several round hard stools, cleaned pt, changed bedding.

## 2020-04-12 NOTE — ED Provider Notes (Signed)
[facility] EMERGENCY DEPARTMENT Provider Note   CSN: [id number] Arrival date & time: 04/12/20  2113     History Chief Complaint  Patient presents with  . Fall    [name] is a 65 y.o. male.  HPI   Patient presents ED for evaluation of leg pain after a fall.  Patient states several days ago he was knocked over while standing outside.  He ended up falling injuring his right leg.  Patient states he has a history of diabetes and has a partial amputation so he has a wheelchair at home.  He has had to use that more frequently.  He is having pain mostly in the right hip and thigh region.  He is able to stand and walk but it hurts.  He denies any other complaints of headache.  No vomiting or diarrhea.  No chest pain or shortness of breath.  Past Medical History:  Diagnosis Date  . Arthritis   . Back pain   . Diabetes mellitus without complication ([place])   . Diabetic foot ulcer ([place]) 03/2019  . History of kidney stones   . Hypertension     Patient Active Problem List   Diagnosis Date Noted  . Wound dehiscence 05/26/2019  . Dehiscence of amputation stump ([place])   . Wound infection   . Acute osteomyelitis of left foot ([place]) 04/11/2019  . Lactic acidosis 04/11/2019  . Sepsis due to Enterobacter ([place]) 04/11/2019  . Diabetic foot ulcer ([place]) 03/25/2019  . Idiopathic chronic gout of left foot without tophus   . Myelopathy ([place]) 07/16/2013  . Ingrown nail 07/05/2013  . Paronychia 07/05/2013  . Diverticulosis 04/22/2013  . Acute osteomyelitis of humerus ([place]) 02/07/2013  . Osteomyelitis of left foot ([place]) 02/01/2013  . Joint infection ([place]) 01/22/2013  . Pyogenic arthritis of shoulder region ([place]) 01/15/2013  . Nephrolithiasis 01/23/2012  . Lumbar radiculopathy 02/28/2011  . Acute gout of right knee 04/30/2010  . Chronic pain 02/14/2010  . NASH (nonalcoholic steatohepatitis) 11/08/2008  . Obesity 10/27/2008  . Hypertriglyceridemia 10/27/2007  . Uncontrolled type 2 diabetes  mellitus with hyperglycemia, with long-term current use of insulin ([place]) 03/12/2006  . Essential hypertension, benign 03/12/2006    Past Surgical History:  Procedure Laterality Date  . BACK SURGERY    . CARPAL TUNNEL RELEASE    . CHOLECYSTECTOMY    . FOOT SURGERY    . JOINT REPLACEMENT    . REVISION OF TRANSMETATARSAL AMPUTATION Right 05/26/2019  . SHOULDER ARTHROSCOPY    . STUMP REVISION Left 05/26/2019   Procedure: REVISION LEFT TRANSMETATARSAL AMPUTATION;  Surgeon: [name], MD;  Location: [facility];  Service: Orthopedics;  Laterality: Left;  . TOTAL KNEE ARTHROPLASTY    . TRANSMETATARSAL AMPUTATION Left 03/27/2019   Procedure: TRANSMETATARSAL AMPUTATION left foot;  Surgeon: [name], MD;  Location: [facility];  Service: Orthopedics;  Laterality: Left;       Family History  Problem Relation Age of Onset  . Heart failure Mother   . Cancer Father     Social History   Tobacco Use  . Smoking status: Never Smoker  . Smokeless tobacco: Never Used  Vaping Use  . Vaping Use: Never used  Substance Use Topics  . Alcohol use: No  . Drug use: No    Home Medications Prior to Admission medications   Medication Sig Start Date End Date Taking? Authorizing Provider  allopurinol (ZYLOPRIM) 300 MG tablet Take 300 mg by mouth daily. 02/19/19   [provider]  amitriptyline (ELAVIL) 25 MG tablet Take 25 mg by mouth at bedtime. 03/11/19   [provider]  amLODipine (NORVASC) 10 MG tablet Take 5 mg by mouth daily.  02/08/16   [provider]  aspirin EC 81 MG tablet Take 81 mg by mouth every other day.     [provider]  furosemide (LASIX) 20 MG tablet Take 20 mg by mouth daily. 01/06/19   [provider]  gabapentin (NEURONTIN) 300 MG capsule Take 300 mg by mouth 3 (three) times daily. 03/16/19   [provider]  LEVEMIR FLEXTOUCH 100 UNIT/ML Pen Inject 70 Units into the skin 2 (two) times daily.  10/08/13   [provider]   lisinopril (PRINIVIL,ZESTRIL) 40 MG tablet Take 40 mg by mouth daily. 03/05/16   [provider]  metoprolol succinate (TOPROL-XL) 100 MG 24 hr tablet Take 100 mg by mouth daily. 02/04/19   [provider]  MITIGARE 0.6 MG CAPS Take 2 capsules by mouth daily. 05/10/19   [provider]  nitroGLYCERIN (NITRODUR - DOSED IN MG/24 HR) 0.2 mg/hr patch PLACE 1 PATCH (0.2 MG TOTAL) ONTO THE SKIN DAILY. APPLY NEW PATCH REMOVE OLD PATCH DAILY ON FOOT . PLACE IN DIFFERENT PLACE ON TOP OF FOOT 10/11/19   Persons, Bevely Palmer, PA  Oxycodone HCl 10 MG TABS Take 10 mg by mouth every 6 (six) hours. 02/19/19   [provider]  pentoxifylline (TRENTAL) 400 MG CR tablet Take 1 tablet (400 mg total) by mouth 3 (three) times daily with meals. 05/13/19   Persons, Bevely Palmer, PA  tizanidine (ZANAFLEX) 2 MG capsule Take 2 mg by mouth 3 (three) times daily. 05/19/19   [provider]    Allergies    Other, Pravastatin, Prednisone, and Toradol [ketorolac tromethamine]  Review of Systems   Review of Systems  All other systems reviewed and are negative.   Physical Exam Updated Vital Signs BP (!) 143/100   Pulse (!) 118   Resp 20   Wt 107 kg   SpO2 100%   BMI 30.29 kg/m   Physical Exam Vitals and nursing note reviewed.  Constitutional:      General: He is not in acute distress.    Appearance: He is well-developed.  HENT:     Head: Normocephalic and atraumatic.     Right Ear: External ear normal.     Left Ear: External ear normal.  Eyes:     General: No scleral icterus.       Right eye: No discharge.        Left eye: No discharge.     Conjunctiva/sclera: Conjunctivae normal.  Neck:     Trachea: No tracheal deviation.  Cardiovascular:     Rate and Rhythm: Normal rate and regular rhythm.  Pulmonary:     Effort: Pulmonary effort is normal. No respiratory distress.     Breath sounds: Normal breath sounds. No stridor. No wheezing or rales.  Abdominal:     General:  Bowel sounds are normal. There is no distension.     Palpations: Abdomen is soft.     Tenderness: There is no abdominal tenderness. There is no guarding or rebound.  Musculoskeletal:        General: No tenderness.     Cervical back: Neck supple.     Comments: Status post partial amputation left foot, tenderness palpation right thigh and hip,  Skin:    General: Skin is warm and dry.     Findings: No  rash.  Neurological:     Mental Status: He is alert.     Cranial Nerves: No cranial nerve deficit (no facial droop, extraocular movements intact, no slurred speech).     Sensory: No sensory deficit.     Motor: No abnormal muscle tone or seizure activity.     Coordination: Coordination normal.     ED Results / Procedures / Treatments   Labs (all labs ordered are listed, but only abnormal results are displayed) Labs Reviewed  CBC WITH DIFFERENTIAL/PLATELET - Abnormal; Notable for the following components:      Result Value   RBC 3.05 (*)    Hemoglobin 9.6 (*)    HCT 27.9 (*)    Neutro Abs 8.8 (*)    Abs Immature Granulocytes 0.10 (*)    All other components within normal limits  BASIC METABOLIC PANEL    EKG None  Radiology No results found.  Procedures Procedures   Medications Ordered in ED Medications - No data to display  ED Course  I have reviewed the triage vital signs and the nursing notes.  Pertinent labs & imaging results that were available during my care of the patient were reviewed by me and considered in my medical decision making (see chart for details).  Clinical Course as of 04/12/20 2354  Wed Apr 12, 2020  2353 Labs reviewed.  Hemoglobin is stable. [JK]  8889 Metabolic pending.  X-rays are pending. [JK]    Clinical Course User Index [JK] Dorie Rank, MD   MDM Rules/Calculators/A&P                          Patient presented to the ED for evaluation of pain in his right leg after a fall.  Patient does have ambulatory difficulty at baseline but he does  have a wheelchair available.  X-rays have been ordered.  Metabolic panel is also pending.  If x-rays are negative and laboratory test unremarkable I feel patient likely stable for discharge.  Care turned over to Dr. Roxanne Mins. Final Clinical Impression(s) / ED Diagnoses pending   Dorie Rank, MD 04/12/20 2354

## 2020-04-12 NOTE — ED Triage Notes (Signed)
Pt has been having multiple falls. Pt c/o right leg pain.

## 2020-04-13 ENCOUNTER — Emergency Department (HOSPITAL_COMMUNITY): Payer: Medicaid Other

## 2020-04-13 ENCOUNTER — Other Ambulatory Visit: Payer: Self-pay | Admitting: Physician Assistant

## 2020-04-13 DIAGNOSIS — I10 Essential (primary) hypertension: Secondary | ICD-10-CM | POA: Diagnosis present

## 2020-04-13 DIAGNOSIS — D649 Anemia, unspecified: Secondary | ICD-10-CM | POA: Diagnosis present

## 2020-04-13 DIAGNOSIS — F419 Anxiety disorder, unspecified: Secondary | ICD-10-CM | POA: Diagnosis present

## 2020-04-13 DIAGNOSIS — Z7982 Long term (current) use of aspirin: Secondary | ICD-10-CM | POA: Diagnosis not present

## 2020-04-13 DIAGNOSIS — Z1619 Resistance to other specified beta lactam antibiotics: Secondary | ICD-10-CM | POA: Diagnosis present

## 2020-04-13 DIAGNOSIS — R410 Disorientation, unspecified: Secondary | ICD-10-CM | POA: Diagnosis not present

## 2020-04-13 DIAGNOSIS — Z20822 Contact with and (suspected) exposure to covid-19: Secondary | ICD-10-CM | POA: Diagnosis present

## 2020-04-13 DIAGNOSIS — Z794 Long term (current) use of insulin: Secondary | ICD-10-CM | POA: Diagnosis not present

## 2020-04-13 DIAGNOSIS — S72141A Displaced intertrochanteric fracture of right femur, initial encounter for closed fracture: Secondary | ICD-10-CM | POA: Diagnosis not present

## 2020-04-13 DIAGNOSIS — E11621 Type 2 diabetes mellitus with foot ulcer: Secondary | ICD-10-CM | POA: Diagnosis present

## 2020-04-13 DIAGNOSIS — E871 Hypo-osmolality and hyponatremia: Secondary | ICD-10-CM | POA: Diagnosis present

## 2020-04-13 DIAGNOSIS — Z96659 Presence of unspecified artificial knee joint: Secondary | ICD-10-CM | POA: Diagnosis present

## 2020-04-13 DIAGNOSIS — E1165 Type 2 diabetes mellitus with hyperglycemia: Secondary | ICD-10-CM | POA: Diagnosis present

## 2020-04-13 DIAGNOSIS — N390 Urinary tract infection, site not specified: Secondary | ICD-10-CM | POA: Diagnosis present

## 2020-04-13 DIAGNOSIS — G9341 Metabolic encephalopathy: Secondary | ICD-10-CM | POA: Diagnosis present

## 2020-04-13 DIAGNOSIS — Z66 Do not resuscitate: Secondary | ICD-10-CM | POA: Diagnosis present

## 2020-04-13 DIAGNOSIS — W03XXXA Other fall on same level due to collision with another person, initial encounter: Secondary | ICD-10-CM | POA: Diagnosis present

## 2020-04-13 DIAGNOSIS — S72143A Displaced intertrochanteric fracture of unspecified femur, initial encounter for closed fracture: Secondary | ICD-10-CM | POA: Diagnosis present

## 2020-04-13 DIAGNOSIS — R451 Restlessness and agitation: Secondary | ICD-10-CM | POA: Diagnosis present

## 2020-04-13 DIAGNOSIS — Z79899 Other long term (current) drug therapy: Secondary | ICD-10-CM | POA: Diagnosis not present

## 2020-04-13 DIAGNOSIS — S72121A Displaced fracture of lesser trochanter of right femur, initial encounter for closed fracture: Secondary | ICD-10-CM | POA: Diagnosis present

## 2020-04-13 DIAGNOSIS — Y92481 Parking lot as the place of occurrence of the external cause: Secondary | ICD-10-CM | POA: Diagnosis not present

## 2020-04-13 DIAGNOSIS — L02611 Cutaneous abscess of right foot: Secondary | ICD-10-CM | POA: Diagnosis present

## 2020-04-13 DIAGNOSIS — L97519 Non-pressure chronic ulcer of other part of right foot with unspecified severity: Secondary | ICD-10-CM | POA: Diagnosis present

## 2020-04-13 DIAGNOSIS — F1010 Alcohol abuse, uncomplicated: Secondary | ICD-10-CM | POA: Diagnosis present

## 2020-04-13 DIAGNOSIS — G8929 Other chronic pain: Secondary | ICD-10-CM | POA: Diagnosis present

## 2020-04-13 LAB — COMPREHENSIVE METABOLIC PANEL
ALT: 17 U/L (ref 0–44)
ALT: 16 U/L (ref 0–44)
AST: 21 U/L (ref 15–41)
AST: 19 U/L (ref 15–41)
Albumin: 3 g/dL — ABNORMAL LOW (ref 3.5–5.0)
Albumin: 2.9 g/dL — ABNORMAL LOW (ref 3.5–5.0)
Alkaline Phosphatase: 62 U/L (ref 38–126)
Alkaline Phosphatase: 61 U/L (ref 38–126)
Anion gap: 10 (ref 5–15)
Anion gap: 8 (ref 5–15)
BUN: 21 mg/dL (ref 8–23)
BUN: 22 mg/dL (ref 8–23)
CO2: 24 mmol/L (ref 22–32)
CO2: 25 mmol/L (ref 22–32)
Calcium: 8.6 mg/dL — ABNORMAL LOW (ref 8.9–10.3)
Calcium: 8.7 mg/dL — ABNORMAL LOW (ref 8.9–10.3)
Chloride: 100 mmol/L (ref 98–111)
Chloride: 102 mmol/L (ref 98–111)
Creatinine, Ser: 0.97 mg/dL (ref 0.61–1.24)
Creatinine, Ser: 0.97 mg/dL (ref 0.61–1.24)
GFR, Estimated: >60 mL/min (ref >60)
GFR, Estimated: >60 mL/min (ref >60)
Glucose, Bld: 223 mg/dL — ABNORMAL HIGH (ref 70–99)
Glucose, Bld: 208 mg/dL — ABNORMAL HIGH (ref 70–99)
Potassium: 3.7 mmol/L (ref 3.5–5.1)
Potassium: 3.7 mmol/L (ref 3.5–5.1)
Sodium: 134 mmol/L — ABNORMAL LOW (ref 135–145)
Sodium: 135 mmol/L (ref 135–145)
Total Bilirubin: 1.4 mg/dL — ABNORMAL HIGH (ref 0.3–1.2)
Total Bilirubin: 1 mg/dL (ref 0.3–1.2)
Total Protein: 7 g/dL (ref 6.5–8.1)
Total Protein: 7 g/dL (ref 6.5–8.1)

## 2020-04-13 LAB — CBC WITH DIFFERENTIAL/PLATELET
Abs Immature Granulocytes: 0.06 10*3/uL (ref 0.00–0.07)
Basophils Absolute: 0 10*3/uL (ref 0.0–0.1)
Basophils Relative: 0 %
Eosinophils Absolute: 0 10*3/uL (ref 0.0–0.5)
Eosinophils Relative: 0 %
HCT: 25.8 % — ABNORMAL LOW (ref 39.0–52.0)
Hemoglobin: 8.9 g/dL — ABNORMAL LOW (ref 13.0–17.0)
Immature Granulocytes: 1 %
Lymphocytes Relative: 16 %
Lymphs Abs: 1.3 10*3/uL (ref 0.7–4.0)
MCH: 31.3 pg (ref 26.0–34.0)
MCHC: 34.5 g/dL (ref 30.0–36.0)
MCV: 90.8 fL (ref 80.0–100.0)
Monocytes Absolute: 0.5 10*3/uL (ref 0.1–1.0)
Monocytes Relative: 6 %
Neutro Abs: 6.6 10*3/uL (ref 1.7–7.7)
Neutrophils Relative %: 77 %
Platelets: 211 10*3/uL (ref 150–400)
RBC: 2.84 MIL/uL — ABNORMAL LOW (ref 4.22–5.81)
RDW: 14.2 % (ref 11.5–15.5)
WBC: 8.5 10*3/uL (ref 4.0–10.5)
nRBC: 0 % (ref 0.0–0.2)

## 2020-04-13 LAB — PROTIME-INR
INR: 1.1 (ref 0.8–1.2)
Prothrombin Time: 13.9 seconds (ref 11.4–15.2)

## 2020-04-13 LAB — URINALYSIS, ROUTINE W REFLEX MICROSCOPIC
Bilirubin Urine: NEGATIVE
Glucose, UA: 150 mg/dL — AB
Ketones, ur: 80 mg/dL — AB
Nitrite: NEGATIVE
Protein, ur: NEGATIVE mg/dL
Specific Gravity, Urine: 1.016 (ref 1.005–1.030)
WBC, UA: >50 WBC/hpf — ABNORMAL HIGH (ref 0–5)
pH: 5 (ref 5.0–8.0)

## 2020-04-13 LAB — BASIC METABOLIC PANEL
Anion gap: 12 (ref 5–15)
BUN: 21 mg/dL (ref 8–23)
CO2: 23 mmol/L (ref 22–32)
Calcium: 8.7 mg/dL — ABNORMAL LOW (ref 8.9–10.3)
Chloride: 98 mmol/L (ref 98–111)
Creatinine, Ser: 1.1 mg/dL (ref 0.61–1.24)
GFR, Estimated: >60 mL/min (ref >60)
Glucose, Bld: 236 mg/dL — ABNORMAL HIGH (ref 70–99)
Potassium: 4.1 mmol/L (ref 3.5–5.1)
Sodium: 133 mmol/L — ABNORMAL LOW (ref 135–145)

## 2020-04-13 LAB — HEMOGLOBIN A1C
Hgb A1c MFr Bld: 7.2 % — ABNORMAL HIGH (ref 4.8–5.6)
Mean Plasma Glucose: 159.94 mg/dL

## 2020-04-13 LAB — TYPE AND SCREEN
ABO/RH(D): O POS
Antibody Screen: NEGATIVE

## 2020-04-13 LAB — MAGNESIUM
Magnesium: 1.8 mg/dL (ref 1.7–2.4)
Magnesium: 1.9 mg/dL (ref 1.7–2.4)

## 2020-04-13 LAB — HIV ANTIBODY (ROUTINE TESTING W REFLEX): HIV Screen 4th Generation wRfx: NONREACTIVE

## 2020-04-13 LAB — SARS CORONAVIRUS 2 (TAT 6-24 HRS): SARS Coronavirus 2: NEGATIVE

## 2020-04-13 LAB — GLUCOSE, CAPILLARY
Glucose-Capillary: 171 mg/dL — ABNORMAL HIGH (ref 70–99)
Glucose-Capillary: 218 mg/dL — ABNORMAL HIGH (ref 70–99)

## 2020-04-13 LAB — CBG MONITORING, ED
Glucose-Capillary: 205 mg/dL — ABNORMAL HIGH (ref 70–99)
Glucose-Capillary: 164 mg/dL — ABNORMAL HIGH (ref 70–99)

## 2020-04-13 LAB — PHOSPHORUS: Phosphorus: 3.2 mg/dL (ref 2.5–4.6)

## 2020-04-13 LAB — ABO/RH: ABO/RH(D): O POS

## 2020-04-13 MED ORDER — CEFAZOLIN SODIUM-DEXTROSE 2-4 GM/100ML-% IV SOLN
2.0000 g | INTRAVENOUS | Status: AC
  Administered 2020-04-14: 2 g via INTRAVENOUS
  Filled 2020-04-13 (×2): qty 100

## 2020-04-13 MED ORDER — ACETAMINOPHEN 650 MG RE SUPP: 650.0000 mg | Freq: Four times a day (QID) | RECTAL | Status: DC | PRN

## 2020-04-13 MED ORDER — LISINOPRIL 20 MG PO TABS
40.0000 mg | ORAL_TABLET | Freq: Every day | ORAL | Status: DC
  Administered 2020-04-13 – 2020-04-20 (×8): 40 mg via ORAL
  Filled 2020-04-13 (×2): qty 2
  Filled 2020-04-13: qty 4
  Filled 2020-04-13 (×5): qty 2

## 2020-04-13 MED ORDER — HALOPERIDOL LACTATE 5 MG/ML IJ SOLN
5.0000 mg | Freq: Once | INTRAMUSCULAR | Status: AC
  Administered 2020-04-13: 5 mg via INTRAVENOUS
  Filled 2020-04-13: qty 1

## 2020-04-13 MED ORDER — ACETAMINOPHEN 325 MG PO TABS
650.0000 mg | ORAL_TABLET | Freq: Four times a day (QID) | ORAL | Status: DC | PRN
  Administered 2020-04-16 – 2020-04-19 (×2): 650 mg via ORAL
  Filled 2020-04-13 (×3): qty 2

## 2020-04-13 MED ORDER — LORAZEPAM 2 MG/ML IJ SOLN: 1.0000 mg | INTRAMUSCULAR | Status: DC | PRN

## 2020-04-13 MED ORDER — HALOPERIDOL LACTATE 5 MG/ML IJ SOLN
2.0000 mg | Freq: Four times a day (QID) | INTRAMUSCULAR | Status: DC | PRN
  Administered 2020-04-14 – 2020-04-16 (×2): 2 mg via INTRAVENOUS
  Filled 2020-04-13 (×2): qty 1

## 2020-04-13 MED ORDER — AMLODIPINE BESYLATE 5 MG PO TABS
5.0000 mg | ORAL_TABLET | Freq: Every day | ORAL | Status: DC
  Administered 2020-04-13 – 2020-04-20 (×8): 5 mg via ORAL
  Filled 2020-04-13 (×8): qty 1

## 2020-04-13 MED ORDER — INSULIN ASPART 100 UNIT/ML ~~LOC~~ SOLN
0.0000 [IU] | Freq: Three times a day (TID) | SUBCUTANEOUS | Status: DC
  Administered 2020-04-13: 4 [IU] via SUBCUTANEOUS
  Administered 2020-04-13: 7 [IU] via SUBCUTANEOUS
  Administered 2020-04-13 – 2020-04-14 (×2): 4 [IU] via SUBCUTANEOUS
  Administered 2020-04-14: 11 [IU] via SUBCUTANEOUS
  Administered 2020-04-14: 7 [IU] via SUBCUTANEOUS
  Administered 2020-04-15: 11 [IU] via SUBCUTANEOUS
  Administered 2020-04-15: 4 [IU] via SUBCUTANEOUS
  Administered 2020-04-15 – 2020-04-16 (×2): 7 [IU] via SUBCUTANEOUS
  Administered 2020-04-16: 3 [IU] via SUBCUTANEOUS
  Administered 2020-04-16: 7 [IU] via SUBCUTANEOUS
  Administered 2020-04-17: 4 [IU] via SUBCUTANEOUS
  Administered 2020-04-17: 3 [IU] via SUBCUTANEOUS
  Administered 2020-04-17: 4 [IU] via SUBCUTANEOUS
  Administered 2020-04-18: 11 [IU] via SUBCUTANEOUS
  Administered 2020-04-18: 4 [IU] via SUBCUTANEOUS
  Administered 2020-04-18 – 2020-04-19 (×4): 7 [IU] via SUBCUTANEOUS
  Administered 2020-04-20: 4 [IU] via SUBCUTANEOUS
  Administered 2020-04-20: 3 [IU] via SUBCUTANEOUS
  Administered 2020-04-20: 4 [IU] via SUBCUTANEOUS
  Filled 2020-04-13: qty 1

## 2020-04-13 MED ORDER — LORAZEPAM 1 MG PO TABS: 1.0000 mg | ORAL_TABLET | ORAL | Status: DC | PRN

## 2020-04-13 MED ORDER — ADULT MULTIVITAMIN W/MINERALS CH
1.0000 | ORAL_TABLET | Freq: Every day | ORAL | Status: DC
  Administered 2020-04-13 – 2020-04-20 (×8): 1 via ORAL
  Filled 2020-04-13 (×7): qty 1

## 2020-04-13 MED ORDER — LORAZEPAM 1 MG PO TABS
0.0000 mg | ORAL_TABLET | Freq: Four times a day (QID) | ORAL | Status: DC
  Administered 2020-04-13: 2 mg via ORAL
  Filled 2020-04-13: qty 2

## 2020-04-13 MED ORDER — MORPHINE SULFATE (PF) 4 MG/ML IV SOLN
4.0000 mg | INTRAVENOUS | Status: AC | PRN
Start: 2020-04-13 — End: 2020-04-13
  Administered 2020-04-13 (×2): 4 mg via INTRAVENOUS
  Filled 2020-04-13 (×2): qty 1

## 2020-04-13 MED ORDER — HEPARIN SODIUM (PORCINE) 5000 UNIT/ML IJ SOLN
5000.0000 [IU] | Freq: Three times a day (TID) | INTRAMUSCULAR | Status: DC
  Administered 2020-04-13 – 2020-04-20 (×22): 5000 [IU] via SUBCUTANEOUS
  Filled 2020-04-13 (×22): qty 1

## 2020-04-13 MED ORDER — LORAZEPAM 1 MG PO TABS: 0.0000 mg | ORAL_TABLET | Freq: Two times a day (BID) | ORAL | Status: DC

## 2020-04-13 MED ORDER — AMITRIPTYLINE HCL 50 MG PO TABS
25.0000 mg | ORAL_TABLET | Freq: Every day | ORAL | Status: DC
  Administered 2020-04-13 – 2020-04-19 (×7): 25 mg via ORAL
  Filled 2020-04-13 (×7): qty 1

## 2020-04-13 MED ORDER — INSULIN DETEMIR 100 UNIT/ML ~~LOC~~ SOLN
50.0000 [IU] | Freq: Every day | SUBCUTANEOUS | Status: DC
  Administered 2020-04-13 – 2020-04-19 (×7): 50 [IU] via SUBCUTANEOUS
  Filled 2020-04-13 (×9): qty 0.5

## 2020-04-13 MED ORDER — THIAMINE HCL 100 MG/ML IJ SOLN
100.0000 mg | Freq: Every day | INTRAMUSCULAR | Status: DC
  Filled 2020-04-13 (×3): qty 2

## 2020-04-13 MED ORDER — OXYCODONE HCL 5 MG PO TABS
10.0000 mg | ORAL_TABLET | Freq: Four times a day (QID) | ORAL | Status: DC
  Administered 2020-04-13 – 2020-04-14 (×3): 10 mg via ORAL
  Filled 2020-04-13 (×3): qty 2

## 2020-04-13 MED ORDER — PENTOXIFYLLINE ER 400 MG PO TBCR
400.0000 mg | EXTENDED_RELEASE_TABLET | Freq: Three times a day (TID) | ORAL | Status: DC
  Administered 2020-04-14 – 2020-04-20 (×20): 400 mg via ORAL
  Filled 2020-04-13 (×26): qty 1

## 2020-04-13 MED ORDER — THIAMINE HCL 100 MG PO TABS
100.0000 mg | ORAL_TABLET | Freq: Every day | ORAL | Status: DC
  Administered 2020-04-13 – 2020-04-20 (×8): 100 mg via ORAL
  Filled 2020-04-13 (×8): qty 1

## 2020-04-13 MED ORDER — HYDROMORPHONE HCL 1 MG/ML IJ SOLN
1.0000 mg | INTRAMUSCULAR | Status: DC | PRN
Start: 2020-04-13 — End: 2020-04-21
  Administered 2020-04-13 – 2020-04-19 (×7): 1 mg via INTRAVENOUS
  Filled 2020-04-13 (×9): qty 1

## 2020-04-13 MED ORDER — ASPIRIN EC 81 MG PO TBEC
81.0000 mg | DELAYED_RELEASE_TABLET | ORAL | Status: DC
  Administered 2020-04-13 – 2020-04-19 (×4): 81 mg via ORAL
  Filled 2020-04-13 (×7): qty 1

## 2020-04-13 MED ORDER — METOPROLOL SUCCINATE ER 50 MG PO TB24
100.0000 mg | ORAL_TABLET | Freq: Every day | ORAL | Status: DC
  Administered 2020-04-13 – 2020-04-14 (×2): 100 mg via ORAL
  Filled 2020-04-13 (×2): qty 2

## 2020-04-13 MED ORDER — POLYETHYLENE GLYCOL 3350 17 G PO PACK: 17.0000 g | PACK | Freq: Every day | ORAL | Status: DC | PRN

## 2020-04-13 MED ORDER — FOLIC ACID 1 MG PO TABS
1.0000 mg | ORAL_TABLET | Freq: Every day | ORAL | Status: DC
  Administered 2020-04-13 – 2020-04-20 (×8): 1 mg via ORAL
  Filled 2020-04-13 (×8): qty 1

## 2020-04-13 MED ORDER — ONDANSETRON HCL 4 MG/2ML IJ SOLN: 4.0000 mg | Freq: Four times a day (QID) | INTRAMUSCULAR | Status: DC | PRN

## 2020-04-13 MED ORDER — INSULIN ASPART 100 UNIT/ML ~~LOC~~ SOLN
0.0000 [IU] | Freq: Every day | SUBCUTANEOUS | Status: DC
  Administered 2020-04-13 – 2020-04-16 (×3): 2 [IU] via SUBCUTANEOUS
  Administered 2020-04-17: 3 [IU] via SUBCUTANEOUS
  Administered 2020-04-19: 2 [IU] via SUBCUTANEOUS

## 2020-04-13 MED ORDER — ONDANSETRON HCL 4 MG PO TABS: 4.0000 mg | ORAL_TABLET | Freq: Four times a day (QID) | ORAL | Status: DC | PRN

## 2020-04-13 MED ORDER — SODIUM CHLORIDE 0.9 % IV SOLN
1.0000 g | INTRAVENOUS | Status: DC
  Administered 2020-04-13 – 2020-04-16 (×4): 1 g via INTRAVENOUS
  Filled 2020-04-13 (×4): qty 10

## 2020-04-13 NOTE — Progress Notes (Signed)
Patient arrived from [place] to room [phone]. Patient oriented to room and call bell in reach.
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
## 2020-04-13 NOTE — ED Provider Notes (Signed)
Care assumed from Dr. Tomi Bamberger, patient with right leg pain following fall pending x-rays.  X-ray shows intertrochanteric right hip fracture.  Case is discussed with Dr. Marcelino Scot orthopedics who request patient be transferred to [facility].  Case discussed with Dr. Clearence Ped of [facility], who agrees to admit the patient.  Results for orders placed or performed during the hospital encounter of 04/12/20  CBC with Differential/Platelet  Result Value Ref Range   WBC 10.5 4.0 - 10.5 K/uL   RBC 3.05 (L) 4.22 - 5.81 MIL/uL   Hemoglobin 9.6 (L) 13.0 - 17.0 g/dL   HCT 27.9 (L) 39.0 - 52.0 %   MCV 91.5 80.0 - 100.0 fL   MCH 31.5 26.0 - 34.0 pg   MCHC 34.4 30.0 - 36.0 g/dL   RDW 14.4 11.5 - 15.5 %   Platelets 235 150 - 400 K/uL   nRBC 0.0 0.0 - 0.2 %   Neutrophils Relative % 84 %   Neutro Abs 8.8 (H) 1.7 - 7.7 K/uL   Lymphocytes Relative 10 %   Lymphs Abs 1.1 0.7 - 4.0 K/uL   Monocytes Relative 5 %   Monocytes Absolute 0.5 0.1 - 1.0 K/uL   Eosinophils Relative 0 %   Eosinophils Absolute 0.0 0.0 - 0.5 K/uL   Basophils Relative 0 %   Basophils Absolute 0.0 0.0 - 0.1 K/uL   Immature Granulocytes 1 %   Abs Immature Granulocytes 0.10 (H) 0.00 - 0.07 K/uL  Basic metabolic panel  Result Value Ref Range   Sodium 133 (L) 135 - 145 mmol/L   Potassium 4.1 3.5 - 5.1 mmol/L   Chloride 98 98 - 111 mmol/L   CO2 23 22 - 32 mmol/L   Glucose, Bld 236 (H) 70 - 99 mg/dL   BUN 21 8 - 23 mg/dL   Creatinine, Ser 1.10 0.61 - 1.24 mg/dL   Calcium 8.7 (L) 8.9 - 10.3 mg/dL   GFR, Estimated >60 >60 mL/min   Anion gap 12 5 - 15  Urinalysis, Routine w reflex microscopic Urine, Clean Catch  Result Value Ref Range   Color, Urine YELLOW YELLOW   APPearance CLOUDY (A) CLEAR   Specific Gravity, Urine 1.016 1.005 - 1.030   pH 5.0 5.0 - 8.0   Glucose, UA 150 (A) NEGATIVE mg/dL   Hgb urine dipstick MODERATE (A) NEGATIVE   Bilirubin Urine NEGATIVE NEGATIVE   Ketones, ur 80 (A) NEGATIVE mg/dL    Protein, ur NEGATIVE NEGATIVE mg/dL   Nitrite NEGATIVE NEGATIVE   Leukocytes,Ua LARGE (A) NEGATIVE   RBC / HPF 6-10 0 - 5 RBC/hpf   WBC, UA >50 (H) 0 - 5 WBC/hpf   Bacteria, UA MANY (A) NONE SEEN   Squamous Epithelial / LPF 0-5 0 - 5   WBC Clumps PRESENT    Mucus PRESENT   Protime-INR  Result Value Ref Range   Prothrombin Time 13.9 11.4 - 15.2 seconds   INR 1.1 0.8 - 1.2  Type and screen [facility]  Result Value Ref Range   ABO/RH(D) O POS    Antibody Screen NEG    Sample Expiration      04/16/2020,2359 Performed at [facility], [address], [place], [place] [address]    DG Tibia/Fibula Right  Result Date: 04/13/2020 CLINICAL DATA:  Fall, pain EXAM: RIGHT TIBIA AND FIBULA - 2 VIEW COMPARISON:  None. FINDINGS: Two view radiograph right tibia and fibula demonstrates normal alignment. No fracture or dislocation. Tricompartmental degenerative arthritis is seen within  the right knee, most severe within the patellofemoral compartment. Vascular calcifications are seen within the posterior soft tissues. Dystrophic calcifications are seen within the anterior soft tissues. IMPRESSION: No acute fracture or dislocation. Electronically Signed   By: Fidela Salisbury MD   On: 04/13/2020 00:38   DG Chest Port 1 View  Result Date: 04/13/2020 CLINICAL DATA:  Medical clearance for surgery.  Right hip fracture EXAM: PORTABLE CHEST 1 VIEW COMPARISON:  10/06/2019 FINDINGS: The heart size and mediastinal contours are within normal limits. Both lungs are clear. The visualized skeletal structures are unremarkable. IMPRESSION: No active disease. Electronically Signed   By: Fidela Salisbury MD   On: 04/13/2020 01:04   DG Hip Unilat W or Wo Pelvis 2-3 Views Right  Result Date: 04/13/2020 CLINICAL DATA:  Multiple falls, right hip pain EXAM: DG HIP (WITH OR WITHOUT PELVIS) 2-3V RIGHT COMPARISON:  None. FINDINGS: Two view radiograph of the right hip and single view radiograph the pelvis demonstrates a  acute, intratrochanteric fracture of the right hip with varus angulation and mild distraction of the distal fracture fragment. There is avulsion of the lesser trochanter. The femoral head is still seated within the right acetabulum and the joint space is preserved. Mild left hip degenerative arthritis. IMPRESSION: Acute, angulated intratrochanteric right hip fracture with avulsion of the lesser trochanter. Electronically Signed   By: Fidela Salisbury MD   On: 04/13/2020 00:33   DG Femur Min 2 Views Right  Result Date: 04/13/2020 CLINICAL DATA:  Fall, right hip pain EXAM: RIGHT FEMUR 2 VIEWS COMPARISON:  None. FINDINGS: Two view radiograph right femur again demonstrates a a angulated, mildly distracted right intratrochanteric hip fracture with avulsion of the lesser trochanter. The distal femoral shaft appears intact. The femoral head is still seated within the right acetabulum and the joint space is preserved. Vascular calcifications are seen within the posterior soft tissues. IMPRESSION: Angulated, distracted right hip intratrochanteric fracture with avulsion of the lesser trochanter. Electronically Signed   By: Fidela Salisbury MD   On: 65/99/3570 17:79      Delora Fuel, MD 39/03/00 [phone]
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
## 2020-04-13 NOTE — ED Notes (Signed)
Called [facility] for transport to [facility].

## 2020-04-14 ENCOUNTER — Inpatient Hospital Stay (HOSPITAL_COMMUNITY): Payer: Medicaid Other

## 2020-04-14 ENCOUNTER — Inpatient Hospital Stay (HOSPITAL_COMMUNITY): Payer: Medicaid Other | Admitting: Anesthesiology

## 2020-04-14 ENCOUNTER — Encounter (HOSPITAL_COMMUNITY)
Admission: EM | Disposition: A | Discharge status: Skilled Nursing Facility | Payer: Self-pay | Source: Home / Self Care | Attending: Internal Medicine

## 2020-04-14 ENCOUNTER — Encounter (HOSPITAL_COMMUNITY): Payer: Self-pay | Admitting: Family Medicine

## 2020-04-14 DIAGNOSIS — N390 Urinary tract infection, site not specified: Secondary | ICD-10-CM

## 2020-04-14 DIAGNOSIS — E1165 Type 2 diabetes mellitus with hyperglycemia: Secondary | ICD-10-CM

## 2020-04-14 DIAGNOSIS — R451 Restlessness and agitation: Secondary | ICD-10-CM

## 2020-04-14 DIAGNOSIS — Z794 Long term (current) use of insulin: Secondary | ICD-10-CM

## 2020-04-14 DIAGNOSIS — S72141A Displaced intertrochanteric fracture of right femur, initial encounter for closed fracture: Secondary | ICD-10-CM

## 2020-04-14 HISTORY — PX: OTHER SURGICAL HISTORY: SHX169

## 2020-04-14 HISTORY — PX: INTRAMEDULLARY (IM) NAIL INTERTROCHANTERIC: SHX5875

## 2020-04-14 LAB — GLUCOSE, CAPILLARY
Glucose-Capillary: 233 mg/dL — ABNORMAL HIGH (ref 70–99)
Glucose-Capillary: 248 mg/dL — ABNORMAL HIGH (ref 70–99)
Glucose-Capillary: 204 mg/dL — ABNORMAL HIGH (ref 70–99)
Glucose-Capillary: 183 mg/dL — ABNORMAL HIGH (ref 70–99)
Glucose-Capillary: 262 mg/dL — ABNORMAL HIGH (ref 70–99)
Glucose-Capillary: 248 mg/dL — ABNORMAL HIGH (ref 70–99)

## 2020-04-14 LAB — SURGICAL PCR SCREEN
MRSA, PCR: NEGATIVE
Staphylococcus aureus: POSITIVE — AB

## 2020-04-14 LAB — TYPE AND SCREEN
ABO/RH(D): O POS
Antibody Screen: NEGATIVE

## 2020-04-14 SURGERY — FIXATION, FRACTURE, INTERTROCHANTERIC, WITH INTRAMEDULLARY ROD
Anesthesia: General | Site: Leg Upper | Laterality: Right

## 2020-04-14 MED ORDER — LACTATED RINGERS IV SOLN: INTRAVENOUS | Status: DC

## 2020-04-14 MED ORDER — THIAMINE HCL 100 MG/ML IJ SOLN: 100.0000 mg | Freq: Every day | INTRAMUSCULAR | Status: DC

## 2020-04-14 MED ORDER — METOPROLOL SUCCINATE ER 50 MG PO TB24
125.0000 mg | ORAL_TABLET | Freq: Every day | ORAL | Status: DC
  Administered 2020-04-15 – 2020-04-17 (×3): 125 mg via ORAL
  Filled 2020-04-14: qty 2
  Filled 2020-04-14: qty 1
  Filled 2020-04-14: qty 2

## 2020-04-14 MED ORDER — DEXMEDETOMIDINE (PRECEDEX) IN NS 20 MCG/5ML (4 MCG/ML) IV SYRINGE
PREFILLED_SYRINGE | INTRAVENOUS | Status: AC
  Filled 2020-04-14: qty 5

## 2020-04-14 MED ORDER — FENTANYL CITRATE (PF) 250 MCG/5ML IJ SOLN
INTRAMUSCULAR | Status: AC
  Filled 2020-04-14: qty 5

## 2020-04-14 MED ORDER — MIDAZOLAM HCL 2 MG/2ML IJ SOLN
INTRAMUSCULAR | Status: AC
  Filled 2020-04-14: qty 2

## 2020-04-14 MED ORDER — ONDANSETRON HCL 4 MG/2ML IJ SOLN: 4.0000 mg | Freq: Once | INTRAMUSCULAR | Status: DC | PRN

## 2020-04-14 MED ORDER — CHLORHEXIDINE GLUCONATE 0.12 % MT SOLN: 15.0000 mL | Freq: Once | OROMUCOSAL | Status: AC

## 2020-04-14 MED ORDER — MEPERIDINE HCL 25 MG/ML IJ SOLN: 6.2500 mg | INTRAMUSCULAR | Status: DC | PRN

## 2020-04-14 MED ORDER — CEFAZOLIN SODIUM-DEXTROSE 2-4 GM/100ML-% IV SOLN
2.0000 g | Freq: Four times a day (QID) | INTRAVENOUS | Status: AC
Start: 2020-04-14 — End: 2020-04-14
  Administered 2020-04-14 (×2): 2 g via INTRAVENOUS
  Filled 2020-04-14 (×2): qty 100

## 2020-04-14 MED ORDER — OXYCODONE HCL 5 MG/5ML PO SOLN: 5.0000 mg | Freq: Once | ORAL | Status: DC | PRN

## 2020-04-14 MED ORDER — FENTANYL CITRATE (PF) 100 MCG/2ML IJ SOLN: 25.0000 ug | INTRAMUSCULAR | Status: DC | PRN

## 2020-04-14 MED ORDER — METOCLOPRAMIDE HCL 5 MG/ML IJ SOLN
5.0000 mg | Freq: Three times a day (TID) | INTRAMUSCULAR | Status: DC | PRN
Start: 2020-04-14 — End: 2020-04-21

## 2020-04-14 MED ORDER — HYDROMORPHONE HCL 1 MG/ML IJ SOLN
INTRAMUSCULAR | Status: AC
  Filled 2020-04-14: qty 0.5

## 2020-04-14 MED ORDER — ALBUMIN HUMAN 5 % IV SOLN: INTRAVENOUS | Status: DC | PRN

## 2020-04-14 MED ORDER — PROPOFOL 10 MG/ML IV BOLUS
INTRAVENOUS | Status: DC | PRN
  Administered 2020-04-14: 150 mg via INTRAVENOUS

## 2020-04-14 MED ORDER — THIAMINE HCL 100 MG PO TABS: 100.0000 mg | ORAL_TABLET | Freq: Every day | ORAL | Status: DC

## 2020-04-14 MED ORDER — LORAZEPAM 1 MG PO TABS: 1.0000 mg | ORAL_TABLET | ORAL | Status: DC | PRN

## 2020-04-14 MED ORDER — PHENOL 1.4 % MT LIQD: 1.0000 | OROMUCOSAL | Status: DC | PRN

## 2020-04-14 MED ORDER — MIDAZOLAM HCL 2 MG/2ML IJ SOLN
INTRAMUSCULAR | Status: DC | PRN
  Administered 2020-04-14: 2 mg via INTRAVENOUS

## 2020-04-14 MED ORDER — OXYCODONE HCL 5 MG PO TABS
10.0000 mg | ORAL_TABLET | Freq: Four times a day (QID) | ORAL | Status: DC | PRN
  Administered 2020-04-14 – 2020-04-20 (×20): 10 mg via ORAL
  Filled 2020-04-14 (×21): qty 2

## 2020-04-14 MED ORDER — PHENYLEPHRINE 40 MCG/ML (10ML) SYRINGE FOR IV PUSH (FOR BLOOD PRESSURE SUPPORT)
PREFILLED_SYRINGE | INTRAVENOUS | Status: DC | PRN
  Administered 2020-04-14 (×5): 80 ug via INTRAVENOUS

## 2020-04-14 MED ORDER — ASPIRIN EC 325 MG PO TBEC: 325.0000 mg | DELAYED_RELEASE_TABLET | Freq: Every day | ORAL | 0 refills | Status: DC

## 2020-04-14 MED ORDER — LORAZEPAM 2 MG/ML IJ SOLN: 1.0000 mg | INTRAMUSCULAR | Status: DC | PRN

## 2020-04-14 MED ORDER — METOCLOPRAMIDE HCL 5 MG PO TABS: 5.0000 mg | ORAL_TABLET | Freq: Three times a day (TID) | ORAL | Status: DC | PRN

## 2020-04-14 MED ORDER — HYDROMORPHONE HCL 1 MG/ML IJ SOLN
INTRAMUSCULAR | Status: DC | PRN
  Administered 2020-04-14: .5 mg via INTRAVENOUS

## 2020-04-14 MED ORDER — DEXMEDETOMIDINE (PRECEDEX) IN NS 20 MCG/5ML (4 MCG/ML) IV SYRINGE
PREFILLED_SYRINGE | INTRAVENOUS | Status: DC | PRN
  Administered 2020-04-14: 4 ug via INTRAVENOUS
  Administered 2020-04-14: 8 ug via INTRAVENOUS

## 2020-04-14 MED ORDER — SODIUM CHLORIDE 0.9 % IV SOLN: INTRAVENOUS | Status: DC

## 2020-04-14 MED ORDER — MENTHOL 3 MG MT LOZG: 1.0000 | LOZENGE | OROMUCOSAL | Status: DC | PRN

## 2020-04-14 MED ORDER — LIDOCAINE 2% (20 MG/ML) 5 ML SYRINGE
INTRAMUSCULAR | Status: DC | PRN
  Administered 2020-04-14: 100 mg via INTRAVENOUS

## 2020-04-14 MED ORDER — PHENYLEPHRINE HCL-NACL 10-0.9 MG/250ML-% IV SOLN
INTRAVENOUS | Status: DC | PRN
  Administered 2020-04-14: 50 ug/min via INTRAVENOUS

## 2020-04-14 MED ORDER — CHLORHEXIDINE GLUCONATE 0.12 % MT SOLN
OROMUCOSAL | Status: AC
  Administered 2020-04-14: 15 mL via OROMUCOSAL
  Filled 2020-04-14: qty 15

## 2020-04-14 MED ORDER — DOCUSATE SODIUM 100 MG PO CAPS
100.0000 mg | ORAL_CAPSULE | Freq: Two times a day (BID) | ORAL | Status: DC
  Administered 2020-04-14 – 2020-04-16 (×5): 100 mg via ORAL
  Filled 2020-04-14 (×5): qty 1

## 2020-04-14 MED ORDER — ACETAMINOPHEN 325 MG PO TABS: 325.0000 mg | ORAL_TABLET | ORAL | Status: DC | PRN

## 2020-04-14 MED ORDER — ADULT MULTIVITAMIN W/MINERALS CH: 1.0000 | ORAL_TABLET | Freq: Every day | ORAL | Status: DC

## 2020-04-14 MED ORDER — FOLIC ACID 1 MG PO TABS: 1.0000 mg | ORAL_TABLET | Freq: Every day | ORAL | Status: DC

## 2020-04-14 MED ORDER — TRANEXAMIC ACID-NACL 1000-0.7 MG/100ML-% IV SOLN
INTRAVENOUS | Status: DC | PRN
  Administered 2020-04-14: 1000 mg via INTRAVENOUS

## 2020-04-14 MED ORDER — ACETAMINOPHEN 160 MG/5ML PO SOLN: 325.0000 mg | ORAL | Status: DC | PRN

## 2020-04-14 MED ORDER — KETAMINE HCL 50 MG/5ML IJ SOSY
PREFILLED_SYRINGE | INTRAMUSCULAR | Status: AC
  Filled 2020-04-14: qty 5

## 2020-04-14 MED ORDER — OXYCODONE HCL 5 MG PO TABS: 5.0000 mg | ORAL_TABLET | Freq: Once | ORAL | Status: DC | PRN

## 2020-04-14 MED ORDER — FENTANYL CITRATE (PF) 250 MCG/5ML IJ SOLN
INTRAMUSCULAR | Status: DC | PRN
  Administered 2020-04-14: 50 ug via INTRAVENOUS
  Administered 2020-04-14: 100 ug via INTRAVENOUS
  Administered 2020-04-14 (×2): 50 ug via INTRAVENOUS
  Administered 2020-04-14: 100 ug via INTRAVENOUS

## 2020-04-14 MED ORDER — TRANEXAMIC ACID-NACL 1000-0.7 MG/100ML-% IV SOLN
INTRAVENOUS | Status: AC
  Filled 2020-04-14: qty 100

## 2020-04-14 MED ORDER — EPHEDRINE 5 MG/ML INJ
INTRAVENOUS | Status: AC
  Filled 2020-04-14: qty 10

## 2020-04-14 MED ORDER — 0.9 % SODIUM CHLORIDE (POUR BTL) OPTIME
TOPICAL | Status: DC | PRN
  Administered 2020-04-14: 1000 mL

## 2020-04-14 MED ORDER — ORAL CARE MOUTH RINSE: 15.0000 mL | Freq: Once | OROMUCOSAL | Status: AC

## 2020-04-14 MED ORDER — PHENYLEPHRINE 40 MCG/ML (10ML) SYRINGE FOR IV PUSH (FOR BLOOD PRESSURE SUPPORT)
PREFILLED_SYRINGE | INTRAVENOUS | Status: AC
  Filled 2020-04-14: qty 10

## 2020-04-14 MED ORDER — METOPROLOL SUCCINATE ER 25 MG PO TB24
25.0000 mg | ORAL_TABLET | Freq: Once | ORAL | Status: AC
  Administered 2020-04-14: 25 mg via ORAL
  Filled 2020-04-14: qty 1

## 2020-04-14 MED ORDER — ACETAMINOPHEN 325 MG PO TABS: 650.0000 mg | ORAL_TABLET | ORAL | 2 refills | Status: DC | PRN

## 2020-04-14 SURGICAL SUPPLY — 37 items
BIT DRILL CALIBRATED 4.2 (BIT) ×1 IMPLANT
BLADE SURG 15 STRL LF DISP TIS (BLADE) ×1 IMPLANT
BLADE SURG 15 STRL SS (BLADE) ×1
BLADE TFNA HELICAL 115 NS (Anchor) ×2 IMPLANT
BNDG COHESIVE 4X5 TAN STRL (GAUZE/BANDAGES/DRESSINGS) ×2 IMPLANT
COVER BACK TABLE 60X90IN (DRAPES) ×2 IMPLANT
COVER PERINEAL POST (MISCELLANEOUS) ×2 IMPLANT
COVER SURGICAL LIGHT HANDLE (MISCELLANEOUS) ×2 IMPLANT
COVER WAND RF STERILE (DRAPES) ×2 IMPLANT
DRAPE C-ARM 42X72 X-RAY (DRAPES) ×2 IMPLANT
DRAPE STERI IOBAN 125X83 (DRAPES) ×2 IMPLANT
DRILL BIT CALIBRATED 4.2 (BIT) ×2
DRSG ADAPTIC 3X8 NADH LF (GAUZE/BANDAGES/DRESSINGS) ×2 IMPLANT
DRSG MEPILEX BORDER 4X12 (GAUZE/BANDAGES/DRESSINGS) ×2 IMPLANT
DRSG MEPILEX BORDER 4X4 (GAUZE/BANDAGES/DRESSINGS) ×2 IMPLANT
DRSG MEPILEX BORDER 4X8 (GAUZE/BANDAGES/DRESSINGS) ×2 IMPLANT
ELECT REM PT RETURN 9FT ADLT (ELECTROSURGICAL) ×2
ELECTRODE REM PT RTRN 9FT ADLT (ELECTROSURGICAL) ×1 IMPLANT
EVACUATOR 1/8 PVC DRAIN (DRAIN) IMPLANT
GAUZE SPONGE 4X4 12PLY STRL LF (GAUZE/BANDAGES/DRESSINGS) ×2 IMPLANT
GLOVE BIOGEL PI IND STRL 9 (GLOVE) ×1 IMPLANT
GLOVE BIOGEL PI INDICATOR 9 (GLOVE) ×1
GLOVE SURG ORTHO 9.0 STRL STRW (GLOVE) ×2 IMPLANT
GOWN STRL REUS W/ TWL XL LVL3 (GOWN DISPOSABLE) ×3 IMPLANT
GOWN STRL REUS W/TWL XL LVL3 (GOWN DISPOSABLE) ×3
GUIDEWIRE 3.2X400 (WIRE) ×2 IMPLANT
KIT BASIN OR (CUSTOM PROCEDURE TRAY) ×2 IMPLANT
KIT TURNOVER KIT B (KITS) ×2 IMPLANT
MANIFOLD NEPTUNE II (INSTRUMENTS) ×2 IMPLANT
NAIL TROCH FIX 10X170 130 (Nail) ×2 IMPLANT
NS IRRIG 1000ML POUR BTL (IV SOLUTION) ×2 IMPLANT
PACK GENERAL/GYN (CUSTOM PROCEDURE TRAY) ×2 IMPLANT
PAD ARMBOARD 7.5X6 YLW CONV (MISCELLANEOUS) ×4 IMPLANT
SCREW LOCK STAR 5X34 (Screw) ×2 IMPLANT
STAPLER VISISTAT 35W (STAPLE) IMPLANT
SUT VIC AB 2-0 CTB1 (SUTURE) IMPLANT
WATER STERILE IRR 1000ML POUR (IV SOLUTION) ×4 IMPLANT

## 2020-04-14 NOTE — Anesthesia Procedure Notes (Signed)
Procedure Name: LMA Insertion Date/Time: 04/14/2020 9:54 AM Performed by: Renato Shin, CRNA Pre-anesthesia Checklist: Patient identified, Emergency Drugs available, Suction available and Patient being monitored Patient Re-evaluated:Patient Re-evaluated prior to induction Oxygen Delivery Method: Circle system utilized Preoxygenation: Pre-oxygenation with 100% oxygen Induction Type: IV induction LMA: LMA inserted LMA Size: 5.0 Number of attempts: 1 Placement Confirmation: breath sounds checked- equal and bilateral Tube secured with: Tape Dental Injury: Teeth and Oropharynx as per pre-operative assessment

## 2020-04-14 NOTE — Progress Notes (Signed)
Pt is alert and oriented to self, time and situation, disoriented to place. Pt now more cooperative. Placed on continuous pulse ox but Pt removed it. Cardiac monitor in place, IVF infusing. Pt refused to wear gown but agreeable to blankets for cover. Due meds given. Pt reoriented.
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
## 2020-04-14 NOTE — Progress Notes (Signed)
PROGRESS NOTE    [name]  INO:[id number] DOB: 11/02/1955 DOA: 04/12/2020 PCP: [facility]    Chief Complaint  Patient presents with  . Fall    Brief Narrative:  [name]  is a 65 y.o. male, with history of hypertension, diabetic foot ulcer, diabetes mellitus type 2, and more presents the ED with a chief complaint of fall.  Patient reports 3 or 4 days ago he was walking in a parking lot and suffered a mechanical fall Since then he has been having pain with weightbearing on the right leg, he presented to [facility], ER found to have intertrochanteric  fracture of right hip, is transferred to [facility]  Subjective:  He is seen after surgery, he appear confused, he appears having visual hallucination  (reports seeing people behind me) Reports right leg pain  Assessment & Plan:   Active Problems:   Chronic pain   Uncontrolled type 2 diabetes mellitus with hyperglycemia, with long-term current use of insulin (HCC)   Essential hypertension, benign   Fx intertrochanteric hip (HCC)   Acute lower UTI   Anxiety   Agitation   Closed intertrochanteric fracture of right hip, initial encounter (HCC)  Right hip fracture -Report from mechanical fall -Status post IM nailing right hip by [name] on 4/1 -Postop DVT prophylaxis , wound care , weightbearing status pain control per Ortho , appreciate Ortho input  Diabetic foot ulcer, right foot between second and third toe Irrigated in the OR, now wrapped Management per Ortho  UTI Urine culture in process, he started on Rocephin since admission, continue, follow urine culture  Hypertension Increase beta-blocker due to tachycardia and hypertension, continue Norvasc and lisinopril  Insulin-dependent type 2 diabetes,  A1c 7.2 A.m. blood glucose 223 Continue Levemir and SSI  Per report patient has anxiety and aggressive behavior, has Haldol and Xanax as needed, per previous note there  is a concern of alcohol withdrawal  Chronic pain On oxycodone 15 mg q4hrs prn  at home  Nutritional Assessment: The patient's BMI is: Body mass index is 30.29 kg/m.Marland Kitchen    Unresulted Labs (From admission, onward)          Start     Ordered   04/15/20 0500  CBC  Daily,   R      04/14/20 1141   04/15/20 0962  Basic metabolic panel  Daily,   R      04/14/20 1141   04/14/20 0834  Culture, Urine  Once,   R        04/14/20 0833            DVT prophylaxis: SCDs Start: 04/14/20 1142 heparin injection 5,000 Units Start: 04/13/20 0600 SCDs Start: 04/13/20 0408   Code Status: DNR Family Communication: called contact listed on chart his son and left message for him to call back if questions Disposition:   Status is: Inpatient  Dispo: The patient is from: home              Anticipated d/c is to: [facility] vs SNF              Anticipated d/c date is: TBD, needs Ortho clearance                Consultants:   Orthopedics  Procedures:   IM nailing right hip  Irrigation right foot ulcer  Antimicrobials:    Anti-infectives (From admission, onward)   Start     Dose/Rate Route Frequency Ordered  Stop   04/14/20 1600  ceFAZolin (ANCEF) IVPB 2g/100 mL premix        2 g 200 mL/hr over 30 Minutes Intravenous Every 6 hours 04/14/20 1141 04/15/20 0359   04/14/20 0815  ceFAZolin (ANCEF) IVPB 2g/100 mL premix        2 g 200 mL/hr over 30 Minutes Intravenous On call to O.R. 04/13/20 1835 04/14/20 1033   04/13/20 0415  cefTRIAXone (ROCEPHIN) 1 g in sodium chloride 0.9 % 100 mL IVPB        1 g 200 mL/hr over 30 Minutes Intravenous Every 24 hours 04/13/20 0407           Objective: Vitals:   04/14/20 1142 04/14/20 1320 04/14/20 1440 04/14/20 1653  BP: (!) 150/90 (!) 147/80 (!) 156/91 140/74  Pulse: 95 92 89 95  Resp: 19 18  17   Temp: 98.1 F (36.7 C) 98.2 F (36.8 C)  98.8 F (37.1 C)  TempSrc: Oral Oral  Oral  SpO2: 95% 95%  94%  Weight:        Intake/Output Summary (Last 24  hours) at 04/14/2020 1801 Last data filed at 04/14/2020 1619 Gross per 24 hour  Intake 1373.41 ml  Output 400 ml  Net 973.41 ml   Filed Weights   04/12/20 2118  Weight: 107 kg    Examination:  General exam: confused, appear to have hallucination Respiratory system: Clear to auscultation. Respiratory effort normal. Cardiovascular system: S1 & S2 heard, RRR. No JVD, no murmur, No pedal edema. Gastrointestinal system: Abdomen is nondistended, soft and nontender. Normal bowel sounds heard. Central nervous system: confused Extremities: prior left foot partial amputation , right foot wrapped, right hip post op changes Skin: right foot diabetic ulcer Psychiatry: confused     Data Reviewed: I have personally reviewed following labs and imaging studies  CBC: Recent Labs  Lab 04/12/20 2320 04/13/20 0549  WBC 10.5 8.5  NEUTROABS 8.8* 6.6  HGB 9.6* 8.9*  HCT 27.9* 25.8*  MCV 91.5 90.8  PLT 235 423    Basic Metabolic Panel: Recent Labs  Lab 04/12/20 2320 04/13/20 0549 04/13/20 1053  NA 133* 134* 135  K 4.1 3.7 3.7  CL 98 100 102  CO2 23 24 25   GLUCOSE 236* 223* 208*  BUN 21 21 22   CREATININE 1.10 0.97 0.97  CALCIUM 8.7* 8.6* 8.7*  MG  --  1.8 1.9  PHOS  --   --  3.2    GFR: CrCl cannot be calculated (Unknown ideal weight.).  Liver Function Tests: Recent Labs  Lab 04/13/20 0549 04/13/20 1053  AST 21 19  ALT 17 16  ALKPHOS 62 61  BILITOT 1.4* 1.0  PROT 7.0 7.0  ALBUMIN 3.0* 2.9*    CBG: Recent Labs  Lab 04/14/20 0653 04/14/20 0925 04/14/20 1107 04/14/20 1152 04/14/20 1649  GLUCAP 233* 248* 204* 183* 262*     Recent Results (from the past 240 hour(s))  SARS CORONAVIRUS 2 (TAT 6-24 HRS) Nasopharyngeal Nasopharyngeal Swab     Status: None   Collection Time: 04/13/20  1:50 AM   Specimen: Nasopharyngeal Swab  Result Value Ref Range Status   SARS Coronavirus 2 NEGATIVE NEGATIVE Final    Comment: (NOTE) SARS-CoV-2 target nucleic acids are NOT  DETECTED.  The SARS-CoV-2 RNA is generally detectable in upper and lower respiratory specimens during the acute phase of infection. Negative results do not preclude SARS-CoV-2 infection, do not rule out co-infections with other pathogens, and should not be used as the  sole basis for treatment or other patient management decisions. Negative results must be combined with clinical observations, patient history, and epidemiological information. The expected result is Negative.  Fact Sheet for Patients: SugarRoll.be  Fact Sheet for Healthcare Providers: https://www.woods-mathews.com/  This test is not yet approved or cleared by the [place] FDA and  has been authorized for detection and/or diagnosis of SARS-CoV-2 by FDA under an Emergency Use Authorization (EUA). This EUA will remain  in effect (meaning this test can be used) for the duration of the COVID-19 declaration under Se ction 564(b)(1) of the Act, 21 U.S.C. section 360bbb-3(b)(1), unless the authorization is terminated or revoked sooner.  Performed at [facility], [place] [address], [place], [place] [address]   Surgical pcr screen     Status: Abnormal   Collection Time: 04/14/20  8:29 AM   Specimen: Nasal Mucosa; Nasal Swab  Result Value Ref Range Status   MRSA, PCR NEGATIVE NEGATIVE Final   Staphylococcus aureus POSITIVE (A) NEGATIVE Final    Comment: (NOTE) The Xpert SA Assay (FDA approved for NASAL specimens in patients 51 years of age and older), is one component of a comprehensive surveillance program. It is not intended to diagnose infection nor to guide or monitor treatment. Performed at [facility], [place] [address], [place], [place] [address]          Radiology Studies: DG Tibia/Fibula Right  Result Date: 04/13/2020 CLINICAL DATA:  Fall, pain EXAM: RIGHT TIBIA AND FIBULA - 2 VIEW COMPARISON:  None. FINDINGS: Two view radiograph right tibia and fibula  demonstrates normal alignment. No fracture or dislocation. 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 Vascular calcifications are seen within the posterior soft tissues. Dystrophic calcifications are seen within the anterior soft tissues. IMPRESSION: No acute fracture or dislocation. Electronically Signed   By: Fidela Salisbury MD   On: 04/13/2020 00:38   DG Chest Port 1 View  Result Date: 04/13/2020 CLINICAL DATA:  Medical clearance for surgery.  Right hip fracture EXAM: PORTABLE CHEST 1 VIEW COMPARISON:  10/06/2019 FINDINGS: The heart size and mediastinal contours are within normal limits. Both lungs are clear. The visualized skeletal structures are unremarkable. IMPRESSION: No active disease. Electronically Signed   By: Fidela Salisbury MD   On: 04/13/2020 01:04   DG C-Arm 1-60 Min  Result Date: 04/14/2020 CLINICAL DATA:  Trauma. Additional history provided: Surgery, right hip IM nail (short length). Provided fluoroscopy time 1 minutes (23.12 mGy). EXAM: OPERATIVE right HIP (WITH PELVIS IF PERFORMED) 2 VIEWS TECHNIQUE: Fluoroscopic spot image(s) were submitted for interpretation post-operatively. COMPARISON:  Radiographs of the right hip 04/12/2020. FINDINGS: Two intraoperative fluoroscopic images of the right hip are submitted. The images demonstrate an intramedullary nail within the proximal right femur. An interlocking component extends into the femoral head and neck. Hardware traverses a known intertrochanteric femur fracture. Persistent although improved displacement at the fracture site. There is an additional interlocking screw within the femoral shaft. Redemonstrated known associated avulsion fracture of the lesser trochanter. IMPRESSION: Two intraoperative fluoroscopic images of the right hip, as described. Electronically Signed   By: Kellie Simmering DO   On: 04/14/2020 11:36   DG HIP OPERATIVE UNILAT WITH PELVIS  RIGHT  Result Date: 04/14/2020 CLINICAL DATA:  Trauma. Additional history provided: Surgery, right hip IM nail (short length). Provided fluoroscopy time 1 minutes (23.12 mGy). EXAM: OPERATIVE right HIP (WITH PELVIS IF PERFORMED) 2 VIEWS TECHNIQUE: Fluoroscopic spot image(s) were  submitted for interpretation post-operatively. COMPARISON:  Radiographs of the right hip 04/12/2020. FINDINGS: Two intraoperative fluoroscopic images of the right hip are submitted. The images demonstrate an intramedullary nail within the proximal right femur. An interlocking component extends into the femoral head and neck. Hardware traverses a known intertrochanteric femur fracture. Persistent although improved displacement at the fracture site. There is an additional interlocking screw within the femoral shaft. Redemonstrated known associated avulsion fracture of the lesser trochanter. IMPRESSION: Two intraoperative fluoroscopic images of the right hip, as described. Electronically Signed   By: Kellie Simmering DO   On: 04/14/2020 11:36   DG Hip Unilat W or Wo Pelvis 2-3 Views Right  Result Date: 04/13/2020 CLINICAL DATA:  Multiple falls, right hip pain EXAM: DG HIP (WITH OR WITHOUT PELVIS) 2-3V RIGHT COMPARISON:  None. FINDINGS: Two view radiograph of the right hip and single view radiograph the pelvis demonstrates a acute, intratrochanteric fracture of the right hip with varus angulation and mild distraction of the distal fracture fragment. There is avulsion of the lesser trochanter. The femoral head is still seated within the right acetabulum and the joint space is preserved. Mild left hip degenerative arthritis. IMPRESSION: Acute, angulated intratrochanteric right hip fracture with avulsion of the lesser trochanter. Electronically Signed   By: Fidela Salisbury MD   On: 04/13/2020 00:33   DG Femur Min 2 Views Right  Result Date: 04/13/2020 CLINICAL DATA:  Fall, right hip pain EXAM: RIGHT FEMUR 2 VIEWS COMPARISON:  None. FINDINGS:  Two view radiograph right femur again demonstrates a a angulated, mildly distracted right intratrochanteric hip fracture with avulsion of the lesser trochanter. The distal femoral shaft appears intact. The femoral head is still seated within the right acetabulum and the joint space is preserved. Vascular calcifications are seen within the posterior soft tissues. IMPRESSION: Angulated, distracted right hip intratrochanteric fracture with avulsion of the lesser trochanter. Electronically Signed   By: Fidela Salisbury MD   On: 04/13/2020 00:40        Scheduled Meds: . amitriptyline  25 mg Oral QHS  . amLODipine  5 mg Oral Daily  . aspirin EC  81 mg Oral QODAY  . docusate sodium  100 mg Oral BID  . folic acid  1 mg Oral Daily  . heparin  5,000 Units Subcutaneous Q8H  . insulin aspart  0-20 Units Subcutaneous TID WC  . insulin aspart  0-5 Units Subcutaneous QHS  . insulin detemir  50 Units Subcutaneous QHS  . lisinopril  40 mg Oral Daily  . [START ON 04/15/2020] metoprolol succinate  125 mg Oral Daily  . multivitamin with minerals  1 tablet Oral Daily  . pentoxifylline  400 mg Oral TID WC  . thiamine  100 mg Oral Daily   Or  . thiamine  100 mg Intravenous Daily   Continuous Infusions: . sodium chloride 75 mL/hr at 04/14/20 1619  .  ceFAZolin (ANCEF) IV 2 g (04/14/20 1645)  . cefTRIAXone (ROCEPHIN)  IV Stopped (04/14/20 0456)     LOS: 1 day   Time spent:35 mins Greater than 50% of this time was spent in counseling, explanation of diagnosis, planning of further management, and coordination of care.   Voice Recognition Viviann Spare dictation system was used to create this note, attempts have been made to correct errors. Please contact the author with questions and/or clarifications.   Florencia Reasons, MD PhD FACP [facility]  Available via Epic secure chat 7am-7pm for nonurgent issues Please page for urgent issues To page  the attending provider between 7A-7P or the covering provider during  after hours 7P-7A, please log into the web site [email or web address] and access using universal [place] password for that web site. If you do not have the password, please call the hospital operator.    04/14/2020, 6:01 PM

## 2020-04-14 NOTE — Progress Notes (Signed)
   04/14/20 0817  Assess: MEWS Score  Temp 99.4 F (37.4 C)  BP (!) 166/82  Pulse Rate (!) 113  Resp 18  Level of Consciousness Alert  SpO2 96 %  O2 Device Room Air  Assess: MEWS Score  MEWS Temp 0  MEWS Systolic 0  MEWS Pulse 2  MEWS RR 0  MEWS LOC 0  MEWS Score 2  MEWS Score Color Yellow  Assess: if the MEWS score is Yellow or Red  Were vital signs taken at a resting state? Yes  Focused Assessment No change from prior assessment  Early Detection of Sepsis Score *See Row Information* Low  MEWS guidelines implemented *See Row Information* Yes  Treat  MEWS Interventions Administered scheduled meds/treatments;Other (Comment) (received order for urine culture)  Pain Scale 0-10  Pain Score 2  Pain Type Acute pain  Pain Location Hip  Pain Orientation Right  Pain Descriptors / Indicators Aching  Pain Frequency Intermittent  Pain Onset With Activity  Pain Intervention(s) Rest  Take Vital Signs  Increase Vital Sign Frequency  Yellow: Q 2hr X 2 then Q 4hr X 2, if remains yellow, continue Q 4hrs  Escalate  MEWS: Escalate Yellow: discuss with charge nurse/RN and consider discussing with provider and RRT  Notify: Charge Nurse/RN  Name of Charge Nurse/RN Notified Marissa/Susan  Date Charge Nurse/RN Notified 04/14/20  Time Charge Nurse/RN Notified 2536  Notify: Provider  Provider Name/Title Dr. Florencia Reasons  Date Provider Notified 04/14/20  Time Provider Notified [phone]  Notification Type  (secure chat)  Notification Reason Other (Comment) (elevated HR)  Provider response See new orders  Date of Provider Response 04/14/20  Time of Provider Response [phone]  Notify: Rapid Response  Name of Rapid Response RN Notified na    Pt alert and oriented x 4, not in distress, assessment performed. Scheduled metoprolol given. MD notified, received new orders. Pt unable to void at this time for urine culture. Pt scheduled for surgery, endorsed accordingly to [place], RN.

## 2020-04-14 NOTE — Plan of Care (Signed)

## 2020-04-14 NOTE — Transfer of Care (Signed)
Immediate Anesthesia Transfer of Care Note  Patient: [name]  Procedure(s) Performed: INTRAMEDULLARY (IM) NAIL INTERTROCHANTRIC (Right Leg Upper)  Patient Location: PACU  Anesthesia Type:General  Level of Consciousness: drowsy and patient cooperative  Airway & Oxygen Therapy: Patient Spontanous Breathing and Patient connected to face mask oxygen  Post-op Assessment: Report given to RN and Post -op Vital signs reviewed and stable  Post vital signs: Reviewed and stable  Last Vitals:  Vitals Value Taken Time  BP 148/87 04/14/20 1103  Temp    Pulse 88 04/14/20 1107  Resp 26 04/14/20 1107  SpO2 100 % 04/14/20 1107  Vitals shown include unvalidated device data.  Last Pain:  Vitals:   04/14/20 0937  TempSrc:   PainSc: 9       Patients Stated Pain Goal: 3 (74/08/14 4818)  Complications: No complications documented.

## 2020-04-14 NOTE — Anesthesia Preprocedure Evaluation (Addendum)
Anesthesia Evaluation  Patient identified by MRN, date of birth, ID band Patient awake    Reviewed: Allergy & Precautions, NPO status , Patient's Chart, lab work & pertinent test results, reviewed documented beta blocker date and time   History of Anesthesia Complications Negative for: history of anesthetic complications  Airway Mallampati: I  TM Distance: >3 FB Neck ROM: Full    Dental  (+) Dental Advisory Given, Poor Dentition, Missing, Teeth Intact   Pulmonary neg pulmonary ROS,    Pulmonary exam normal        Cardiovascular hypertension, Pt. on medications and Pt. on home beta blockers Normal cardiovascular exam     Neuro/Psych Anxiety  Neuromuscular disease negative psych ROS   GI/Hepatic negative GI ROS, (+) Hepatitis - NASH    Endo/Other  diabetes, Poorly Controlled, Type 2, Insulin Dependent Obesity   Renal/GU Renal diseasenegative Renal ROS     Musculoskeletal  (+) Arthritis ,   Abdominal (+) + obese,   Peds  Hematology  (+) Blood dyscrasia, anemia ,   Anesthesia Other Findings Covid neg 5/12   Reproductive/Obstetrics                            Anesthesia Physical  Anesthesia Plan  ASA: III  Anesthesia Plan: General   Post-op Pain Management:    Induction: Intravenous  PONV Risk Score and Plan: 2 and Treatment may vary due to age or medical condition, Ondansetron and Midazolam  Airway Management Planned: LMA  Additional Equipment: None  Intra-op Plan:   Post-operative Plan: Extubation in OR  Informed Consent: I have reviewed the patients History and Physical, chart, labs and discussed the procedure including the risks, benefits and alternatives for the proposed anesthesia with the patient or authorized representative who has indicated his/her understanding and acceptance.     Dental advisory given  Plan Discussed with: CRNA and  Anesthesiologist  Anesthesia Plan Comments:       Anesthesia Quick Evaluation

## 2020-04-14 NOTE — Op Note (Signed)
DATE OF SURGERY:  04/14/2020  TIME: 11:03 AM  PATIENT NAME:  [name]     AGE: 65 y.o.  PRE-OPERATIVE DIAGNOSIS:  closed intertrochantric hip fracture  POST-OPERATIVE DIAGNOSIS:  SAME  PROCEDURE:  INTRAMEDULLARY (IM) NAIL INTERTROCHANTRIC c-arm flouroscopy  SURGEON:  [name]  ASSISTANT:  none.  OPERATIVE IMPLANTS: Synthes trochanteric femoral nail with interlocking helical blade, locked proximally and distally. 10x151m, 1122mblade, 3433mcrew  PREOPERATIVE INDICATIONS:  [name] who fell and suffered a hip fracture. He was brought into the ER and then admitted and optimized and then elected for surgical intervention.    The risks benefits and alternatives were discussed with the patient including but not limited to the risks of nonoperative treatment, versus surgical intervention including infection, bleeding, nerve injury, malunion, nonunion, hardware prominence, hardware failure, need for hardware removal, blood clots, cardiopulmonary complications, morbidity, mortality, among others, and they were willing to proceed.    OPERATIVE PROCEDURE:  The patient was brought to the operating room and placed in the supine position on the JacAldaacture table. General anesthesia was administered. He was placed on the fracture table.  Closed reduction was performed under C-arm guidance. Time out was then performed after sterile prep with Duraprep and draped into a sterile field with the shower curtain. He received preoperative antibiotics.  Incision was made proximal to the greater trochanter. A guidewire was placed centered on the greater trochanter. Confirmation was made on AP and lateral views. The femoral canal was then reamed over the wire. Wire and reamer were removed and the nail was inserted.  A guidepin was placed through the jig into the femoral head close to the center center position of the femoral head. The helical screw length was measured the  cortex was reamed and the helical screw was inserted. C-arm was used to verify position of the spiral blade.  The compression screw proximally lock the spiral blade.  The distal interlock was placed using the jig.  Instruments were removed and final C-arm pictures AP and lateral were obtained.  The wounds were irrigated  and closed with Vicryl followed by staples and Mepilex dressing. The patient was awakened and returned to PACU in stable and satisfactory condition. There no complications. Weightbearing as tolerated.  MarNewt Minion

## 2020-04-14 NOTE — Plan of Care (Addendum)
One dose of haldol administered d/t patient being very verbally abusive and actively trying to level. Spoke with family member (possibly daughter) who states she believed the patient had dementia and noticed it progressing recently. Will continue to watch patient.   Problem: Education: Goal: Knowledge of General Education information will improve Description: Including pain rating scale, medication(s)/side effects and non-pharmacologic comfort measures Outcome: Progressing   Problem: Activity: Goal: Risk for activity intolerance will decrease Outcome: Progressing   Problem: Pain Managment: Goal: General experience of comfort will improve Outcome: Progressing   Problem: Safety: Goal: Ability to remain free from injury will improve Outcome: Progressing   Problem: Skin Integrity: Goal: Risk for impaired skin integrity will decrease Outcome: Progressing

## 2020-04-14 NOTE — TOC Initial Note (Signed)
Transition of Care [facility]) - Initial/Assessment Note    Patient Details  Name: [name] MRN: [id number] Date of Birth: [birth year]  Transition of Care [facility]) CM/SW Contact:    Sharin Mons, RN Phone Number: 04/14/2020, 4:50 PM  Clinical Narrative:                 Admitted s/p fall, suffered R hip fx.    -s/p intramedullary nail fixation of the right hip fracture 4/1  TOC team monitoring and will assist with TOC needs.  Expected Discharge Plan: Central Barriers to Discharge: Continued Medical Work up   Patient Goals and CMS Choice        Expected Discharge Plan and Services Expected Discharge Plan: [place]                                              Prior Living Arrangements/Services                       Activities of Daily Living      Permission Sought/Granted                  Emotional Assessment              Admission diagnosis:  Hyponatremia [E87.1] Fx intertrochanteric hip (HCC) [S72.143A] Normochromic normocytic anemia [D64.9] Closed intertrochanteric fracture of right hip, initial encounter [facility]) [S72.141A] Patient Active Problem List   Diagnosis Date Noted  . Closed intertrochanteric fracture of right hip, initial encounter ([place])   . Fx intertrochanteric hip ([place]) 04/13/2020  . Acute lower UTI 04/13/2020  . Anxiety 04/13/2020  . Agitation 04/13/2020  . Wound dehiscence 05/26/2019  . Dehiscence of amputation stump ([place])   . Wound infection   . Acute osteomyelitis of left foot ([place]) 04/11/2019  . Lactic acidosis 04/11/2019  . Sepsis due to Enterobacter ([place]) 04/11/2019  . Diabetic foot ulcer ([place]) 03/25/2019  . Idiopathic chronic gout of left foot without tophus   . Myelopathy ([place]) 07/16/2013  . Ingrown nail 07/05/2013  . Paronychia 07/05/2013  . Diverticulosis 04/22/2013  . Acute osteomyelitis of humerus ([place]) 02/07/2013  . Osteomyelitis of left foot ([place]) 02/01/2013   . Joint infection ([place]) 01/22/2013  . Pyogenic arthritis of shoulder region ([place]) 01/15/2013  . Nephrolithiasis 01/23/2012  . Lumbar radiculopathy 02/28/2011  . Acute gout of right knee 04/30/2010  . Chronic pain 02/14/2010  . NASH (nonalcoholic steatohepatitis) 11/08/2008  . Obesity 10/27/2008  . Hypertriglyceridemia 10/27/2007  . Uncontrolled type 2 diabetes mellitus with hyperglycemia, with long-term current use of insulin ([place]) 03/12/2006  . Essential hypertension, benign 03/12/2006   PCP:  [facility] [place]:   [id number] [place] [id number]: [phone]: [phone]    Social Determinants of Health (SDOH) Interventions    Readmission Risk Interventions Readmission Risk Prevention Plan 05/28/2019  Transportation Screening Complete  PCP or Specialist Appt within 5-7 Days Complete  Home Care Screening Complete  Medication Review (RN CM) Complete  Some recent data might be hidden

## 2020-04-14 NOTE — Progress Notes (Signed)
Received Pt in bed from PACU. Pt alert and oriented to self only. Noted a scant drainage to R hip dressing, R foot dressing is clean/dry and intact. Vital signs taken and recorded. Pt placed on cardiac monitor, continuous pulse ox but Pt had pulled them all off, Dr. Erlinda Hong updated.  Pt placed on posey hand control mitts but he was able to take both of them off, pulled out 2 PIVs and insists on going home today.  13:35 - Pt now alert and oriented self, time and situation but insist on seeing a man in the room with a backpack. Pt reoriented. Pt eating lunch and appears to be more calm. RN will continue to monitor.

## 2020-04-15 DIAGNOSIS — S72141A Displaced intertrochanteric fracture of right femur, initial encounter for closed fracture: Secondary | ICD-10-CM | POA: Diagnosis not present

## 2020-04-15 DIAGNOSIS — E1165 Type 2 diabetes mellitus with hyperglycemia: Secondary | ICD-10-CM | POA: Diagnosis not present

## 2020-04-15 DIAGNOSIS — R410 Disorientation, unspecified: Secondary | ICD-10-CM

## 2020-04-15 DIAGNOSIS — N390 Urinary tract infection, site not specified: Secondary | ICD-10-CM | POA: Diagnosis not present

## 2020-04-15 LAB — CBC
HCT: 24.2 % — ABNORMAL LOW (ref 39.0–52.0)
Hemoglobin: 8.3 g/dL — ABNORMAL LOW (ref 13.0–17.0)
MCH: 31.8 pg (ref 26.0–34.0)
MCHC: 34.3 g/dL (ref 30.0–36.0)
MCV: 92.7 fL (ref 80.0–100.0)
Platelets: 267 10*3/uL (ref 150–400)
RBC: 2.61 MIL/uL — ABNORMAL LOW (ref 4.22–5.81)
RDW: 14.6 % (ref 11.5–15.5)
WBC: 10.7 10*3/uL — ABNORMAL HIGH (ref 4.0–10.5)
nRBC: 0 % (ref 0.0–0.2)

## 2020-04-15 LAB — BASIC METABOLIC PANEL
Anion gap: 7 (ref 5–15)
BUN: 23 mg/dL (ref 8–23)
CO2: 26 mmol/L (ref 22–32)
Calcium: 8.6 mg/dL — ABNORMAL LOW (ref 8.9–10.3)
Chloride: 101 mmol/L (ref 98–111)
Creatinine, Ser: 1.14 mg/dL (ref 0.61–1.24)
GFR, Estimated: >60 mL/min (ref >60)
Glucose, Bld: 200 mg/dL — ABNORMAL HIGH (ref 70–99)
Potassium: 4.4 mmol/L (ref 3.5–5.1)
Sodium: 134 mmol/L — ABNORMAL LOW (ref 135–145)

## 2020-04-15 LAB — GLUCOSE, CAPILLARY
Glucose-Capillary: 168 mg/dL — ABNORMAL HIGH (ref 70–99)
Glucose-Capillary: 283 mg/dL — ABNORMAL HIGH (ref 70–99)
Glucose-Capillary: 232 mg/dL — ABNORMAL HIGH (ref 70–99)
Glucose-Capillary: 160 mg/dL — ABNORMAL HIGH (ref 70–99)

## 2020-04-15 NOTE — Plan of Care (Signed)
  Problem: Pain Managment: Goal: General experience of comfort will improve Outcome: Progressing   Problem: Safety: Goal: Ability to remain free from injury will improve Outcome: Progressing   Problem: Skin Integrity: Goal: Risk for impaired skin integrity will decrease Outcome: Progressing   

## 2020-04-15 NOTE — Evaluation (Addendum)
Physical Therapy Evaluation Patient Details Name: [name] MRN: [id number] DOB: [birth year] Today's Date: 04/15/2020   History of Present Illness  65 yo male with onset of fall and subsequent intertrochanteric fracture on RLE was taken to surgery for IM nailing.  PMHx:  HTN, DM, diabetic foot ulcer, L transmet amp, back surgery, CTS,  Clinical Impression  Pt was evaluated today for tolerance for mobility on side of bed and to chair.  Pt is unable to stand up and maintain WB limits on RLE, and is confused about his history as reads in the chart.  Given his limited tolerance to move without a great deal of help, recommending SNF placement and continue to work on STS with weight bearing restrictions.  Have messaged MD and PA-C as pt has WBAT on op note and less permission in last note from MD.  Follow for acute PT goals as below.    Follow Up Recommendations SNF    Equipment Recommendations  None recommended by PT    Recommendations for Other Services       Precautions / Restrictions Precautions Precautions: Fall Precaution Comments: L transmet amp Restrictions Weight Bearing Restrictions: Yes RLE Weight Bearing: Touchdown weight bearing RLE Partial Weight Bearing Percentage or Pounds: TDWB      Mobility  Bed Mobility Overal bed mobility: Needs Assistance Bed Mobility: Sit to Supine     Supine to sit: Max assist;+2 for physical assistance;+2 for safety/equipment Sit to supine: Total assist;+2 for physical assistance;+2 for safety/equipment (3 person help due to transfer)   General bed mobility comments: 2 max due to pain and stiffness on R hip    Transfers Overall transfer level: Needs assistance Equipment used: None Transfers: Radiographer, therapeutic Sit to Stand: Max assist;+2 physical assistance;+2 safety/equipment;From elevated surface     Anterior-Posterior transfers: +2 physical assistance;+2 safety/equipment (third person to help slide legs on bed)    General transfer comment: slid with linen to bed and third person to scoot legs for comfort  Ambulation/Gait             General Gait Details: unable  Stairs            Wheelchair Mobility    Modified Rankin (Stroke Patients Only)       Balance Overall balance assessment: Needs assistance Sitting-balance support: Bilateral upper extremity supported;Feet supported Sitting balance-Leahy Scale: Fair Sitting balance - Comments: fair once set   Standing balance support: Bilateral upper extremity supported;During functional activity Standing balance-Leahy Scale: Poor Standing balance comment: unable to keep wgt unloaded on RLE                             Pertinent Vitals/Pain Pain Assessment: Faces Faces Pain Scale: Hurts little more Pain Location: R hip Pain Descriptors / Indicators: Operative site guarding;Guarding;Grimacing Pain Intervention(s): Limited activity within patient's tolerance;Monitored during session;Premedicated before session;Repositioned    Home Living Family/patient expects to be discharged to:: Private residence Living Arrangements: Alone Available Help at Discharge: Family;Friend(s) Type of Home: House Home Access: Stairs to enter     Home Layout: One level Home Equipment: Environmental consultant - 2 wheels;Bedside commode;Wheelchair - manual Additional Comments: pt cannot recall DME    Prior Function Level of Independence: Independent with assistive device(s);Independent (per note pt reported he did not use AD until he fell and hurt his R hip)         Comments: per chart from last year pt reports using wheelchair then but  now is reporting to MD that he used walker     Hand Dominance   Dominant Hand: Right    Extremity/Trunk Assessment   Upper Extremity Assessment Upper Extremity Assessment: Difficult to assess due to impaired cognition    Lower Extremity Assessment Lower Extremity Assessment: Generalized weakness;RLE  deficits/detail RLE Deficits / Details: limited strength due to pain with transition to side of bed    Cervical / Trunk Assessment Cervical / Trunk Assessment: Other exceptions (has back surgery in history)  Communication   Communication: No difficulties  Cognition Arousal/Alertness: Awake/alert Behavior During Therapy: Flat affect Overall Cognitive Status: No family/caregiver present to determine baseline cognitive functioning                                 General Comments: unsure of his PLOF      General Comments General comments (skin integrity, edema, etc.): pt declining back to bed so assisted given how long he has refused to return    Exercises     Assessment/Plan    PT Assessment Patient needs continued PT services  PT Problem List Decreased strength;Decreased range of motion;Decreased activity tolerance;Decreased balance;Decreased mobility;Decreased coordination;Decreased cognition;Decreased safety awareness;Decreased knowledge of use of DME;Cardiopulmonary status limiting activity;Pain;Decreased skin integrity       PT Treatment Interventions DME instruction;Gait training;Functional mobility training;Therapeutic activities;Therapeutic exercise;Balance training;Neuromuscular re-education;Patient/family education    PT Goals (Current goals can be found in the Care Plan section)  Acute Rehab PT Goals Patient Stated Goal: none stated PT Goal Formulation: With patient Time For Goal Achievement: 04/29/20 Potential to Achieve Goals: Fair    Frequency Min 4X/week (pt will benefit from more training given lack of help at home)   Barriers to discharge Inaccessible home environment;Decreased caregiver support home alone with stairs to enter house    Co-evaluation               AM-PAC PT "6 Clicks" Mobility  Outcome Measure Help needed turning from your back to your side while in a flat bed without using bedrails?: A Lot Help needed moving from  lying on your back to sitting on the side of a flat bed without using bedrails?: Total Help needed moving to and from a bed to a chair (including a wheelchair)?: Total Help needed standing up from a chair using your arms (e.g., wheelchair or bedside chair)?: Total Help needed to walk in hospital room?: Total Help needed climbing 3-5 steps with a railing? : Total 6 Click Score: 7    End of Session Equipment Utilized During Treatment: Gait belt Activity Tolerance: Treatment limited secondary to medical complications (Comment);Patient limited by fatigue;Patient limited by pain Patient left: in chair;with call bell/phone within reach;with chair alarm set Nurse Communication: Mobility status;Other (comment) (need to use lift to return to bed) PT Visit Diagnosis: Unsteadiness on feet (R26.81);Muscle weakness (generalized) (M62.81);Pain Pain - Right/Left: Right Pain - part of body: Hip    Time: 1610-9604 PT Time Calculation (min) (ACUTE ONLY): 21 min   Charges:   PT Evaluation $PT Eval Moderate Complexity: 1 Mod PT Treatments $Therapeutic Activity: 8-22 mins       Ramond Dial 04/15/2020, 6:56 PM Mee Hives, PT MS Acute Rehab Dept. Number: [place]

## 2020-04-15 NOTE — Progress Notes (Signed)
Patient ID: [name], male   DOB: [birth year], 65 y.o.   MRN: [id number] Patient is postoperative day 1 intramedullary fixation right intertrochanteric hip fracture.  He is resting comfortably.  Plan for physical therapy touchdown weightbearing on the right.  Did have a large blister abscess on the plantar aspect of his foot that was decompressed after surgery.  This appears like it should heal well with his protected weightbearing.  Therapy to determine if patient will require skilled nursing for discharge.

## 2020-04-15 NOTE — Progress Notes (Signed)
PROGRESS NOTE    [name]  FIE:[id number] DOB: February 02, 1955 DOA: 04/12/2020 PCP: [facility]    Chief Complaint  Patient presents with  . Fall    Brief Narrative:  [name]  is a 65 y.o. male, with history of hypertension, diabetic foot ulcer, diabetes mellitus type 2, and more presents the ED with a chief complaint of fall.  Patient reports 3 or 4 days ago he was walking in a parking lot and suffered a mechanical fall Since then he has been having pain with weightbearing on the right leg, he presented to [facility], ER found to have intertrochanteric  fracture of right hip, is transferred to [facility] for surgery  Subjective:  POD#1, he is sitting up on chair, does not appear in distress, he is confused about the months, know the year and place Currently no hallucination  Reports right leg and right foot pain  Assessment & Plan:   Active Problems:   Chronic pain   Uncontrolled type 2 diabetes mellitus with hyperglycemia, with long-term current use of insulin (HCC)   Essential hypertension, benign   Fx intertrochanteric hip (HCC)   Acute lower UTI   Anxiety   Agitation   Closed intertrochanteric fracture of right hip, initial encounter (HCC)  Right hip fracture -Report from mechanical fall -Status post IM nailing right hip by [name] on 4/1 -Postop DVT prophylaxis , wound care , weightbearing status pain control per Ortho , appreciate Ortho input  Diabetic foot ulcer, right foot between second and third toe Irrigated in the OR, now wrapped Management per Ortho  UTI Urine culture (obtained after abx) + 50,000 enterobacter aerogenes,  he started on Rocephin since admission, continue, follow urine culture  Hypertension Increase beta-blocker due to tachycardia and hypertension, continue Norvasc and lisinopril  Insulin-dependent type 2 diabetes,  A1c 7.2 A.m. blood glucose 223 Continue Levemir and SSI  Per report patient has  anxiety and aggressive behavior, has Haldol and Xanax as needed, per previous note there is a concern of alcohol withdrawal  Chronic pain On oxycodone 15 mg q4hrs prn  at home  Nutritional Assessment: The patient's BMI is: Body mass index is 30.29 kg/m.[place] Kitchen    Unresulted Labs (From admission, onward)          Start     Ordered   04/15/20 0500  CBC  Daily,   R      04/14/20 1141   04/15/20 1660  Basic metabolic panel  Daily,   R      04/14/20 1141            DVT prophylaxis: SCDs Start: 04/14/20 1142 heparin injection 5,000 Units Start: 04/13/20 0600 SCDs Start: 04/13/20 0408   Code Status: DNR Family Communication: called son and left a message on 4/1, called daughter at [phone] , not able to reach her, will try tomorrow Disposition:   Status is: Inpatient  Dispo: The patient is from: home              Anticipated d/c is to: [facility] vs SNF              Anticipated d/c date is: TBD, needs Ortho clearance                Consultants:   Orthopedics  Procedures:   IM nailing right hip  Irrigation right foot ulcer  Antimicrobials:    Anti-infectives (From admission, onward)   Start     [employer]  Frequency Ordered Stop   04/14/20 1600  ceFAZolin (ANCEF) IVPB 2g/100 mL premix        2 g 200 mL/hr over 30 Minutes Intravenous Every 6 hours 04/14/20 1141 04/14/20 2142   04/14/20 0815  ceFAZolin (ANCEF) IVPB 2g/100 mL premix        2 g 200 mL/hr over 30 Minutes Intravenous On call to O.R. 04/13/20 1835 04/14/20 1033   04/13/20 0415  cefTRIAXone (ROCEPHIN) 1 g in sodium chloride 0.9 % 100 mL IVPB        1 g 200 mL/hr over 30 Minutes Intravenous Every 24 hours 04/13/20 0407           Objective: Vitals:   04/14/20 1952 04/15/20 0000 04/15/20 0300 04/15/20 0818  BP: 128/74 130/76 132/75 123/70  Pulse: 97 99 (!) 107 100  Resp: 17 18 18 18   Temp: 97.9 F (36.6 C) 98 F (36.7 C) 98 F (36.7 C) 98.2 F (36.8 C)  TempSrc: Oral Oral Oral   SpO2: 94% 96%  95% 96%  Weight:        Intake/Output Summary (Last 24 hours) at 04/15/2020 1501 Last data filed at 04/15/2020 1300 Gross per 24 hour  Intake 1494.47 ml  Output 250 ml  Net 1244.47 ml   Filed Weights   04/12/20 2118  Weight: 107 kg    Examination:  General exam: less confused, no hallucination during encounter today Respiratory system: Clear to auscultation. Respiratory effort normal. Cardiovascular system: S1 & S2 heard, RRR. No JVD, no murmur, No pedal edema. Gastrointestinal system: Abdomen is nondistended, soft and nontender. Normal bowel sounds heard. Central nervous system: not oriented to the month Extremities: prior left foot partial amputation , right foot wrapped, right hip post op changes Skin: right foot diabetic ulcer, extensive tattoo  Psychiatry: less confused , currently no agitation    Data Reviewed: I have personally reviewed following labs and imaging studies  CBC: Recent Labs  Lab 04/12/20 2320 04/13/20 0549 04/15/20 0130  WBC 10.5 8.5 10.7*  NEUTROABS 8.8* 6.6  --   HGB 9.6* 8.9* 8.3*  HCT 27.9* 25.8* 24.2*  MCV 91.5 90.8 92.7  PLT 235 211 503    Basic Metabolic Panel: Recent Labs  Lab 04/12/20 2320 04/13/20 0549 04/13/20 1053 04/15/20 0130  NA 133* 134* 135 134*  K 4.1 3.7 3.7 4.4  CL 98 100 102 101  CO2 23 24 25 26   GLUCOSE 236* 223* 208* 200*  BUN 21 21 22 23   CREATININE 1.10 0.97 0.97 1.14  CALCIUM 8.7* 8.6* 8.7* 8.6*  MG  --  1.8 1.9  --   PHOS  --   --  3.2  --     GFR: CrCl cannot be calculated (Unknown ideal weight.).  Liver Function Tests: Recent Labs  Lab 04/13/20 0549 04/13/20 1053  AST 21 19  ALT 17 16  ALKPHOS 62 61  BILITOT 1.4* 1.0  PROT 7.0 7.0  ALBUMIN 3.0* 2.9*    CBG: Recent Labs  Lab 04/14/20 1152 04/14/20 1649 04/14/20 2008 04/15/20 0647 04/15/20 1214  GLUCAP 183* 262* 248* 168* 283*     Recent Results (from the past 240 hour(s))  SARS CORONAVIRUS 2 (TAT 6-24 HRS) Nasopharyngeal  Nasopharyngeal Swab     Status: None   Collection Time: 04/13/20  1:50 AM   Specimen: Nasopharyngeal Swab  Result Value Ref Range Status   SARS Coronavirus 2 NEGATIVE NEGATIVE Final    Comment: (NOTE) SARS-CoV-2 target nucleic acids are NOT  DETECTED.  The SARS-CoV-2 RNA is generally detectable in upper and lower respiratory specimens during the acute phase of infection. Negative results do not preclude SARS-CoV-2 infection, do not rule out co-infections with other pathogens, and should not be used as the sole basis for treatment or other patient management decisions. Negative results must be combined with clinical observations, patient history, and epidemiological information. The expected result is Negative.  Fact Sheet for Patients: SugarRoll.be  Fact Sheet for Healthcare Providers: https://www.woods-mathews.com/  This test is not yet approved or cleared by the [place] FDA and  has been authorized for detection and/or diagnosis of SARS-CoV-2 by FDA under an Emergency Use Authorization (EUA). This EUA will remain  in effect (meaning this test can be used) for the duration of the COVID-19 declaration under Se ction 564(b)(1) of the Act, 21 U.S.C. section 360bbb-3(b)(1), unless the authorization is terminated or revoked sooner.  Performed at [facility], Home [address], [place], [place] [address]   Surgical pcr screen     Status: Abnormal   Collection Time: 04/14/20  8:29 AM   Specimen: Nasal Mucosa; Nasal Swab  Result Value Ref Range Status   MRSA, PCR NEGATIVE NEGATIVE Final   Staphylococcus aureus POSITIVE (A) NEGATIVE Final    Comment: (NOTE) The Xpert SA Assay (FDA approved for NASAL specimens in patients 53 years of age and older), is one component of a comprehensive surveillance program. It is not intended to diagnose infection nor to guide or monitor treatment. Performed at [facility], [place] [address], [place], [place] [address]   Culture, Urine     Status: Abnormal (Preliminary result)   Collection Time: 04/14/20 12:30 PM   Specimen: Urine, Random  Result Value Ref Range Status   Specimen Description URINE, RANDOM  Final   Special Requests NONE  Final   Culture (A)  Final    50,000 COLONIES/mL ENTEROBACTER AEROGENES SUSCEPTIBILITIES TO FOLLOW Performed at [facility], [place] [address], [place], [place] [address]    Report Status PENDING  Incomplete         Radiology Studies: DG C-Arm 1-60 Min  Result Date: 04/14/2020 CLINICAL DATA:  Trauma. Additional history provided: Surgery, right hip IM nail (short length). Provided fluoroscopy time 1 minutes (23.12 mGy). EXAM: OPERATIVE right HIP (WITH PELVIS IF PERFORMED) 2 VIEWS TECHNIQUE: Fluoroscopic spot image(s) were submitted for interpretation post-operatively. COMPARISON:  Radiographs of the right hip 04/12/2020. FINDINGS: Two intraoperative fluoroscopic images of the right hip are submitted. The images demonstrate an intramedullary nail within the proximal right femur. An interlocking component extends into the femoral head and neck. Hardware traverses a known intertrochanteric femur fracture. Persistent although improved displacement at the fracture site. There is an additional interlocking screw within the femoral shaft. Redemonstrated known associated avulsion fracture of the lesser trochanter. IMPRESSION: Two intraoperative fluoroscopic images of the right hip, as described. Electronically Signed   By: Kellie Simmering DO   On: 04/14/2020 11:36   DG HIP OPERATIVE UNILAT WITH PELVIS RIGHT  Result Date: 04/14/2020 CLINICAL DATA:  Trauma. Additional history provided: Surgery, right hip IM nail (short length). Provided fluoroscopy time 1 minutes (23.12 mGy). EXAM: OPERATIVE right HIP (WITH PELVIS IF PERFORMED) 2 VIEWS TECHNIQUE: Fluoroscopic spot image(s) were submitted for interpretation post-operatively. COMPARISON:  Radiographs of the  right hip 04/12/2020. FINDINGS: Two intraoperative fluoroscopic images of the right hip are submitted. The images demonstrate an intramedullary nail within the proximal right femur. An interlocking component extends into  the femoral head and neck. Hardware traverses a known intertrochanteric femur fracture. Persistent although improved displacement at the fracture site. There is an additional interlocking screw within the femoral shaft. Redemonstrated known associated avulsion fracture of the lesser trochanter. IMPRESSION: Two intraoperative fluoroscopic images of the right hip, as described. Electronically Signed   By: Kellie Simmering DO   On: 04/14/2020 11:36        Scheduled Meds: . amitriptyline  25 mg Oral QHS  . amLODipine  5 mg Oral Daily  . aspirin EC  81 mg Oral QODAY  . docusate sodium  100 mg Oral BID  . folic acid  1 mg Oral Daily  . heparin  5,000 Units Subcutaneous Q8H  . insulin aspart  0-20 Units Subcutaneous TID WC  . insulin aspart  0-5 Units Subcutaneous QHS  . insulin detemir  50 Units Subcutaneous QHS  . lisinopril  40 mg Oral Daily  . metoprolol succinate  125 mg Oral Daily  . multivitamin with minerals  1 tablet Oral Daily  . pentoxifylline  400 mg Oral TID WC  . thiamine  100 mg Oral Daily   Or  . thiamine  100 mg Intravenous Daily   Continuous Infusions: . sodium chloride 75 mL/hr at 04/14/20 2200  . cefTRIAXone (ROCEPHIN)  IV 1 g (04/15/20 0331)     LOS: 2 days   Time spent:25 mins Greater than 50% of this time was spent in counseling, explanation of diagnosis, planning of further management, and coordination of care.   Voice Recognition Viviann Spare dictation system was used to create this note, attempts have been made to correct errors. Please contact the author with questions and/or clarifications.   Florencia Reasons, MD PhD FACP [facility]  Available via Epic secure chat 7am-7pm for nonurgent issues Please page for urgent issues To page the attending  provider between 7A-7P or the covering provider during after hours 7P-7A, please log into the web site [email or web address] and access using universal  password for that web site. If you do not have the password, please call the hospital operator.    04/15/2020, 3:01 PM

## 2020-04-15 NOTE — Progress Notes (Signed)
   04/15/20 1120  OTHER  Substance Abuse Education Offered No  (CAGE-AID) Substance Abuse Screening Tool  Have You Ever Felt You Ought to Cut Down on Your Drinking or Drug Use? 0  Have People Annoyed You By Critizing Your Drinking Or Drug Use? 0  Have You Felt Bad Or Guilty About Your Drinking Or Drug Use? 0  Have You Ever Had a Drink or Used Drugs First Thing In The Morning to Steady Your Nerves or to Get Rid of a Hangover? 0  CAGE-AID Score 0

## 2020-04-15 NOTE — Progress Notes (Signed)
Physical Therapy Treatment Patient Details Name: [name] MRN: [id number] DOB: 1955-04-23 Today's Date: 04/15/2020    History of Present Illness 65 yo male with onset of fall and subsequent intertrochanteric fracture on RLE was taken to surgery for IM nailing.  PMHx:  HTN, DM, diabetic foot ulcer, L transmet amp, back surgery, CTS,    PT Comments    Worked with nursing this PM for unplanned second visit as pt is still in chair and wanted to instruct AP transfer to bed.  Pt is able to slide fairly easily with linen to bed with another nurse to help keep his legs moving, and pivoted to bed.  Pt seems unable to determine a limited time for being up, and will recommend setting a time frame to return him to bed.  Continue to focus on STS with pt for next visit, working on trying to use LLE to push up but is a struggle with his transmet amp and limited flexion in L knee.  Follow along as tolerated.  Follow Up Recommendations  SNF     Equipment Recommendations  None recommended by PT    Recommendations for Other Services       Precautions / Restrictions Precautions Precautions: Fall Precaution Comments: L transmet amp Restrictions Weight Bearing Restrictions: Yes RLE Weight Bearing: Touchdown weight bearing RLE Partial Weight Bearing Percentage or Pounds: TDWB    Mobility  Bed Mobility Overal bed mobility: Needs Assistance Bed Mobility: Sit to Supine     Supine to sit: Max assist;+2 for physical assistance;+2 for safety/equipment Sit to supine: Total assist;+2 for physical assistance;+2 for safety/equipment (3 person help due to transfer)   General bed mobility comments: 2 max due to pain and stiffness on R hip    Transfers Overall transfer level: Needs assistance Equipment used: None Transfers: Radiographer, therapeutic Sit to Stand: Max assist;+2 physical assistance;+2 safety/equipment;From elevated surface     Anterior-Posterior transfers: +2 physical  assistance;+2 safety/equipment (third person to help slide legs on bed)   General transfer comment: slid with linen to bed and third person to scoot legs for comfort  Ambulation/Gait             General Gait Details: unable   Stairs             Wheelchair Mobility    Modified Rankin (Stroke Patients Only)       Balance Overall balance assessment: Needs assistance Sitting-balance support: Bilateral upper extremity supported;Feet supported Sitting balance-Leahy Scale: Fair Sitting balance - Comments: fair once set   Standing balance support: Bilateral upper extremity supported;During functional activity Standing balance-Leahy Scale: Poor Standing balance comment: unable to keep wgt unloaded on RLE                            Cognition Arousal/Alertness: Awake/alert Behavior During Therapy: Flat affect Overall Cognitive Status: No family/caregiver present to determine baseline cognitive functioning                                 General Comments: unsure of his PLOF      Exercises      General Comments General comments (skin integrity, edema, etc.): pt declining back to bed so assisted given how long he has refused to return      Pertinent Vitals/Pain Pain Assessment: Faces Faces Pain Scale: Hurts little more Pain Location: R hip Pain Descriptors / Indicators:  Operative site guarding;Guarding;Grimacing Pain Intervention(s): Limited activity within patient's tolerance;Monitored during session;Premedicated before session;Repositioned    Home Living Family/patient expects to be discharged to:: Private residence Living Arrangements: Alone Available Help at Discharge: Family;Friend(s) Type of Home: House Home Access: Stairs to enter   Home Layout: One level Home Equipment: Environmental consultant - 2 wheels;Bedside commode;Wheelchair - manual Additional Comments: pt cannot recall DME    Prior Function Level of Independence: Independent with  assistive device(s);Independent (per note pt reported he did not use AD until he fell and hurt his R hip)      Comments: per chart from last year pt reports using wheelchair then but now is reporting to MD that he used walker   PT Goals (current goals can now be found in the care plan section) Acute Rehab PT Goals Patient Stated Goal: none stated PT Goal Formulation: With patient Time For Goal Achievement: 04/29/20 Potential to Achieve Goals: Fair    Frequency    Min 4X/week (pt will benefit from more training given lack of help at home)      PT Plan      Co-evaluation              AM-PAC PT "6 Clicks" Mobility   Outcome Measure  Help needed turning from your back to your side while in a flat bed without using bedrails?: A Lot Help needed moving from lying on your back to sitting on the side of a flat bed without using bedrails?: Total Help needed moving to and from a bed to a chair (including a wheelchair)?: Total Help needed standing up from a chair using your arms (e.g., wheelchair or bedside chair)?: Total Help needed to walk in hospital room?: Total Help needed climbing 3-5 steps with a railing? : Total 6 Click Score: 7    End of Session Equipment Utilized During Treatment: Gait belt Activity Tolerance: Treatment limited secondary to medical complications (Comment);Patient limited by fatigue;Patient limited by pain Patient left: in chair;with call bell/phone within reach;with chair alarm set Nurse Communication: Mobility status;Other (comment) (need to use lift to return to bed) PT Visit Diagnosis: Unsteadiness on feet (R26.81);Muscle weakness (generalized) (M62.81);Pain Pain - Right/Left: Right Pain - part of body: Hip     Time: 6503-5465 PT Time Calculation (min) (ACUTE ONLY): 21 min  Charges:  $Therapeutic Activity: 8-22 mins                     Ramond Dial 04/15/2020, 6:41 PM Mee Hives, PT MS Acute Rehab Dept. Number: [place]

## 2020-04-16 DIAGNOSIS — N390 Urinary tract infection, site not specified: Secondary | ICD-10-CM | POA: Diagnosis not present

## 2020-04-16 DIAGNOSIS — E1165 Type 2 diabetes mellitus with hyperglycemia: Secondary | ICD-10-CM | POA: Diagnosis not present

## 2020-04-16 DIAGNOSIS — S72141A Displaced intertrochanteric fracture of right femur, initial encounter for closed fracture: Secondary | ICD-10-CM | POA: Diagnosis not present

## 2020-04-16 DIAGNOSIS — R410 Disorientation, unspecified: Secondary | ICD-10-CM | POA: Diagnosis not present

## 2020-04-16 LAB — GLUCOSE, CAPILLARY
Glucose-Capillary: 126 mg/dL — ABNORMAL HIGH (ref 70–99)
Glucose-Capillary: 218 mg/dL — ABNORMAL HIGH (ref 70–99)
Glucose-Capillary: 206 mg/dL — ABNORMAL HIGH (ref 70–99)
Glucose-Capillary: 230 mg/dL — ABNORMAL HIGH (ref 70–99)

## 2020-04-16 LAB — MAGNESIUM: Magnesium: 1.9 mg/dL (ref 1.7–2.4)

## 2020-04-16 LAB — BASIC METABOLIC PANEL
Anion gap: 7 (ref 5–15)
BUN: 22 mg/dL (ref 8–23)
CO2: 25 mmol/L (ref 22–32)
Calcium: 8.7 mg/dL — ABNORMAL LOW (ref 8.9–10.3)
Chloride: 105 mmol/L (ref 98–111)
Creatinine, Ser: 1.05 mg/dL (ref 0.61–1.24)
GFR, Estimated: >60 mL/min (ref >60)
Glucose, Bld: 145 mg/dL — ABNORMAL HIGH (ref 70–99)
Potassium: 4.2 mmol/L (ref 3.5–5.1)
Sodium: 137 mmol/L (ref 135–145)

## 2020-04-16 LAB — URINE CULTURE: Culture: 50000 — AB

## 2020-04-16 LAB — CBC
HCT: 24.1 % — ABNORMAL LOW (ref 39.0–52.0)
Hemoglobin: 8.3 g/dL — ABNORMAL LOW (ref 13.0–17.0)
MCH: 31.4 pg (ref 26.0–34.0)
MCHC: 34.4 g/dL (ref 30.0–36.0)
MCV: 91.3 fL (ref 80.0–100.0)
Platelets: 264 10*3/uL (ref 150–400)
RBC: 2.64 MIL/uL — ABNORMAL LOW (ref 4.22–5.81)
RDW: 14.6 % (ref 11.5–15.5)
WBC: 9.6 10*3/uL (ref 4.0–10.5)
nRBC: 0 % (ref 0.0–0.2)

## 2020-04-16 MED ORDER — SENNOSIDES-DOCUSATE SODIUM 8.6-50 MG PO TABS
1.0000 | ORAL_TABLET | Freq: Two times a day (BID) | ORAL | Status: DC
  Administered 2020-04-16 – 2020-04-20 (×8): 1 via ORAL
  Filled 2020-04-16 (×8): qty 1

## 2020-04-16 MED ORDER — SULFAMETHOXAZOLE-TRIMETHOPRIM 800-160 MG PO TABS
1.0000 | ORAL_TABLET | Freq: Two times a day (BID) | ORAL | Status: DC
  Administered 2020-04-16 – 2020-04-20 (×8): 1 via ORAL
  Filled 2020-04-16 (×8): qty 1

## 2020-04-16 NOTE — Progress Notes (Signed)
PROGRESS NOTE    [name]  IOE:[id number] DOB: [birth year] DOA: 04/12/2020 PCP: [facility]    Chief Complaint  Patient presents with  . Fall    Brief Narrative:  [name]  is a 65 y.o. male, with history of hypertension, diabetic foot ulcer, diabetes mellitus type 2, and more presents the ED with a chief complaint of fall.  Patient reports 3 or 4 days ago he was walking in a parking lot and suffered a mechanical fall Since then he has been having pain with weightbearing on the right leg, he presented to [facility], ER found to have intertrochanteric  fracture of right hip, is transferred to [facility] for [facility]  Subjective:  POD#2, confusion appears has resolved, he is calm and aaox3  Reports right leg and right foot pain but has improved   Assessment & Plan:   Active Problems:   Chronic pain   Uncontrolled type 2 diabetes mellitus with hyperglycemia, with long-term current use of insulin (HCC)   Essential hypertension, benign   Fx intertrochanteric hip (HCC)   Acute lower UTI   Anxiety   Agitation   Closed intertrochanteric fracture of right hip, initial encounter ([place])  Right hip fracture -Report from mechanical fall -Status post IM nailing right hip by [name] on 4/1 -Postop DVT prophylaxis , wound care , weightbearing status pain control per Ortho , appreciate Ortho input  Diabetic foot ulcer, right foot between second and third toe Irrigated in the OR, now wrapped Management per Ortho  UTI ? Urine culture (obtained after abx) + 50,000 enterobacter aerogenes resistance to rocephin D/c rocephin  Acute metabolic encephalopathy -patient was very confused and agitation on presentation to [place] [facility] per chart review, initially there was a concern for alcohol withdrawal , UDS was not ordered at the time -per daughter patient gets very confused when he has infection in the past -daughter reports patient does not drink alcohol,  does not use illicit drug other than narcotics that is prescribed -patient appears much improved today, will start him on bactrim for 5 days empiric cover for possible infection ( diabetic foot ulcer vs UTI)  Hypertension Increase beta-blocker due to tachycardia and hypertension, continue Norvasc and lisinopril  Insulin-dependent type 2 diabetes,  A1c 7.2 A.m. blood glucose 223 Continue Levemir and SSI  Normocytic anemia Appear chronic, anemia work-up, follow-up with PCP  Chronic pain On oxycodone 15 mg q4hrs prn  at home  Nutritional Assessment: The patient's BMI is: Body mass index is 30.29 kg/m.Marland Kitchen    Unresulted Labs (From admission, onward)          Start     Ordered   04/17/20 1829  Basic metabolic panel  Tomorrow morning,   R        04/16/20 1535   04/17/20 0500  Magnesium  Tomorrow morning,   R        04/16/20 1535   04/17/20 0500  TSH  Tomorrow morning,   R        04/16/20 1538   04/17/20 0500  Folate, serum, performed at [place] morning,   R        04/16/20 1538   04/17/20 0500  Vitamin B12  Tomorrow morning,   R        04/16/20 1538   04/17/20 0500  Iron and TIBC  Tomorrow morning,   R        04/16/20 1538   04/17/20 0500  Ferritin  Tomorrow morning,   R        04/16/20 1538   04/15/20 0500  CBC  Daily,   R      04/14/20 1141            DVT prophylaxis: SCDs Start: 04/14/20 1142 heparin injection 5,000 Units Start: 04/13/20 0600 SCDs Start: 04/13/20 0408   Code Status: DNR Family Communication:  Daughter [name]  at [phone] on 4/3 Disposition:   Status is: Inpatient  Dispo: The patient is from: home              Anticipated d/c is to: [facility] vs SNF              Anticipated d/c date is: likely will be medically stable to discharge on monday                Consultants:   Orthopedics  Procedures:   IM nailing right hip on 4/1  Irrigation right foot ulcer on 4/1  Antimicrobials:    Anti-infectives (From  admission, onward)   Start     Dose/Rate Route Frequency Ordered Stop   04/16/20 2200  sulfamethoxazole-trimethoprim (BACTRIM DS) 800-160 MG per tablet 1 tablet        1 tablet Oral Every 12 hours 04/16/20 1530 04/21/20 2159   04/14/20 1600  ceFAZolin (ANCEF) IVPB 2g/100 mL premix        2 g 200 mL/hr over 30 Minutes Intravenous Every 6 hours 04/14/20 1141 04/14/20 2142   04/14/20 0815  ceFAZolin (ANCEF) IVPB 2g/100 mL premix        2 g 200 mL/hr over 30 Minutes Intravenous On call to O.R. 04/13/20 1835 04/14/20 1033   04/13/20 0415  cefTRIAXone (ROCEPHIN) 1 g in sodium chloride 0.9 % 100 mL IVPB  Status:  Discontinued        1 g 200 mL/hr over 30 Minutes Intravenous Every 24 hours 04/13/20 0407 04/16/20 1528         Objective: Vitals:   04/15/20 1943 04/16/20 0000 04/16/20 0400 04/16/20 0821  BP: 131/78 133/72 140/70 (!) 124/95  Pulse: 94 (!) 101 98 (!) 102  Resp: 18 18 17 19   Temp: 98.6 F (37 C) 98.6 F (37 C) 98.2 F (36.8 C) 98.5 F (36.9 C)  TempSrc: Oral Oral Oral   SpO2: 99% 97% 100% 98%  Weight:        Intake/Output Summary (Last 24 hours) at 04/16/2020 1538 Last data filed at 04/16/2020 0900 Gross per 24 hour  Intake 360 ml  Output 600 ml  Net -240 ml   Filed Weights   04/12/20 2118  Weight: 107 kg    Examination:  General exam: calm and cooperative, aaox3 today, no hallucination  Respiratory system: Clear to auscultation. Respiratory effort normal. Cardiovascular system: S1 & S2 heard, RRR. No JVD, no murmur, No pedal edema. Gastrointestinal system: Abdomen is nondistended, soft and nontender. Normal bowel sounds heard. Central nervous system: not oriented to the month Extremities: prior left foot partial amputation , right foot wrapped, right hip post op changes Skin: right foot diabetic ulcer, extensive tattoo  Psychiatry: less confused , currently no agitation    Data Reviewed: I have personally reviewed following labs and imaging  studies  CBC: Recent Labs  Lab 04/12/20 2320 04/13/20 0549 04/15/20 0130 04/16/20 0314  WBC 10.5 8.5 10.7* 9.6  NEUTROABS 8.8* 6.6  --   --   HGB 9.6* 8.9* 8.3* 8.3*  HCT 27.9* 25.8*  24.2* 24.1*  MCV 91.5 90.8 92.7 91.3  PLT 235 211 267 638    Basic Metabolic Panel: Recent Labs  Lab 04/12/20 2320 04/13/20 0549 04/13/20 1053 04/15/20 0130 04/16/20 0314  NA 133* 134* 135 134* 137  K 4.1 3.7 3.7 4.4 4.2  CL 98 100 102 101 105  CO2 23 24 25 26 25   GLUCOSE 236* 223* 208* 200* 145*  BUN 21 21 22 23 22   CREATININE 1.10 0.97 0.97 1.14 1.05  CALCIUM 8.7* 8.6* 8.7* 8.6* 8.7*  MG  --  1.8 1.9  --  1.9  PHOS  --   --  3.2  --   --     GFR: CrCl cannot be calculated (Unknown ideal weight.).  Liver Function Tests: Recent Labs  Lab 04/13/20 0549 04/13/20 1053  AST 21 19  ALT 17 16  ALKPHOS 62 61  BILITOT 1.4* 1.0  PROT 7.0 7.0  ALBUMIN 3.0* 2.9*    CBG: Recent Labs  Lab 04/15/20 1214 04/15/20 1531 04/15/20 2014 04/16/20 0646 04/16/20 1136  GLUCAP 283* 232* 160* 126* 218*     Recent Results (from the past 240 hour(s))  SARS CORONAVIRUS 2 (TAT 6-24 HRS) Nasopharyngeal Nasopharyngeal Swab     Status: None   Collection Time: 04/13/20  1:50 AM   Specimen: Nasopharyngeal Swab  Result Value Ref Range Status   SARS Coronavirus 2 NEGATIVE NEGATIVE Final    Comment: (NOTE) SARS-CoV-2 target nucleic acids are NOT DETECTED.  The SARS-CoV-2 RNA is generally detectable in upper and lower respiratory specimens during the acute phase of infection. Negative results do not preclude SARS-CoV-2 infection, do not rule out co-infections with other pathogens, and should not be used as the sole basis for treatment or other patient management decisions. Negative results must be combined with clinical observations, patient history, and epidemiological information. The expected result is Negative.  Fact Sheet for Patients: SugarRoll.be  Fact  Sheet for Healthcare Providers: https://www.woods-mathews.com/  This test is not yet approved or cleared by the [place] FDA and  has been authorized for detection and/or diagnosis of SARS-CoV-2 by FDA under an Emergency Use Authorization (EUA). This EUA will remain  in effect (meaning this test can be used) for the duration of the COVID-19 declaration under Se ction 564(b)(1) of the Act, 21 U.S.C. section 360bbb-3(b)(1), unless the authorization is terminated or revoked sooner.  Performed at [facility], [place] [address], [place], [place] [address]   Surgical pcr screen     Status: Abnormal   Collection Time: 04/14/20  8:29 AM   Specimen: Nasal Mucosa; Nasal Swab  Result Value Ref Range Status   MRSA, PCR NEGATIVE NEGATIVE Final   Staphylococcus aureus POSITIVE (A) NEGATIVE Final    Comment: (NOTE) The Xpert SA Assay (FDA approved for NASAL specimens in patients 85 years of age and older), is one component of a comprehensive surveillance program. It is not intended to diagnose infection nor to guide or monitor treatment. Performed at [facility], [place] [address], [place], [place] [address]   Culture, Urine     Status: Abnormal   Collection Time: 04/14/20 12:30 PM   Specimen: Urine, Random  Result Value Ref Range Status   Specimen Description URINE, RANDOM  Final   Special Requests   Final    NONE Performed at [facility], [place] [address], [place], [place] [address]    Culture 50,000 COLONIES/mL ENTEROBACTER AEROGENES (A)  Final   Report Status  04/16/2020 FINAL  Final   Organism ID, Bacteria ENTEROBACTER AEROGENES (A)  Final      Susceptibility   Enterobacter aerogenes - MIC*    CEFAZOLIN >=64 RESISTANT Resistant     CEFEPIME 2 SENSITIVE Sensitive     CEFTRIAXONE >=64 RESISTANT Resistant     CIPROFLOXACIN 2 INTERMEDIATE Intermediate     GENTAMICIN <=1 SENSITIVE Sensitive     IMIPENEM 2 SENSITIVE Sensitive     NITROFURANTOIN <=16  SENSITIVE Sensitive     TRIMETH/SULFA <=20 SENSITIVE Sensitive     PIP/TAZO 8 SENSITIVE Sensitive     * 50,000 COLONIES/mL ENTEROBACTER AEROGENES         Radiology Studies: No results found.      Scheduled Meds: . amitriptyline  25 mg Oral QHS  . amLODipine  5 mg Oral Daily  . aspirin EC  81 mg Oral QODAY  . folic acid  1 mg Oral Daily  . heparin  5,000 Units Subcutaneous Q8H  . insulin aspart  0-20 Units Subcutaneous TID WC  . insulin aspart  0-5 Units Subcutaneous QHS  . insulin detemir  50 Units Subcutaneous QHS  . lisinopril  40 mg Oral Daily  . metoprolol succinate  125 mg Oral Daily  . multivitamin with minerals  1 tablet Oral Daily  . pentoxifylline  400 mg Oral TID WC  . senna-docusate  1 tablet Oral BID  . sulfamethoxazole-trimethoprim  1 tablet Oral Q12H  . thiamine  100 mg Oral Daily   Or  . thiamine  100 mg Intravenous Daily   Continuous Infusions:    LOS: 3 days   Time spent:25 mins Greater than 50% of this time was spent in counseling, explanation of diagnosis, planning of further management, and coordination of care.   Voice Recognition Viviann Spare dictation system was used to create this note, attempts have been made to correct errors. Please contact the author with questions and/or clarifications.   Florencia Reasons, MD PhD FACP [facility]  Available via Epic secure chat 7am-7pm for nonurgent issues Please page for urgent issues To page the attending provider between 7A-7P or the covering provider during after hours 7P-7A, please log into the web site [email or web address] and access using universal [place] password for that web site. If you do not have the password, please call the hospital operator.    04/16/2020, 3:38 PM

## 2020-04-16 NOTE — Plan of Care (Signed)
  Problem: Pain Managment: Goal: General experience of comfort will improve Outcome: Progressing   Problem: Safety: Goal: Ability to remain free from injury will improve Outcome: Progressing   

## 2020-04-16 NOTE — Anesthesia Postprocedure Evaluation (Signed)
Anesthesia Post Note  Patient: [place]  Procedure(s) Performed: INTRAMEDULLARY (IM) NAIL INTERTROCHANTRIC (Right Leg Upper)     Patient location during evaluation: PACU Anesthesia Type: General Level of consciousness: awake and alert Pain management: pain level controlled Vital Signs Assessment: post-procedure vital signs reviewed and stable Respiratory status: spontaneous breathing, nonlabored ventilation, respiratory function stable and patient connected to nasal cannula oxygen Cardiovascular status: blood pressure returned to baseline and stable Postop Assessment: no apparent nausea or vomiting Anesthetic complications: no   No complications documented.  Last Vitals:  Vitals:   04/16/20 0821 04/16/20 1603  BP: (!) 124/95 113/79  Pulse: (!) 102 83  Resp: 19 18  Temp: 36.9 C 36.8 C  SpO2: 98% 99%    Last Pain:  Vitals:   04/16/20 1512  TempSrc:   PainSc: Asleep                 Massa Pe

## 2020-04-17 ENCOUNTER — Encounter (HOSPITAL_COMMUNITY): Payer: Self-pay | Admitting: Family Medicine

## 2020-04-17 DIAGNOSIS — S72141A Displaced intertrochanteric fracture of right femur, initial encounter for closed fracture: Secondary | ICD-10-CM | POA: Diagnosis not present

## 2020-04-17 DIAGNOSIS — R410 Disorientation, unspecified: Secondary | ICD-10-CM | POA: Diagnosis not present

## 2020-04-17 DIAGNOSIS — E1165 Type 2 diabetes mellitus with hyperglycemia: Secondary | ICD-10-CM | POA: Diagnosis not present

## 2020-04-17 DIAGNOSIS — N390 Urinary tract infection, site not specified: Secondary | ICD-10-CM | POA: Diagnosis not present

## 2020-04-17 LAB — BASIC METABOLIC PANEL
Anion gap: 8 (ref 5–15)
BUN: 22 mg/dL (ref 8–23)
CO2: 25 mmol/L (ref 22–32)
Calcium: 8.6 mg/dL — ABNORMAL LOW (ref 8.9–10.3)
Chloride: 102 mmol/L (ref 98–111)
Creatinine, Ser: 1.09 mg/dL (ref 0.61–1.24)
GFR, Estimated: >60 mL/min (ref >60)
Glucose, Bld: 135 mg/dL — ABNORMAL HIGH (ref 70–99)
Potassium: 3.8 mmol/L (ref 3.5–5.1)
Sodium: 135 mmol/L (ref 135–145)

## 2020-04-17 LAB — IRON AND TIBC
Iron: 29 ug/dL — ABNORMAL LOW (ref 45–182)
Saturation Ratios: 12 % — ABNORMAL LOW (ref 17.9–39.5)
TIBC: 238 ug/dL — ABNORMAL LOW (ref 250–450)
UIBC: 209 ug/dL

## 2020-04-17 LAB — CBC
HCT: 24.3 % — ABNORMAL LOW (ref 39.0–52.0)
Hemoglobin: 8.2 g/dL — ABNORMAL LOW (ref 13.0–17.0)
MCH: 31.2 pg (ref 26.0–34.0)
MCHC: 33.7 g/dL (ref 30.0–36.0)
MCV: 92.4 fL (ref 80.0–100.0)
Platelets: 311 10*3/uL (ref 150–400)
RBC: 2.63 MIL/uL — ABNORMAL LOW (ref 4.22–5.81)
RDW: 14.7 % (ref 11.5–15.5)
WBC: 9.5 10*3/uL (ref 4.0–10.5)
nRBC: 0 % (ref 0.0–0.2)

## 2020-04-17 LAB — FOLATE: Folate: 16.9 ng/mL (ref >5.9)

## 2020-04-17 LAB — VITAMIN B12: Vitamin B-12: 175 pg/mL — ABNORMAL LOW (ref 180–914)

## 2020-04-17 LAB — GLUCOSE, CAPILLARY
Glucose-Capillary: 129 mg/dL — ABNORMAL HIGH (ref 70–99)
Glucose-Capillary: 187 mg/dL — ABNORMAL HIGH (ref 70–99)
Glucose-Capillary: 200 mg/dL — ABNORMAL HIGH (ref 70–99)
Glucose-Capillary: 279 mg/dL — ABNORMAL HIGH (ref 70–99)

## 2020-04-17 LAB — TSH: TSH: 1.163 u[IU]/mL (ref 0.350–4.500)

## 2020-04-17 LAB — FERRITIN: Ferritin: 165 ng/mL (ref 24–336)

## 2020-04-17 LAB — MAGNESIUM: Magnesium: 1.8 mg/dL (ref 1.7–2.4)

## 2020-04-17 MED ORDER — SULFAMETHOXAZOLE-TRIMETHOPRIM 800-160 MG PO TABS: 1.0000 | ORAL_TABLET | Freq: Two times a day (BID) | ORAL | 0 refills | Status: DC

## 2020-04-17 MED ORDER — FOLIC ACID 1 MG PO TABS: 1.0000 mg | ORAL_TABLET | Freq: Every day | ORAL | 0 refills | Status: AC

## 2020-04-17 MED ORDER — ADULT MULTIVITAMIN W/MINERALS CH: 1.0000 | ORAL_TABLET | Freq: Every day | ORAL | 0 refills | Status: AC

## 2020-04-17 MED ORDER — METOPROLOL SUCCINATE ER 50 MG PO TB24
100.0000 mg | ORAL_TABLET | Freq: Every day | ORAL | Status: DC
  Administered 2020-04-18 – 2020-04-20 (×3): 100 mg via ORAL
  Filled 2020-04-17 (×3): qty 2

## 2020-04-17 MED ORDER — CYANOCOBALAMIN 1000 MCG PO TABS: 1000.0000 ug | ORAL_TABLET | Freq: Every day | ORAL | 0 refills | Status: AC

## 2020-04-17 MED ORDER — THIAMINE HCL 100 MG PO TABS: 100.0000 mg | ORAL_TABLET | Freq: Every day | ORAL | 0 refills | Status: AC

## 2020-04-17 MED ORDER — POLYETHYLENE GLYCOL 3350 17 G PO PACK: 17.0000 g | PACK | Freq: Every day | ORAL | 0 refills | Status: AC | PRN

## 2020-04-17 MED ORDER — VITAMIN B-12 1000 MCG PO TABS
1000.0000 ug | ORAL_TABLET | Freq: Every day | ORAL | Status: DC
  Administered 2020-04-17 – 2020-04-20 (×4): 1000 ug via ORAL
  Filled 2020-04-17 (×4): qty 1

## 2020-04-17 MED ORDER — SENNOSIDES-DOCUSATE SODIUM 8.6-50 MG PO TABS: 1.0000 | ORAL_TABLET | Freq: Every day | ORAL | 0 refills | Status: AC
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
## 2020-04-17 NOTE — TOC Progression Note (Signed)
Transition of Care [facility]) - Progression Note    Patient Details  Name: [name] MRN: [id number] Date of Birth: 1956/01/02  Transition of Care [facility]) CM/SW [place], [place] Phone Number: 04/17/2020, 11:36 AM  Clinical Narrative:     CSW spoke with pt regaurding DC planning and explained the recommendation for SNF at DC. Pt was very adamant he did not want to go, especially since he would have to stay for 30 days and turn over his check. He stated his son was coming to help him and he would like HH. CSW notified case manager to follow up with pt.  Expected Discharge Plan: Skilled Nursing Facility Barriers to Discharge: Continued Medical Work up  Expected Discharge Plan and Services Expected Discharge Plan: [place]                                               Social Determinants of Health (SDOH) Interventions    Readmission Risk Interventions Readmission Risk Prevention Plan 05/28/2019  Transportation Screening Complete  PCP or Specialist Appt within 5-7 Days Complete  Home Care Screening Complete  Medication Review (RN CM) Complete  Some recent data might be hidden

## 2020-04-17 NOTE — TOC Progression Note (Addendum)
Transition of Care [facility]) - Progression Note    Patient Details  Name: [name] MRN: [id number] Date of Birth: [birth year]  [facility]) CM/SW Contact  Sharin Mons, RN Phone Number: 04/17/2020, 5:16 PM  Clinical Narrative:    Pt declining SNF placement. Agreeable to home health services. NCM spoke with pt's daughter [name] by phone to discuss TOC needs after pt had gaven NCM the ok to speak with his children  [name] stated she and brother are 3 hrs away and would not be able to assist with care if d/c to home. NCM unable to secure home health services 2/2 to staffing and out of network... muti agencies called ( [facility], [place], [place], [facility], [facility], [place])   [address] (Son) [name] (Daughter)     [phone]     Per MD psych to evaluate for competency.  TOC team will continue to monitor and assist with needs.  Expected Discharge Plan: [place] Services Barriers to Discharge: Other (comment)  Expected Discharge Plan and Services Expected Discharge Plan: [place]         Expected Discharge Date: 04/17/20                                     Social Determinants of Health (SDOH) Interventions    Readmission Risk Interventions Readmission Risk Prevention Plan 05/28/2019  Transportation Screening Complete  PCP or Specialist Appt within 5-7 Days Complete  Home Care Screening Complete  Medication Review (RN CM) Complete  Some recent data might be hidden
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
## 2020-04-17 NOTE — Plan of Care (Signed)
  Problem: Education: Goal: Knowledge of General Education information will improve Description: Including pain rating scale, medication(s)/side effects and non-pharmacologic comfort measures Outcome: Progressing   Problem: Health Behavior/Discharge Planning: Goal: Ability to manage health-related needs will improve Outcome: Progressing   Problem: Clinical Measurements: Goal: Ability to maintain clinical measurements within normal limits will improve Outcome: Progressing   Problem: Nutrition: Goal: Adequate nutrition will be maintained Outcome: Progressing   Problem: Pain Managment: Goal: General experience of comfort will improve Outcome: Progressing   Problem: Activity: Goal: Risk for activity intolerance will decrease Outcome: Not Progressing
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
## 2020-04-18 DIAGNOSIS — F419 Anxiety disorder, unspecified: Secondary | ICD-10-CM

## 2020-04-18 DIAGNOSIS — S72141A Displaced intertrochanteric fracture of right femur, initial encounter for closed fracture: Secondary | ICD-10-CM | POA: Diagnosis not present

## 2020-04-18 LAB — GLUCOSE, CAPILLARY
Glucose-Capillary: 161 mg/dL — ABNORMAL HIGH (ref 70–99)
Glucose-Capillary: 266 mg/dL — ABNORMAL HIGH (ref 70–99)
Glucose-Capillary: 203 mg/dL — ABNORMAL HIGH (ref 70–99)
Glucose-Capillary: 185 mg/dL — ABNORMAL HIGH (ref 70–99)

## 2020-04-18 MED ORDER — MUPIROCIN 2 % EX OINT
1.0000 "application " | TOPICAL_OINTMENT | Freq: Two times a day (BID) | CUTANEOUS | Status: DC
  Administered 2020-04-18 – 2020-04-20 (×4): 1 via NASAL
  Filled 2020-04-18 (×3): qty 22

## 2020-04-18 MED ORDER — FERROUS SULFATE 325 (65 FE) MG PO TABS: 325.0000 mg | ORAL_TABLET | Freq: Every day | ORAL | 1 refills | Status: AC

## 2020-04-18 MED ORDER — CHLORHEXIDINE GLUCONATE CLOTH 2 % EX PADS
6.0000 | MEDICATED_PAD | Freq: Every day | CUTANEOUS | Status: DC
  Administered 2020-04-18 – 2020-04-20 (×3): 6 via TOPICAL

## 2020-04-18 MED ORDER — SODIUM CHLORIDE 0.9 % IV SOLN
510.0000 mg | Freq: Once | INTRAVENOUS | Status: AC
  Administered 2020-04-18: 510 mg via INTRAVENOUS
  Filled 2020-04-18: qty 17

## 2020-04-18 NOTE — Progress Notes (Signed)
PROGRESS NOTE    [name]  ZJQ:[id number] DOB: December 28, 1955 DOA: 04/12/2020 PCP: [facility]   Chief Complain:Fall  Brief Narrative: [name] a64 y.o.male,with history of hypertension, diabetic foot ulcer, diabetes mellitus type 2, and more presents the ED with a chief complaint of fall. Patient reported 3 or 4 days ago he was walking in a parking lot and suffered a mechanical fall Since then he was having pain with weightbearing on the right leg, he presented to [facility], ER found to have intertrochanteric  fracture of right hip, and was  transferred to [facility] for surgery.  Orthopedics consulted and he underwent IM nailing of right hip by [name] on 4/1.  PT/OT recommended skilled nursing facility on discharge but patient is denying to be discharged to skilled nursing facility.  He was also noticed to be intermittently confused.  Difficulty in arranging home health due to his insurance issue.  Psychiatry also consulted for capacity determination.  TOC is closely following.   Assessment & Plan:   Active Problems:   Chronic pain   Uncontrolled type 2 diabetes mellitus with hyperglycemia, with long-term current use of insulin (HCC)   Essential hypertension, benign   Fx intertrochanteric hip (HCC)   Acute lower UTI   Anxiety   Agitation   Closed intertrochanteric fracture of right hip, initial encounter (HCC)   Right hip fracture -Report from mechanical fall -Status post IM nailing right hip by [name] on 4/1 -asa 325 mg for Postop DVT prophylaxis per ortho recommendatino , touchdown weightbearing as tolerated  -follow-up with Ortho in 1 week  Diabetic foot ulcer, right foot between second and third toe a large blister abscess on the plantar aspect of his foot that was decompressed after surgery.now wrapped Dressing changes per ortho "Foot daily cleansing with antibacterial soap and water. Apply dry dressing" F/u with ortho in  1week  Suspected UTI Urine culture (obtained after abx) + 50,000 enterobacter aerogenesresistance to rocephin.  Rocephin discontinued  Confusion -patient was very confused and agitated with active hallucination on presentation to [facility] per chart review, initially there was a concern for alcohol withdrawal , UDS was not ordered at the time,  -per daughter patient gets very confused when he has infection in the past -daughter reports patientat baseline has no confusion or memory issues, hedoes not drink alcohol, does not use illicit drug other than narcotics that is prescribed -Agitation resolved , confusion appears has much resolved, he is oriented x3 -he was  started on bactrim for 5 days empiric cover for possible infection ( diabetic foot ulcer vs UTI)  Hypertension he was initiallytachycardicand hypertensive,required higher dose of betablocker Blood pressure improved,continuehome dose betablocker,Norvasc and lisinopril Monitor blood pressure  Insulin-dependent type 2 diabetes,  A1c 7.2 Continue Levemir,ssi here  plan to resume glipizide at discharge F/u with pcp  Normocytic anemia Appear chronic,anemia work-upshowed low b12, b12 supplement prescribed Iron studies also showed iron deficiency.  We will give him a dose of IV iron.  Continue iron supplementation on discharge.  Chronic pain On oxycodone 15 mg q4hrs prn at home  Disposition:  Planned discharge cancelled due to he is refusing snf, not able to secure home health ( difficulty reaching family/insurance issues) He clearly needs 24/7 assistant at list for short treatment for safety and promote wound healing post right hip surgery, right foot wound.  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 Psychiatry consulted for capacity eval for SNF placement  Nutritional  Assessment: The patient's BMI is:Body mass index is 30.29 kg/m.Marland Kitchen         DVT prophylaxis:Heparin [place] Code  Status: full Family Communication: DNR Status is: Inpatient  Remains inpatient appropriate because:Unsafe d/c plan and Inpatient level of care appropriate due to severity of illness   Dispo: The patient is from: Home              Anticipated d/c is to: SNF vs [facility]              Patient currently is medically stable to d/c.   Difficult to place patient Yes    Consultants: Orthopedics  Procedures:IM nailing   Antimicrobials:  Anti-infectives (From admission, onward)   Start     Dose/Rate Route Frequency Ordered Stop   04/17/20 0000  sulfamethoxazole-trimethoprim (BACTRIM DS) 800-160 MG tablet        1 tablet Oral Every 12 hours 04/17/20 1435 04/22/20 2359   04/16/20 2200  sulfamethoxazole-trimethoprim (BACTRIM DS) 800-160 MG per tablet 1 tablet        1 tablet Oral Every 12 hours 04/16/20 1530 04/21/20 2159   04/14/20 1600  ceFAZolin (ANCEF) IVPB 2g/100 mL premix        2 g 200 mL/hr over 30 Minutes Intravenous Every 6 hours 04/14/20 1141 04/14/20 2142   04/14/20 0815  ceFAZolin (ANCEF) IVPB 2g/100 mL premix        2 g 200 mL/hr over 30 Minutes Intravenous On call to O.R. 04/13/20 1835 04/14/20 1033   04/13/20 0415  cefTRIAXone (ROCEPHIN) 1 g in sodium chloride 0.9 % 100 mL IVPB  Status:  Discontinued        1 g 200 mL/hr over 30 Minutes Intravenous Every 24 hours 04/13/20 0407 04/16/20 1528      Subjective: Patient seen and examined the bedside this morning.  Hemodynamically stable.  Denies any new complaints.  Continues to refuse skilled nursing facility placement and frustrated why we are not able to arrange  home health for him.  Objective: Vitals:   04/17/20 1826 04/17/20 2043 04/18/20 0355 04/18/20 0734  BP:  (!) 145/59 118/73   Pulse:  94 (!) 105 (!) 109  Resp:  18 19 18   Temp:  99.6 F (37.6 C) 97.8 F (36.6 C) 98.9 F (37.2 C)  TempSrc:  Oral Oral Oral  SpO2:  98% 99% 98%  Weight: 99.8 kg     Height: 6' 3"  (1.905 m)       Intake/Output Summary (Last 24  hours) at 04/18/2020 1101 Last data filed at 04/18/2020 0900 Gross per 24 hour  Intake --  Output 1300 ml  Net -1300 ml   Filed Weights   04/12/20 2118 04/17/20 1826  Weight: 107 kg 99.8 kg    Examination:  General exam: Overall comfortable, not in distress HEENT: PERRL Respiratory system:  no wheezes or crackles  Cardiovascular system: S1 & S2 heard, RRR.  Gastrointestinal system: Abdomen is nondistended, soft and nontender. Central nervous system: Alert and oriented Extremities: Dressing on the right foot, amputation of the left foot Skin: No rashes, no ulcers,no icterus     Data Reviewed: I have personally reviewed following labs and imaging studies  CBC: Recent Labs  Lab 04/12/20 2320 04/13/20 0549 04/15/20 0130 04/16/20 0314 04/17/20 0429  WBC 10.5 8.5 10.7* 9.6 9.5  NEUTROABS 8.8* 6.6  --   --   --   HGB 9.6* 8.9* 8.3* 8.3* 8.2*  HCT 27.9* 25.8* 24.2* 24.1* 24.3*  MCV 91.5  90.8 92.7 91.3 92.4  PLT 235 211 267 264 759   Basic Metabolic Panel: Recent Labs  Lab 04/13/20 0549 04/13/20 1053 04/15/20 0130 04/16/20 0314 04/17/20 0429  NA 134* 135 134* 137 135  K 3.7 3.7 4.4 4.2 3.8  CL 100 102 101 105 102  CO2 24 25 26 25 25   GLUCOSE 223* 208* 200* 145* 135*  BUN 21 22 23 22 22   CREATININE 0.97 0.97 1.14 1.05 1.09  CALCIUM 8.6* 8.7* 8.6* 8.7* 8.6*  MG 1.8 1.9  --  1.9 1.8  PHOS  --  3.2  --   --   --    GFR: Estimated Creatinine Clearance: 84 mL/min (by C-G formula based on SCr of 1.09 mg/dL). Liver Function Tests: Recent Labs  Lab 04/13/20 0549 04/13/20 1053  AST 21 19  ALT 17 16  ALKPHOS 62 61  BILITOT 1.4* 1.0  PROT 7.0 7.0  ALBUMIN 3.0* 2.9*   No results for input(s): LIPASE, AMYLASE in the last 168 hours. No results for input(s): AMMONIA in the last 168 hours. Coagulation Profile: Recent Labs  Lab 04/13/20 0215  INR 1.1   Cardiac Enzymes: No results for input(s): CKTOTAL, CKMB, CKMBINDEX, TROPONINI in the last 168 hours. BNP (last 3  results) No results for input(s): PROBNP in the last 8760 hours. HbA1C: No results for input(s): HGBA1C in the last 72 hours. CBG: Recent Labs  Lab 04/17/20 1104 04/17/20 1629 04/17/20 2029 04/18/20 0609 04/18/20 1057  GLUCAP 187* 200* 279* 161* 266*   Lipid Profile: No results for input(s): CHOL, HDL, LDLCALC, TRIG, CHOLHDL, LDLDIRECT in the last 72 hours. Thyroid Function Tests: Recent Labs    04/17/20 0429  TSH 1.163   Anemia Panel: Recent Labs    04/17/20 0429  VITAMINB12 175*  FOLATE 16.9  FERRITIN 165  TIBC 238*  IRON 29*   Sepsis Labs: No results for input(s): PROCALCITON, LATICACIDVEN in the last 168 hours.  Recent Results (from the past 240 hour(s))  SARS CORONAVIRUS 2 (TAT 6-24 HRS) Nasopharyngeal Nasopharyngeal Swab     Status: None   Collection Time: 04/13/20  1:50 AM   Specimen: Nasopharyngeal Swab  Result Value Ref Range Status   SARS Coronavirus 2 NEGATIVE NEGATIVE Final    Comment: (NOTE) SARS-CoV-2 target nucleic acids are NOT DETECTED.  The SARS-CoV-2 RNA is generally detectable in upper and lower respiratory specimens during the acute phase of infection. Negative results do not preclude SARS-CoV-2 infection, do not rule out co-infections with other pathogens, and should not be used as the sole basis for treatment or other patient management decisions. Negative results must be combined with clinical observations, patient history, and epidemiological information. The expected result is Negative.  Fact Sheet for Patients: SugarRoll.be  Fact Sheet for Healthcare Providers: https://www.woods-mathews.com/  This test is not yet approved or cleared by the [place] FDA and  has been authorized for detection and/or diagnosis of SARS-CoV-2 by FDA under an Emergency Use Authorization (EUA). This EUA will remain  in effect (meaning this test can be used) for the duration of the COVID-19 declaration under  Se ction 564(b)(1) of the Act, 21 U.S.C. section 360bbb-3(b)(1), unless the authorization is terminated or revoked sooner.  Performed at [facility], [place] [address], [place], [place] [address]   Surgical pcr screen     Status: Abnormal   Collection Time: 04/14/20  8:29 AM   Specimen: Nasal Mucosa; Nasal Swab  Result Value Ref Range Status   MRSA,  PCR NEGATIVE NEGATIVE Final   Staphylococcus aureus POSITIVE (A) NEGATIVE Final    Comment: (NOTE) The Xpert SA Assay (FDA approved for NASAL specimens in patients 76 years of age and older), is one component of a comprehensive surveillance program. It is not intended to diagnose infection nor to guide or monitor treatment. Performed at [facility], [place] [address] [place], [address] [address]   Culture, Urine     Status: Abnormal   Collection Time: 04/14/20 12:30 PM   Specimen: Urine, Random  Result Value Ref Range Status   Specimen Description URINE, RANDOM  Final   Special Requests   Final    NONE Performed at [facility], [place] [address], [place], [place] [address]    Culture 50,000 COLONIES/mL ENTEROBACTER AEROGENES (A)  Final   Report Status 04/16/2020 FINAL  Final   Organism ID, Bacteria ENTEROBACTER AEROGENES (A)  Final      Susceptibility   Enterobacter aerogenes - MIC*    CEFAZOLIN >=64 RESISTANT Resistant     CEFEPIME 2 SENSITIVE Sensitive     CEFTRIAXONE >=64 RESISTANT Resistant     CIPROFLOXACIN 2 INTERMEDIATE Intermediate     GENTAMICIN <=1 SENSITIVE Sensitive     IMIPENEM 2 SENSITIVE Sensitive     NITROFURANTOIN <=16 SENSITIVE Sensitive     TRIMETH/SULFA <=20 SENSITIVE Sensitive     PIP/TAZO 8 SENSITIVE Sensitive     * 50,000 COLONIES/mL ENTEROBACTER AEROGENES         Radiology Studies: No results found.      Scheduled Meds: . amitriptyline  25 mg Oral QHS  . amLODipine  5 mg Oral Daily  . aspirin EC  81 mg Oral QODAY  . folic acid  1 mg Oral Daily  . heparin  5,000 Units  Subcutaneous Q8H  . insulin aspart  0-20 Units Subcutaneous TID WC  . insulin aspart  0-5 Units Subcutaneous QHS  . insulin detemir  50 Units Subcutaneous QHS  . lisinopril  40 mg Oral Daily  . metoprolol succinate  100 mg Oral Daily  . multivitamin with minerals  1 tablet Oral Daily  . pentoxifylline  400 mg Oral TID WC  . senna-docusate  1 tablet Oral BID  . sulfamethoxazole-trimethoprim  1 tablet Oral Q12H  . thiamine  100 mg Oral Daily   Or  . thiamine  100 mg Intravenous Daily  . vitamin B-12  1,000 mcg Oral Daily   Continuous Infusions:   LOS: 5 days    Time spent: 25 mins.More than 50% of that time was spent in counseling and/or coordination of care.      Shelly Coss, MD [facility] P4/05/2020, 11:01 AM

## 2020-04-18 NOTE — Progress Notes (Signed)
Attempted to discharge pt. Pt's daughter in law drove 4 hours to pick pt up. Pt is not able to bear any weight on his lower extremities. This RN and NT attempted to hoyer pt to vehicle but pt was in too much pain and requested to go back up stairs. Daughter in law stated they will have a hard time caring for pt in his current condition.

## 2020-04-18 NOTE — Progress Notes (Signed)
Patient took off her cardiac monitor and refused to have it back on, stated he is going home and not wearing it.

## 2020-04-18 NOTE — Progress Notes (Signed)
Patient is wet and refused to have a bath.

## 2020-04-18 NOTE — Discharge Summary (Addendum)
Physician Discharge Summary  [place] QMG:[id number] DOB: 1955-04-21 DOA: 04/12/2020  PCP: [facility], [place] date: 04/12/2020 Discharge date: 04/20/2020  Admitted From: Home Disposition:  SNF  Discharge Condition:Stable CODE STATUS: DNR Diet recommendation: Heart Healthy  Brief/Interim Summary:  [name] a64 y.o.male,with history of hypertension, diabetic foot ulcer, diabetes mellitus type 2, and more presents the ED with a chief complaint of fall. Patient reported 3 or 4 days ago he was walking in a parking lot and suffered a mechanical fall Since then he was having pain with weightbearing on the right leg, he presented to [facility], ER found to have intertrochanteric fracture of right hip, and was  transferred to [facility] for surgery.  Orthopedics consulted and he underwent IM nailing of right hip by [name] on 4/1.  PT/OT recommended skilled nursing facility on discharge but patient declined to be discharged to skilled nursing facility.   Difficulty in arranging home health due to his insurance issue.  TOC was closely following.Psychiatry also consulted for capacity determination and he was found to have capacity.  Patient was not able to stand on feet so plan for discharge to home was canceled and patient is now agreeable for discharge to skilled nursing facility.   Following problems were addressed during his hospitalization:  Right hip fracture -Reported from mechanical fall -Status post IM nailing right hip by [name] on 4/1 -asa 325 mg forPostop DVT prophylaxisper ortho recommendation -follow-up with Ortho,Dr Sharol Given, in 1 week  Diabetic foot ulcer, right foot between second and third toe a large blister abscess on the plantar aspect of his foot that was decompressed after surgery.now wrapped Dressing changes per ortho "Foot daily cleansing with antibacterial soap and water. Apply dry dressing" F/u with ortho in  1week  Suspected UTI Urine culture (obtained after abx) + 50,000 enterobacter aerogenesresistance to rocephin.  Rocephin discontinued  Confusion -patient was very confused and agitated with active hallucinationon presentation to [facility] per chart review, initially there was a concern for alcohol withdrawal , UDS was not ordered at the time, -per daughter patient gets very confused when he has infection in the past -daughter reports patientat baseline has no confusion or memory issues, hedoes not drink alcohol, does not use illicit drug other than narcotics that is prescribed -Agitation resolved,confusion appears hasmuchresolved, he is orientedx3 -he wasstartedon bactrim for 5 days empiric cover for possible infection ( diabetic foot ulcer vs UTI)  Hypertension he was initiallytachycardicand hypertensive,required higher dose of betablocker Blood pressure improved,continuehome dose betablocker,Norvasc and lisinopril  Insulin-dependent type 2 diabetes,  A1c 7.2 Continue Levemir,ssi here plan to resume glipizideat discharge F/u with pcp  Normocytic anemia Appear chronic,anemia work-upshowed low b12, b12 supplement prescribed Iron studies also showed iron deficiency.  Given him a dose of IV iron.  Continue iron supplementation on discharge.  Chronic pain On oxycodone 15 mg q4hrs prn at home  Disposition:  PT/OT recommended SNF on  discharge  Nutritional Assessment: The patient's BMI is:Body mass index is 30.29 kg/m.Marland Kitchen   Discharge Diagnoses:  Active Problems:   Chronic pain   Uncontrolled type 2 diabetes mellitus with hyperglycemia, with long-term current use of insulin (HCC)   Essential hypertension, benign   Fx intertrochanteric hip (HCC)   Acute lower UTI   Anxiety   Agitation   Closed intertrochanteric fracture of right hip, initial encounter [facility])    Discharge Instructions  Discharge Instructions    Apply dressing   Complete  by: As directed  Apply clean dry dressing to right foot daily.  May cleanse with Dial soap and water   Diet - low sodium heart healthy   Complete by: As directed    Carb modified diet   Discharge wound care:   Complete by: As directed    1)Dressing instructions per ortho Dr Sharol Given 2)Follow up with Dr Sharol Given in a week 3)Do a CBC and BMP tests in a week 4)Follow up with your PCP in 2 weeks   Increase activity slowly   Complete by: As directed    Touch down weight bearing   Complete by: As directed      Allergies as of 04/20/2020      Reactions   Other Hives   Anti-snake venim for copperhead venom.   Pravastatin Other (See Comments)   Leg cramps, elevated CK   Prednisone Other (See Comments)   Patient states "it runs my blood sugar up so I don't take it."   Toradol [ketorolac Tromethamine] Hives, Nausea And Vomiting   Pill form only      Medication List    STOP taking these medications   meloxicam 15 MG tablet Commonly known as: MOBIC     TAKE these medications   acetaminophen 325 MG tablet Commonly known as: Tylenol Take 2 tablets (650 mg total) by mouth every 4 (four) hours as needed.   allopurinol 300 MG tablet Commonly known as: ZYLOPRIM Take 300 mg by mouth daily.   amitriptyline 25 MG tablet Commonly known as: ELAVIL Take 25 mg by mouth at bedtime.   amLODipine 10 MG tablet Commonly known as: NORVASC Take 10 mg by mouth daily.   aspirin EC 325 MG tablet Take 1 tablet (325 mg total) by mouth daily. What changed:   medication strength  how much to take   cyanocobalamin 1000 MCG tablet Take 1 tablet (1,000 mcg total) by mouth daily.   cyclobenzaprine 10 MG tablet Commonly known as: FLEXERIL SMARTSIG:1 Tablet(s) By Mouth Every 12 Hours   ferrous sulfate 325 (65 FE) MG tablet Take 1 tablet (325 mg total) by mouth daily.   folic acid 1 MG tablet Commonly known as: FOLVITE Take 1 tablet (1 mg total) by mouth daily.   furosemide 20 MG  tablet Commonly known as: LASIX Take 20 mg by mouth daily.   gabapentin 300 MG capsule Commonly known as: NEURONTIN Take 900 mg by mouth daily.   glipiZIDE 2.5 MG 24 hr tablet Commonly known as: GLUCOTROL XL Take 2.5 mg by mouth every morning.   Levemir FlexTouch 100 UNIT/ML FlexPen Generic drug: insulin detemir Inject 70 Units into the skin 2 (two) times daily.   lisinopril 40 MG tablet Commonly known as: ZESTRIL Take 40 mg by mouth daily.   metoprolol succinate 100 MG 24 hr tablet Commonly known as: TOPROL-XL Take 100 mg by mouth daily.   Mitigare 0.6 MG Caps Generic drug: Colchicine Take 2 capsules by mouth daily.   multivitamin with minerals Tabs tablet Take 1 tablet by mouth daily.   nitroGLYCERIN 0.2 mg/hr patch Commonly known as: NITRODUR - Dosed in mg/24 hr PLACE 1 PATCH (0.2 MG TOTAL) ONTO THE SKIN DAILY. APPLY NEW PATCH REMOVE OLD PATCH DAILY ON FOOT . PLACE IN DIFFERENT PLACE ON TOP OF FOOT   Oxycodone HCl 10 MG Tabs Take 1 tablet (10 mg total) by mouth every 6 (six) hours as needed for severe pain or moderate pain. What changed:   when to take this  reasons to take this  Another medication with the same name was removed. Continue taking this medication, and follow the directions you see here.   pentoxifylline 400 MG CR tablet Commonly known as: TRENTAL Take 1 tablet (400 mg total) by mouth 3 (three) times daily with meals.   polyethylene glycol 17 g packet Commonly known as: MIRALAX / GLYCOLAX Take 17 g by mouth daily as needed for mild constipation.   senna-docusate 8.6-50 MG tablet Commonly known as: Senokot-S Take 1 tablet by mouth at bedtime for 10 days.   thiamine 100 MG tablet Take 1 tablet (100 mg total) by mouth daily.   tizanidine 2 MG capsule Commonly known as: ZANAFLEX Take 2 mg by mouth 3 (three) times daily.            Durable Medical Equipment  (From admission, onward)         Start     Ordered   04/18/20 1644  For  home use only DME lightweight manual wheelchair with seat cushion  Once       Comments: Patient suffers from  [place] (IM) NAIL INTERTROCHANTRIC  which impairs their ability to perform daily activities like walking in the home.  A rolling walker will not resolve  issue with performing activities of daily living. A wheelchair will allow patient to safely perform daily activities. Patient is not able to propel themselves in the home using a standard weight wheelchair due to weakness. Patient can self propel in the lightweight wheelchair. Length of need 12 months Accessories: elevating leg rests (ELRs), wheel locks, extensions and anti-tippers.   04/18/20 1648           Discharge Care Instructions  (From admission, onward)         Start     Ordered   04/20/20 0000  Discharge wound care:       Comments: 1)Dressing instructions per ortho Dr Sharol Given 2)Follow up with Dr Sharol Given in a week 3)Do a CBC and BMP tests in a week 4)Follow up with your PCP in 2 weeks   04/20/20 1020   04/14/20 0000  Touch down weight bearing        04/14/20 1117          Contact information for follow-up providers    [facility], [place], [place]. Go on 04/27/2020.   Specialty: Orthopedic Surgery Why: [facility] hospital appointment scheduled for 04/27/2020 with [name] at 1 pm Contact information: [place] [address] [phone]        [facility], [place] Follow up in 1 week(s).   Why: hospital discharge follow up, pcp to monitor anemia Contact information: [place] [address] [phone]            Contact information for after-discharge care    [facility] .   Service: Skilled Nursing Contact information: [place] [phone]                 Allergies  Allergen Reactions  . Other Hives    Anti-snake venim for copperhead venom.  . Pravastatin  Other (See Comments)    Leg cramps, elevated CK  . Prednisone Other (See Comments)    Patient states "it runs my blood sugar up so I don't take it."  . Toradol [Ketorolac Tromethamine] Hives and Nausea And Vomiting    Pill form only    Consultations:  Orthopedics   Procedures/Studies: DG Tibia/Fibula  Right  Result Date: 04/13/2020 CLINICAL DATA:  Fall, pain EXAM: RIGHT TIBIA AND FIBULA - 2 VIEW COMPARISON:  None. FINDINGS: Two view radiograph right tibia and fibula demonstrates normal alignment. No fracture or dislocation. 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 Vascular calcifications are seen within the posterior soft tissues. Dystrophic calcifications are seen within the anterior soft tissues. IMPRESSION: No acute fracture or dislocation. Electronically Signed   By: Fidela Salisbury MD   On: 04/13/2020 00:38   DG Chest Port 1 View  Result Date: 04/13/2020 CLINICAL DATA:  Medical clearance for surgery.  Right hip fracture EXAM: PORTABLE CHEST 1 VIEW COMPARISON:  10/06/2019 FINDINGS: The heart size and mediastinal contours are within normal limits. Both lungs are clear. The visualized skeletal structures are unremarkable. IMPRESSION: No active disease. Electronically Signed   By: Fidela Salisbury MD   On: 04/13/2020 01:04   DG C-Arm 1-60 Min  Result Date: 04/14/2020 CLINICAL DATA:  Trauma. Additional history provided: Surgery, right hip IM nail (short length). Provided fluoroscopy time 1 minutes (23.12 mGy). EXAM: OPERATIVE right HIP (WITH PELVIS IF PERFORMED) 2 VIEWS TECHNIQUE: Fluoroscopic spot image(s) were submitted for interpretation post-operatively. COMPARISON:  Radiographs of the right hip 04/12/2020. FINDINGS: Two intraoperative fluoroscopic images of the right hip are submitted. The images demonstrate an intramedullary nail within the proximal right femur. An interlocking component extends into the femoral  head and neck. Hardware traverses a known intertrochanteric femur fracture. Persistent although improved displacement at the fracture site. There is an additional interlocking screw within the femoral shaft. Redemonstrated known associated avulsion fracture of the lesser trochanter. IMPRESSION: Two intraoperative fluoroscopic images of the right hip, as described. Electronically Signed   By: Kellie Simmering DO   On: 04/14/2020 11:36   DG HIP OPERATIVE UNILAT WITH PELVIS RIGHT  Result Date: 04/14/2020 CLINICAL DATA:  Trauma. Additional history provided: Surgery, right hip IM nail (short length). Provided fluoroscopy time 1 minutes (23.12 mGy). EXAM: OPERATIVE right HIP (WITH PELVIS IF PERFORMED) 2 VIEWS TECHNIQUE: Fluoroscopic spot image(s) were submitted for interpretation post-operatively. COMPARISON:  Radiographs of the right hip 04/12/2020. FINDINGS: Two intraoperative fluoroscopic images of the right hip are submitted. The images demonstrate an intramedullary nail within the proximal right femur. An interlocking component extends into the femoral head and neck. Hardware traverses a known intertrochanteric femur fracture. Persistent although improved displacement at the fracture site. There is an additional interlocking screw within the femoral shaft. Redemonstrated known associated avulsion fracture of the lesser trochanter. IMPRESSION: Two intraoperative fluoroscopic images of the right hip, as described. Electronically Signed   By: Kellie Simmering DO   On: 04/14/2020 11:36   DG Hip Unilat W or Wo Pelvis 2-3 Views Right  Result Date: 04/13/2020 CLINICAL DATA:  Multiple falls, right hip pain EXAM: DG HIP (WITH OR WITHOUT PELVIS) 2-3V RIGHT COMPARISON:  None. FINDINGS: Two view radiograph of the right hip and single view radiograph the pelvis demonstrates a acute, intratrochanteric fracture of the right hip with varus angulation and mild distraction of the distal fracture fragment. There is avulsion of the  lesser trochanter. The femoral head is still seated within the right acetabulum and the joint space is preserved. Mild left hip degenerative arthritis. IMPRESSION: Acute, angulated intratrochanteric right hip fracture with avulsion of the lesser trochanter. Electronically Signed   By: Fidela Salisbury MD   On: 04/13/2020 00:33   DG Femur Min 2 Views Right  Result Date: 04/13/2020 CLINICAL DATA:  Fall, right hip pain EXAM: RIGHT FEMUR 2 VIEWS COMPARISON:  None. FINDINGS: Two view radiograph right femur again demonstrates a a angulated, mildly distracted right intratrochanteric hip fracture with avulsion of the lesser trochanter. The distal femoral shaft appears intact. The femoral head is still seated within the right acetabulum and the joint space is preserved. Vascular calcifications are seen within the posterior soft tissues. IMPRESSION: Angulated, distracted right hip intratrochanteric fracture with avulsion of the lesser trochanter. Electronically Signed   By: Fidela Salisbury MD   On: 04/13/2020 00:40      Subjective: Patient seen and examined at the bedside this afternoon.  Hemodynamically stable for discharge.  Patient does not want to stay in the hospital and denies going to rehab and wants to go home.  Discharge Exam: Vitals:   04/19/20 1954 04/20/20 0758  BP: 106/71 100/63  Pulse: 97 100  Resp: 17 18  Temp: 98.3 F (36.8 C) 98.2 F (36.8 C)  SpO2: 98% 97%   Vitals:   04/19/20 1501 04/19/20 1708 04/19/20 1954 04/20/20 0758  BP: 106/68 97/64 106/71 100/63  Pulse: 92 88 97 100  Resp: 18 19 17 18   Temp: 98 F (36.7 C) 98.3 F (36.8 C) 98.3 F (36.8 C) 98.2 F (36.8 C)  TempSrc:  Oral  Oral  SpO2: 99% 96% 98% 97%  Weight:      Height:        General: Pt is alert, awake, not in acute distress Cardiovascular: RRR, S1/S2 +, no rubs, no gallops Respiratory: CTA bilaterally, no wheezing, no rhonchi Abdominal: Soft, NT, ND, bowel sounds + Extremities: no edema, no cyanosis,  amputation of the left foot, dressing on the right foot    The results of significant diagnostics from this hospitalization (including imaging, microbiology, ancillary and laboratory) are listed below for reference.     Microbiology: Recent Results (from the past 240 hour(s))  SARS CORONAVIRUS 2 (TAT 6-24 HRS) Nasopharyngeal Nasopharyngeal Swab     Status: None   Collection Time: 04/13/20  1:50 AM   Specimen: Nasopharyngeal Swab  Result Value Ref Range Status   SARS Coronavirus 2 NEGATIVE NEGATIVE Final    Comment: (NOTE) SARS-CoV-2 target nucleic acids are NOT DETECTED.  The SARS-CoV-2 RNA is generally detectable in upper and lower respiratory specimens during the acute phase of infection. Negative results do not preclude SARS-CoV-2 infection, do not rule out co-infections with other pathogens, and should not be used as the sole basis for treatment or other patient management decisions. Negative results must be combined with clinical observations, patient history, and epidemiological information. The expected result is Negative.  Fact Sheet for Patients: SugarRoll.be  Fact Sheet for Healthcare Providers: https://www.woods-mathews.com/  This test is not yet approved or cleared by the [place] FDA and  has been authorized for detection and/or diagnosis of SARS-CoV-2 by FDA under an Emergency Use Authorization (EUA). This EUA will remain  in effect (meaning this test can be used) for the duration of the COVID-19 declaration under Se ction 564(b)(1) of the Act, 21 U.S.C. section 360bbb-3(b)(1), unless the authorization is terminated or revoked sooner.  Performed at [facility], [place] [address] [place], [place] [address]   Surgical pcr screen     Status: Abnormal   Collection Time: 04/14/20  8:29 AM   Specimen: Nasal Mucosa; Nasal Swab  Result Value Ref Range Status   MRSA, PCR NEGATIVE NEGATIVE Final   Staphylococcus  aureus POSITIVE (A) NEGATIVE Final    Comment: (  NOTE) The Xpert SA Assay (FDA approved for NASAL specimens in patients 48 years of age and older), is one component of a comprehensive surveillance program. It is not intended to diagnose infection nor to guide or monitor treatment. Performed at [facility], [place] [address], [place], [place] [address]   Culture, Urine     Status: Abnormal   Collection Time: 04/14/20 12:30 PM   Specimen: Urine, Random  Result Value Ref Range Status   Specimen Description URINE, RANDOM  Final   Special Requests   Final    NONE Performed at [facility], [place] [address], [place], [place] [address]    Culture 50,000 COLONIES/mL ENTEROBACTER AEROGENES (A)  Final   Report Status 04/16/2020 FINAL  Final   Organism ID, Bacteria ENTEROBACTER AEROGENES (A)  Final      Susceptibility   Enterobacter aerogenes - MIC*    CEFAZOLIN >=64 RESISTANT Resistant     CEFEPIME 2 SENSITIVE Sensitive     CEFTRIAXONE >=64 RESISTANT Resistant     CIPROFLOXACIN 2 INTERMEDIATE Intermediate     GENTAMICIN <=1 SENSITIVE Sensitive     IMIPENEM 2 SENSITIVE Sensitive     NITROFURANTOIN <=16 SENSITIVE Sensitive     TRIMETH/SULFA <=20 SENSITIVE Sensitive     PIP/TAZO 8 SENSITIVE Sensitive     * 50,000 COLONIES/mL ENTEROBACTER AEROGENES     Labs: BNP (last 3 results) No results for input(s): BNP in the last 8760 hours. Basic Metabolic Panel: Recent Labs  Lab 04/13/20 1053 04/15/20 0130 04/16/20 0314 04/17/20 0429  NA 135 134* 137 135  K 3.7 4.4 4.2 3.8  CL 102 101 105 102  CO2 25 26 25 25   GLUCOSE 208* 200* 145* 135*  BUN 22 23 22 22   CREATININE 0.97 1.14 1.05 1.09  CALCIUM 8.7* 8.6* 8.7* 8.6*  MG 1.9  --  1.9 1.8  PHOS 3.2  --   --   --    Liver Function Tests: Recent Labs  Lab 04/13/20 1053  AST 19  ALT 16  ALKPHOS 61  BILITOT 1.0  PROT 7.0  ALBUMIN 2.9*   No results for input(s): LIPASE, AMYLASE in the last 168 hours. No results for  input(s): AMMONIA in the last 168 hours. CBC: Recent Labs  Lab 04/15/20 0130 04/16/20 0314 04/17/20 0429  WBC 10.7* 9.6 9.5  HGB 8.3* 8.3* 8.2*  HCT 24.2* 24.1* 24.3*  MCV 92.7 91.3 92.4  PLT 267 264 311   Cardiac Enzymes: No results for input(s): CKTOTAL, CKMB, CKMBINDEX, TROPONINI in the last 168 hours. BNP: Invalid input(s): POCBNP CBG: Recent Labs  Lab 04/19/20 0645 04/19/20 1139 04/19/20 1715 04/19/20 1952 04/20/20 0654  GLUCAP 228* 242* 224* 237* 170*   D-Dimer No results for input(s): DDIMER in the last 72 hours. Hgb A1c No results for input(s): HGBA1C in the last 72 hours. Lipid Profile No results for input(s): CHOL, HDL, LDLCALC, TRIG, CHOLHDL, LDLDIRECT in the last 72 hours. Thyroid function studies No results for input(s): TSH, T4TOTAL, T3FREE, THYROIDAB in the last 72 hours.  Invalid input(s): FREET3 Anemia work up No results for input(s): VITAMINB12, FOLATE, FERRITIN, TIBC, IRON, RETICCTPCT in the last 72 hours. Urinalysis    Component Value Date/Time   COLORURINE YELLOW 04/13/2020 0035   APPEARANCEUR CLOUDY (A) 04/13/2020 0035   LABSPEC 1.016 04/13/2020 0035   PHURINE 5.0 04/13/2020 0035   GLUCOSEU 150 (A) 04/13/2020 0035   HGBUR MODERATE (A) 04/13/2020 0035   BILIRUBINUR NEGATIVE 04/13/2020 0035  KETONESUR 80 (A) 04/13/2020 0035   PROTEINUR NEGATIVE 04/13/2020 0035   NITRITE NEGATIVE 04/13/2020 0035   LEUKOCYTESUR LARGE (A) 04/13/2020 0035   Sepsis Labs Invalid input(s): PROCALCITONIN,  WBC,  LACTICIDVEN Microbiology Recent Results (from the past 240 hour(s))  SARS CORONAVIRUS 2 (TAT 6-24 HRS) Nasopharyngeal Nasopharyngeal Swab     Status: None   Collection Time: 04/13/20  1:50 AM   Specimen: Nasopharyngeal Swab  Result Value Ref Range Status   SARS Coronavirus 2 NEGATIVE NEGATIVE Final    Comment: (NOTE) SARS-CoV-2 target nucleic acids are NOT DETECTED.  The SARS-CoV-2 RNA is generally detectable in upper and lower respiratory  specimens during the acute phase of infection. Negative results do not preclude SARS-CoV-2 infection, do not rule out co-infections with other pathogens, and should not be used as the sole basis for treatment or other patient management decisions. Negative results must be combined with clinical observations, patient history, and epidemiological information. The expected result is Negative.  Fact Sheet for Patients: SugarRoll.be  Fact Sheet for Healthcare Providers: https://www.woods-mathews.com/  This test is not yet approved or cleared by the [place] FDA and  has been authorized for detection and/or diagnosis of SARS-CoV-2 by FDA under an Emergency Use Authorization (EUA). This EUA will remain  in effect (meaning this test can be used) for the duration of the COVID-19 declaration under Se ction 564(b)(1) of the Act, 21 U.S.C. section 360bbb-3(b)(1), unless the authorization is terminated or revoked sooner.  Performed at [facility], [place] [address], [place], [place] [address]   Surgical pcr screen     Status: Abnormal   Collection Time: 04/14/20  8:29 AM   Specimen: Nasal Mucosa; Nasal Swab  Result Value Ref Range Status   MRSA, PCR NEGATIVE NEGATIVE Final   Staphylococcus aureus POSITIVE (A) NEGATIVE Final    Comment: (NOTE) The Xpert SA Assay (FDA approved for NASAL specimens in patients 97 years of age and older), is one component of a comprehensive surveillance program. It is not intended to diagnose infection nor to guide or monitor treatment. Performed at [facility], [place] [address], [place], [place] [address]   Culture, Urine     Status: Abnormal   Collection Time: 04/14/20 12:30 PM   Specimen: Urine, Random  Result Value Ref Range Status   Specimen Description URINE, RANDOM  Final   Special Requests   Final    NONE Performed at [facility], [place] [address] [place], [place] [address]    Culture  50,000 COLONIES/mL ENTEROBACTER AEROGENES (A)  Final   Report Status 04/16/2020 FINAL  Final   Organism ID, Bacteria ENTEROBACTER AEROGENES (A)  Final      Susceptibility   Enterobacter aerogenes - MIC*    CEFAZOLIN >=64 RESISTANT Resistant     CEFEPIME 2 SENSITIVE Sensitive     CEFTRIAXONE >=64 RESISTANT Resistant     CIPROFLOXACIN 2 INTERMEDIATE Intermediate     GENTAMICIN <=1 SENSITIVE Sensitive     IMIPENEM 2 SENSITIVE Sensitive     NITROFURANTOIN <=16 SENSITIVE Sensitive     TRIMETH/SULFA <=20 SENSITIVE Sensitive     PIP/TAZO 8 SENSITIVE Sensitive     * 50,000 COLONIES/mL ENTEROBACTER AEROGENES    Please note: You were cared for by a hospitalist during your hospital stay. Once you are discharged, your primary care physician will handle any further medical issues. Please note that NO REFILLS for any discharge medications will be authorized once you are discharged, as it is  imperative that you return to your primary care physician (or establish a relationship with a primary care physician if you do not have one) for your post hospital discharge needs so that they can reassess your need for medications and monitor your lab values.    Time coordinating discharge: 40 minutes  SIGNED:   Shelly Coss, MD  [facility] 04/20/2020, 10:21 AM Pager [phone]  If 7PM-7AM, please contact night-coverage [email or web address] Password [email or web address]

## 2020-04-18 NOTE — Plan of Care (Signed)
  Problem: Elimination: Goal: Will not experience complications related to bowel motility Outcome: Progressing Goal: Will not experience complications related to urinary retention Outcome: Progressing   Problem: Pain Managment: Goal: General experience of comfort will improve Outcome: Progressing   Problem: Safety: Goal: Ability to remain free from injury will improve Outcome: Progressing   

## 2020-04-18 NOTE — Progress Notes (Signed)
Inpatient Diabetes Program Recommendations  AACE/ADA: New Consensus Statement on Inpatient Glycemic Control (2015)  Target Ranges:  Prepandial:   less than 140 mg/dL      Peak postprandial:   less than 180 mg/dL (1-2 hours)      Critically ill patients:  140 - 180 mg/dL   Lab Results  Component Value Date   GLUCAP 266 (H) 04/18/2020   HGBA1C 7.2 (H) 04/13/2020    Review of Glycemic Control Results for [name], [name] (MRN [id number]) as of 04/18/2020 13:11  Ref. Range 04/17/2020 06:47 04/17/2020 11:04 04/17/2020 16:29 04/17/2020 20:29 04/18/2020 06:09 04/18/2020 10:57  Glucose-Capillary Latest Ref Range: 70 - 99 mg/dL 129 (H) 187 (H) 200 (H) 279 (H) 161 (H) 266 (H)   Diabetes history: DM2 Outpatient Diabetes medications: Levemir 70 units BID, Glipizide 2.5 mg daily Current orders for Inpatient glycemic control: Levemir 50 units QHS, Novolog 0-20 unts TID and 0-5 units QHS  Inpatient Diabetes Program Recommendations:     Novolog 2-3 units TID with meals if eats at least 50%.  Will continue to follow while inpatient.  Thank you, Reche Dixon, RN, BSN Diabetes Coordinator Inpatient Diabetes Program [phone] (team pager from 8a-5p)

## 2020-04-18 NOTE — Consult Note (Signed)
Mental Capacity Assessment: I have evaluated the following areas to assess the [name]'s mental capacity regarding medical decision-making ability which pertains to competency to accept or refuse medical treatment.   The specific treatment or service in question is: refusing SNF placement.  Communication: The patient was able to clearly state preferred treatment options for his medical care at this time. The patient was able to decide that he does not want to go to a skilled nursing facility.  Factors that could compromise this communication process include infection, pain, and financial barriers.  Regarding his SNF placement he states "I fell and broke my femur bone.  And I need help walking.  "  As it pertains to his most serious medical problem, that is the hip fracture.    Understanding: The patient was able to recall information, link causal relationships, and process general probabilities regarding life situations and medical treatment scenarios "treatment is surgery and therapy.  There are no more operations, everything was fixed.  I opted for the operation to fix the fracture.".  He was able to paraphrase his view of the current situation and his thoughts about it including (or "but not including) future-oriented scenarios "I could tend potentially fall again.  I fell in [place] and fracture my other femur, it took 6 months to heal.  But now I fall and fracture my right hip, and is healed in 4 days and now I can go to the facility.  Help me to understand how that is possible?  If I go home and fall and get hurt, it will not heal good."  The patient did not present with impairments in memory, attention span, or intelligence.   Appreciation: The patient was able to identify and describe he is various illnesses and treatment options with potential outcomes. The patient did not present with concerns such as denial or delusional thought-process. "  I am confused because I opted to have surgery.   Now I am not crazy, I do not have to go to that place.  My insurance company does not cover it, and I did not need another bill.  Now that I had surgery not have to have therapy.  I have 4 girls and 4 boys, 9 grandchildren who can all help me with therapy. " There is nothing wrong with me.  I can do my own therapy.  Just give me my papers so I can sign it, and not hold yall liable if anything happens to me.  You want me to go to a facility where might get more help?"    Rationalization: The patient was able to weigh risks and benefits and come to a conclusion congruent with patient's perceived goals. Concerns regarding this category are: depression, acute/chronic encephalopathy, electrolyte imbalances,  In conclusion, the patient is-not experiencing an acute medical scenario: Recent refusal of SNF or post surgical operation which could compromise in mental capacity.    Conclusion: At this time, there is insufficient evidence to warrant removal of the patient's rights for medical decision-making.  He can clearly determine mental capacity for decision-making due to presence of underlying acute transient etiology.  At this time, we can determine that the patient does have functional mental capacity for medical decision-making including the right to accept or refuse SNF treatment.  Patient has an appreciation of the fact that he may be harmed if she refuses rehabilitation.  Patient is able to reason with writer and explain why she has made her decision the way she  has made it.  For these reasons writer feels that patient has capacity at this point in time to participate in treatment.

## 2020-04-18 NOTE — Progress Notes (Signed)
PT Cancellation Note  Patient Details Name: [name] MRN: [id number] DOB: [birth year]   Cancelled Treatment:    Reason Eval/Treat Not Completed: Patient declined, no reason specified.  Pt refusing EOB mobility, leg exercises or OOB mobility today.  He reports he walked to the bathroom to have a BM without a walker (RN denies) which I think is a falsification as he is two person max assist to even attempt to get partially EOB with me yesterday.  He also reports he is going home tomorrow. He says, "My son who lives 4 hours away is coming to get me".  He relays that his son is going to take him to his house in [place] and care for him there.  I asked if his son has seen him move and he reported, "He is not going to drive 4 hours for a one hour visit!".  I told [name] that I would check on him tomorrow.   Thanks,  Verdene Lennert, PT, DPT  Acute Rehabilitation [phone] pager #[phone] office       Wells Guiles B Curley Hogen 04/18/2020, 3:40 PM

## 2020-04-18 NOTE — TOC Transition Note (Signed)
Transition of Care [facility]) - CM/SW Discharge Note   Patient Details  Name: [name] MRN: [id number] Date of Birth: February 20, 1955  Transition [facility] Care [facility]) CM/SW Contact:  Sharin Mons, RN Phone Number: 04/18/2020, 4:26 PM   Clinical Narrative:    NCM received called from pt's daughter in law Verline Lema [phone]). Kaitlyn informed NCM pt has agreed to d/c to son's residence in [place] to recover. Verline Lema stated she will arrive at 8pm  to pick pt up.  NCM spoke with pt @ beside. Pt alert and oriented x4. NCM confirmed d/c plan, pt to d/c to son's home in [place] . Pt agreed, NCM made MD and nurse aware.   NCM UNABLE TO [place] 2/2 PAYOR SOURCE/ LIMITED STAFFING. Truman Hayward states she has CNA training and will help husband care for pt.  Beside nurse to teach son/daughter in law how to care for foot ( drsg change) prior to d/c.  Pt already given W/C last year through Medicaid,unable to get another W/C, pt made aware.  Post hospital follow up appointment noted on AVS.  Pt without Rx med concerns.    RNCM will sign off for now as intervention is no longer needed. Please consult [place] again if new needs arise.      Barriers to Discharge: Other (comment)   Patient Goals and CMS Choice        Discharge Placement                       Discharge Plan and Services       Social Determinants of Health (SDOH) Interventions     Readmission Risk Interventions Readmission Risk Prevention Plan 05/28/2019  Transportation Screening Complete  PCP or Specialist Appt within 5-7 Days Complete  Home Care Screening Complete  Medication Review (RN CM) Complete  Some recent data might be hidden

## 2020-04-19 LAB — GLUCOSE, CAPILLARY
Glucose-Capillary: 228 mg/dL — ABNORMAL HIGH (ref 70–99)
Glucose-Capillary: 242 mg/dL — ABNORMAL HIGH (ref 70–99)
Glucose-Capillary: 224 mg/dL — ABNORMAL HIGH (ref 70–99)
Glucose-Capillary: 237 mg/dL — ABNORMAL HIGH (ref 70–99)

## 2020-04-19 NOTE — Progress Notes (Signed)
Occupational Therapy Evaluation Patient Details Name: [name] MRN: [id number] DOB: 12-27-1955 Today's Date: 04/19/2020    History of Present Illness 65 yo male admitted on 04/12/20 with onset of fall and subsequent intertrochanteric fracture on RLE was taken to surgery for IM nailing on 04/14/20.  PMHx:  HTN, DM, diabetic foot ulcer, L transmet amp, back surgery, CTS,   Clinical Impression   [name] reported that prior to admission he often stayed with friends, and ambulated with a walker or cane. He used a manual wc a year ago however now his sister-in-law has his wc. He mentioned that he has some DME however was not consistent in his answers. [name] became agitated when asked to re-position in bed and transfer to sitting EOB, he refused to do either. With max physical assistance and vc he was able to reposition R knee into extension with pillow supporting foot in bed. Despite education and education, [name] is not agreeing to SNF level care and believes his son can pick him up, bring him home, and care for him. To discharge home [name] would benefit from non-emergent ambulance transport home. OT to follow acutely to increase functional mobility and ADLs. Due to limited mobility and vast change in function, OT recommends SNF level care.     Follow Up Recommendations  SNF    Equipment Recommendations  None recommended by OT       Precautions / Restrictions Precautions Precautions: Fall Precaution Comments: L transmet amp Restrictions Weight Bearing Restrictions: Yes RLE Weight Bearing: Touchdown weight bearing      Mobility Bed Mobility Overal bed mobility: Needs Assistance Bed Mobility: Rolling;Supine to Sit Rolling: Max assist;+2 for physical assistance;+2 for safety/equipment   Supine to sit: Max assist;+2 for physical assistance;+2 for safety/equipment Sit to supine: Max assist;+2 for physical assistance;+2 for safety/equipment        Transfers Overall transfer level: Needs  assistance     Sit to Stand: Max assist;+2 physical assistance;+2 safety/equipment         General transfer comment: Unable to get pt tp transfer this session.    Balance Overall balance assessment: Needs assistance Sitting-balance support: Bilateral upper extremity supported Sitting balance-Leahy Scale: Poor Sitting balance - Comments: Pt refused to transfer to sitting EOB this session                                   ADL either performed or assessed with clinical judgement   ADL Overall ADL's : Needs assistance/impaired Eating/Feeding: Set up   Grooming: Wash/dry hands;Oral care;Wash/dry face;Applying deodorant;Brushing hair;Set up;Bed level   Upper Body Bathing: Set up;Bed level   Lower Body Bathing: Maximal assistance;+2 for physical assistance;+2 for safety/equipment;Cueing for safety;Adhering to hip precautions;Sitting/lateral leans;Bed level   Upper Body Dressing : Moderate assistance;Bed level   Lower Body Dressing: Maximal assistance;+2 for physical assistance;+2 for safety/equipment;Adhering to hip precautions;Bed level   Toilet Transfer: Total assistance;+2 for physical assistance;+2 for safety/equipment;Adhering to hip precautions;Cueing for safety;Squat-pivot;Requires drop arm   Toileting- Clothing Manipulation and Hygiene: Total assistance;+2 for physical assistance;+2 for safety/equipment;Cueing for safety;Adhering to hip precautions       Functional mobility during ADLs:  (Pt refused to reposition or transfer EOB this session due to pain. Pt will required max-total A +2 for all functional mobility during ADLs) General ADL Comments: Pt requires mod-total A +2 for all ADLs due to refusal to trasnfer to sitting EOB or comlpete bed mobility.  Pertinent Vitals/Pain Pain Assessment: Faces Pain Score: 1  Faces Pain Scale: Hurts a little bit Pain Location: R hip Pain Descriptors / Indicators:  Constant;Discomfort;Guarding Pain Intervention(s): Limited activity within patient's tolerance     Hand Dominance Right   Extremity/Trunk Assessment Upper Extremity Assessment Upper Extremity Assessment: Overall WFL for tasks assessed   Lower Extremity Assessment Lower Extremity Assessment: RLE deficits/detail;LLE deficits/detail RLE Deficits / Details: Limited by pain and strength, due to bed positioning R knee dificult to move passively in extension. Pt refused to actively move right hip/RLE LLE Deficits / Details: Pt with transmet amputation of L foot   Cervical / Trunk Assessment Cervical / Trunk Assessment:  (Pt with reports of pain in L rib area, guarded and refused to re-position in bed due to pain)   Communication Communication Communication: No difficulties   Cognition Arousal/Alertness: Awake/alert Behavior During Therapy: Agitated Overall Cognitive Status: Impaired/Different from baseline Area of Impairment: Safety/judgement;Awareness;Problem solving                         Safety/Judgement: Decreased awareness of safety;Decreased awareness of deficits   Problem Solving: Slow processing General Comments: Pt reporting odd adn inconsistent things during session in regards to prior level of fucntion, plan after discharge, and information related to his fall stating that he was up and walking after his hip surgery   General Comments  Pt with conflicting reports, confused and agitated. L transmet amputation is healed well, R foot wrapped in gauze, R hip with incision sites. Pt with complaints of L rib pain however no apparent redness or bruising.    Exercises     Shoulder Instructions      Home Living Family/patient expects to be discharged to:: Private residence Living Arrangements: Alone Available Help at Discharge: Family;Friend(s) Type of Home: House Home Access:  (1 step to enter)     Home Layout: One level;Full bath on main level     Bathroom  Shower/Tub: Teacher, early years/pre: Standard Bathroom Accessibility:  (No grab bars in bathroom)   Home Equipment: Walker - 2 wheels;Bedside commode;Cane - single point;Wheelchair - manual   Additional Comments: Pt recalled multiple DME however is a poor historian, stated his sister in law has his manual wc.      Prior Functioning/Environment Level of Independence: Independent with assistive device(s)        Comments: Reports he used a wc within the home over one year ago, prior to admission he was using walker and cane to ambulate in the home and community        OT Problem List: Decreased strength;Decreased range of motion;Decreased activity tolerance;Impaired balance (sitting and/or standing);Decreased safety awareness;Decreased knowledge of use of DME or AE;Decreased knowledge of precautions;Pain      OT Treatment/Interventions: Self-care/ADL training;Therapeutic exercise;DME and/or AE instruction;Modalities;Therapeutic activities;Patient/family education;Balance training    OT Goals(Current goals can be found in the care plan section) Acute Rehab OT Goals Patient Stated Goal: to go home with his son's support OT Goal Formulation: With patient Time For Goal Achievement: 05/03/20 Potential to Achieve Goals: Fair ADL Goals Pt Will Perform Lower Body Bathing: with mod assist;with adaptive equipment;sit to/from stand Pt Will Perform Lower Body Dressing: with mod assist;with adaptive equipment;sit to/from stand Pt Will Transfer to Toilet: with mod assist;squat pivot transfer;bedside commode Additional ADL Goal #1: Pt will indepentendly recall 3/3 R hip precautions to ensure safe transition to next level of care.  OT Frequency: Min 2X/week  Barriers to D/C: Decreased caregiver support  Pt believes he can go home alone; per chart pt's son and daughter in-law are not able to provide appropriate level of support.          AM-PAC OT "6 Clicks" Daily Activity      Outcome Measure Help from another person eating meals?: A Little Help from another person taking care of personal grooming?: A Little Help from another person toileting, which includes using toliet, bedpan, or urinal?: A Lot Help from another person bathing (including washing, rinsing, drying)?: A Lot Help from another person to put on and taking off regular upper body clothing?: A Little Help from another person to put on and taking off regular lower body clothing?: A Lot 6 Click Score: 15   End of Session    Activity Tolerance: Patient limited by pain;Treatment limited secondary to agitation Patient left: in bed;with call bell/phone within reach;with bed alarm set (With pillow positioned under R foot to encourage knee extension)  OT Visit Diagnosis: Unsteadiness on feet (R26.81);Other abnormalities of gait and mobility (R26.89);Repeated falls (R29.6);History of falling (Z91.81);Other symptoms and signs involving cognitive function;Pain Pain - Right/Left: Right Pain - part of body: Hip                Time: 2409-7353 OT Time Calculation (min): 19 min Charges:  OT General Charges $OT Visit: 1 Visit OT Evaluation $OT Eval Low Complexity: 1 Low    Aletta Edmunds A Zhuri Krass 04/19/2020, 10:00 AM

## 2020-04-19 NOTE — TOC Progression Note (Signed)
Transition of Care [facility]) - Progression Note    Patient Details  Name: [name] MRN: [id number] Date of Birth: Feb 18, 1955  Transition of Care [facility]) CM/SW Contact  Sharin Mons, RN Phone Number: 04/19/2020, 3:56 PM  Clinical Narrative:    NCM shared bed offers with pt. Pt stated reluctantly agreeable to SNF placement. States he wants to be close to home. Selected [facility] in [place] after speaking with [facility] admission liaison, Jackelyn Poling.  Insurance authorization to be initiated by [employer].  TOC team will continue monitor and assist with TOC needs.   Expected Discharge Plan: [facility] SNF Rehab.) Barriers to Discharge: Insurance Authorization  Expected Discharge Plan and Services Expected Discharge Plan: [facility] SNF Rehab.)         Expected Discharge Date: 04/18/20                                     Social Determinants of Health (SDOH) Interventions    Readmission Risk Interventions Readmission Risk Prevention Plan 05/28/2019  Transportation Screening Complete  PCP or Specialist Appt within 5-7 Days Complete  Home Care Screening Complete  Medication Review (RN CM) Complete  Some recent data might be hidden

## 2020-04-19 NOTE — TOC Progression Note (Signed)
Transition of Care [facility]) - Progression Note    Patient Details  Name: [name] MRN: [id number] Date of Birth: September 01, 1955  Transition of Care [facility]) CM/SW [place] Phone Number: 04/19/2020, 10:24 AM  Clinical Narrative:     Pt now agreeable to SNF; csw complete FL2 and faxed bed requests in [place]  Expected Discharge Plan: [facility] Barriers to Discharge: Other (comment)  Expected Discharge Plan and Services Expected Discharge Plan: [place]         Expected Discharge Date: 04/18/20                                     Social Determinants of Health (SDOH) Interventions    Readmission Risk Interventions Readmission Risk Prevention Plan 05/28/2019  Transportation Screening Complete  PCP or Specialist Appt within 5-7 Days Complete  Home Care Screening Complete  Medication Review (RN CM) Complete  Some recent data might be hidden

## 2020-04-19 NOTE — [place] FL2 (Signed)
[place] MEDICAID FL2 LEVEL OF CARE SCREENING TOOL     IDENTIFICATION  Patient Name: [name] Birthdate: 04-Jul-1955 Sex: male Admission Date (Current Location): 04/12/2020  [facility] and [place] Number:  Herbalist and Address:  [facility] [place], [place] [address] [place], [place] [address]      Provider Number: [id number]  Attending Physician Name and Address:  Shelly Coss, MD  Relative Name and Phone Number:  Bryor, Rami)   [phone] [facility])    Current Level of Care: Hospital Recommended Level of Care: [place] Prior Approval Number:    Date Approved/Denied:   PASRR Number: [id number]  Discharge Plan: SNF    Current Diagnoses: Patient Active Problem List   Diagnosis Date Noted  . Closed intertrochanteric fracture of right hip, initial encounter ([place])   . Fx intertrochanteric hip ([place]) 04/13/2020  . Acute lower UTI 04/13/2020  . Anxiety 04/13/2020  . Agitation 04/13/2020  . Wound dehiscence 05/26/2019  . Dehiscence of amputation stump ([place])   . Wound infection   . Acute osteomyelitis of left foot ([place]) 04/11/2019  . Lactic acidosis 04/11/2019  . Sepsis due to Enterobacter ([place]) 04/11/2019  . Diabetic foot ulcer ([place]) 03/25/2019  . Idiopathic chronic gout of left foot without tophus   . Myelopathy ([place]) 07/16/2013  . Ingrown nail 07/05/2013  . Paronychia 07/05/2013  . Diverticulosis 04/22/2013  . Acute osteomyelitis of humerus ([place]) 02/07/2013  . Osteomyelitis of left foot ([place]) 02/01/2013  . Joint infection ([place]) 01/22/2013  . Pyogenic arthritis of shoulder region ([place]) 01/15/2013  . Nephrolithiasis 01/23/2012  . Lumbar radiculopathy 02/28/2011  . Acute gout of right knee 04/30/2010  . Chronic pain 02/14/2010  . NASH (nonalcoholic steatohepatitis) 11/08/2008  . Obesity 10/27/2008  . Hypertriglyceridemia 10/27/2007  . Uncontrolled type 2 diabetes mellitus with hyperglycemia, with long-term  current use of insulin ([place]) 03/12/2006  . Essential hypertension, benign 03/12/2006    Orientation RESPIRATION BLADDER Height & Weight     Self,Time,Situation,Place  Normal Continent Weight: 220 lb (99.8 kg) Height:  6' 3"  (190.5 cm)  BEHAVIORAL SYMPTOMS/MOOD NEUROLOGICAL BOWEL NUTRITION STATUS      Continent Diet (see d/c summary)  AMBULATORY STATUS COMMUNICATION OF NEEDS Skin   Extensive Assist Verbally Surgical wounds,PU Stage and Appropriate Care (Diabetic foot ulcer right; incision right foot; incision right leg)                       Personal Care Assistance Level of Assistance  Bathing,Feeding,Dressing Bathing Assistance: Maximum assistance Feeding assistance: Independent Dressing Assistance: Maximum assistance     Functional Limitations Info  Sight,Hearing,Speech Sight Info: Adequate Hearing Info: Adequate Speech Info: Adequate    SPECIAL CARE FACTORS FREQUENCY  PT (By licensed PT),OT (By licensed OT)     PT Frequency: 5x/week OT Frequency: 5x/week            Contractures Contractures Info: Not present    Additional Factors Info  Code Status,Allergies Code Status Info: DNR Allergies Info: Anti-snake venim for copperhead venom., prednisone, Pravastatin, Toradol (ketorolac Tromethamine)           Current Medications (04/19/2020):  This is the current hospital active medication list Current Facility-Administered Medications  Medication Dose Route Frequency Provider Last Rate Last Admin  . acetaminophen (TYLENOL) tablet 650 mg  650 mg Oral Q6H PRN Persons, Bevely Palmer, [place]   650 mg at 04/19/20 0147   Or  . acetaminophen (TYLENOL) suppository 650 mg  650  mg Rectal Q6H PRN Persons, Bevely Palmer, PA      . amitriptyline (ELAVIL) tablet 25 mg  25 mg Oral QHS Persons, Bevely Palmer, [place]   25 mg at 04/18/20 2009  . amLODipine (NORVASC) tablet 5 mg  5 mg Oral Daily Persons, Bevely Palmer, [place]   5 mg at 04/19/20 8676  . aspirin EC tablet 81 mg  81 mg Oral QODAY Persons, Bevely Palmer, [place]   81 mg at 04/19/20 7209  . Chlorhexidine Gluconate Cloth 2 % PADS 6 each  6 each Topical Daily Shelly Coss, MD   6 each at 04/19/20 [phone]  . folic acid (FOLVITE) tablet 1 mg  1 mg Oral Daily Persons, Bevely Palmer, [place]   1 mg at 04/19/20 6283  . haloperidol lactate (HALDOL) injection 2 mg  2 mg Intravenous Q6H PRN Persons, Bevely Palmer, PA   2 mg at 04/16/20 0007  . heparin injection 5,000 Units  5,000 Units Subcutaneous Q8H Persons, Bevely Palmer, PA   5,000 Units at 04/19/20 [phone]  . HYDROmorphone (DILAUDID) injection 1 mg  1 mg Intravenous Q3H PRN Persons, Bevely Palmer, PA   1 mg at 04/18/20 0811  . insulin aspart (novoLOG) injection 0-20 Units  0-20 Units Subcutaneous TID WC Persons, Bevely Palmer, [place]   7 Units at 04/19/20 [phone]  . insulin aspart (novoLOG) injection 0-5 Units  0-5 Units Subcutaneous QHS Persons, Bevely Palmer, [place]   3 Units at 04/17/20 2042  . insulin detemir (LEVEMIR) injection 50 Units  50 Units Subcutaneous QHS Persons, Bevely Palmer, [place]   50 Units at 04/18/20 2010  . lisinopril (ZESTRIL) tablet 40 mg  40 mg Oral Daily Persons, Bevely Palmer, [place]   40 mg at 04/19/20 4650  . menthol-cetylpyridinium (CEPACOL) lozenge 3 mg  1 lozenge Oral PRN Persons, Bevely Palmer, PA       Or  . phenol (CHLORASEPTIC) mouth spray 1 spray  1 spray Mouth/Throat PRN Persons, Bevely Palmer, PA      . metoCLOPramide (REGLAN) tablet 5-10 mg  5-10 mg Oral Q8H PRN Persons, Bevely Palmer, PA       Or  . metoCLOPramide (REGLAN) injection 5-10 mg  5-10 mg Intravenous Q8H PRN Persons, Bevely Palmer, [place]      . metoprolol succinate (TOPROL-XL) 24 hr tablet 100 mg  100 mg Oral Daily Florencia Reasons, MD   100 mg at 04/19/20 3546  . multivitamin with minerals tablet 1 tablet  1 tablet Oral Daily Persons, Bevely Palmer, [place]   1 tablet at 04/19/20 [phone]  . mupirocin ointment (BACTROBAN) 2 % 1 application  1 application Nasal BID Shelly Coss, MD   1 application at 27/51/70 [phone]  . ondansetron (ZOFRAN) tablet 4 mg  4 mg Oral Q6H PRN Persons, Bevely Palmer, PA        Or  . ondansetron [facility]) injection 4 mg  4 mg Intravenous Q6H PRN Persons, Bevely Palmer, [place]      . oxyCODONE (Oxy IR/ROXICODONE) immediate release tablet 10 mg  10 mg Oral Q6H PRN Florencia Reasons, MD   10 mg at 04/19/20 0358  . pentoxifylline (TRENTAL) CR tablet 400 mg  400 mg Oral TID WC Persons, Bevely Palmer, PA   400 mg at 04/19/20 9449  . polyethylene glycol (MIRALAX / GLYCOLAX) packet 17 g  17 g Oral Daily PRN Persons, Bevely Palmer, PA      . senna-docusate (Senokot-S) tablet 1 tablet  1 tablet Oral BID Florencia Reasons, MD   1 tablet  at 04/19/20 6394  . sulfamethoxazole-trimethoprim (BACTRIM DS) 800-160 MG per tablet 1 tablet  1 tablet Oral Q12H Florencia Reasons, MD   1 tablet at 04/19/20 [phone]  . thiamine tablet 100 mg  100 mg Oral Daily Persons, Bevely Palmer, PA   100 mg at 04/19/20 3794   Or  . thiamine (B-1) injection 100 mg  100 mg Intravenous Daily Persons, Bevely Palmer, PA      . vitamin B-12 (CYANOCOBALAMIN) tablet 1,000 mcg  1,000 mcg Oral Daily Florencia Reasons, MD   1,000 mcg at 04/19/20 4461     Discharge Medications: Please see discharge summary for a list of discharge medications.  Relevant Imaging Results:  Relevant Lab Results:   Additional Information SSN [place], [place]

## 2020-04-19 NOTE — Progress Notes (Signed)
Patient is lying in bed comfortable and awake this morning.  He states that he could not go home last night because "they should have realized the women would not be able to get me into the car "he has been getting to the bathroom on his own.  He does want to discharge as soon as possible he is working per his report on getting a nonemergent medical transport to [place] which she thinks his insurance will pay for.  Right hip wound dressing in place thigh is soft dressing on foot as well no drainage no ascending cellulitis

## 2020-04-19 NOTE — Plan of Care (Signed)

## 2020-04-19 NOTE — Progress Notes (Signed)
Physical Therapy Treatment Patient Details Name: Volney Reierson MRN: [id number] DOB: 1955-02-10 Today's Date: 04/19/2020    History of Present Illness 65 yo male admitted on 04/12/20 with onset of fall and subsequent intertrochanteric fracture on RLE was taken to surgery for IM nailing on 04/14/20.  PMHx:  HTN, DM, diabetic foot ulcer, L transmet amp, back surgery, CTS,    PT Comments    Pt supine in bed this session, undressed from waist down and requested to put on his sweat pants.  Assisted into standing this session to donn sweat pants.  He is unable to maintain weight bearing and presents with PWB this session.  Further mobility deferred due to innability to maintain weight bearing.  Pt making improved functional gains this session.     Follow Up Recommendations  SNF     Equipment Recommendations  Rolling walker with 5" wheels;Wheelchair (measurements PT);Wheelchair cushion (measurements PT);Hospital bed;3in1 (PT);Other (comment) (ambulance transport if he refuses snf.)    Recommendations for Other Services       Precautions / Restrictions Precautions Precautions: Fall Precaution Comments: L transmet amp Restrictions Weight Bearing Restrictions: Yes RLE Weight Bearing: Touchdown weight bearing RLE Partial Weight Bearing Percentage or Pounds: TDWB but demonstrated PWB this session.    Mobility  Bed Mobility Overal bed mobility: Needs Assistance Bed Mobility: Supine to Sit;Sit to Supine     Supine to sit: Mod assist;+2 for safety/equipment;+2 for physical assistance Sit to supine: Min assist   General bed mobility comments: Mod +2 to move to edge of bed for trunk elevation and advancement of RLE to edge of bed.  For back to bed patient required min assistance to lift RLE for back to bed.    Transfers Overall transfer level: Needs assistance Equipment used: Rolling walker (2 wheeled) Transfers: Sit to/from Stand Sit to Stand: Mod assist;+2 physical assistance;From  elevated surface         General transfer comment: Pt performed transfer into standing with mod +2 assistance to donn sweat pants.  Pt unable to maintain TDWB and presents with PWB.  Pt then sat back down to edge of bed.  Ambulation/Gait                 Stairs             Wheelchair Mobility    Modified Rankin (Stroke Patients Only)       Balance Overall balance assessment: Needs assistance   Sitting balance-Leahy Scale: Poor Sitting balance - Comments: improved to fair as sitting edge of bed.     Standing balance-Leahy Scale: Poor Standing balance comment: BUE support and external assistance.                            Cognition Arousal/Alertness: Awake/alert Behavior During Therapy: WFL for tasks assessed/performed Overall Cognitive Status: Impaired/Different from baseline                           Safety/Judgement: Decreased awareness of safety;Decreased awareness of deficits   Problem Solving: Slow processing General Comments: Pt unable to follow commands for weight bearing but did make it to his feet today to stand.      Exercises      General Comments        Pertinent Vitals/Pain Pain Assessment: Faces Faces Pain Scale: Hurts even more Pain Location: R hip Pain Descriptors / Indicators: Constant;Discomfort;Guarding Pain Intervention(s): Monitored during session;Repositioned  Home Living                      Prior Function            PT Goals (current goals can now be found in the care plan section) Acute Rehab PT Goals Patient Stated Goal: to go home with his son's support Potential to Achieve Goals: Fair Progress towards PT goals: Progressing toward goals    Frequency    Min 5X/week      PT Plan Current plan remains appropriate    Co-evaluation              AM-PAC PT "6 Clicks" Mobility   Outcome Measure  Help needed turning from your back to your side while in a flat bed  without using bedrails?: A Lot Help needed moving from lying on your back to sitting on the side of a flat bed without using bedrails?: A Lot Help needed moving to and from a bed to a chair (including a wheelchair)?: A Lot Help needed standing up from a chair using your arms (e.g., wheelchair or bedside chair)?: A Lot Help needed to walk in hospital room?: Total Help needed climbing 3-5 steps with a railing? : Total 6 Click Score: 10    End of Session Equipment Utilized During Treatment: Gait belt Activity Tolerance: Patient limited by pain Patient left: in bed;with call bell/phone within reach;with bed alarm set Nurse Communication: Mobility status;Need for lift equipment Pain - Right/Left: Right Pain - part of body: Hip     Time: 6433-2951 PT Time Calculation (min) (ACUTE ONLY): 17 min  Charges:  $Therapeutic Activity: 8-22 mins                     Erasmo Leventhal , PTA Acute Rehabilitation Services Pager [phone] Office [phone]     Kaizer Dissinger Eli Hose 04/19/2020, 4:20 PM

## 2020-04-19 NOTE — Progress Notes (Addendum)
Patient seen and examined the bedside this morning.  Hemodynamically stable.  He was discharged yesterday with plan to go home to [place] with his daughter-in-law.  Apparently he was not able to stand on his feet, so discharge could not happen.  Patient now is realizing that he might need skilled nursing facility and looks like he is agreeable. There is no change in the medical management.  Patient is medically stable for discharge whenever skilled nursing facility bed is available. I called his daughter Roanna Epley on her phone, phone not received

## 2020-04-20 LAB — GLUCOSE, CAPILLARY
Glucose-Capillary: 170 mg/dL — ABNORMAL HIGH (ref 70–99)
Glucose-Capillary: 146 mg/dL — ABNORMAL HIGH (ref 70–99)
Glucose-Capillary: 164 mg/dL — ABNORMAL HIGH (ref 70–99)

## 2020-04-20 MED ORDER — OXYCODONE HCL 10 MG PO TABS: 10.0000 mg | ORAL_TABLET | Freq: Four times a day (QID) | ORAL | 0 refills | Status: DC | PRN

## 2020-04-20 NOTE — Progress Notes (Signed)
Patient seen and examined the bedside this morning.  Hemodynamically stable.  Comfortable.  No new change in the medical management. He is waiting for skilled nursing facility.  Can be discharged to SNF as soon  as bed is available.

## 2020-04-20 NOTE — Progress Notes (Signed)
Physical Therapy Treatment Patient Details Name: [name] MRN: [id number] DOB: 08-28-1955 Today's Date: 04/20/2020    History of Present Illness 65 yo male admitted on 04/12/20 with onset of fall and subsequent intertrochanteric fracture on RLE was taken to surgery for IM nailing on 04/14/20.  PMHx:  HTN, DM, diabetic foot ulcer, L transmet amp, back surgery, CTS,    PT Comments    Pt supine in bed on arrival.  Pt remains confused to situation and time.  He reports he needs to have a BM so transferred OOB to commode at bedside.  Pt continues to require mod-max +2.  Pt remains to benefit from snf placement.  He was cold and clammy at end of session.  BP 132/67 and temp 98.5.  RN refused to check blood glucose.  Pt waiting for transport to snf.      Follow Up Recommendations  SNF     Equipment Recommendations  Rolling walker with 5" wheels;Wheelchair (measurements PT);Wheelchair cushion (measurements PT);Hospital bed;3in1 (PT);Other (comment) (ambulance transport if he refuses snf)    Recommendations for Other Services       Precautions / Restrictions Precautions Precautions: Fall Precaution Comments: L transmet amp Restrictions Weight Bearing Restrictions: Yes RLE Weight Bearing: Touchdown weight bearing RLE Partial Weight Bearing Percentage or Pounds: TDWB but continues to demonstrate PWB so unable to progress to gt training.    Mobility  Bed Mobility Overal bed mobility: Needs Assistance Bed Mobility: Supine to Sit;Sit to Supine     Supine to sit: Mod assist;+2 for safety/equipment;+2 for physical assistance     General bed mobility comments: Mod +2 to move to edge of bed for trunk elevation and advancement of RLE to edge of bed.  For back to bed patient required min assistance to lift RLE for back to bed.    Transfers Overall transfer level: Needs assistance Equipment used: Rolling walker (2 wheeled) Transfers: Sit to/from [facility] Sit to Stand:  Mod assist;+2 physical assistance;From elevated surface Stand pivot transfers: Mod assist;+2 physical assistance       General transfer comment: Pt performed transfer into standing with mod +2 assistance.  Pt unable to maintain TDWB and presents with PWB.  Pt then turned to sit on commode to his good side.  Ambulation/Gait                 Stairs             Wheelchair Mobility    Modified Rankin (Stroke Patients Only)       Balance Overall balance assessment: Needs assistance Sitting-balance support: Bilateral upper extremity supported Sitting balance-Leahy Scale: Poor Sitting balance - Comments: improved to fair as sitting edge of bed.   Standing balance support: Bilateral upper extremity supported;During functional activity Standing balance-Leahy Scale: Poor Standing balance comment: BUE support and external assistance.                            Cognition Arousal/Alertness: Awake/alert Behavior During Therapy: WFL for tasks assessed/performed Overall Cognitive Status: Impaired/Different from baseline Area of Impairment: Safety/judgement;Awareness;Problem solving                         Safety/Judgement: Decreased awareness of safety;Decreased awareness of deficits   Problem Solving: Slow processing General Comments: Pt unable to follow commands for weight bearing but did make it to his feet today to stand and turn to sit on commode to  have a BM.      Exercises      General Comments        Pertinent Vitals/Pain Pain Assessment: Faces Faces Pain Scale: Hurts even more Pain Location: R hip Pain Descriptors / Indicators: Constant;Discomfort;Guarding Pain Intervention(s): Monitored during session;Repositioned    Home Living                      Prior Function            PT Goals (current goals can now be found in the care plan section) Acute Rehab PT Goals Patient Stated Goal: to go home with his son's  support Potential to Achieve Goals: Fair Progress towards PT goals: Progressing toward goals    Frequency    Min 5X/week      PT Plan Current plan remains appropriate    Co-evaluation              AM-PAC PT "6 Clicks" Mobility   Outcome Measure                   End of Session Equipment Utilized During Treatment: Gait belt Activity Tolerance: Patient limited by pain Patient left: in bed;with call bell/phone within reach;with bed alarm set Nurse Communication: Mobility status;Need for lift equipment (reports feeling dizzy and presents with cold clammy skin after  toileting.) PT Visit Diagnosis: Unsteadiness on feet (R26.81);Muscle weakness (generalized) (M62.81);Pain Pain - Right/Left: Right Pain - part of body: Hip     Time: 7615-1834 PT Time Calculation (min) (ACUTE ONLY): 35 min  Charges:  $Therapeutic Activity: 23-37 mins                     [name] , PTA Acute Rehabilitation Services Pager [phone] Office [phone]     [name] 04/20/2020, 3:23 PM

## 2020-04-20 NOTE — Progress Notes (Signed)
Report given to [place] at [facility]. All questions and concerns werre fully answered.

## 2020-04-20 NOTE — Progress Notes (Signed)
Discharge summary packet/pertinent documents provided to [facility] staff. Pt remains alert/oriented in no apparent distress. D/C to [facility] SNF at [facility] as ordered.

## 2020-04-20 NOTE — TOC Transition Note (Signed)
Transition of Care [facility]) - CM/SW Discharge Note   Patient Details  Name: [name] MRN: [id number] Date of Birth: 26-Jan-1955  Transition of [facility]) CM/SW Contact:  Bethann Berkshire, [place] Phone Number: 04/20/2020, 1:42 PM   Clinical Narrative:     CSW is informed by Fortunato Curling that pt has received auth.   Patient will DC to: [facility] Anticipated DC date: 04/20/20 Family notified: Pt states he will notify his daughter Transport by: Corey Harold   Per MD patient ready for DC to [facility] . RN, patient, patient's family, and facility notified of DC. Discharge Summary and FL2 sent to facility. RN to call report prior to discharge [phone] Room B22 Bed 1). DC packet on chart. Ambulance transport requested for patient.   CSW will sign off for now as social work intervention is no longer needed. Please consult [place] again if new needs arise.   Final next level of care: Skilled Nursing Facility Barriers to Discharge: No Barriers Identified   Patient Goals and CMS Choice Patient states their goals for this hospitalization and ongoing recovery are:: Pt agreeable to SNF and expresses needing to get better so he can go home      Discharge Placement              Patient chooses bed at:  [facility]) Patient to be transferred to facility by: [facility]   Patient and family notified of of transfer: 04/20/20  Discharge Plan and Services                                     Social Determinants of Health (SDOH) Interventions     Readmission Risk Interventions Readmission Risk Prevention Plan 05/28/2019  Transportation Screening Complete  PCP or Specialist Appt within 5-7 Days Complete  Home Care Screening Complete  Medication Review (RN CM) Complete  Some recent data might be hidden

## 2020-04-20 NOTE — Plan of Care (Signed)
Patient is here waiting for SNF placement. Was supposed to be discharged on 4/5 but could not get in the car and realized that SNF is the best option for him. Decided on [facility], waiting for insurance auth. Pain controlled with prn meds as ordered. Got an IV inserted by the IV team earlier in my shift but was unintentionally removed by patient around 3 am. NAD at this time, will continue to monitor and continue current POC.

## 2020-04-24 ENCOUNTER — Telehealth: Payer: Self-pay

## 2020-04-24 NOTE — Telephone Encounter (Signed)
Pt would like to speak with [name] regarding his surgery and his upcoming appt.

## 2020-04-24 NOTE — Telephone Encounter (Signed)
Pt is s/p IM nail intertroch hip fx 04/14/20. Pt wanted to know if [name] was going to come to the SNF to see him and I advised that he would come to our office. He states that he is ready to go home and does not want to lay around the facility any longer. I advised that we would look at his incision and make sure that he was healing properly but the decision would be up to physical therapy. Pt voiced understanding and will call with any other questions.

## 2020-04-27 ENCOUNTER — Ambulatory Visit (INDEPENDENT_AMBULATORY_CARE_PROVIDER_SITE_OTHER): Payer: Medicaid Other

## 2020-04-27 ENCOUNTER — Ambulatory Visit (INDEPENDENT_AMBULATORY_CARE_PROVIDER_SITE_OTHER): Payer: Medicaid Other | Admitting: Physician Assistant

## 2020-04-27 ENCOUNTER — Encounter: Payer: Self-pay | Admitting: Orthopedic Surgery

## 2020-04-27 DIAGNOSIS — M25551 Pain in right hip: Secondary | ICD-10-CM | POA: Diagnosis not present

## 2020-04-27 NOTE — Progress Notes (Signed)
Office Visit Note   Patient: [name]           Date of Birth: 1955/05/22           MRN: [id number] Visit Date: 04/27/2020              Requested by: [name] [facility] [place],  [place] [address] PCP: [facility]  Chief Complaint  Patient presents with  . Right Hip - Pain, Routine Post Op      HPI: Patient is almost 2 weeks status post intramedullary rodding of right intertrochanteric hip fracture.  He is at a nursing facility.  He is touchdown weightbearing Assessment & Plan: Visit Diagnoses:  1. Pain in right hip     Plan: Follow-up in 2 weeks.  Hopefully could possibly advance his weightbearing at that time but more likely in 6 weeks.  Repeat x-rays when he returns  Follow-Up Instructions: No follow-ups on file.   Ortho Exam  Patient is alert, oriented, no adenopathy, well-dressed, normal affect, normal respiratory effort. Examination demonstrates well-healed surgical incision surgical staples were removed today calves are soft and nontender he has good plantar flexion dorsiflexion no erythema no  Imaging: XR HIP UNILAT W OR W/O PELVIS 2-3 VIEWS RIGHT  Result Date: 04/27/2020 X-rays of his hip and pelvis demonstrate intramedullary rod fracture reduced in good position.  No images are attached to the encounter.  Labs: Lab Results  Component Value Date   HGBA1C 7.2 (H) 04/13/2020   HGBA1C 6.0 (H) 05/26/2019   HGBA1C 7.2 (H) 03/25/2019   ESRSEDRATE 81 (H) 03/25/2019   CRP 1.4 (H) 04/11/2019   CRP 1.9 (H) 03/25/2019   LABURIC 5.6 04/13/2019   REPTSTATUS 04/16/2020 FINAL 04/14/2020   GRAMSTAIN  03/27/2019    RARE WBC PRESENT,BOTH PMN AND MONONUCLEAR NO ORGANISMS SEEN    CULT 50,000 COLONIES/mL ENTEROBACTER AEROGENES (A) 04/14/2020   LABORGA ENTEROBACTER AEROGENES (A) 04/14/2020     Lab Results  Component Value Date   ALBUMIN 2.9 (L) 04/13/2020   ALBUMIN 3.0 (L) 04/13/2020   ALBUMIN 3.0 (L)  04/12/2019   PREALBUMIN 17.6 (L) 03/25/2019    Lab Results  Component Value Date   MG 1.8 04/17/2020   MG 1.9 04/16/2020   MG 1.9 04/13/2020   No results found for: VD25OH  Lab Results  Component Value Date   PREALBUMIN 17.6 (L) 03/25/2019   CBC EXTENDED Latest Ref Rng & Units 04/17/2020 04/16/2020 04/15/2020  WBC 4.0 - 10.5 K/uL 9.5 9.6 10.7(H)  RBC 4.22 - 5.81 MIL/uL 2.63(L) 2.64(L) 2.61(L)  HGB 13.0 - 17.0 g/dL 8.2(L) 8.3(L) 8.3(L)  HCT 39.0 - 52.0 % 24.3(L) 24.1(L) 24.2(L)  PLT 150 - 400 K/uL 311 264 267  NEUTROABS 1.7 - 7.7 K/uL - - -  LYMPHSABS 0.7 - 4.0 K/uL - - -     There is no height or weight on file to calculate BMI.  Orders:  Orders Placed This Encounter  Procedures  . XR HIP UNILAT W OR W/O PELVIS 2-3 VIEWS RIGHT   No orders of the defined types were placed in this encounter.    Procedures: No procedures performed  Clinical Data: No additional findings.  ROS:  All other systems negative, except as noted in the HPI. Review of Systems  Objective: Vital Signs: There were no vitals taken for this visit.  Specialty Comments:  No specialty comments available.  PMFS History: Patient Active Problem List   Diagnosis Date Noted  .  Closed intertrochanteric fracture of right hip, initial encounter ([place])   . Fx intertrochanteric hip ([place]) 04/13/2020  . Acute lower UTI 04/13/2020  . Anxiety 04/13/2020  . Agitation 04/13/2020  . Wound dehiscence 05/26/2019  . Dehiscence of amputation stump ([place])   . Wound infection   . Acute osteomyelitis of left foot ([place]) 04/11/2019  . Lactic acidosis 04/11/2019  . Sepsis due to Enterobacter ([place]) 04/11/2019  . Diabetic foot ulcer ([place]) 03/25/2019  . Idiopathic chronic gout of left foot without tophus   . Myelopathy ([place]) 07/16/2013  . Ingrown nail 07/05/2013  . Paronychia 07/05/2013  . Diverticulosis 04/22/2013  . Acute osteomyelitis of humerus ([place]) 02/07/2013  . Osteomyelitis of left foot ([place]) 02/01/2013  .  Joint infection ([place]) 01/22/2013  . Pyogenic arthritis of shoulder region ([place]) 01/15/2013  . Nephrolithiasis 01/23/2012  . Lumbar radiculopathy 02/28/2011  . Acute gout of right knee 04/30/2010  . Chronic pain 02/14/2010  . NASH (nonalcoholic steatohepatitis) 11/08/2008  . Obesity 10/27/2008  . Hypertriglyceridemia 10/27/2007  . Uncontrolled type 2 diabetes mellitus with hyperglycemia, with long-term current use of insulin ([place]) 03/12/2006  . Essential hypertension, benign 03/12/2006   Past Medical History:  Diagnosis Date  . Arthritis   . Back pain   . Diabetes mellitus without complication ([place])   . Diabetic foot ulcer ([place]) 03/2019  . History of kidney stones   . Hypertension     Family History  Problem Relation Age of Onset  . Heart failure Mother   . Cancer Father     Past Surgical History:  Procedure Laterality Date  . BACK SURGERY    . CARPAL TUNNEL RELEASE    . CHOLECYSTECTOMY    . FOOT SURGERY    . INTRAMEDULLARY (IM) NAIL INTERTROCHANTERIC Right 04/14/2020   Procedure: INTRAMEDULLARY (IM) NAIL INTERTROCHANTRIC;  Surgeon: [name], MD;  Location: [facility];  Service: Orthopedics;  Laterality: Right;  . INTRAMEDULLARY (IM) NAIL INTERTROCHANTRIC (Right Leg Upper)  04/14/2020  . JOINT REPLACEMENT    . REVISION OF TRANSMETATARSAL AMPUTATION Right 05/26/2019  . SHOULDER ARTHROSCOPY    . STUMP REVISION Left 05/26/2019   Procedure: REVISION LEFT TRANSMETATARSAL AMPUTATION;  Surgeon: [name], MD;  Location: [facility];  Service: Orthopedics;  Laterality: Left;  . TOTAL KNEE ARTHROPLASTY    . TRANSMETATARSAL AMPUTATION Left 03/27/2019   Procedure: TRANSMETATARSAL AMPUTATION left foot;  Surgeon: [name], MD;  Location: [facility];  Service: Orthopedics;  Laterality: Left;   Social History   Occupational History  . Not on file  Tobacco Use  . Smoking status: Never Smoker  . Smokeless tobacco: Never Used  Vaping Use  . Vaping Use: Never used  Substance and Sexual  Activity  . Alcohol use: No  . Drug use: No  . Sexual activity: Not on file

## 2020-05-11 ENCOUNTER — Ambulatory Visit (INDEPENDENT_AMBULATORY_CARE_PROVIDER_SITE_OTHER): Payer: Medicaid Other | Admitting: Physician Assistant

## 2020-05-11 ENCOUNTER — Encounter: Payer: Self-pay | Admitting: Orthopedic Surgery

## 2020-05-11 ENCOUNTER — Ambulatory Visit (INDEPENDENT_AMBULATORY_CARE_PROVIDER_SITE_OTHER): Payer: Medicaid Other

## 2020-05-11 DIAGNOSIS — M25551 Pain in right hip: Secondary | ICD-10-CM

## 2020-05-11 DIAGNOSIS — M25561 Pain in right knee: Secondary | ICD-10-CM

## 2020-05-11 DIAGNOSIS — S2231XA Fracture of one rib, right side, initial encounter for closed fracture: Secondary | ICD-10-CM

## 2020-05-11 NOTE — Addendum Note (Signed)
Addended by: Pamella Pert on: 05/11/2020 02:23 PM   Modules accepted: Orders

## 2020-05-11 NOTE — Progress Notes (Signed)
Office Visit Note   Patient: [name]           Date of Birth: 1955/02/18           MRN: [id number] Visit Date: 05/11/2020              Requested by: [name] [facility] [place],  [place] [address] PCP: [facility], [facility]  Chief Complaint  Patient presents with  . Right Hip - Routine Post Op    04/14/20 IM nail right intertroch hip fx   . Right Knee - Pain      HPI: Pleasant 65 year old gentleman who is 4 weeks status post ORIF of a right intertrochanteric hip fracture overall he is doing fairly well.  He has been maintaining his weightbearing restrictions.  He is complaining of right knee pain.  He has a long history of gout and he states this is what it feels like.  He has not been receiving colchicine at the nursing facility from looks of the records since about 10 days ago.  He is receiving allopurinol  Assessment & Plan: Visit Diagnoses:  1. Closed fracture of one rib of right side, initial encounter   2. Pain in right hip   3. Acute pain of right knee     Plan: Have recommended resumption of colchicine for his gouty symptoms we will draw a uric acid today may begin weightbearing in 2 weeks.  Follow-up in 3 weeks new x-rays of his hip should be taken at that visit  Follow-Up Instructions: No follow-ups on file.   Ortho Exam  Patient is alert, oriented, no adenopathy, well-dressed, normal affect, normal respiratory effort. Right hip well-healed surgical incision some mild tenderness with internal and external rotation.  Compartments are soft and nontender distal motor function is intact.  Right knee no if effusion no cellulitis he does have significant pain with range of motion compartments are soft and compressible no evidence of an infective process  Imaging: XR HIP UNILAT W OR W/O PELVIS 2-3 VIEWS RIGHT  Result Date: 05/11/2020 Views of his hip and pelvis demonstrate intramedullary rodding of a right  intertrochanteric hip fracture in stable good position  XR Knee 1-2 Views Right  Result Date: 05/11/2020 2 views of his right knee demonstrate tricompartmental arthritis with osteophyte formation overall well-maintained alignment  No images are attached to the encounter.  Labs: Lab Results  Component Value Date   HGBA1C 7.2 (H) 04/13/2020   HGBA1C 6.0 (H) 05/26/2019   HGBA1C 7.2 (H) 03/25/2019   ESRSEDRATE 81 (H) 03/25/2019   CRP 1.4 (H) 04/11/2019   CRP 1.9 (H) 03/25/2019   LABURIC 5.6 04/13/2019   REPTSTATUS 04/16/2020 FINAL 04/14/2020   GRAMSTAIN  03/27/2019    RARE WBC PRESENT,BOTH PMN AND MONONUCLEAR NO ORGANISMS SEEN    CULT 50,000 COLONIES/mL ENTEROBACTER AEROGENES (A) 04/14/2020   LABORGA ENTEROBACTER AEROGENES (A) 04/14/2020     Lab Results  Component Value Date   ALBUMIN 2.9 (L) 04/13/2020   ALBUMIN 3.0 (L) 04/13/2020   ALBUMIN 3.0 (L) 04/12/2019   PREALBUMIN 17.6 (L) 03/25/2019    Lab Results  Component Value Date   MG 1.8 04/17/2020   MG 1.9 04/16/2020   MG 1.9 04/13/2020   No results found for: VD25OH  Lab Results  Component Value Date   PREALBUMIN 17.6 (L) 03/25/2019   CBC EXTENDED Latest Ref Rng & Units 04/17/2020 04/16/2020 04/15/2020  WBC 4.0 - 10.5 K/uL 9.5 9.6 10.7(H)  RBC  4.22 - 5.81 MIL/uL 2.63(L) 2.64(L) 2.61(L)  HGB 13.0 - 17.0 g/dL 8.2(L) 8.3(L) 8.3(L)  HCT 39.0 - 52.0 % 24.3(L) 24.1(L) 24.2(L)  PLT 150 - 400 K/uL 311 264 267  NEUTROABS 1.7 - 7.7 K/uL - - -  LYMPHSABS 0.7 - 4.0 K/uL - - -     There is no height or weight on file to calculate BMI.  Orders:  Orders Placed This Encounter  Procedures  . XR HIP UNILAT W OR W/O PELVIS 2-3 VIEWS RIGHT  . XR Knee 1-2 Views Right   No orders of the defined types were placed in this encounter.    Procedures: No procedures performed  Clinical Data: No additional findings.  ROS:  All other systems negative, except as noted in the HPI. Review of Systems  Objective: Vital Signs:  There were no vitals taken for this visit.  Specialty Comments:  No specialty comments available.  PMFS History: Patient Active Problem List   Diagnosis Date Noted  . Closed intertrochanteric fracture of right hip, initial encounter ([place])   . Fx intertrochanteric hip ([place]) 04/13/2020  . Acute lower UTI 04/13/2020  . Anxiety 04/13/2020  . Agitation 04/13/2020  . Wound dehiscence 05/26/2019  . Dehiscence of amputation stump ([place])   . Wound infection   . Acute osteomyelitis of left foot ([place]) 04/11/2019  . Lactic acidosis 04/11/2019  . Sepsis due to Enterobacter ([place]) 04/11/2019  . Diabetic foot ulcer ([place]) 03/25/2019  . Idiopathic chronic gout of left foot without tophus   . Myelopathy ([place]) 07/16/2013  . Ingrown nail 07/05/2013  . Paronychia 07/05/2013  . Diverticulosis 04/22/2013  . Acute osteomyelitis of humerus ([place]) 02/07/2013  . Osteomyelitis of left foot ([place]) 02/01/2013  . Joint infection ([place]) 01/22/2013  . Pyogenic arthritis of shoulder region ([place]) 01/15/2013  . Nephrolithiasis 01/23/2012  . Lumbar radiculopathy 02/28/2011  . Acute gout of right knee 04/30/2010  . Chronic pain 02/14/2010  . NASH (nonalcoholic steatohepatitis) 11/08/2008  . Obesity 10/27/2008  . Hypertriglyceridemia 10/27/2007  . Uncontrolled type 2 diabetes mellitus with hyperglycemia, with long-term current use of insulin ([place]) 03/12/2006  . Essential hypertension, benign 03/12/2006   Past Medical History:  Diagnosis Date  . Arthritis   . Back pain   . Diabetes mellitus without complication ([place])   . Diabetic foot ulcer ([place]) 03/2019  . History of kidney stones   . Hypertension     Family History  Problem Relation Age of Onset  . Heart failure Mother   . Cancer Father     Past Surgical History:  Procedure Laterality Date  . BACK SURGERY    . CARPAL TUNNEL RELEASE    . CHOLECYSTECTOMY    . FOOT SURGERY    . INTRAMEDULLARY (IM) NAIL INTERTROCHANTERIC Right 04/14/2020   Procedure:  INTRAMEDULLARY (IM) NAIL INTERTROCHANTRIC;  Surgeon: [name], MD;  Location: [facility];  Service: Orthopedics;  Laterality: Right;  . INTRAMEDULLARY (IM) NAIL INTERTROCHANTRIC (Right Leg Upper)  04/14/2020  . JOINT REPLACEMENT    . REVISION OF TRANSMETATARSAL AMPUTATION Right 05/26/2019  . SHOULDER ARTHROSCOPY    . STUMP REVISION Left 05/26/2019   Procedure: REVISION LEFT TRANSMETATARSAL AMPUTATION;  Surgeon: [name], MD;  Location: [facility];  Service: Orthopedics;  Laterality: Left;  . TOTAL KNEE ARTHROPLASTY    . TRANSMETATARSAL AMPUTATION Left 03/27/2019   Procedure: TRANSMETATARSAL AMPUTATION left foot;  Surgeon: [name], MD;  Location: [facility];  Service: Orthopedics;  Laterality: Left;   Social History  Occupational History  . Not on file  Tobacco Use  . Smoking status: Never Smoker  . Smokeless tobacco: Never Used  Vaping Use  . Vaping Use: Never used  Substance and Sexual Activity  . Alcohol use: No  . Drug use: No  . Sexual activity: Not on file

## 2020-05-12 LAB — URIC ACID: Uric Acid, Serum: 7 mg/dL (ref 4.0–8.0)

## 2020-05-15 ENCOUNTER — Telehealth: Payer: Self-pay

## 2020-05-15 NOTE — Telephone Encounter (Signed)
Called pt and lm on vm to advise of lab results. Will hold this message and try to reach again.

## 2020-05-18 ENCOUNTER — Telehealth: Payer: Self-pay | Admitting: Orthopedic Surgery

## 2020-05-18 ENCOUNTER — Telehealth: Payer: Self-pay

## 2020-05-18 NOTE — Telephone Encounter (Signed)
[name] with [facility] called stating she missed a call from [name] and would like to be tried again.   [name]# [phone]

## 2020-05-18 NOTE — Telephone Encounter (Signed)
Zanette from [facility] called she is requesting a call back regarding patients last visit she wants to know Dr.Duda is still following up with his foot zanette stated the AVS notes listed information about the patients hip call [phone]

## 2020-05-18 NOTE — Telephone Encounter (Signed)
I called and lm on vm after several transferes at SNF to advise yes we will continue to take care of pt's foot but also to advise of elevated uric acid and to make sure the pt is taking his allopurinol. Will hold this message and await return call from SNF

## 2020-05-18 NOTE — Telephone Encounter (Signed)
I called and lm foe nurse at [facility] to call back.

## 2020-05-19 ENCOUNTER — Telehealth: Payer: Self-pay | Admitting: Physician Assistant

## 2020-05-19 NOTE — Telephone Encounter (Signed)
Patient called needing medication called in for Gout. The number to contact patient is [phone]

## 2020-05-19 NOTE — Telephone Encounter (Signed)
I spoke with nurse and she advised that the pt was to follow up for his right foot that did have an ulcer that was present before his hip surgery. Advised that I would make a note in the pt's chart and make sure to see this at next visit at 06/01/2020. She states that the pt is doing well abx soap and dry dressing. She also confirmed that the pt is taking his allopurinol 300 mg. Will call with any other questions.

## 2020-05-19 NOTE — Telephone Encounter (Signed)
This has been done.

## 2020-05-19 NOTE — Telephone Encounter (Signed)
I called and sw pt and with nurse appt moved up to 05/24/20 will discuss at office visit.

## 2020-05-19 NOTE — Telephone Encounter (Signed)
Tried to call pt and automated message states not accepting messages right now. The pt is in a SNF and confirmed with nurse today that he was getting allopurinol 300 mg a day. They should order his rx through the facility pharmacy. Will try to cb and confirm.

## 2020-05-24 ENCOUNTER — Telehealth: Payer: Self-pay | Admitting: *Deleted

## 2020-05-24 ENCOUNTER — Ambulatory Visit: Payer: Medicaid Other | Admitting: Physician Assistant

## 2020-05-24 NOTE — Telephone Encounter (Signed)
Patient called through front desk requesting assistance with transportation. He is currently staying at [facility] in [place] and states he wasn't aware of his appointment today. Call to transportation director, Jackelyn Poling, who was informed of new appointment scheduled for this Friday, 05/26/20 at 10:00 am (arrival by 9:45 am). Patient is aware and Debbie informed she would make sure he is here for his appointment.

## 2020-05-26 ENCOUNTER — Ambulatory Visit (INDEPENDENT_AMBULATORY_CARE_PROVIDER_SITE_OTHER): Payer: Medicaid Other

## 2020-05-26 ENCOUNTER — Ambulatory Visit (INDEPENDENT_AMBULATORY_CARE_PROVIDER_SITE_OTHER): Payer: Medicaid Other | Admitting: Family

## 2020-05-26 DIAGNOSIS — M25551 Pain in right hip: Secondary | ICD-10-CM

## 2020-05-31 NOTE — Progress Notes (Signed)
Office Visit Note   Patient: [name]           Date of Birth: [birth year]           MRN: [id number] Visit Date: 05/26/2020              Requested by: [name] [facility] [place],  [place] [address] PCP: [facility], [facility]  Chief Complaint  Patient presents with  . Right Foot - Pain      HPI: Pleasant 65 year old gentleman who is status post ORIF of a right intertrochanteric hip fracture. overall he is doing fairly well.  He has been maintaining his weightbearing restrictions.  He is residing at skilled nursing. Assessment & Plan: Visit Diagnoses:  1. Pain in right hip     Plan: may begin TDWbat on surgical side. Progress with PT.   Follow-Up Instructions: Return in about 4 weeks (around 06/23/2020).   Ortho Exam  Patient is alert, oriented, no adenopathy, well-dressed, normal affect, normal respiratory effort. Right hip well-healed surgical incision. some mild tenderness with internal and external rotation.  Compartments are soft and nontender distal motor function is intact.   Imaging: No results found. No images are attached to the encounter.  Labs: Lab Results  Component Value Date   HGBA1C 7.2 (H) 04/13/2020   HGBA1C 6.0 (H) 05/26/2019   HGBA1C 7.2 (H) 03/25/2019   ESRSEDRATE 81 (H) 03/25/2019   CRP 1.4 (H) 04/11/2019   CRP 1.9 (H) 03/25/2019   LABURIC 7.0 05/11/2020   LABURIC 5.6 04/13/2019   REPTSTATUS 04/16/2020 FINAL 04/14/2020   GRAMSTAIN  03/27/2019    RARE WBC PRESENT,BOTH PMN AND MONONUCLEAR NO ORGANISMS SEEN    CULT 50,000 COLONIES/mL ENTEROBACTER AEROGENES (A) 04/14/2020   LABORGA ENTEROBACTER AEROGENES (A) 04/14/2020     Lab Results  Component Value Date   ALBUMIN 2.9 (L) 04/13/2020   ALBUMIN 3.0 (L) 04/13/2020   ALBUMIN 3.0 (L) 04/12/2019   PREALBUMIN 17.6 (L) 03/25/2019    Lab Results  Component Value Date   MG 1.8 04/17/2020   MG 1.9 04/16/2020   MG 1.9 04/13/2020   No  results found for: VD25OH  Lab Results  Component Value Date   PREALBUMIN 17.6 (L) 03/25/2019   CBC EXTENDED Latest Ref Rng & Units 04/17/2020 04/16/2020 04/15/2020  WBC 4.0 - 10.5 K/uL 9.5 9.6 10.7(H)  RBC 4.22 - 5.81 MIL/uL 2.63(L) 2.64(L) 2.61(L)  HGB 13.0 - 17.0 g/dL 8.2(L) 8.3(L) 8.3(L)  HCT 39.0 - 52.0 % 24.3(L) 24.1(L) 24.2(L)  PLT 150 - 400 K/uL 311 264 267  NEUTROABS 1.7 - 7.7 K/uL - - -  LYMPHSABS 0.7 - 4.0 K/uL - - -     There is no height or weight on file to calculate BMI.  Orders:  Orders Placed This Encounter  Procedures  . XR HIP UNILAT W OR W/O PELVIS 1V RIGHT   No orders of the defined types were placed in this encounter.    Procedures: No procedures performed  Clinical Data: No additional findings.  ROS:  All other systems negative, except as noted in the HPI. Review of Systems  Constitutional: Negative for chills and fever.  Musculoskeletal: Positive for arthralgias and myalgias.    Objective: Vital Signs: There were no vitals taken for this visit.  Specialty Comments:  No specialty comments available.  PMFS History: Patient Active Problem List   Diagnosis Date Noted  . Closed intertrochanteric fracture of right hip, initial encounter ([place])   .  Fx intertrochanteric hip ([place]) 04/13/2020  . Acute lower UTI 04/13/2020  . Anxiety 04/13/2020  . Agitation 04/13/2020  . Wound dehiscence 05/26/2019  . Dehiscence of amputation stump ([place])   . Wound infection   . Acute osteomyelitis of left foot ([place]) 04/11/2019  . Lactic acidosis 04/11/2019  . Sepsis due to Enterobacter ([place]) 04/11/2019  . Diabetic foot ulcer ([place]) 03/25/2019  . Idiopathic chronic gout of left foot without tophus   . Myelopathy ([place]) 07/16/2013  . Ingrown nail 07/05/2013  . Paronychia 07/05/2013  . Diverticulosis 04/22/2013  . Acute osteomyelitis of humerus ([place]) 02/07/2013  . Osteomyelitis of left foot ([place]) 02/01/2013  . Joint infection ([place]) 01/22/2013  . Pyogenic  arthritis of shoulder region ([place]) 01/15/2013  . Nephrolithiasis 01/23/2012  . Lumbar radiculopathy 02/28/2011  . Acute gout of right knee 04/30/2010  . Chronic pain 02/14/2010  . NASH (nonalcoholic steatohepatitis) 11/08/2008  . Obesity 10/27/2008  . Hypertriglyceridemia 10/27/2007  . Uncontrolled type 2 diabetes mellitus with hyperglycemia, with long-term current use of insulin ([place]) 03/12/2006  . Essential hypertension, benign 03/12/2006   Past Medical History:  Diagnosis Date  . Arthritis   . Back pain   . Diabetes mellitus without complication ([place])   . Diabetic foot ulcer ([place]) 03/2019  . History of kidney stones   . Hypertension     Family History  Problem Relation Age of Onset  . Heart failure Mother   . Cancer Father     Past Surgical History:  Procedure Laterality Date  . BACK SURGERY    . CARPAL TUNNEL RELEASE    . CHOLECYSTECTOMY    . FOOT SURGERY    . INTRAMEDULLARY (IM) NAIL INTERTROCHANTERIC Right 04/14/2020   Procedure: INTRAMEDULLARY (IM) NAIL INTERTROCHANTRIC;  Surgeon: [name], MD;  Location: [facility];  Service: Orthopedics;  Laterality: Right;  . INTRAMEDULLARY (IM) NAIL INTERTROCHANTRIC (Right Leg Upper)  04/14/2020  . JOINT REPLACEMENT    . REVISION OF TRANSMETATARSAL AMPUTATION Right 05/26/2019  . SHOULDER ARTHROSCOPY    . STUMP REVISION Left 05/26/2019   Procedure: REVISION LEFT TRANSMETATARSAL AMPUTATION;  Surgeon: [name], MD;  Location: [facility];  Service: Orthopedics;  Laterality: Left;  . TOTAL KNEE ARTHROPLASTY    . TRANSMETATARSAL AMPUTATION Left 03/27/2019   Procedure: TRANSMETATARSAL AMPUTATION left foot;  Surgeon: [name], MD;  Location: [facility];  Service: Orthopedics;  Laterality: Left;   Social History   Occupational History  . Not on file  Tobacco Use  . Smoking status: Never Smoker  . Smokeless tobacco: Never Used  Vaping Use  . Vaping Use: Never used  Substance and Sexual Activity  . Alcohol use: No  . Drug use: No   . Sexual activity: Not on file

## 2020-06-01 ENCOUNTER — Ambulatory Visit: Payer: Medicaid Other | Admitting: Physician Assistant

## 2020-06-06 ENCOUNTER — Telehealth: Payer: Self-pay

## 2020-06-06 NOTE — Telephone Encounter (Signed)
Sharyn Lull called requesting verbal orders for pt to have Physical Therapy  2 time a week for 4 weeks  1 time a week for 5 weeks For strength mobility and safety.   Would also like OT to assist safety with ADL

## 2020-06-07 NOTE — Telephone Encounter (Signed)
Called and gave verbal ok for orders below.

## 2020-06-15 ENCOUNTER — Telehealth: Payer: Self-pay | Admitting: Orthopedic Surgery

## 2020-06-15 NOTE — Telephone Encounter (Signed)
Called and sw [name] and advised veral ok for OT orders.

## 2020-06-15 NOTE — Telephone Encounter (Signed)
Malori ( Occupational therapist) from [facility] called requesting verbal orders for pt. Malori states pt refused OT and asking for orders next week. Malori phone number is [phone].

## 2020-06-21 ENCOUNTER — Telehealth: Payer: Self-pay | Admitting: Orthopedic Surgery

## 2020-06-21 NOTE — Telephone Encounter (Signed)
Malerie with [facility] called to let [name] know the pt is refusing anymore home health OT.   Malerie CB# [phone]

## 2020-06-22 ENCOUNTER — Encounter: Payer: Self-pay | Admitting: Orthopedic Surgery

## 2020-06-22 ENCOUNTER — Other Ambulatory Visit: Payer: Self-pay

## 2020-06-22 ENCOUNTER — Ambulatory Visit (INDEPENDENT_AMBULATORY_CARE_PROVIDER_SITE_OTHER): Payer: Medicaid Other | Admitting: Orthopedic Surgery

## 2020-06-22 DIAGNOSIS — L97511 Non-pressure chronic ulcer of other part of right foot limited to breakdown of skin: Secondary | ICD-10-CM

## 2020-06-22 DIAGNOSIS — M25551 Pain in right hip: Secondary | ICD-10-CM

## 2020-06-22 NOTE — Telephone Encounter (Signed)
[name] is aware.

## 2020-06-22 NOTE — Progress Notes (Signed)
Office Visit Note   Patient: [name]           Date of Birth: 04/27/1955           MRN: [id number] Visit Date: 06/22/2020              Requested by: [name] [facility] [place],  [place] [address] PCP: [facility]  Chief Complaint  Patient presents with   Right Foot - Follow-up      HPI: Patient is a 65 year old gentleman who presents for 2 separate issues.  #1 he is status post internal fixation for right intertrochanteric hip fracture he states he still has some thigh pain.  Patient also was seen in follow-up for [facility] grade 1 ulcer right foot.  Assessment & Plan: Visit Diagnoses:  1. Non-pressure chronic ulcer of other part of right foot limited to breakdown of skin ([place])   2. Pain in right hip     Plan: Patient was given a prescription for double extra-large compression socks discussed that these should be worn daily.  Discussed the importance of elevation.  Follow-Up Instructions: Return in about 4 weeks (around 07/20/2020).   Ortho Exam  Patient is alert, oriented, no adenopathy, well-dressed, normal affect, normal respiratory effort. Examination patient has no pain with passive range of motion of the right hip examination the right foot there is no cellulitis he has dorsiflexion to neutral the plantar ulcer has healed.  The calf measures 50 cm in circumference on the left 47 cm in circumference on the right.  Imaging: No results found. No images are attached to the encounter.  Labs: Lab Results  Component Value Date   HGBA1C 7.2 (H) 04/13/2020   HGBA1C 6.0 (H) 05/26/2019   HGBA1C 7.2 (H) 03/25/2019   ESRSEDRATE 81 (H) 03/25/2019   CRP 1.4 (H) 04/11/2019   CRP 1.9 (H) 03/25/2019   LABURIC 7.0 05/11/2020   LABURIC 5.6 04/13/2019   REPTSTATUS 04/16/2020 FINAL 04/14/2020   GRAMSTAIN  03/27/2019    RARE WBC PRESENT,BOTH PMN AND MONONUCLEAR NO ORGANISMS SEEN    CULT 50,000 COLONIES/mL ENTEROBACTER  AEROGENES (A) 04/14/2020   LABORGA ENTEROBACTER AEROGENES (A) 04/14/2020     Lab Results  Component Value Date   ALBUMIN 2.9 (L) 04/13/2020   ALBUMIN 3.0 (L) 04/13/2020   ALBUMIN 3.0 (L) 04/12/2019   PREALBUMIN 17.6 (L) 03/25/2019    Lab Results  Component Value Date   MG 1.8 04/17/2020   MG 1.9 04/16/2020   MG 1.9 04/13/2020   No results found for: VD25OH  Lab Results  Component Value Date   PREALBUMIN 17.6 (L) 03/25/2019   CBC EXTENDED Latest Ref Rng & Units 04/17/2020 04/16/2020 04/15/2020  WBC 4.0 - 10.5 K/uL 9.5 9.6 10.7(H)  RBC 4.22 - 5.81 MIL/uL 2.63(L) 2.64(L) 2.61(L)  HGB 13.0 - 17.0 g/dL 8.2(L) 8.3(L) 8.3(L)  HCT 39.0 - 52.0 % 24.3(L) 24.1(L) 24.2(L)  PLT 150 - 400 K/uL 311 264 267  NEUTROABS 1.7 - 7.7 K/uL - - -  LYMPHSABS 0.7 - 4.0 K/uL - - -     There is no height or weight on file to calculate BMI.  Orders:  No orders of the defined types were placed in this encounter.  No orders of the defined types were placed in this encounter.    Procedures: No procedures performed  Clinical Data: No additional findings.  ROS:  All other systems negative, except as noted in the HPI. Review of Systems  Objective: Vital Signs: There were no vitals taken for this visit.  Specialty Comments:  No specialty comments available.  PMFS History: Patient Active Problem List   Diagnosis Date Noted   Closed intertrochanteric fracture of right hip, initial encounter [facility])    Fx intertrochanteric hip ([place]) 04/13/2020   Acute lower UTI 04/13/2020   Anxiety 04/13/2020   Agitation 04/13/2020   Wound dehiscence 05/26/2019   Dehiscence of amputation stump (HCC)    Wound infection    Acute osteomyelitis of left foot ([place]) 04/11/2019   Lactic acidosis 04/11/2019   Sepsis due to Enterobacter ([place]) 04/11/2019   Diabetic foot ulcer ([place]) 03/25/2019   Idiopathic chronic gout of left foot without tophus    Myelopathy (HCC) 07/16/2013   Ingrown nail 07/05/2013    Paronychia 07/05/2013   Diverticulosis 04/22/2013   Acute osteomyelitis of humerus ([place]) 02/07/2013   Osteomyelitis of left foot ([place]) 02/01/2013   Joint infection ([place]) 01/22/2013   Pyogenic arthritis of shoulder region ([place]) 01/15/2013   Nephrolithiasis 01/23/2012   Lumbar radiculopathy 02/28/2011   Acute gout of right knee 04/30/2010   Chronic pain 02/14/2010   NASH (nonalcoholic steatohepatitis) 11/08/2008   Obesity 10/27/2008   Hypertriglyceridemia 10/27/2007   Uncontrolled type 2 diabetes mellitus with hyperglycemia, with long-term current use of insulin ([place]) 03/12/2006   Essential hypertension, benign 03/12/2006   Past Medical History:  Diagnosis Date   Arthritis    Back pain    Diabetes mellitus without complication ([place])    Diabetic foot ulcer ([place]) 03/2019   History of kidney stones    Hypertension     Family History  Problem Relation Age of Onset   Heart failure Mother    Cancer Father     Past Surgical History:  Procedure Laterality Date   BACK SURGERY     CARPAL TUNNEL RELEASE     CHOLECYSTECTOMY     FOOT SURGERY     INTRAMEDULLARY (IM) NAIL INTERTROCHANTERIC Right 04/14/2020   Procedure: INTRAMEDULLARY (IM) NAIL INTERTROCHANTRIC;  Surgeon: [name], MD;  Location: [place];  Service: Orthopedics;  Laterality: Right;   INTRAMEDULLARY (IM) NAIL INTERTROCHANTRIC (Right Leg Upper)  04/14/2020   JOINT REPLACEMENT     REVISION OF TRANSMETATARSAL AMPUTATION Right 05/26/2019   SHOULDER ARTHROSCOPY     STUMP REVISION Left 05/26/2019   Procedure: REVISION LEFT TRANSMETATARSAL AMPUTATION;  Surgeon: [name], MD;  Location: [place];  Service: Orthopedics;  Laterality: Left;   TOTAL KNEE ARTHROPLASTY     TRANSMETATARSAL AMPUTATION Left 03/27/2019   Procedure: TRANSMETATARSAL AMPUTATION left foot;  Surgeon: [name], MD;  Location: [place];  Service: Orthopedics;  Laterality: Left;   Social History   Occupational History   Not on file  Tobacco Use    Smoking status: Never   Smokeless tobacco: Never  Vaping Use   Vaping Use: Never used  Substance and Sexual Activity   Alcohol use: No   Drug use: No   Sexual activity: Not on file

## 2020-06-26 ENCOUNTER — Telehealth: Payer: Self-pay | Admitting: Orthopedic Surgery

## 2020-06-26 NOTE — Telephone Encounter (Signed)
Called and lm on vm to advise that this would be fine to call with any questions.

## 2020-06-26 NOTE — Telephone Encounter (Signed)
[name] with [facility] called stating the pt is refusing any additional PT and she would like permission to discharge the pt upon his request.   Sharyn Lull CB# [phone]

## 2020-07-19 ENCOUNTER — Ambulatory Visit: Payer: Medicaid Other | Admitting: Family

## 2020-07-26 ENCOUNTER — Ambulatory Visit (INDEPENDENT_AMBULATORY_CARE_PROVIDER_SITE_OTHER): Payer: Medicaid Other | Admitting: Family

## 2020-07-26 ENCOUNTER — Ambulatory Visit (INDEPENDENT_AMBULATORY_CARE_PROVIDER_SITE_OTHER): Payer: Medicaid Other

## 2020-07-26 ENCOUNTER — Other Ambulatory Visit: Payer: Self-pay

## 2020-07-26 ENCOUNTER — Ambulatory Visit: Payer: Self-pay

## 2020-07-26 ENCOUNTER — Encounter: Payer: Self-pay | Admitting: Family

## 2020-07-26 VITALS — Ht 75.0 in | Wt 220.0 lb

## 2020-07-26 DIAGNOSIS — M5416 Radiculopathy, lumbar region: Secondary | ICD-10-CM

## 2020-07-26 DIAGNOSIS — M25551 Pain in right hip: Secondary | ICD-10-CM
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
## 2020-08-11 ENCOUNTER — Ambulatory Visit: Payer: Medicaid Other | Admitting: Orthopaedic Surgery

## 2020-11-08 ENCOUNTER — Ambulatory Visit: Payer: Medicaid Other | Admitting: Family

## 2020-11-13 ENCOUNTER — Other Ambulatory Visit: Payer: Self-pay

## 2020-11-13 ENCOUNTER — Other Ambulatory Visit: Payer: Self-pay | Admitting: Orthopedic Surgery

## 2020-11-13 ENCOUNTER — Ambulatory Visit: Payer: Self-pay

## 2020-11-13 ENCOUNTER — Encounter: Payer: Self-pay | Admitting: Orthopedic Surgery

## 2020-11-13 ENCOUNTER — Ambulatory Visit (INDEPENDENT_AMBULATORY_CARE_PROVIDER_SITE_OTHER): Payer: Medicaid Other | Admitting: Orthopedic Surgery

## 2020-11-13 DIAGNOSIS — M25551 Pain in right hip: Secondary | ICD-10-CM

## 2020-11-13 DIAGNOSIS — M1A09X0 Idiopathic chronic gout, multiple sites, without tophus (tophi): Secondary | ICD-10-CM

## 2020-11-13 NOTE — Progress Notes (Signed)
Office Visit Note   Patient: [name]           Date of Birth: 28-Nov-1955           MRN: [id number] Visit Date: 11/13/2020              Requested by: [name] [facility] [place],  [place] [address] PCP: [facility], [facility]  Chief Complaint  Patient presents with   Right Hip - Pain, Follow-up      HPI: Patient is a 65 year old gentleman who presents in follow-up for persistent right-sided radicular pain and right thigh pain which he describes as hip pain.  Patient is status post intramedullary nailing for an intertrochanteric hip fracture on the right 7 months ago.  Patient states he is seen a spine specialist and declined offer for an epidural steroid injection.  Patient states he is currently taking oxycodone 15 mg tablets 4 times a day.  Patient states he has swelling over the [facility] joint of the right shoulder.  Assessment & Plan: Visit Diagnoses:  1. Pain in right hip     Plan: We will draw a uric acid level and see if gout is a part of his symptoms.  We will request an MRI scan lumbar spine to see further disc pathology involving the right lower extremity.  Follow-Up Instructions: Return if symptoms worsen or fail to improve.   Ortho Exam  Patient is alert, oriented, no adenopathy, well-dressed, normal affect, normal respiratory effort. Patient has a history of gout but states he has had no gout flares and is not taking his gout medicine.  He states he has right-sided thigh pain radiating to the right calf.  He has venous stasis swelling of both lower extremities but no venous ulcers.  He has swelling over the [facility] joint on the right but there is no redness or cellulitis.   There is no pain with internal and external rotation of the right hip he has a negative straight leg raise with no focal motor weakness.  Imaging: XR HIP UNILAT W OR W/O PELVIS 2-3 VIEWS RIGHT  Result Date: 11/13/2020 2 view radiographs of the right hip  shows stable internal fixation for an intertrochanteric hip fracture.  There is no hardware failure or loosening.  There is improved bony formation throughout the fracture site.  No images are attached to the encounter.  Labs: Lab Results  Component Value Date   HGBA1C 7.2 (H) 04/13/2020   HGBA1C 6.0 (H) 05/26/2019   HGBA1C 7.2 (H) 03/25/2019   ESRSEDRATE 81 (H) 03/25/2019   CRP 1.4 (H) 04/11/2019   CRP 1.9 (H) 03/25/2019   LABURIC 7.0 05/11/2020   LABURIC 5.6 04/13/2019   REPTSTATUS 04/16/2020 FINAL 04/14/2020   GRAMSTAIN  03/27/2019    RARE WBC PRESENT,BOTH PMN AND MONONUCLEAR NO ORGANISMS SEEN    CULT 50,000 COLONIES/mL ENTEROBACTER AEROGENES (A) 04/14/2020   LABORGA ENTEROBACTER AEROGENES (A) 04/14/2020     Lab Results  Component Value Date   ALBUMIN 2.9 (L) 04/13/2020   ALBUMIN 3.0 (L) 04/13/2020   ALBUMIN 3.0 (L) 04/12/2019   PREALBUMIN 17.6 (L) 03/25/2019    Lab Results  Component Value Date   MG 1.8 04/17/2020   MG 1.9 04/16/2020   MG 1.9 04/13/2020   No results found for: VD25OH  Lab Results  Component Value Date   PREALBUMIN 17.6 (L) 03/25/2019   CBC EXTENDED Latest Ref Rng & Units 04/17/2020 04/16/2020 04/15/2020  WBC 4.0 - 10.5  K/uL 9.5 9.6 10.7(H)  RBC 4.22 - 5.81 MIL/uL 2.63(L) 2.64(L) 2.61(L)  HGB 13.0 - 17.0 g/dL 8.2(L) 8.3(L) 8.3(L)  HCT 39.0 - 52.0 % 24.3(L) 24.1(L) 24.2(L)  PLT 150 - 400 K/uL 311 264 267  NEUTROABS 1.7 - 7.7 K/uL - - -  LYMPHSABS 0.7 - 4.0 K/uL - - -     There is no height or weight on file to calculate BMI.  Orders:  Orders Placed This Encounter  Procedures   XR HIP UNILAT W OR W/O PELVIS 2-3 VIEWS RIGHT   Uric acid   No orders of the defined types were placed in this encounter.    Procedures: No procedures performed  Clinical Data: No additional findings.  ROS:  All other systems negative, except as noted in the HPI. Review of Systems  Objective: Vital Signs: There were no vitals taken for this  visit.  Specialty Comments:  No specialty comments available.  PMFS History: Patient Active Problem List   Diagnosis Date Noted   Closed intertrochanteric fracture of right hip, initial encounter [facility])    Fx intertrochanteric hip ([place]) 04/13/2020   Acute lower UTI 04/13/2020   Anxiety 04/13/2020   Agitation 04/13/2020   Wound dehiscence 05/26/2019   Dehiscence of amputation stump (HCC)    Wound infection    Acute osteomyelitis of left foot ([place]) 04/11/2019   Lactic acidosis 04/11/2019   Sepsis due to Enterobacter ([place]) 04/11/2019   Diabetic foot ulcer ([place]) 03/25/2019   Idiopathic chronic gout of left foot without tophus    Myelopathy (HCC) 07/16/2013   Ingrown nail 07/05/2013   Paronychia 07/05/2013   Diverticulosis 04/22/2013   Acute osteomyelitis of humerus ([place]) 02/07/2013   Osteomyelitis of left foot ([place]) 02/01/2013   Joint infection ([place]) 01/22/2013   Pyogenic arthritis of shoulder region ([place]) 01/15/2013   Nephrolithiasis 01/23/2012   Lumbar radiculopathy 02/28/2011   Acute gout of right knee 04/30/2010   Chronic pain 02/14/2010   NASH (nonalcoholic steatohepatitis) 11/08/2008   Obesity 10/27/2008   Hypertriglyceridemia 10/27/2007   Uncontrolled type 2 diabetes mellitus with hyperglycemia, with long-term current use of insulin ([place]) 03/12/2006   Essential hypertension, benign 03/12/2006   Past Medical History:  Diagnosis Date   Arthritis    Back pain    Diabetes mellitus without complication ([place])    Diabetic foot ulcer ([place]) 03/2019   History of kidney stones    Hypertension     Family History  Problem Relation Age of Onset   Heart failure Mother    Cancer Father     Past Surgical History:  Procedure Laterality Date   BACK SURGERY     CARPAL TUNNEL RELEASE     CHOLECYSTECTOMY     FOOT SURGERY     INTRAMEDULLARY (IM) NAIL INTERTROCHANTERIC Right 04/14/2020   Procedure: INTRAMEDULLARY (IM) NAIL INTERTROCHANTRIC;  Surgeon: [name], MD;  Location:  [place];  Service: Orthopedics;  Laterality: Right;   INTRAMEDULLARY (IM) NAIL INTERTROCHANTRIC (Right Leg Upper)  04/14/2020   JOINT REPLACEMENT     REVISION OF TRANSMETATARSAL AMPUTATION Right 05/26/2019   SHOULDER ARTHROSCOPY     STUMP REVISION Left 05/26/2019   Procedure: REVISION LEFT TRANSMETATARSAL AMPUTATION;  Surgeon: [name], MD;  Location: [place];  Service: Orthopedics;  Laterality: Left;   TOTAL KNEE ARTHROPLASTY     TRANSMETATARSAL AMPUTATION Left 03/27/2019   Procedure: TRANSMETATARSAL AMPUTATION left foot;  Surgeon: [name], MD;  Location: [place];  Service: Orthopedics;  Laterality: Left;  Social History   Occupational History   Not on file  Tobacco Use   Smoking status: Never   Smokeless tobacco: Never  Vaping Use   Vaping Use: Never used  Substance and Sexual Activity   Alcohol use: No   Drug use: No   Sexual activity: Not on file

## 2020-11-14 ENCOUNTER — Other Ambulatory Visit: Payer: Self-pay

## 2020-11-14 ENCOUNTER — Telehealth: Payer: Self-pay

## 2020-11-14 LAB — URIC ACID: Uric Acid, Serum: 8.6 mg/dL — ABNORMAL HIGH (ref 4.0–8.0)

## 2020-11-14 MED ORDER — ALLOPURINOL 100 MG PO TABS: 100.0000 mg | ORAL_TABLET | Freq: Two times a day (BID) | ORAL | 1 refills | Status: DC

## 2020-11-14 NOTE — Telephone Encounter (Signed)
Called pt to advise of message below. Voiced understanding and will call with questions.

## 2020-11-14 NOTE — Telephone Encounter (Signed)
-----   Message from [name], MD sent at 11/14/2020  8:41 AM EDT ----- Call, his uric acid is 8.6, needs to be on allopurinol 181m bid, call in rx, f/u 4 weeks ----- Message ----- From: ICheyenne AdasLab Results In Sent: 11/14/2020   2:28 AM EDT To: MNewt Minion MD

## 2020-12-04 ENCOUNTER — Ambulatory Visit (HOSPITAL_COMMUNITY): Payer: Medicaid Other | Attending: Orthopedic Surgery

## 2020-12-10 ENCOUNTER — Other Ambulatory Visit: Payer: Self-pay | Admitting: Orthopedic Surgery

## 2020-12-11 ENCOUNTER — Ambulatory Visit: Payer: Medicaid Other | Admitting: Orthopedic Surgery

## 2020-12-28 ENCOUNTER — Other Ambulatory Visit: Payer: Self-pay | Admitting: Orthopedic Surgery

## 2020-12-28 ENCOUNTER — Encounter (HOSPITAL_COMMUNITY): Payer: Self-pay | Admitting: *Deleted

## 2020-12-28 ENCOUNTER — Emergency Department (HOSPITAL_COMMUNITY): Payer: Medicaid Other

## 2020-12-28 ENCOUNTER — Inpatient Hospital Stay (HOSPITAL_COMMUNITY)
Admission: EM | Admit: 2020-12-28 | Discharge: 2021-01-05 | DRG: 240 | Disposition: A | Discharge status: Home / Self Care | Payer: Medicaid Other | Attending: Student | Admitting: Student

## 2020-12-28 ENCOUNTER — Inpatient Hospital Stay (HOSPITAL_COMMUNITY): Payer: Medicaid Other

## 2020-12-28 DIAGNOSIS — E11628 Type 2 diabetes mellitus with other skin complications: Secondary | ICD-10-CM | POA: Diagnosis not present

## 2020-12-28 DIAGNOSIS — Z87440 Personal history of urinary (tract) infections: Secondary | ICD-10-CM

## 2020-12-28 DIAGNOSIS — E871 Hypo-osmolality and hyponatremia: Secondary | ICD-10-CM | POA: Diagnosis not present

## 2020-12-28 DIAGNOSIS — E1140 Type 2 diabetes mellitus with diabetic neuropathy, unspecified: Secondary | ICD-10-CM | POA: Diagnosis not present

## 2020-12-28 DIAGNOSIS — E872 Acidosis, unspecified: Secondary | ICD-10-CM | POA: Diagnosis present

## 2020-12-28 DIAGNOSIS — R600 Localized edema: Secondary | ICD-10-CM | POA: Diagnosis not present

## 2020-12-28 DIAGNOSIS — M7989 Other specified soft tissue disorders: Secondary | ICD-10-CM | POA: Diagnosis not present

## 2020-12-28 DIAGNOSIS — Z7984 Long term (current) use of oral hypoglycemic drugs: Secondary | ICD-10-CM | POA: Diagnosis not present

## 2020-12-28 DIAGNOSIS — L03115 Cellulitis of right lower limb: Secondary | ICD-10-CM | POA: Diagnosis not present

## 2020-12-28 DIAGNOSIS — M1990 Unspecified osteoarthritis, unspecified site: Secondary | ICD-10-CM | POA: Diagnosis present

## 2020-12-28 DIAGNOSIS — E669 Obesity, unspecified: Secondary | ICD-10-CM | POA: Diagnosis present

## 2020-12-28 DIAGNOSIS — L97515 Non-pressure chronic ulcer of other part of right foot with muscle involvement without evidence of necrosis: Secondary | ICD-10-CM | POA: Diagnosis not present

## 2020-12-28 DIAGNOSIS — Z888 Allergy status to other drugs, medicaments and biological substances status: Secondary | ICD-10-CM

## 2020-12-28 DIAGNOSIS — K7581 Nonalcoholic steatohepatitis (NASH): Secondary | ICD-10-CM | POA: Diagnosis present

## 2020-12-28 DIAGNOSIS — E1152 Type 2 diabetes mellitus with diabetic peripheral angiopathy with gangrene: Secondary | ICD-10-CM | POA: Diagnosis not present

## 2020-12-28 DIAGNOSIS — K7460 Unspecified cirrhosis of liver: Secondary | ICD-10-CM | POA: Diagnosis present

## 2020-12-28 DIAGNOSIS — Z8249 Family history of ischemic heart disease and other diseases of the circulatory system: Secondary | ICD-10-CM

## 2020-12-28 DIAGNOSIS — Z7982 Long term (current) use of aspirin: Secondary | ICD-10-CM

## 2020-12-28 DIAGNOSIS — S90821A Blister (nonthermal), right foot, initial encounter: Secondary | ICD-10-CM | POA: Diagnosis not present

## 2020-12-28 DIAGNOSIS — M19071 Primary osteoarthritis, right ankle and foot: Secondary | ICD-10-CM | POA: Diagnosis not present

## 2020-12-28 DIAGNOSIS — Z87442 Personal history of urinary calculi: Secondary | ICD-10-CM

## 2020-12-28 DIAGNOSIS — Z89422 Acquired absence of other left toe(s): Secondary | ICD-10-CM | POA: Diagnosis not present

## 2020-12-28 DIAGNOSIS — L97511 Non-pressure chronic ulcer of other part of right foot limited to breakdown of skin: Secondary | ICD-10-CM | POA: Diagnosis not present

## 2020-12-28 DIAGNOSIS — Z794 Long term (current) use of insulin: Secondary | ICD-10-CM

## 2020-12-28 DIAGNOSIS — E11621 Type 2 diabetes mellitus with foot ulcer: Secondary | ICD-10-CM | POA: Diagnosis not present

## 2020-12-28 DIAGNOSIS — L97519 Non-pressure chronic ulcer of other part of right foot with unspecified severity: Secondary | ICD-10-CM | POA: Diagnosis not present

## 2020-12-28 DIAGNOSIS — M109 Gout, unspecified: Secondary | ICD-10-CM | POA: Diagnosis present

## 2020-12-28 DIAGNOSIS — I10 Essential (primary) hypertension: Secondary | ICD-10-CM | POA: Diagnosis present

## 2020-12-28 DIAGNOSIS — E876 Hypokalemia: Secondary | ICD-10-CM | POA: Diagnosis not present

## 2020-12-28 DIAGNOSIS — D696 Thrombocytopenia, unspecified: Secondary | ICD-10-CM | POA: Diagnosis not present

## 2020-12-28 DIAGNOSIS — E08621 Diabetes mellitus due to underlying condition with foot ulcer: Secondary | ICD-10-CM

## 2020-12-28 DIAGNOSIS — D649 Anemia, unspecified: Secondary | ICD-10-CM | POA: Diagnosis not present

## 2020-12-28 DIAGNOSIS — Z79899 Other long term (current) drug therapy: Secondary | ICD-10-CM | POA: Diagnosis not present

## 2020-12-28 DIAGNOSIS — Z6828 Body mass index (BMI) 28.0-28.9, adult: Secondary | ICD-10-CM

## 2020-12-28 DIAGNOSIS — Z89432 Acquired absence of left foot: Secondary | ICD-10-CM

## 2020-12-28 DIAGNOSIS — E1165 Type 2 diabetes mellitus with hyperglycemia: Secondary | ICD-10-CM | POA: Diagnosis present

## 2020-12-28 DIAGNOSIS — Z20822 Contact with and (suspected) exposure to covid-19: Secondary | ICD-10-CM | POA: Diagnosis not present

## 2020-12-28 LAB — COMPREHENSIVE METABOLIC PANEL
ALT: 18 U/L (ref 0–44)
AST: 17 U/L (ref 15–41)
Albumin: 4.1 g/dL (ref 3.5–5.0)
Alkaline Phosphatase: 113 U/L (ref 38–126)
Anion gap: 10 (ref 5–15)
BUN: 22 mg/dL (ref 8–23)
CO2: 25 mmol/L (ref 22–32)
Calcium: 8.8 mg/dL — ABNORMAL LOW (ref 8.9–10.3)
Chloride: 94 mmol/L — ABNORMAL LOW (ref 98–111)
Creatinine, Ser: 1.2 mg/dL (ref 0.61–1.24)
GFR, Estimated: >60 mL/min (ref >60)
Glucose, Bld: 408 mg/dL — ABNORMAL HIGH (ref 70–99)
Potassium: 4.3 mmol/L (ref 3.5–5.1)
Sodium: 129 mmol/L — ABNORMAL LOW (ref 135–145)
Total Bilirubin: 1.1 mg/dL (ref 0.3–1.2)
Total Protein: 8 g/dL (ref 6.5–8.1)

## 2020-12-28 LAB — CBC
HCT: 37.6 % — ABNORMAL LOW (ref 39.0–52.0)
Hemoglobin: 13.3 g/dL (ref 13.0–17.0)
MCH: 33.3 pg (ref 26.0–34.0)
MCHC: 35.4 g/dL (ref 30.0–36.0)
MCV: 94 fL (ref 80.0–100.0)
Platelets: 214 10*3/uL (ref 150–400)
RBC: 4 MIL/uL — ABNORMAL LOW (ref 4.22–5.81)
RDW: 13.7 % (ref 11.5–15.5)
WBC: 10.4 10*3/uL (ref 4.0–10.5)
nRBC: 0 % (ref 0.0–0.2)

## 2020-12-28 LAB — CBG MONITORING, ED: Glucose-Capillary: 297 mg/dL — ABNORMAL HIGH (ref 70–99)

## 2020-12-28 LAB — SEDIMENTATION RATE: Sed Rate: 44 mm/hr — ABNORMAL HIGH (ref 0–16)

## 2020-12-28 LAB — C-REACTIVE PROTEIN: CRP: 3.7 mg/dL — ABNORMAL HIGH (ref <1.0)

## 2020-12-28 LAB — LACTIC ACID, PLASMA
Lactic Acid, Venous: 2.5 mmol/L (ref 0.5–1.9)
Lactic Acid, Venous: 1.4 mmol/L (ref 0.5–1.9)

## 2020-12-28 LAB — BRAIN NATRIURETIC PEPTIDE: B Natriuretic Peptide: 17 pg/mL (ref 0.0–100.0)

## 2020-12-28 LAB — RESP PANEL BY RT-PCR (FLU A&B, COVID) ARPGX2
Influenza A by PCR: NEGATIVE
Influenza B by PCR: NEGATIVE
SARS Coronavirus 2 by RT PCR: NEGATIVE

## 2020-12-28 MED ORDER — VANCOMYCIN HCL 1500 MG/300ML IV SOLN
1500.0000 mg | Freq: Two times a day (BID) | INTRAVENOUS | Status: DC
  Administered 2020-12-29: 1500 mg via INTRAVENOUS
  Filled 2020-12-28: qty 300

## 2020-12-28 MED ORDER — VANCOMYCIN HCL IN DEXTROSE 1-5 GM/200ML-% IV SOLN
1000.0000 mg | Freq: Once | INTRAVENOUS | Status: AC
  Administered 2020-12-28: 1000 mg via INTRAVENOUS
  Filled 2020-12-28: qty 200

## 2020-12-28 MED ORDER — SODIUM CHLORIDE 0.9 % IV SOLN: INTRAVENOUS | Status: DC

## 2020-12-28 MED ORDER — IOHEXOL 300 MG/ML  SOLN
75.0000 mL | Freq: Once | INTRAMUSCULAR | Status: AC | PRN
  Administered 2020-12-28: 75 mL via INTRAVENOUS

## 2020-12-28 MED ORDER — INSULIN ASPART 100 UNIT/ML IJ SOLN
0.0000 [IU] | INTRAMUSCULAR | Status: DC
  Administered 2020-12-28: 8 [IU] via SUBCUTANEOUS
  Administered 2020-12-29: 09:00:00 3 [IU] via SUBCUTANEOUS
  Administered 2020-12-29: 20:00:00 5 [IU] via SUBCUTANEOUS
  Administered 2020-12-29: 8 [IU] via SUBCUTANEOUS
  Administered 2020-12-29: 17:00:00 11 [IU] via SUBCUTANEOUS
  Administered 2020-12-29 – 2020-12-30 (×3): 5 [IU] via SUBCUTANEOUS
  Administered 2020-12-30: 2 [IU] via SUBCUTANEOUS
  Administered 2020-12-30: 3 [IU] via SUBCUTANEOUS
  Administered 2020-12-30: 21:00:00 8 [IU] via SUBCUTANEOUS
  Administered 2020-12-30: 3 [IU] via SUBCUTANEOUS
  Administered 2020-12-31 (×2): 5 [IU] via SUBCUTANEOUS
  Administered 2020-12-31: 3 [IU] via SUBCUTANEOUS
  Administered 2020-12-31 (×2): 5 [IU] via SUBCUTANEOUS
  Administered 2020-12-31: 10:00:00 8 [IU] via SUBCUTANEOUS
  Administered 2020-12-31 – 2021-01-01 (×3): 5 [IU] via SUBCUTANEOUS
  Administered 2021-01-01: 08:00:00 3 [IU] via SUBCUTANEOUS
  Administered 2021-01-01: 18:00:00 5 [IU] via SUBCUTANEOUS
  Administered 2021-01-01: 05:00:00 3 [IU] via SUBCUTANEOUS
  Administered 2021-01-02 (×2): 5 [IU] via SUBCUTANEOUS
  Administered 2021-01-02: 05:00:00 3 [IU] via SUBCUTANEOUS
  Administered 2021-01-02 – 2021-01-03 (×4): 5 [IU] via SUBCUTANEOUS
  Administered 2021-01-03: 20:00:00 15 [IU] via SUBCUTANEOUS
  Administered 2021-01-03: 01:00:00 3 [IU] via SUBCUTANEOUS
  Administered 2021-01-03: 04:00:00 2 [IU] via SUBCUTANEOUS
  Administered 2021-01-03: 17:00:00 11 [IU] via SUBCUTANEOUS
  Administered 2021-01-04: 16:00:00 8 [IU] via SUBCUTANEOUS
  Administered 2021-01-04 (×2): 5 [IU] via SUBCUTANEOUS
  Administered 2021-01-04: 01:00:00 15 [IU] via SUBCUTANEOUS
  Administered 2021-01-04: 05:00:00 3 [IU] via SUBCUTANEOUS
  Administered 2021-01-04: 14:00:00 8 [IU] via SUBCUTANEOUS
  Administered 2021-01-05 (×4): 3 [IU] via SUBCUTANEOUS
  Filled 2020-12-28: qty 1

## 2020-12-28 MED ORDER — LACTATED RINGERS IV BOLUS (SEPSIS)
1000.0000 mL | Freq: Once | INTRAVENOUS | Status: AC
  Administered 2020-12-28: 1000 mL via INTRAVENOUS

## 2020-12-28 MED ORDER — SODIUM CHLORIDE 0.9 % IV SOLN
2.0000 g | Freq: Once | INTRAVENOUS | Status: AC
  Administered 2020-12-28: 2 g via INTRAVENOUS
  Filled 2020-12-28: qty 20

## 2020-12-28 MED ORDER — METRONIDAZOLE 500 MG/100ML IV SOLN
500.0000 mg | Freq: Two times a day (BID) | INTRAVENOUS | Status: DC
  Administered 2020-12-28 – 2021-01-04 (×15): 500 mg via INTRAVENOUS
  Filled 2020-12-28 (×16): qty 100

## 2020-12-28 MED ORDER — SODIUM CHLORIDE 0.9 % IV SOLN
2.0000 g | INTRAVENOUS | Status: AC
  Administered 2020-12-29 – 2021-01-04 (×7): 2 g via INTRAVENOUS
  Filled 2020-12-28 (×7): qty 20

## 2020-12-28 NOTE — ED Provider Notes (Signed)
Emergency Medicine Provider Triage Evaluation Note  [name] , a 65 y.o. male  was evaluated in triage.  Pt complains of Foot pain. He has a past medical history of diabetes, hypertension, chronic peripheral edema who presents emergency department chief complaint of blisters of the right foot.  Patient states that he was out partying with some friends last night and when he came home his foot was aching and throbbing, he took his shoe off and noticed that there were blisters all over his foot.  He is never had anything like this before.  Both of his legs are swollen which is his baseline.  He denies fevers or chills.  Review of Systems  Positive: Foot pain   Negative: fever  Physical Exam  BP (!) 147/81    Pulse 79    Temp 98 F (36.7 C) (Oral)    Resp 18    SpO2 95%  Gen:   Awake, no distress   Resp:  Normal effort  MSK:   Moves extremities without difficulty  Other:  BL peripheral edema  Medical Decision Making  Medically screening exam initiated at 6:09 PM.  Appropriate orders placed.  [name] was informed that the remainder of the evaluation will be completed by another provider, this initial triage assessment does not replace that evaluation, and the importance of remaining in the ED until their evaluation is complete.  Labs and imaging ordered.   Darrell Mail, PA-C 12/28/20 1811    [name], MD 12/29/20 2221

## 2020-12-28 NOTE — Progress Notes (Signed)
Pharmacy Antibiotic Note  [name] is a 65 y.o. male admitted on 12/28/2020 with cellulitis.  Pharmacy has been consulted for vanc dosing.  Pt admitted for cellulitis of right foot. Empiric vanc/ceftriaxone/flagyl ordered.  Scr 1.2  Plan: Vanc 2g IV x1 then 1.5g IV q12>>AUC 531, scr 1.2  Height: 6' 2"  (188 cm) Weight: 100 kg (220 lb 7.4 oz) IBW/kg (Calculated) : 82.2  Temp (24hrs), Avg:98.5 F (36.9 C), Min:98 F (36.7 C), Max:99.6 F (37.6 C)  Recent Labs  Lab 12/28/20 1727 12/28/20 2033  WBC 10.4  --   CREATININE 1.20  --   LATICACIDVEN 2.5* 1.4    Estimated Creatinine Clearance: 79.6 mL/min (by C-G formula based on SCr of 1.2 mg/dL).    Allergies  Allergen Reactions   Other Hives    Anti-snake venim for copperhead venom.   Pravastatin Other (See Comments)    Leg cramps, elevated CK   Prednisone Other (See Comments)    Patient states "it runs my blood sugar up so I don't take it."   Toradol [Ketorolac Tromethamine] Hives and Nausea And Vomiting    Pill form only    Antimicrobials this admission: 12/15 vanc>> 12/15 ceftriaxone>> 12/15 flagyl>>  Dose adjustments this admission:   Microbiology results: 12/15 blood>>  Onnie Boer, PharmD, [place], AAHIVP, CPP Infectious Disease Pharmacist 12/28/2020 11:56 PM

## 2020-12-28 NOTE — ED Triage Notes (Signed)
States he has blisters on right foot onset last night, both lower legs swollen

## 2020-12-28 NOTE — ED Provider Notes (Signed)
[facility] Provider Note   CSN: [id number] Arrival date & time: 12/28/20  1541     History No chief complaint on file.   [name] is a 65 y.o. male.  HPI 65 year old male presents with right foot swelling and blisters.  He states this started yesterday.  He states this happens on and off every few months.  He denies a fever.  His friend told him to come to the ER.  He tells me there was no pain but he told the screening PA that there was aching and throbbing.  No fevers.  Past Medical History:  Diagnosis Date   Arthritis    Back pain    Diabetes mellitus without complication ([place])    Diabetic foot ulcer ([place]) 03/2019   History of kidney stones    Hypertension     Patient Active Problem List   Diagnosis Date Noted   Cellulitis of right foot 12/28/2020   Closed intertrochanteric fracture of right hip, initial encounter [facility])    Fx intertrochanteric hip ([place]) 04/13/2020   Acute lower UTI 04/13/2020   Anxiety 04/13/2020   Agitation 04/13/2020   Wound dehiscence 05/26/2019   Dehiscence of amputation stump (HCC)    Wound infection    Acute osteomyelitis of left foot ([place]) 04/11/2019   Lactic acidosis 04/11/2019   Sepsis due to Enterobacter ([place]) 04/11/2019   Diabetic foot ulcer ([place]) 03/25/2019   Idiopathic chronic gout of left foot without tophus    Myelopathy (HCC) 07/16/2013   Ingrown nail 07/05/2013   Paronychia 07/05/2013   Diverticulosis 04/22/2013   Acute osteomyelitis of humerus ([place]) 02/07/2013   Osteomyelitis of left foot ([place]) 02/01/2013   Joint infection ([place]) 01/22/2013   Pyogenic arthritis of shoulder region ([place]) 01/15/2013   Nephrolithiasis 01/23/2012   Lumbar radiculopathy 02/28/2011   Acute gout of right knee 04/30/2010   Chronic pain 02/14/2010   NASH (nonalcoholic steatohepatitis) 11/08/2008   Obesity 10/27/2008   Hypertriglyceridemia 10/27/2007   Uncontrolled type 2 diabetes mellitus with hyperglycemia, with  long-term current use of insulin ([place]) 03/12/2006   Essential hypertension, benign 03/12/2006    Past Surgical History:  Procedure Laterality Date   BACK SURGERY     CARPAL TUNNEL RELEASE     CHOLECYSTECTOMY     FOOT SURGERY     INTRAMEDULLARY (IM) NAIL INTERTROCHANTERIC Right 04/14/2020   Procedure: INTRAMEDULLARY (IM) NAIL INTERTROCHANTRIC;  Surgeon: [name], MD;  Location: [facility];  Service: Orthopedics;  Laterality: Right;   INTRAMEDULLARY (IM) NAIL INTERTROCHANTRIC (Right Leg Upper)  04/14/2020   JOINT REPLACEMENT     REVISION OF TRANSMETATARSAL AMPUTATION Right 05/26/2019   SHOULDER ARTHROSCOPY     STUMP REVISION Left 05/26/2019   Procedure: REVISION LEFT TRANSMETATARSAL AMPUTATION;  Surgeon: [name], MD;  Location: [facility];  Service: Orthopedics;  Laterality: Left;   TOTAL KNEE ARTHROPLASTY     TRANSMETATARSAL AMPUTATION Left 03/27/2019   Procedure: TRANSMETATARSAL AMPUTATION left foot;  Surgeon: [name], MD;  Location: [facility];  Service: Orthopedics;  Laterality: Left;       Family History  Problem Relation Age of Onset   Heart failure Mother    Cancer Father     Social History   Tobacco Use   Smoking status: Never   Smokeless tobacco: Never  Vaping Use   Vaping Use: Never used  Substance Use Topics   Alcohol use: No   Drug use: No    Home Medications Prior to Admission medications  Medication Sig Start Date End Date Taking? Authorizing Provider  acetaminophen (TYLENOL) 325 MG tablet Take 2 tablets (650 mg total) by mouth every 4 (four) hours as needed. 04/14/20 04/14/21  Persons, Bevely Palmer, PA  allopurinol (ZYLOPRIM) 100 MG tablet TAKE 1 TABLET BY MOUTH TWICE A DAY 12/28/20   [name], MD  allopurinol (ZYLOPRIM) 300 MG tablet Take 300 mg by mouth daily. 02/19/19   [provider]  amitriptyline (ELAVIL) 25 MG tablet Take 25 mg by mouth at bedtime. 03/11/19   [provider]  amLODipine (NORVASC) 10 MG tablet Take 10 mg by mouth  daily. 02/08/16   [provider]  aspirin EC 325 MG tablet Take 1 tablet (325 mg total) by mouth daily. 04/14/20   Persons, Bevely Palmer, PA  cyclobenzaprine (FLEXERIL) 10 MG tablet SMARTSIG:1 Tablet(s) By Mouth Every 12 Hours 03/30/20   [provider]  ferrous sulfate 325 (65 FE) MG tablet Take 1 tablet (325 mg total) by mouth daily. 04/18/20 04/18/21  Shelly Coss, MD  folic acid (FOLVITE) 1 MG tablet Take 1 tablet (1 mg total) by mouth daily. 04/18/20   Florencia Reasons, MD  furosemide (LASIX) 20 MG tablet Take 20 mg by mouth daily. 01/06/19   [provider]  gabapentin (NEURONTIN) 300 MG capsule Take 900 mg by mouth daily. 03/16/19   [provider]  glipiZIDE (GLUCOTROL XL) 2.5 MG 24 hr tablet Take 2.5 mg by mouth every morning. 03/30/20   [provider]  LEVEMIR FLEXTOUCH 100 UNIT/ML Pen Inject 70 Units into the skin 2 (two) times daily.  10/08/13   [provider]  lisinopril (PRINIVIL,ZESTRIL) 40 MG tablet Take 40 mg by mouth daily. 03/05/16   [provider]  metoprolol succinate (TOPROL-XL) 100 MG 24 hr tablet Take 100 mg by mouth daily. 02/04/19   [provider]  MITIGARE 0.6 MG CAPS Take 2 capsules by mouth daily. 05/10/19   [provider]  Multiple Vitamin (MULTIVITAMIN WITH MINERALS) TABS tablet Take 1 tablet by mouth daily. 04/18/20   Florencia Reasons, MD  nitroGLYCERIN (NITRODUR - DOSED IN MG/24 HR) 0.2 mg/hr patch PLACE 1 PATCH (0.2 MG TOTAL) ONTO THE SKIN DAILY. APPLY NEW PATCH REMOVE OLD PATCH DAILY ON FOOT . PLACE IN DIFFERENT PLACE ON TOP OF FOOT 10/11/19   Persons, Bevely Palmer, PA  oxyCODONE 10 MG TABS Take 1 tablet (10 mg total) by mouth every 6 (six) hours as needed for severe pain or moderate pain. 04/20/20   Shelly Coss, MD  pentoxifylline (TRENTAL) 400 MG CR tablet Take 1 tablet (400 mg total) by mouth 3 (three) times daily with meals. 05/13/19   Persons, Bevely Palmer, PA  polyethylene glycol (MIRALAX / GLYCOLAX) 17 g packet  Take 17 g by mouth daily as needed for mild constipation. 04/17/20   Florencia Reasons, MD  thiamine 100 MG tablet Take 1 tablet (100 mg total) by mouth daily. 04/18/20   Florencia Reasons, MD  tizanidine (ZANAFLEX) 2 MG capsule Take 2 mg by mouth 3 (three) times daily. 05/19/19   [provider]  vitamin B-12 1000 MCG tablet Take 1 tablet (1,000 mcg total) by mouth daily. 04/18/20   Florencia Reasons, MD    Allergies    Other, Pravastatin, Prednisone, and Toradol [ketorolac tromethamine]  Review of Systems   Review of Systems  Constitutional:  Negative for fever.  Cardiovascular:  Positive for leg swelling.  Skin:  Positive for color change and wound.  All other systems reviewed  and are negative.  Physical Exam Updated Vital Signs BP 121/74    Pulse 80    Temp 98 F (36.7 C) (Oral)    Resp 18    SpO2 (!) 88%   Physical Exam Vitals and nursing note reviewed.  Constitutional:      Appearance: He is well-developed.  HENT:     Head: Normocephalic and atraumatic.     Right Ear: External ear normal.     Left Ear: External ear normal.     Nose: Nose normal.  Eyes:     General:        Right eye: No discharge.        Left eye: No discharge.  Cardiovascular:     Rate and Rhythm: Normal rate and regular rhythm.     Pulses:          Dorsalis pedis pulses are detected w/ Doppler on the right side.  Pulmonary:     Effort: Pulmonary effort is normal.  Abdominal:     General: There is no distension.  Musculoskeletal:     Comments: No tenderness to either leg.  See pictures.  There is a foul smell coming from his feet.  Diffuse edema on both legs but more erythema and warmth on the right lower extremity.  Skin:    General: Skin is warm and dry.     Findings: Erythema present.  Neurological:     Mental Status: He is alert.  Psychiatric:        Mood and Affect: Mood is not anxious.       ED Results / Procedures / Treatments   Labs (all labs ordered are listed, but only abnormal results are  displayed) Labs Reviewed  CBC - Abnormal; Notable for the following components:      Result Value   RBC 4.00 (*)    HCT 37.6 (*)    All other components within normal limits  COMPREHENSIVE METABOLIC PANEL - Abnormal; Notable for the following components:   Sodium 129 (*)    Chloride 94 (*)    Glucose, Bld 408 (*)    Calcium 8.8 (*)    All other components within normal limits  SEDIMENTATION RATE - Abnormal; Notable for the following components:   Sed Rate 44 (*)    All other components within normal limits  LACTIC ACID, PLASMA - Abnormal; Notable for the following components:   Lactic Acid, Venous 2.5 (*)    All other components within normal limits  CULTURE, BLOOD (ROUTINE X 2)  CULTURE, BLOOD (ROUTINE X 2)  RESP PANEL BY RT-PCR (FLU A&B, COVID) ARPGX2  BRAIN NATRIURETIC PEPTIDE  C-REACTIVE PROTEIN    EKG EKG Interpretation  Date/Time:  Thursday December 28 2020 18:40:21 EST Ventricular Rate:  74 PR Interval:  163 QRS Duration: 98 QT Interval:  413 QTC Calculation: 459 R Axis:   -34 Text Interpretation: Sinus rhythm Left axis deviation Borderline T wave abnormalities Confirmed by [name] [id number]) on 12/28/2020 7:19:46 PM  Radiology DG Foot Complete Right  Result Date: 12/28/2020 CLINICAL DATA:  Pain and swelling EXAM: RIGHT FOOT COMPLETE - 3+ VIEW COMPARISON:  03/28/2017 FINDINGS: No recent fracture or dislocation is seen. There are no focal lytic lesions. Degenerative changes are noted in first metatarsophalangeal joint and interphalangeal joint of the big toe. Bony spurs seen in the dorsal aspect of intertarsal and tarsometatarsal joints. Plantar spur is seen in the calcaneus. There are coarse calcifications in the region of plantar fascia. Arterial calcifications  are seen in the soft tissues. There is marked soft tissue swelling over the dorsum. IMPRESSION: No recent fracture or dislocation is seen. There are no focal lytic lesions. If there is clinical suspicion  for osteomyelitis, follow-up three-phase bone scan or MRI may be considered. Other findings as described in the body of the report. Electronically Signed   By: Elmer Picker M.D.   On: 12/28/2020 17:08    Procedures .Critical Care Performed by: [name], MD Authorized by: [name], MD   Critical care provider statement:    Critical care time (minutes):  30   Critical care was necessary to treat or prevent imminent or life-threatening deterioration of the following conditions:  Sepsis   Critical care was time spent personally by me on the following activities:  Development of treatment plan with patient or surrogate, discussions with consultants, evaluation of patient's response to treatment, examination of patient, ordering and review of laboratory studies, ordering and review of radiographic studies, ordering and performing treatments and interventions, pulse oximetry, re-evaluation of patient's condition and review of old charts   Medications Ordered in ED Medications  lactated ringers bolus 1,000 mL (has no administration in time range)  vancomycin (VANCOCIN) IVPB 1000 mg/200 mL premix (has no administration in time range)  cefTRIAXone (ROCEPHIN) 2 g in sodium chloride 0.9 % 100 mL IVPB (2 g Intravenous New Bag/Given 12/28/20 1943)    ED Course  I have reviewed the triage vital signs and the nursing notes.  Pertinent labs & imaging results that were available during my care of the patient were reviewed by me and considered in my medical decision making (see chart for details).    MDM Rules/Calculators/A&P                         Patient is hemodynamically stable but his lactate is elevated which is concerning in the setting of a probable cellulitis.  There is no obvious evidence of osteomyelitis.  I do not feel an obvious abscess.  No obvious signs that he has a deep space infection.  He will need admission with IV antibiotics.  He is not in septic shock.   Hospitalist will admit.    Final Clinical Impression(s) / ED Diagnoses Final diagnoses:  Cellulitis of right leg    Rx / DC Orders ED Discharge Orders     None        [name], MD 12/28/20 2008

## 2020-12-28 NOTE — Sepsis Progress Note (Signed)
Elink following Code Sepsis. 

## 2020-12-28 NOTE — H&P (Addendum)
History and Physical    [name] IRJ:[id number] DOB: 05-04-1955 DOA: 12/28/2020  PCP: [name]   Patient coming from: Home  I have personally briefly reviewed patient's old medical records in [place]  Chief Complaint: Leg pain and swelling  HPI: [name] is a 65 y.o. male with medical history significant for diabetic foot ulcer, hypertension, [place], obesity, left foot ray amputation. Patient is not the best historian, when asked questions he easily goes off on a tangent talking about other things.  Patient presented to the ED with complaints of blisters on his right foot that started last night,.  He reports has chronic swelling and slight redness to both legs which are unchanged.  Yesterday he went partying with some friends, when he came home he noticed the blisters on his foot.  He denies any prior symptoms before yesterday. No fevers no chills.  No vomiting.  No chest pain no difficulty breathing.  Denies ever smoking cigarettes.  Denies alcohol use.  ED Course: Temperature 98.  Heart rate 80.  Respiratory rate 18.  Blood pressure systolic 301S to 010X.  O2 sats 98% on room air on my evaluation. WBC 10.4.  BNP 17.  Sodium 129.  ESR 44.  Lactic acid 2.5.  X-ray of the right foot without focal lytic lesion. EKG without significant change from prior. IV vancomycin and ceftriaxone started in the ED.  Hospitalist to admit.  Review of Systems: As per HPI all other systems reviewed and negative.  Past Medical History:  Diagnosis Date   Arthritis    Back pain    Diabetes mellitus without complication ([facility])    Diabetic foot ulcer ([place]) 03/2019   History of kidney stones    Hypertension     Past Surgical History:  Procedure Laterality Date   BACK SURGERY     CARPAL TUNNEL RELEASE     CHOLECYSTECTOMY     FOOT SURGERY     INTRAMEDULLARY (IM) NAIL INTERTROCHANTERIC Right 04/14/2020   Procedure: INTRAMEDULLARY (IM) NAIL INTERTROCHANTRIC;   Surgeon: [name], MD;  Location: [facility];  Service: Orthopedics;  Laterality: Right;   INTRAMEDULLARY (IM) NAIL INTERTROCHANTRIC (Right Leg Upper)  04/14/2020   JOINT REPLACEMENT     REVISION OF TRANSMETATARSAL AMPUTATION Right 05/26/2019   SHOULDER ARTHROSCOPY     STUMP REVISION Left 05/26/2019   Procedure: REVISION LEFT TRANSMETATARSAL AMPUTATION;  Surgeon: [name], MD;  Location: [facility];  Service: Orthopedics;  Laterality: Left;   TOTAL KNEE ARTHROPLASTY     TRANSMETATARSAL AMPUTATION Left 03/27/2019   Procedure: TRANSMETATARSAL AMPUTATION left foot;  Surgeon: [name], MD;  Location: [facility];  Service: Orthopedics;  Laterality: Left;     reports that he has never smoked. He has never used smokeless tobacco. He reports that he does not drink alcohol and does not use drugs.  Allergies  Allergen Reactions   Other Hives    Anti-snake venim for copperhead venom.   Pravastatin Other (See Comments)    Leg cramps, elevated CK   Prednisone Other (See Comments)    Patient states "it runs my blood sugar up so I don't take it."   Toradol [Ketorolac Tromethamine] Hives and Nausea And Vomiting    Pill form only    Family History  Problem Relation Age of Onset   Heart failure Mother    Cancer Father    Prior to Admission medications   Medication Sig Start Date End Date Taking? Authorizing Provider  acetaminophen (TYLENOL) 325 MG tablet Take 2 tablets (650 mg total) by mouth every 4 (four) hours as needed. 04/14/20 04/14/21  Persons, Bevely Palmer, PA  allopurinol (ZYLOPRIM) 100 MG tablet TAKE 1 TABLET BY MOUTH TWICE A DAY 12/28/20   [name], MD  allopurinol (ZYLOPRIM) 300 MG tablet Take 300 mg by mouth daily. 02/19/19   [provider]  amitriptyline (ELAVIL) 25 MG tablet Take 25 mg by mouth at bedtime. 03/11/19   [provider]  amLODipine (NORVASC) 10 MG tablet Take 10 mg by mouth daily. 02/08/16   [provider]  aspirin EC 325 MG tablet Take 1  tablet (325 mg total) by mouth daily. 04/14/20   Persons, Bevely Palmer, PA  cyclobenzaprine (FLEXERIL) 10 MG tablet SMARTSIG:1 Tablet(s) By Mouth Every 12 Hours 03/30/20   [provider]  ferrous sulfate 325 (65 FE) MG tablet Take 1 tablet (325 mg total) by mouth daily. 04/18/20 04/18/21  Shelly Coss, MD  folic acid (FOLVITE) 1 MG tablet Take 1 tablet (1 mg total) by mouth daily. 04/18/20   Florencia Reasons, MD  furosemide (LASIX) 20 MG tablet Take 20 mg by mouth daily. 01/06/19   [provider]  gabapentin (NEURONTIN) 300 MG capsule Take 900 mg by mouth daily. 03/16/19   [provider]  glipiZIDE (GLUCOTROL XL) 2.5 MG 24 hr tablet Take 2.5 mg by mouth every morning. 03/30/20   [provider]  LEVEMIR FLEXTOUCH 100 UNIT/ML Pen Inject 70 Units into the skin 2 (two) times daily.  10/08/13   [provider]  lisinopril (PRINIVIL,ZESTRIL) 40 MG tablet Take 40 mg by mouth daily. 03/05/16   [provider]  metoprolol succinate (TOPROL-XL) 100 MG 24 hr tablet Take 100 mg by mouth daily. 02/04/19   [provider]  MITIGARE 0.6 MG CAPS Take 2 capsules by mouth daily. 05/10/19   [provider]  Multiple Vitamin (MULTIVITAMIN WITH MINERALS) TABS tablet Take 1 tablet by mouth daily. 04/18/20   Florencia Reasons, MD  nitroGLYCERIN (NITRODUR - DOSED IN MG/24 HR) 0.2 mg/hr patch PLACE 1 PATCH (0.2 MG TOTAL) ONTO THE SKIN DAILY. APPLY NEW PATCH REMOVE OLD PATCH DAILY ON FOOT . PLACE IN DIFFERENT PLACE ON TOP OF FOOT 10/11/19   Persons, Bevely Palmer, PA  oxyCODONE 10 MG TABS Take 1 tablet (10 mg total) by mouth every 6 (six) hours as needed for severe pain or moderate pain. 04/20/20   Shelly Coss, MD  pentoxifylline (TRENTAL) 400 MG CR tablet Take 1 tablet (400 mg total) by mouth 3 (three) times daily with meals. 05/13/19   Persons, Bevely Palmer, PA  polyethylene glycol (MIRALAX / GLYCOLAX) 17 g packet Take 17 g by mouth daily as needed for mild constipation. 04/17/20   Florencia Reasons,  MD  thiamine 100 MG tablet Take 1 tablet (100 mg total) by mouth daily. 04/18/20   Florencia Reasons, MD  tizanidine (ZANAFLEX) 2 MG capsule Take 2 mg by mouth 3 (three) times daily. 05/19/19   [provider]  vitamin B-12 1000 MCG tablet Take 1 tablet (1,000 mcg total) by mouth daily. 04/18/20   Florencia Reasons, MD    Physical Exam: Vitals:   12/28/20 1618 12/28/20 1843 12/28/20 1849  BP: (!) 147/81  121/74  Pulse: 79 80 80  Resp: _0 Temp: 98 F (36.7 C)  98 F (36.7 C)  TempSrc: Oral  Oral  SpO2: 95% 93% (!) 88%    Constitutional: Foul smell on entering  the room, from foot or poor hygiene, calm, comfortable Vitals:   12/28/20 1618 12/28/20 1843 12/28/20 1849  BP: (!) 147/81  121/74  Pulse: 79 80 80  Resp: _0 Temp: 98 F (36.7 C)  98 F (36.7 C)  TempSrc: Oral  Oral  SpO2: 95% 93% (!) 88%   Eyes: PERRL, lids and conjunctivae normal ENMT: Mucous membranes are moist.  Neck: normal, supple, no masses, no thyromegaly Respiratory: clear to auscultation bilaterally, no wheezing, no crackles. Normal respiratory effort. No accessory muscle use.  Cardiovascular: Regular rate and rhythm, no murmurs / rubs / gallops.  1+ pitting lower extremity edema to upper leg.  Lower extremities warm. Abdomen: no tenderness, no masses palpated. No hepatosplenomegaly. Bowel sounds positive.  Musculoskeletal: no clubbing / cyanosis.  Left fourth ray amputation.  Good ROM, no contractures.  Skin: Slight erythema to bilateral lower extremity, no differential warmth, no tenderness elicited on palpation. Right foot-swollen, ulceration to dorsal surface, with purulence between toes, foul smell on entering room likely from right foot. Neurologic: No apparent cranial nerve abnormality, moving extremities spontaneously.Marland Kitchen  Psychiatric: Normal judgment and insight. Alert and oriented x 3. Normal mood.         Labs on Admission: I have personally reviewed following labs and imaging  studies  CBC: Recent Labs  Lab 12/28/20 1727  WBC 10.4  HGB 13.3  HCT 37.6*  MCV 94.0  PLT 720   Basic Metabolic Panel: Recent Labs  Lab 12/28/20 1727  NA 129*  K 4.3  CL 94*  CO2 25  GLUCOSE 408*  BUN 22  CREATININE 1.20  CALCIUM 8.8*   Liver Function Tests: Recent Labs  Lab 12/28/20 1727  AST 17  ALT 18  ALKPHOS 113  BILITOT 1.1  PROT 8.0  ALBUMIN 4.1   Urine analysis:    Component Value Date/Time   COLORURINE YELLOW 04/13/2020 0035   APPEARANCEUR CLOUDY (A) 04/13/2020 0035   LABSPEC 1.016 04/13/2020 0035   PHURINE 5.0 04/13/2020 0035   GLUCOSEU 150 (A) 04/13/2020 0035   HGBUR MODERATE (A) 04/13/2020 0035   BILIRUBINUR NEGATIVE 04/13/2020 0035   KETONESUR 80 (A) 04/13/2020 0035   PROTEINUR NEGATIVE 04/13/2020 0035   NITRITE NEGATIVE 04/13/2020 0035   LEUKOCYTESUR LARGE (A) 04/13/2020 0035    Radiological Exams on Admission: DG Foot Complete Right  Result Date: 12/28/2020 CLINICAL DATA:  Pain and swelling EXAM: RIGHT FOOT COMPLETE - 3+ VIEW COMPARISON:  03/28/2017 FINDINGS: No recent fracture or dislocation is seen. There are no focal lytic lesions. Degenerative changes are noted in first metatarsophalangeal joint and interphalangeal joint of the big toe. Bony spurs seen in the dorsal aspect of intertarsal and tarsometatarsal joints. Plantar spur is seen in the calcaneus. There are coarse calcifications in the region of plantar fascia. Arterial calcifications are seen in the soft tissues. There is marked soft tissue swelling over the dorsum. IMPRESSION: No recent fracture or dislocation is seen. There are no focal lytic lesions. If there is clinical suspicion for osteomyelitis, follow-up three-phase bone scan or MRI may be considered. Other findings as described in the body of the report. Electronically Signed   By: Elmer Picker M.D.   On: 12/28/2020 17:08    EKG: Independently reviewed.  Sinus rhythm rate 74.  QTc 459.  No significant change from  prior.  Assessment/Plan Principal Problem:   Cellulitis of right foot Active Problems:   Uncontrolled type 2 diabetes mellitus with hyperglycemia, with long-term current use of insulin ([place])  Essential hypertension, benign   NASH (nonalcoholic steatohepatitis)   Obesity   Diabetic foot ulcer ([facility])   Lactic acidosis   Cellulitis of the right foot, diabetic foot ulcer-reports onset of symptoms yesterday, but exam suggests otherwise.  Foul smell in room from foot or poor hygiene. He rules out for sepsis.  He is afebrile.  WBC 10.4.  Lactic acid of 2.5.  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 -Continue broad-spectrum antibiotics IV vancomycin and ceftriaxone and add IV metronidazole -Follow-up blood cultures -Obtain CT with contrast -Trend lactic acid -Ankle-brachial index right lower extremity -1 L RL bolus given  Diabetes mellitus with hyperglycemia-glucose 408 > 297 .  Tells me he takes 25 units of Levemir once or sometimes twice a day (med list states 70 units twice daily). - 1 l bolus - HgbA1c - SSI- M -Resume home Levemir 25 u daily -Hold glipizide  Hyponatremia- 129, corrected- 134.   Hypertension- Stable.  He is unable to tell me the names of the medications he takes.   -Hold lisinopril contrast exposure -Pending med reconciliation, resume Norvasc 10, metoprolol XR 121m daily  NASH liver cirrhosis-stable. -Hold Lasix for mild lactic acidosis, hyperglycemia requiring some fluids  Gout -Resume allopurinol   DVT prophylaxis: Heparin Code Status: Full Code Family Communication: None at Bedside Disposition Plan:  > 2 days Consults called: None, pending CT findings. Admission status: inpt, Med surg I certify that at the point of admission it is my clinical judgment that the patient will require inpatient hospital care spanning beyond 2 midnights from the point of admission due to high intensity of service, high risk for further deterioration and  high frequency of surveillance required.    EBethena RoysMD [facility]  12/28/2020, 10:14 PM

## 2020-12-29 ENCOUNTER — Encounter (HOSPITAL_COMMUNITY): Payer: Self-pay | Admitting: Internal Medicine

## 2020-12-29 ENCOUNTER — Inpatient Hospital Stay (HOSPITAL_COMMUNITY): Payer: Medicaid Other

## 2020-12-29 ENCOUNTER — Other Ambulatory Visit: Payer: Self-pay

## 2020-12-29 LAB — BASIC METABOLIC PANEL
Anion gap: 8 (ref 5–15)
BUN: 18 mg/dL (ref 8–23)
CO2: 26 mmol/L (ref 22–32)
Calcium: 8.5 mg/dL — ABNORMAL LOW (ref 8.9–10.3)
Chloride: 100 mmol/L (ref 98–111)
Creatinine, Ser: 1.03 mg/dL (ref 0.61–1.24)
GFR, Estimated: >60 mL/min (ref >60)
Glucose, Bld: 217 mg/dL — ABNORMAL HIGH (ref 70–99)
Potassium: 3.5 mmol/L (ref 3.5–5.1)
Sodium: 134 mmol/L — ABNORMAL LOW (ref 135–145)

## 2020-12-29 LAB — CBC
HCT: 34 % — ABNORMAL LOW (ref 39.0–52.0)
Hemoglobin: 12.1 g/dL — ABNORMAL LOW (ref 13.0–17.0)
MCH: 33.2 pg (ref 26.0–34.0)
MCHC: 35.6 g/dL (ref 30.0–36.0)
MCV: 93.2 fL (ref 80.0–100.0)
Platelets: 148 10*3/uL — ABNORMAL LOW (ref 150–400)
RBC: 3.65 MIL/uL — ABNORMAL LOW (ref 4.22–5.81)
RDW: 13.3 % (ref 11.5–15.5)
WBC: 8.9 10*3/uL (ref 4.0–10.5)
nRBC: 0 % (ref 0.0–0.2)

## 2020-12-29 LAB — RAPID URINE DRUG SCREEN, HOSP PERFORMED
Amphetamines: NOT DETECTED
Barbiturates: NOT DETECTED
Benzodiazepines: NOT DETECTED
Cocaine: NOT DETECTED
Opiates: POSITIVE — AB
Tetrahydrocannabinol: NOT DETECTED

## 2020-12-29 LAB — GLUCOSE, CAPILLARY
Glucose-Capillary: 186 mg/dL — ABNORMAL HIGH (ref 70–99)
Glucose-Capillary: 238 mg/dL — ABNORMAL HIGH (ref 70–99)
Glucose-Capillary: 248 mg/dL — ABNORMAL HIGH (ref 70–99)
Glucose-Capillary: 253 mg/dL — ABNORMAL HIGH (ref 70–99)
Glucose-Capillary: 256 mg/dL — ABNORMAL HIGH (ref 70–99)
Glucose-Capillary: 318 mg/dL — ABNORMAL HIGH (ref 70–99)

## 2020-12-29 LAB — HEMOGLOBIN A1C
Hgb A1c MFr Bld: 8.2 % — ABNORMAL HIGH (ref 4.8–5.6)
Mean Plasma Glucose: 188.64 mg/dL

## 2020-12-29 MED ORDER — OXYCODONE HCL 5 MG PO TABS
10.0000 mg | ORAL_TABLET | Freq: Four times a day (QID) | ORAL | Status: DC | PRN
  Administered 2020-12-29 – 2021-01-03 (×15): 10 mg via ORAL
  Filled 2020-12-29 (×16): qty 2

## 2020-12-29 MED ORDER — HEPARIN SODIUM (PORCINE) 5000 UNIT/ML IJ SOLN
5000.0000 [IU] | Freq: Three times a day (TID) | INTRAMUSCULAR | Status: DC
  Administered 2020-12-29 – 2021-01-05 (×23): 5000 [IU] via SUBCUTANEOUS
  Filled 2020-12-29 (×24): qty 1

## 2020-12-29 MED ORDER — ONDANSETRON HCL 4 MG/2ML IJ SOLN: 4.0000 mg | Freq: Four times a day (QID) | INTRAMUSCULAR | Status: DC | PRN

## 2020-12-29 MED ORDER — ACETAMINOPHEN 325 MG PO TABS
650.0000 mg | ORAL_TABLET | Freq: Four times a day (QID) | ORAL | Status: DC | PRN
  Administered 2021-01-02: 22:00:00 650 mg via ORAL
  Filled 2020-12-29: qty 2

## 2020-12-29 MED ORDER — POLYETHYLENE GLYCOL 3350 17 G PO PACK: 17.0000 g | PACK | Freq: Every day | ORAL | Status: DC | PRN

## 2020-12-29 MED ORDER — VANCOMYCIN HCL 1250 MG/250ML IV SOLN
1250.0000 mg | Freq: Two times a day (BID) | INTRAVENOUS | Status: DC
  Administered 2020-12-29 – 2020-12-30 (×3): 1250 mg via INTRAVENOUS
  Filled 2020-12-29 (×5): qty 250

## 2020-12-29 MED ORDER — ACETAMINOPHEN 650 MG RE SUPP: 650.0000 mg | Freq: Four times a day (QID) | RECTAL | Status: DC | PRN

## 2020-12-29 MED ORDER — ONDANSETRON HCL 4 MG PO TABS: 4.0000 mg | ORAL_TABLET | Freq: Four times a day (QID) | ORAL | Status: DC | PRN

## 2020-12-29 MED ORDER — INSULIN DETEMIR 100 UNIT/ML ~~LOC~~ SOLN
25.0000 [IU] | Freq: Every day | SUBCUTANEOUS | Status: DC
  Administered 2020-12-29 – 2020-12-30 (×3): 25 [IU] via SUBCUTANEOUS
  Filled 2020-12-29 (×5): qty 0.25

## 2020-12-29 NOTE — Progress Notes (Signed)
Upon entering pts room for assessment, pt asked this nurse if his pain medication from home was still in his pocket. This nurse found a Rx bottle of Oxycodone #8. Pt informed me there should have been more than #8 but was unsure of an exact quantity. Pt A&O x2-3 and talking off on a tangent about other things while this nurse asks questions. Informed pt and charge nurse that I would send the Rx bottle to pharmacy during his stay here. Pt verbally understood but refused to sign pharmacy slip. MD made aware that he would need something for pain during his stay.

## 2020-12-29 NOTE — Progress Notes (Signed)
Progress Note    [name]  NID:[id number] DOB: 1955/08/22  DOA: 12/28/2020 PCP: [name] Hornick    Brief Narrative:     Medical records reviewed and are as summarized below:  [name] is an 65 y.o. male with medical history significant for diabetic foot ulcer, hypertension, [place], obesity, left foot ray amputation. Now presenting with right foot cellulitis/ulcer. Patient is not the best historian, when asked questions he easily goes off on a tangent talking about other things.  Assessment/Plan:   Principal Problem:   Cellulitis of right foot Active Problems:   Uncontrolled type 2 diabetes mellitus with hyperglycemia, with long-term current use of insulin (HCC)   Essential hypertension, benign   NASH (nonalcoholic steatohepatitis)   Obesity   Diabetic foot ulcer (HCC)   Lactic acidosis   Cellulitis of the right foot, diabetic foot ulcer -reports onset of symptoms yesterday, but exam suggests otherwise.  Foul smell in room from foot or poor hygiene.  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 -Continue broad-spectrum antibiotics IV vancomycin and ceftriaxone and add IV metronidazole -blood cultures pending -MRI pending -ABIs pending- if he gets to [facility] prior to being done, new order will need to be placed -CT with contrast: Considerable skin thickening and soft tissue edema, suspect for cellulitis. No rim enhancing fluid collections to suggest soft tissue abscess at this time. -LA normalized -discussed with [name] who will see patient in consult at [facility], please notify him when he arrives at [facility]       Diabetes mellitus with hyperglycemia-glucose 408 > 297 .   -not consistent with his insulin at home - HgbA1c pending - SSI- M -Resume home Levemir 25 u daily -Hold glipizide   Hyponatremia-  -Na improved to 134   Hypertension- Stable.  He is unable to tell me the names of the medications he takes.   -Hold  lisinopril contrast exposure -BP Stable   NASH liver cirrhosis-stable. -Hold Lasix for mild lactic acidosis   Gout -Resume allopurinol     Family Communication/Anticipated D/C date and plan/Code Status   DVT prophylaxis: Lovenox ordered. Code Status: Full Code.  Disposition Plan: Status is: Inpatient  Remains inpatient appropriate because: needs further eval by ortho/duda       Medical Consultants:   Sharol Given    Subjective:   Difficult to re-direct, worried about his pain meds  Objective:    Vitals:   12/28/20 2100 12/28/20 2200 12/28/20 2318 12/29/20 0009  BP: 129/76  132/75   Pulse: 80  83   Resp: 16  20   Temp:   99.6 F (37.6 C)   TempSrc:   Oral   SpO2: 95% 95% 100% 99%  Weight:      Height:        Intake/Output Summary (Last 24 hours) at 12/29/2020 1228 Last data filed at 12/29/2020 0500 Gross per 24 hour  Intake --  Output 500 ml  Net -500 ml   Filed Weights   12/28/20 2044  Weight: 100 kg    Exam:  General: Appearance:     Overweight male in no acute distress     Lungs:     respirations unlabored  Heart:    Normal heart rate.  Foot:    MS:   Amputation on left foot   Neurologic:   Awake, alert, can be difficult to re-direct     Data Reviewed:   I have personally reviewed following labs and imaging studies:  Labs: Labs show the following:   Basic Metabolic Panel: Recent Labs  Lab 12/28/20 1727 12/29/20 0620  NA 129* 134*  K 4.3 3.5  CL 94* 100  CO2 25 26  GLUCOSE 408* 217*  BUN 22 18  CREATININE 1.20 1.03  CALCIUM 8.8* 8.5*   GFR Estimated Creatinine Clearance: 92.7 mL/min (by C-G formula based on SCr of 1.03 mg/dL). Liver Function Tests: Recent Labs  Lab 12/28/20 1727  AST 17  ALT 18  ALKPHOS 113  BILITOT 1.1  PROT 8.0  ALBUMIN 4.1   No results for input(s): LIPASE, AMYLASE in the last 168 hours. No results for input(s): AMMONIA in the last 168 hours. Coagulation profile No results for input(s): INR,  PROTIME in the last 168 hours.  CBC: Recent Labs  Lab 12/28/20 1727 12/29/20 0620  WBC 10.4 8.9  HGB 13.3 12.1*  HCT 37.6* 34.0*  MCV 94.0 93.2  PLT 214 148*   Cardiac Enzymes: No results for input(s): CKTOTAL, CKMB, CKMBINDEX, TROPONINI in the last 168 hours. BNP (last 3 results) No results for input(s): PROBNP in the last 8760 hours. CBG: Recent Labs  Lab 12/28/20 2058 12/29/20 0035 12/29/20 0446 12/29/20 0748 12/29/20 1120  GLUCAP 297* 256* 253* 186* 238*   D-Dimer: No results for input(s): DDIMER in the last 72 hours. Hgb A1c: No results for input(s): HGBA1C in the last 72 hours. Lipid Profile: No results for input(s): CHOL, HDL, LDLCALC, TRIG, CHOLHDL, LDLDIRECT in the last 72 hours. Thyroid function studies: No results for input(s): TSH, T4TOTAL, T3FREE, THYROIDAB in the last 72 hours.  Invalid input(s): FREET3 Anemia work up: No results for input(s): VITAMINB12, FOLATE, FERRITIN, TIBC, IRON, RETICCTPCT in the last 72 hours. Sepsis Labs: Recent Labs  Lab 12/28/20 1727 12/28/20 2033 12/29/20 0620  WBC 10.4  --  8.9  LATICACIDVEN 2.5* 1.4  --     Microbiology Recent Results (from the past 240 hour(s))  Blood culture (routine x 2)     Status: None (Preliminary result)   Collection Time: 12/28/20  5:27 PM   Specimen: BLOOD  Result Value Ref Range Status   Specimen Description BLOOD BLOOD RIGHT ARM  Final   Special Requests   Final    BOTTLES DRAWN AEROBIC AND ANAEROBIC Blood Culture adequate volume   Culture   Final    NO GROWTH < 12 HOURS Performed at [facility], [address], [place], [place] [address]    Report Status PENDING  Incomplete  Blood culture (routine x 2)     Status: None (Preliminary result)   Collection Time: 12/28/20  5:27 PM   Specimen: BLOOD  Result Value Ref Range Status   Specimen Description BLOOD BLOOD LEFT ARM  Final   Special Requests   Final    BOTTLES DRAWN AEROBIC AND ANAEROBIC Blood Culture adequate volume    Culture   Final    NO GROWTH < 12 HOURS Performed at [facility], [address] [place], [place] [address]    Report Status PENDING  Incomplete  Resp Panel by RT-PCR (Flu A&B, Covid) Nasopharyngeal Swab     Status: None   Collection Time: 12/28/20  7:18 PM   Specimen: Nasopharyngeal Swab; Nasopharyngeal(NP) swabs in vial transport medium  Result Value Ref Range Status   SARS Coronavirus 2 by RT PCR NEGATIVE NEGATIVE Final    Comment: (NOTE) SARS-CoV-2 target nucleic acids are NOT DETECTED.  The SARS-CoV-2 RNA is generally detectable in upper respiratory specimens during the acute phase of  infection. The lowest concentration of SARS-CoV-2 viral copies this assay can detect is 138 copies/mL. A negative result does not preclude SARS-Cov-2 infection and should not be used as the sole basis for treatment or other patient management decisions. A negative result may occur with  improper specimen collection/handling, submission of specimen other than nasopharyngeal swab, presence of viral mutation(s) within the areas targeted by this assay, and inadequate number of viral copies(<138 copies/mL). A negative result must be combined with clinical observations, patient history, and epidemiological information. The expected result is Negative.  Fact Sheet for Patients:  EntrepreneurPulse.com.au  Fact Sheet for Healthcare Providers:  IncredibleEmployment.be  This test is no t yet approved or cleared by the [place] FDA and  has been authorized for detection and/or diagnosis of SARS-CoV-2 by FDA under an Emergency Use Authorization (EUA). This EUA will remain  in effect (meaning this test can be used) for the duration of the COVID-19 declaration under Section 564(b)(1) of the Act, 21 U.S.C.section 360bbb-3(b)(1), unless the authorization is terminated  or revoked sooner.       Influenza A by PCR NEGATIVE NEGATIVE Final   Influenza B by PCR NEGATIVE  NEGATIVE Final    Comment: (NOTE) The Xpert Xpress SARS-CoV-2/FLU/RSV plus assay is intended as an aid in the diagnosis of influenza from Nasopharyngeal swab specimens and should not be used as a sole basis for treatment. Nasal washings and aspirates are unacceptable for Xpert Xpress SARS-CoV-2/FLU/RSV testing.  Fact Sheet for Patients: EntrepreneurPulse.com.au  Fact Sheet for Healthcare Providers: IncredibleEmployment.be  This test is not yet approved or cleared by the [place] FDA and has been authorized for detection and/or diagnosis of SARS-CoV-2 by FDA under an Emergency Use Authorization (EUA). This EUA will remain in effect (meaning this test can be used) for the duration of the COVID-19 declaration under Section 564(b)(1) of the Act, 21 U.S.C. section 360bbb-3(b)(1), unless the authorization is terminated or revoked.  Performed at [facility], [address], [place], [place] [address]     Procedures and diagnostic studies:  CT FOOT RIGHT W CONTRAST  Result Date: 12/28/2020 CLINICAL DATA:  Foot pain swelling blisters EXAM: CT OF THE LOWER RIGHT EXTREMITY WITH CONTRAST TECHNIQUE: Multidetector CT imaging of the lower right extremity was performed according to the standard protocol following intravenous contrast administration. CONTRAST:  22m OMNIPAQUE IOHEXOL 300 MG/ML  SOLN COMPARISON:  Radiograph 12/28/2020 FINDINGS: Bones/Joint/Cartilage No acute fracture or malalignment. No significant ankle effusion. No gross osseous destructive change. Moderate intertarsal degenerative changes with scattered small erosions. Degenerative changes with joint space narrowing at the MTP and IP joints. Small erosion base of first proximal phalanx. Ligaments Suboptimally assessed by CT. Muscles and Tendons No intramuscular fluid collections. Achilles tendon appears intact. Bulky plantar fascia calcifications. There is mild atrophy of the plantar muscles.  Soft tissues Skin thickening and considerable soft tissue edema. Fluid at the distal plantar aspect of the foot without rim enhancing abscess collection. Surface irregularity at the plantar aspect of the distal foot skin surface could relate to history of blisters. No soft tissue emphysema. IMPRESSION: 1. Considerable skin thickening and soft tissue edema, suspect for cellulitis. No rim enhancing fluid collections to suggest soft tissue abscess at this time. 2. No frank osseous destructive change to suggest osteomyelitis. Moderate degenerative changes are present at the tarsal bones, TMT and MTP joints as well as the digits of the foot. There are scattered erosions. Findings could be secondary to inflammatory arthritis though early changes of neuropathic joint disease  could also be considered. If clinical suspicion for osteomyelitis remains high, would further evaluate with MRI. Electronically Signed   By: Donavan Foil M.D.   On: 12/28/2020 22:12   DG Foot Complete Right  Result Date: 12/28/2020 CLINICAL DATA:  Pain and swelling EXAM: RIGHT FOOT COMPLETE - 3+ VIEW COMPARISON:  03/28/2017 FINDINGS: No recent fracture or dislocation is seen. There are no focal lytic lesions. Degenerative changes are noted in first metatarsophalangeal joint and interphalangeal joint of the big toe. Bony spurs seen in the dorsal aspect of intertarsal and tarsometatarsal joints. Plantar spur is seen in the calcaneus. There are coarse calcifications in the region of plantar fascia. Arterial calcifications are seen in the soft tissues. There is marked soft tissue swelling over the dorsum. IMPRESSION: No recent fracture or dislocation is seen. There are no focal lytic lesions. If there is clinical suspicion for osteomyelitis, follow-up three-phase bone scan or MRI may be considered. Other findings as described in the body of the report. Electronically Signed   By: Elmer Picker M.D.   On: 12/28/2020 17:08    Medications:     heparin  5,000 Units Subcutaneous Q8H   insulin aspart  0-15 Units Subcutaneous Q4H   insulin detemir  25 Units Subcutaneous QHS   Continuous Infusions:  cefTRIAXone (ROCEPHIN)  IV     metronidazole 500 mg (12/29/20 0852)   vancomycin       LOS: 1 day   Geradine Girt  [facility]   How to contact the [facility] Attending or Consulting provider [place] or covering provider during after hours [place], for this patient?  Check the care team in [facility] and look for a) attending/consulting [facility] provider listed and b) the [facility] team listed Log into [email or web address] and use [place]'s universal password to access. If you do not have the password, please contact the hospital operator. Locate the [facility] provider you are looking for under [facility] and page to a number that you can be directly reached. If you still have difficulty reaching the provider, please page the [facility] (Director on Call) for the Hospitalists listed on amion for assistance.  12/29/2020, 12:28 PM

## 2020-12-29 NOTE — Plan of Care (Signed)
  Problem: Education: Goal: Knowledge of General Education information will improve Description Including pain rating scale, medication(s)/side effects and non-pharmacologic comfort measures Outcome: Progressing   Problem: Health Behavior/Discharge Planning: Goal: Ability to manage health-related needs will improve Outcome: Progressing   

## 2020-12-29 NOTE — Progress Notes (Signed)
Was called to this pts room by radiology, pt was refusing to go with them to complete MRI. This nurse tried talking with pt about the importance of this test and he still refused. MD made aware.

## 2020-12-29 NOTE — Progress Notes (Signed)
°  Transition of Care [facility]) Screening Note   Patient Details  Name: [name] Date of Birth: February 26, 1955   Transition of [facility]) CM/SW Contact:    Boneta Lucks, RN Phone Number: 12/29/2020, 12:20 PM    Transition of Care Department [facility]) has reviewed patient and no TOC needs have been identified at this time. We will continue to monitor patient advancement through interdisciplinary progression rounds. If new patient transition needs arise, please place a TOC consult.

## 2020-12-29 NOTE — Consult Note (Signed)
[place] Nurse wound consult note Consultation was completed by review of records, images and assistance from the bedside nurse/clinical staff.  Reason for Consult:RLE wound Patient with history of DM, left ray amputation. Wound type: neuropathic foot ulceration, appears to ruptured blister with open wound right great toe tip and skin changes of the right 2nd and 5th toes.  Xrays do not indicate osteomyelitis with patient's history may be valuable to obtain MRI  Pressure Injury POA: NA Measurement: see nursing flow sheet Wound bed: dark purple areas on the 2nd and webspace of the 5th toe, large flap of macerated skin on the plantar surface of the right foot, yellow open area on the great toe tip  Drainage (amount, consistency, odor) minimal drainage, odor present. Periwound: macerated toe tips; edema  Dressing procedure/placement/frequency:  Dry dressing for now, wound recommend evaluation by podiatry   Discussed POC with patient and bedside nurse.  Re consult if needed, will not follow at this time. Thanks  Lecretia Buczek [employer], RN,CWOCN, CNS, [place] [phone])

## 2020-12-30 LAB — CBC
HCT: 31.2 % — ABNORMAL LOW (ref 39.0–52.0)
Hemoglobin: 10.9 g/dL — ABNORMAL LOW (ref 13.0–17.0)
MCH: 32.4 pg (ref 26.0–34.0)
MCHC: 34.9 g/dL (ref 30.0–36.0)
MCV: 92.9 fL (ref 80.0–100.0)
Platelets: 132 10*3/uL — ABNORMAL LOW (ref 150–400)
RBC: 3.36 MIL/uL — ABNORMAL LOW (ref 4.22–5.81)
RDW: 13.2 % (ref 11.5–15.5)
WBC: 6.2 10*3/uL (ref 4.0–10.5)
nRBC: 0 % (ref 0.0–0.2)

## 2020-12-30 LAB — GLUCOSE, CAPILLARY
Glucose-Capillary: 147 mg/dL — ABNORMAL HIGH (ref 70–99)
Glucose-Capillary: 155 mg/dL — ABNORMAL HIGH (ref 70–99)
Glucose-Capillary: 183 mg/dL — ABNORMAL HIGH (ref 70–99)
Glucose-Capillary: 223 mg/dL — ABNORMAL HIGH (ref 70–99)
Glucose-Capillary: 243 mg/dL — ABNORMAL HIGH (ref 70–99)
Glucose-Capillary: 248 mg/dL — ABNORMAL HIGH (ref 70–99)
Glucose-Capillary: 272 mg/dL — ABNORMAL HIGH (ref 70–99)
Glucose-Capillary: 306 mg/dL — ABNORMAL HIGH (ref 70–99)

## 2020-12-30 LAB — BASIC METABOLIC PANEL
Anion gap: 9 (ref 5–15)
BUN: 14 mg/dL (ref 8–23)
CO2: 25 mmol/L (ref 22–32)
Calcium: 8.4 mg/dL — ABNORMAL LOW (ref 8.9–10.3)
Chloride: 101 mmol/L (ref 98–111)
Creatinine, Ser: 0.93 mg/dL (ref 0.61–1.24)
GFR, Estimated: >60 mL/min (ref >60)
Glucose, Bld: 171 mg/dL — ABNORMAL HIGH (ref 70–99)
Potassium: 3.4 mmol/L — ABNORMAL LOW (ref 3.5–5.1)
Sodium: 135 mmol/L (ref 135–145)

## 2020-12-30 MED ORDER — POTASSIUM CHLORIDE CRYS ER 20 MEQ PO TBCR
40.0000 meq | EXTENDED_RELEASE_TABLET | Freq: Once | ORAL | Status: AC
  Administered 2020-12-30: 40 meq via ORAL
  Filled 2020-12-30: qty 2

## 2020-12-30 NOTE — Progress Notes (Signed)
Spoke with [name] at [facility] and gave report on pt.

## 2020-12-30 NOTE — Progress Notes (Signed)
Progress Note    [name]  TKZ:[id number] DOB: 06-Oct-1955  DOA: 12/28/2020 PCP: [name] [place]    Brief Narrative:     Medical records reviewed and are as summarized below:  [name] is an 65 y.o. male with medical history significant for diabetic foot ulcer, hypertension, [place], obesity, left foot ray amputation. Now presenting with right foot cellulitis/ulcer. Patient is not the best historian, when asked questions he easily goes off on a tangent talking about other things and can be difficult to redirect.  Will need transfer to [facility] for evaluation by [name] and possible vascular.      Assessment/Plan:   Principal Problem:   Cellulitis of right foot Active Problems:   Uncontrolled type 2 diabetes mellitus with hyperglycemia, with long-term current use of insulin (HCC)   Essential hypertension, benign   NASH (nonalcoholic steatohepatitis)   Obesity   Diabetic foot ulcer (HCC)   Lactic acidosis   Cellulitis of the right foot, diabetic foot ulcer -reports onset of symptoms yesterday, but exam suggests otherwise.  Foul smell in room from foot or poor hygiene.  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 --CT with contrast: Considerable skin thickening and soft tissue edema, suspect for cellulitis. No rim enhancing fluid collections to suggest soft tissue abscess at this time. 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 Degree of arterial stenosis can be better evaluated with CT angiography. -discussed with [name] who will see patient in consult at [facility], please notify him  when he arrives at [facility] (patient resistant to have vascular involved until seen by [name])       Diabetes mellitus with hyperglycemia.   -not consistent with his insulin at home - HgbA1c 8.2-- 8 months ago was 7.2 - SSI- M -Resume home Levemir 25 u daily -Hold glipizide   Hyponatremia-  -Na improved to 134   Hypokalemia -replete  Hypertension- Stable.    -Hold lisinopril contrast exposure -BP Stable off meds   NASH liver cirrhosis-stable. -Hold Lasix for mild lactic acidosis   Gout -Resume allopurinol     Family Communication/Anticipated D/C date and plan/Code Status   DVT prophylaxis: Lovenox ordered. Code Status: Full Code.  Disposition Plan: Status is: Inpatient  Remains inpatient appropriate because: needs further eval by ortho/duda       Medical Consultants:   Sharol Given    Subjective:   "Does not want to be cut on again but trust [name]"  Objective:    Vitals:   12/29/20 0009 12/29/20 1328 12/29/20 2007 12/30/20 0500  BP:  (!) 113/59 121/63 116/62  Pulse:  99 93 90  Resp:  18 20 20   Temp:   98.9 F (37.2 C) 98.1 F (36.7 C)  TempSrc:   Oral Oral  SpO2: 99% 98% 100% 100%  Weight:      Height:        Intake/Output Summary (Last 24 hours) at 12/30/2020 1117 Last data filed at 12/30/2020 0612 Gross per 24 hour  Intake 1608.53 ml  Output 2550 ml  Net -941.47 ml   Filed Weights   12/28/20 2044  Weight: 100 kg    Exam:  General: Appearance:     Overweight male  in no acute distress     Lungs:     respirations unlabored  Heart:    Normal heart rate.  Right Foot:    MS:   Transmet Amputation on left foot   Neurologic:   Awake, alert, can be difficult to re-direct     Data Reviewed:   I have personally reviewed following labs and imaging studies:  Labs: Labs show the following:   Basic Metabolic Panel: Recent Labs  Lab 12/28/20 1727 12/29/20 0620 12/30/20 0443  NA 129* 134* 135  K 4.3 3.5 3.4*  CL 94* 100 101  CO2  25 26 25   GLUCOSE 408* 217* 171*  BUN 22 18 14   CREATININE 1.20 1.03 0.93  CALCIUM 8.8* 8.5* 8.4*   GFR Estimated Creatinine Clearance: 102.7 mL/min (by C-G formula based on SCr of 0.93 mg/dL). Liver Function Tests: Recent Labs  Lab 12/28/20 1727  AST 17  ALT 18  ALKPHOS 113  BILITOT 1.1  PROT 8.0  ALBUMIN 4.1   No results for input(s): LIPASE, AMYLASE in the last 168 hours. No results for input(s): AMMONIA in the last 168 hours. Coagulation profile No results for input(s): INR, PROTIME in the last 168 hours.  CBC: Recent Labs  Lab 12/28/20 1727 12/29/20 0620 12/30/20 0443  WBC 10.4 8.9 6.2  HGB 13.3 12.1* 10.9*  HCT 37.6* 34.0* 31.2*  MCV 94.0 93.2 92.9  PLT 214 148* 132*   Cardiac Enzymes: No results for input(s): CKTOTAL, CKMB, CKMBINDEX, TROPONINI in the last 168 hours. BNP (last 3 results) No results for input(s): PROBNP in the last 8760 hours. CBG: Recent Labs  Lab 12/29/20 1552 12/29/20 2003 12/30/20 0152 12/30/20 0545 12/30/20 0737  GLUCAP 318* 248* 183* 155* 147*   D-Dimer: No results for input(s): DDIMER in the last 72 hours. Hgb A1c: Recent Labs    12/29/20 0620  HGBA1C 8.2*   Lipid Profile: No results for input(s): CHOL, HDL, LDLCALC, TRIG, CHOLHDL, LDLDIRECT in the last 72 hours. Thyroid function studies: No results for input(s): TSH, T4TOTAL, T3FREE, THYROIDAB in the last 72 hours.  Invalid input(s): FREET3 Anemia work up: No results for input(s): VITAMINB12, FOLATE, FERRITIN, TIBC, IRON, RETICCTPCT in the last 72 hours. Sepsis Labs: Recent Labs  Lab 12/28/20 1727 12/28/20 2033 12/29/20 0620 12/30/20 0443  WBC 10.4  --  8.9 6.2  LATICACIDVEN 2.5* 1.4  --   --     Microbiology Recent Results (from the past 240 hour(s))  Blood culture (routine x 2)     Status: None (Preliminary result)   Collection Time: 12/28/20  5:27 PM   Specimen: BLOOD  Result Value Ref Range Status   Specimen Description BLOOD BLOOD RIGHT ARM  Final    Special Requests   Final    BOTTLES DRAWN AEROBIC AND ANAEROBIC Blood Culture adequate volume   Culture   Final    NO GROWTH 2 DAYS Performed at [facility], [address] [place], [place] [address]    Report Status PENDING  Incomplete  Blood culture (routine x 2)     Status: None (Preliminary result)   Collection Time: 12/28/20  5:27 PM   Specimen: BLOOD  Result Value Ref Range Status   Specimen Description BLOOD BLOOD LEFT ARM  Final   Special Requests   Final    BOTTLES DRAWN AEROBIC AND ANAEROBIC Blood Culture adequate volume   Culture   Final    NO GROWTH 2 DAYS Performed at [facility], [address], [place],  [place] [address]    Report Status PENDING  Incomplete  Resp Panel by RT-PCR (Flu A&B, Covid) Nasopharyngeal Swab     Status: None   Collection Time: 12/28/20  7:18 PM   Specimen: Nasopharyngeal Swab; Nasopharyngeal(NP) swabs in vial transport medium  Result Value Ref Range Status   SARS Coronavirus 2 by RT PCR NEGATIVE NEGATIVE Final    Comment: (NOTE) SARS-CoV-2 target nucleic acids are NOT DETECTED.  The SARS-CoV-2 RNA is generally detectable in upper respiratory specimens during the acute phase of infection. The lowest concentration of SARS-CoV-2 viral copies this assay can detect is 138 copies/mL. A negative result does not preclude SARS-Cov-2 infection and should not be used as the sole basis for treatment or other patient management decisions. A negative result may occur with  improper specimen collection/handling, submission of specimen other than nasopharyngeal swab, presence of viral mutation(s) within the areas targeted by this assay, and inadequate number of viral copies(<138 copies/mL). A negative result must be combined with clinical observations, patient history, and epidemiological information. The expected result is Negative.  Fact Sheet for Patients:  EntrepreneurPulse.com.au  Fact Sheet for Healthcare Providers:   IncredibleEmployment.be  This test is no t yet approved or cleared by the [place] FDA and  has been authorized for detection and/or diagnosis of SARS-CoV-2 by FDA under an Emergency Use Authorization (EUA). This EUA will remain  in effect (meaning this test can be used) for the duration of the COVID-19 declaration under Section 564(b)(1) of the Act, 21 U.S.C.section 360bbb-3(b)(1), unless the authorization is terminated  or revoked sooner.       Influenza A by PCR NEGATIVE NEGATIVE Final   Influenza B by PCR NEGATIVE NEGATIVE Final    Comment: (NOTE) The Xpert Xpress SARS-CoV-2/FLU/RSV plus assay is intended as an aid in the diagnosis of influenza from Nasopharyngeal swab specimens and should not be used as a sole basis for treatment. Nasal washings and aspirates are unacceptable for Xpert Xpress SARS-CoV-2/FLU/RSV testing.  Fact Sheet for Patients: EntrepreneurPulse.com.au  Fact Sheet for Healthcare Providers: IncredibleEmployment.be  This test is not yet approved or cleared by the [place] FDA and has been authorized for detection and/or diagnosis of SARS-CoV-2 by FDA under an Emergency Use Authorization (EUA). This EUA will remain in effect (meaning this test can be used) for the duration of the COVID-19 declaration under Section 564(b)(1) of the Act, 21 U.S.C. section 360bbb-3(b)(1), unless the authorization is terminated or revoked.  Performed at [facility], [address], [place], [place] [address]     Procedures and diagnostic studies:  CT FOOT RIGHT W CONTRAST  Result Date: 12/28/2020 CLINICAL DATA:  Foot pain swelling blisters EXAM: CT OF THE LOWER RIGHT EXTREMITY WITH CONTRAST TECHNIQUE: Multidetector CT imaging of the lower right extremity was performed according to the standard protocol following intravenous contrast administration. CONTRAST:  78m OMNIPAQUE IOHEXOL 300 MG/ML  SOLN COMPARISON:   Radiograph 12/28/2020 FINDINGS: Bones/Joint/Cartilage No acute fracture or malalignment. No significant ankle effusion. No gross osseous destructive change. Moderate intertarsal degenerative changes with scattered small erosions. Degenerative changes with joint space narrowing at the MTP and IP joints. Small erosion base of first proximal phalanx. Ligaments Suboptimally assessed by CT. Muscles and Tendons No intramuscular fluid collections. Achilles tendon appears intact. Bulky plantar fascia calcifications. There is mild atrophy of the plantar muscles. Soft tissues Skin thickening and considerable soft tissue edema. Fluid at the distal plantar aspect of the foot without rim enhancing abscess collection. Surface irregularity at the plantar  aspect of the distal foot skin surface could relate to history of blisters. No soft tissue emphysema. IMPRESSION: 1. Considerable skin thickening and soft tissue edema, suspect for cellulitis. No rim enhancing fluid collections to suggest soft tissue abscess at this time. 2. No frank osseous destructive change to suggest osteomyelitis. Moderate degenerative changes are present at the tarsal bones, TMT and MTP joints as well as the digits of the foot. There are scattered erosions. Findings could be secondary to inflammatory arthritis though early changes of neuropathic joint disease could also be considered. If clinical suspicion for osteomyelitis remains high, would further evaluate with MRI. Electronically Signed   By: Donavan Foil M.D.   On: 12/28/2020 22:12   US ARTERIAL ABI (SCREENING LOWER EXTREMITY)  Result Date: 12/29/2020 CLINICAL DATA:  Right foot blisters for 2 days. Prior left trans metatarsal amputation. EXAM: NONINVASIVE PHYSIOLOGIC VASCULAR STUDY OF BILATERAL LOWER EXTREMITIES TECHNIQUE: Evaluation of both lower extremities were performed at rest, including calculation of ankle-brachial indices with single level Doppler, pressure and pulse volume recording.  COMPARISON:  None. FINDINGS: Right ABI:  1.19 Left ABI:  1.27 Right Lower Extremity: Posterior tibial pulse was not detected. Monophasic waveform seen in the dorsalis pedis. Left Lower Extremity: Posterior tibial pulse was not detected. Monophasic waveform seen in the dorsalis pedis. 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 Degree of arterial stenosis can be better evaluated with CT angiography. Electronically Signed   By: Miachel Roux M.D.   On: 12/29/2020 12:35   DG Foot Complete Right  Result Date: 12/28/2020 CLINICAL DATA:  Pain and swelling EXAM: RIGHT FOOT COMPLETE - 3+ VIEW COMPARISON:  03/28/2017 FINDINGS: No recent fracture or dislocation is seen. There are no focal lytic lesions. Degenerative changes are noted in first metatarsophalangeal joint and interphalangeal joint of the big toe. Bony spurs seen in the dorsal aspect of intertarsal and tarsometatarsal joints. Plantar spur is seen in the calcaneus. There are coarse calcifications in the region of plantar fascia. Arterial calcifications are seen in the soft tissues. There is marked soft tissue swelling over the dorsum. IMPRESSION: No recent fracture or dislocation is seen. There are no focal lytic lesions. If there is clinical suspicion for osteomyelitis, follow-up three-phase bone scan or MRI may be considered. Other findings as described in the body of the report. Electronically Signed   By: Elmer Picker M.D.   On: 12/28/2020 17:08    Medications:    heparin  5,000 Units Subcutaneous Q8H   insulin aspart  0-15 Units Subcutaneous Q4H   insulin detemir  25 Units Subcutaneous QHS   Continuous Infusions:  cefTRIAXone (ROCEPHIN)  IV Stopped (12/29/20 1805)   metronidazole 500 mg (12/30/20 6979)   vancomycin 1,250 mg (12/30/20 1019)      LOS: 2 days   Geradine Girt  [facility]   How to contact the [facility] Attending or Consulting provider [place] or covering provider during after hours [place], for this patient?  Check the care team in [facility] and look for a) attending/consulting [facility] provider listed and b) the [facility] team listed Log into [email or web address] and use [place]'s universal password to access. If you do not have the password, please contact the hospital operator. Locate the [facility] provider you are looking for under [facility] and page to a number that you can be directly reached.  If you still have difficulty reaching the provider, please page the [facility] (Director on Call) for the Hospitalists listed on amion for assistance.  12/30/2020, 11:17 AM

## 2020-12-30 NOTE — Progress Notes (Signed)
Pt picked up for transport by EMS to [facility].

## 2020-12-30 NOTE — Progress Notes (Signed)
Secure chat sent to Dr Eliseo Squires regarding pt arrival from [facility] for an ortho consult. Md states she will notify ortho of pt arrival

## 2020-12-31 DIAGNOSIS — I10 Essential (primary) hypertension: Secondary | ICD-10-CM

## 2020-12-31 DIAGNOSIS — E1165 Type 2 diabetes mellitus with hyperglycemia: Secondary | ICD-10-CM

## 2020-12-31 DIAGNOSIS — L97511 Non-pressure chronic ulcer of other part of right foot limited to breakdown of skin: Secondary | ICD-10-CM

## 2020-12-31 DIAGNOSIS — Z794 Long term (current) use of insulin: Secondary | ICD-10-CM

## 2020-12-31 DIAGNOSIS — L03115 Cellulitis of right lower limb: Secondary | ICD-10-CM

## 2020-12-31 DIAGNOSIS — E08621 Diabetes mellitus due to underlying condition with foot ulcer: Secondary | ICD-10-CM

## 2020-12-31 LAB — GLUCOSE, CAPILLARY
Glucose-Capillary: 197 mg/dL — ABNORMAL HIGH (ref 70–99)
Glucose-Capillary: 208 mg/dL — ABNORMAL HIGH (ref 70–99)
Glucose-Capillary: 214 mg/dL — ABNORMAL HIGH (ref 70–99)
Glucose-Capillary: 236 mg/dL — ABNORMAL HIGH (ref 70–99)
Glucose-Capillary: 238 mg/dL — ABNORMAL HIGH (ref 70–99)
Glucose-Capillary: 275 mg/dL — ABNORMAL HIGH (ref 70–99)

## 2020-12-31 MED ORDER — VANCOMYCIN HCL 1250 MG/250ML IV SOLN
1250.0000 mg | Freq: Once | INTRAVENOUS | Status: AC
  Administered 2020-12-31: 15:00:00 1250 mg via INTRAVENOUS
  Filled 2020-12-31: qty 250

## 2020-12-31 MED ORDER — INSULIN DETEMIR 100 UNIT/ML ~~LOC~~ SOLN
30.0000 [IU] | Freq: Every day | SUBCUTANEOUS | Status: DC
  Administered 2020-12-31: 23:00:00 30 [IU] via SUBCUTANEOUS
  Filled 2020-12-31 (×2): qty 0.3

## 2020-12-31 MED ORDER — VANCOMYCIN HCL 1500 MG/300ML IV SOLN
1500.0000 mg | Freq: Two times a day (BID) | INTRAVENOUS | Status: DC
  Filled 2020-12-31: qty 300

## 2020-12-31 MED ORDER — MELATONIN 5 MG PO TABS
5.0000 mg | ORAL_TABLET | Freq: Once | ORAL | Status: AC
  Administered 2020-12-31: 5 mg via ORAL
  Filled 2020-12-31: qty 1

## 2020-12-31 MED ORDER — VANCOMYCIN HCL 1500 MG/300ML IV SOLN: 1500.0000 mg | Freq: Two times a day (BID) | INTRAVENOUS | Status: DC

## 2020-12-31 MED ORDER — VANCOMYCIN HCL 1500 MG/300ML IV SOLN
1500.0000 mg | Freq: Two times a day (BID) | INTRAVENOUS | Status: AC
  Administered 2021-01-01 – 2021-01-04 (×8): 1500 mg via INTRAVENOUS
  Filled 2020-12-31 (×8): qty 300

## 2020-12-31 MED ORDER — INSULIN ASPART 100 UNIT/ML IJ SOLN
2.0000 [IU] | Freq: Three times a day (TID) | INTRAMUSCULAR | Status: DC
  Administered 2020-12-31 – 2021-01-02 (×5): 2 [IU] via SUBCUTANEOUS

## 2020-12-31 MED ORDER — MORPHINE SULFATE (PF) 2 MG/ML IV SOLN
1.0000 mg | INTRAVENOUS | Status: DC | PRN
  Administered 2020-12-31 – 2021-01-01 (×3): 2 mg via INTRAVENOUS
  Filled 2020-12-31 (×3): qty 1

## 2020-12-31 NOTE — Consult Note (Signed)
ORTHOPAEDIC CONSULTATION  REQUESTING PHYSICIAN: Hosie Poisson, MD  Chief Complaint: Ischemic ulcers plantar aspect all of the toes on the right foot.  HPI: [name] is a 65 y.o. male who presents with ischemic ulcers plantar aspect of the toes on the right foot.  Patient states that he took a shower got out of the shower and noticed the ulcers.  Patient denies any prolonged history.  He denies any burning of the toes.  Past Medical History:  Diagnosis Date   Arthritis    Back pain    Diabetes mellitus without complication ([place])    Diabetic foot ulcer ([place]) 03/2019   History of kidney stones    Hypertension    Past Surgical History:  Procedure Laterality Date   BACK SURGERY     CARPAL TUNNEL RELEASE     CHOLECYSTECTOMY     FOOT SURGERY     INTRAMEDULLARY (IM) NAIL INTERTROCHANTERIC Right 04/14/2020   Procedure: INTRAMEDULLARY (IM) NAIL INTERTROCHANTRIC;  Surgeon: [name], MD;  Location: [facility];  Service: Orthopedics;  Laterality: Right;   INTRAMEDULLARY (IM) NAIL INTERTROCHANTRIC (Right Leg Upper)  04/14/2020   JOINT REPLACEMENT     REVISION OF TRANSMETATARSAL AMPUTATION Right 05/26/2019   SHOULDER ARTHROSCOPY     STUMP REVISION Left 05/26/2019   Procedure: REVISION LEFT TRANSMETATARSAL AMPUTATION;  Surgeon: [name], MD;  Location: [facility];  Service: Orthopedics;  Laterality: Left;   TOTAL KNEE ARTHROPLASTY     TRANSMETATARSAL AMPUTATION Left 03/27/2019   Procedure: TRANSMETATARSAL AMPUTATION left foot;  Surgeon: [name], MD;  Location: [facility];  Service: Orthopedics;  Laterality: Left;   Social History   Socioeconomic History   Marital status: Single    Spouse name: Not on file   Number of children: Not on file   Years of education: Not on file   Highest education level: Not on file  Occupational History   Not on file  Tobacco Use   Smoking status: Never   Smokeless tobacco: Never  Vaping Use   Vaping Use: Never used  Substance and Sexual  Activity   Alcohol use: No   Drug use: No   Sexual activity: Not on file  Other Topics Concern   Not on file  Social History Narrative   Not on file   Social Determinants of Health   Financial Resource Strain: Not on file  Food Insecurity: Not on file  Transportation Needs: Not on file  Physical Activity: Not on file  Stress: Not on file  Social Connections: Not on file   Family History  Problem Relation Age of Onset   Heart failure Mother    Cancer Father    - negative except otherwise stated in the family history section Allergies  Allergen Reactions   Other Hives    Anti-snake venim for copperhead venom.   Pravastatin Other (See Comments)    Leg cramps, elevated CK   Prednisone Other (See Comments)    Patient states "it runs my blood sugar up so I don't take it."   Toradol [Ketorolac Tromethamine] Hives and Nausea And Vomiting    Reaction to injection   Prior to Admission medications   Medication Sig Start Date End Date Taking? Authorizing Provider  acetaminophen (TYLENOL) 500 MG tablet Take 500 mg by mouth every 6 (six) hours as needed for headache (pain).   Yes [provider]  allopurinol (ZYLOPRIM) 100 MG tablet TAKE 1 TABLET BY MOUTH TWICE A DAY Patient taking differently: Take  100 mg by mouth 2 (two) times daily as needed (gout attacks). 12/28/20  Yes [name], MD  amitriptyline (ELAVIL) 25 MG tablet Take 25 mg by mouth at bedtime as needed for sleep. 03/11/19  Yes [provider]  amLODipine (NORVASC) 10 MG tablet Take 10 mg by mouth at bedtime. 02/08/16  Yes [provider]  cyclobenzaprine (FLEXERIL) 10 MG tablet Take 10 mg by mouth at bedtime. 03/30/20  Yes [provider]  gabapentin (NEURONTIN) 300 MG capsule Take 300 mg by mouth 3 (three) times daily. 03/16/19  Yes [provider]  glipiZIDE (GLUCOTROL XL) 2.5 MG 24 hr tablet Take 2.5 mg by mouth every morning. 03/30/20  Yes [provider]  insulin  detemir (LEVEMIR) 100 UNIT/ML injection Inject 110-115 Units into the skin 2 (two) times daily as needed (CBG >110).   Yes [provider]  meloxicam (MOBIC) 15 MG tablet Take 15 mg by mouth at bedtime. 11/17/20  Yes [provider]  metoprolol succinate (TOPROL-XL) 100 MG 24 hr tablet Take 100 mg by mouth every morning. 02/04/19  Yes [provider]  oxyCODONE (ROXICODONE) 15 MG immediate release tablet Take 15 mg by mouth 4 (four) times daily.   Yes [provider]  aspirin EC 325 MG tablet Take 1 tablet (325 mg total) by mouth daily. Patient not taking: Reported on 12/30/2020 04/14/20   Persons, Bevely Palmer, PA  ferrous sulfate 325 (65 FE) MG tablet Take 1 tablet (325 mg total) by mouth daily. Patient not taking: Reported on 12/30/2020 04/18/20 04/18/21  Shelly Coss, MD  folic acid (FOLVITE) 1 MG tablet Take 1 tablet (1 mg total) by mouth daily. Patient not taking: Reported on 12/30/2020 04/18/20   Florencia Reasons, MD  Multiple Vitamin (MULTIVITAMIN WITH MINERALS) TABS tablet Take 1 tablet by mouth daily. Patient not taking: Reported on 12/30/2020 04/18/20   Florencia Reasons, MD  oxyCODONE 10 MG TABS Take 1 tablet (10 mg total) by mouth every 6 (six) hours as needed for severe pain or moderate pain. Patient not taking: Reported on 12/30/2020 04/20/20   Shelly Coss, MD  polyethylene glycol (MIRALAX / GLYCOLAX) 17 g packet Take 17 g by mouth daily as needed for mild constipation. Patient not taking: Reported on 12/30/2020 04/17/20   Florencia Reasons, MD  thiamine 100 MG tablet Take 1 tablet (100 mg total) by mouth daily. Patient not taking: Reported on 12/30/2020 04/18/20   Florencia Reasons, MD  vitamin B-12 1000 MCG tablet Take 1 tablet (1,000 mcg total) by mouth daily. Patient not taking: Reported on 12/30/2020 04/18/20   Florencia Reasons, MD   US ARTERIAL ABI (SCREENING LOWER EXTREMITY)  Result Date: 12/29/2020 CLINICAL DATA:  Right foot blisters for 2 days. Prior left trans metatarsal amputation. EXAM:  NONINVASIVE PHYSIOLOGIC VASCULAR STUDY OF BILATERAL LOWER EXTREMITIES TECHNIQUE: Evaluation of both lower extremities were performed at rest, including calculation of ankle-brachial indices with single level Doppler, pressure and pulse volume recording. COMPARISON:  None. FINDINGS: Right ABI:  1.19 Left ABI:  1.27 Right Lower Extremity: Posterior tibial pulse was not detected. Monophasic waveform seen in the dorsalis pedis. Left Lower Extremity: Posterior tibial pulse was not detected. Monophasic waveform seen in the dorsalis pedis. 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 Degree of arterial stenosis can be better evaluated with CT angiography. Electronically Signed   By:  Sharen Heck  Mir M.D.   On: 12/29/2020 12:35   - pertinent xrays, CT, MRI studies were reviewed and independently interpreted  Positive ROS: All other systems have been reviewed and were otherwise negative with the exception of those mentioned in the HPI and as above.  Physical Exam: General: Alert, no acute distress Psychiatric: Patient is competent for consent with normal mood and affect Lymphatic: No axillary or cervical lymphadenopathy Cardiovascular: No pedal edema Respiratory: No cyanosis, no use of accessory musculature GI: No organomegaly, abdomen is soft and non-tender    Images:  @ENCIMAGES @  Labs:  Lab Results  Component Value Date   HGBA1C 8.2 (H) 12/29/2020   HGBA1C 7.2 (H) 04/13/2020   HGBA1C 6.0 (H) 05/26/2019   ESRSEDRATE 44 (H) 12/28/2020   ESRSEDRATE 81 (H) 03/25/2019   CRP 3.7 (H) 12/28/2020   CRP 1.4 (H) 04/11/2019   CRP 1.9 (H) 03/25/2019   LABURIC 8.6 (H) 11/13/2020   LABURIC 7.0 05/11/2020   LABURIC 5.6 04/13/2019   REPTSTATUS PENDING 12/28/2020   REPTSTATUS PENDING 12/28/2020   GRAMSTAIN   03/27/2019    RARE WBC PRESENT,BOTH PMN AND MONONUCLEAR NO ORGANISMS SEEN    CULT  12/28/2020    NO GROWTH 2 DAYS Performed at [facility], [address] [place], [place] [address]    CULT  12/28/2020    NO GROWTH 2 DAYS Performed at [facility], [address], [place], [place] [address]    LABORGA ENTEROBACTER AEROGENES (A) 04/14/2020    Lab Results  Component Value Date   ALBUMIN 4.1 12/28/2020   ALBUMIN 2.9 (L) 04/13/2020   ALBUMIN 3.0 (L) 04/13/2020   PREALBUMIN 17.6 (L) 03/25/2019   LABURIC 8.6 (H) 11/13/2020   LABURIC 7.0 05/11/2020   LABURIC 5.6 04/13/2019     CBC EXTENDED Latest Ref Rng & Units 12/30/2020 12/29/2020 12/28/2020  WBC 4.0 - 10.5 K/uL 6.2 8.9 10.4  RBC 4.22 - 5.81 MIL/uL 3.36(L) 3.65(L) 4.00(L)  HGB 13.0 - 17.0 g/dL 10.9(L) 12.1(L) 13.3  HCT 39.0 - 52.0 % 31.2(L) 34.0(L) 37.6(L)  PLT 150 - 400 K/uL 132(L) 148(L) 214  NEUTROABS 1.7 - 7.7 K/uL - - -  LYMPHSABS 0.7 - 4.0 K/uL - - -    Neurologic: Patient does not have protective sensation bilateral lower extremities.   MUSCULOSKELETAL:   Skin: Examination patient has ischemic ulceration of the plantar aspect of all toes right foot.  There is blistering of the skin beneath the forefoot.  There is dermatitis and cellulitis of the right calf with venous insufficiency.  I cannot palpate a dorsalis pedis or posterior tibial pulse however ankle-brachial indices show triphasic flow at the ankle with elevated pressures consistent with calcified arteries.  Patient's hemoglobin is 10.9 white blood cell count 6.2.  Hemoglobin A1c 8.2.  Review of the CT scan does not show any destructive bony changes.  Patient has a stable left transmetatarsal amputation.  Assessment: Assessment: Uncontrolled type 2 diabetes with peripheral vascular disease with ischemic ulceration of the plantar aspect of the toes and metatarsal head of the right foot.  Plan: Plan: Discussed with the patient his best option would be  to proceed with a transmetatarsal amputation on the right consistent with the amputation on the left.  Patient states he is not ready to  consider surgery at this time I will discuss this with him tomorrow morning.  Thank you for the consult and the opportunity to see Mr. Nelton Amsden, [place] [phone] 9:19 AM

## 2020-12-31 NOTE — Progress Notes (Signed)
This rn offered to inventory pt personal belongings upon pt arrival to room. This rn performed inventory of pt oxycodone from home and home med sent to pharmacy. Pt refused inventory of monetary belongings. Pt male and male family member at bedside.

## 2020-12-31 NOTE — Consult Note (Signed)
[facility] Nurse Consult Note: Reason for Consult:Right foot wound.  WOC was consulted on Friday, 12/29/20 and recommendation made for Orthopedic/Podiatric/General Surgery consult.  See note from my associate, M. Austin. Patient is transferred to [facility] for evaluation by Dr. Eather Colas.  Currently there is no role for WOC Nursing.  [facility] nursing team will not follow, but will remain available to this patient, the nursing and medical teams.  Please re-consult if needed. Thanks, Maudie Flakes, MSN, RN, [place], Arther Abbott  Pager# [phone]

## 2020-12-31 NOTE — Progress Notes (Signed)
Pharmacy Antibiotic Note  [name] is a 65 y.o. male admitted on 12/28/2020 with cellulitis.  Pharmacy has been consulted for vancomycin dosing. Patient currently treated with empiric CTX, Flagyl, and Vancomycin. Current WBC wnl and Scr<1 (improved since admission). Ortho has been consulted and recommending transmetatarsal amputation on the right consistent with amputation on the left. Patient not ready to consider at this time, ortho will continued to follow.   Plan: Increase vancomycin to 1500 MG IV Q12H based on improved renal function (eAUC 502, Scr 0.93) Monitor renal fxn and clinical status F/u ortho plans  Levels as indicated   Height: 6' 2"  (188 cm) Weight: 100 kg (220 lb 7.4 oz) IBW/kg (Calculated) : 82.2  Temp (24hrs), Avg:98.7 F (37.1 C), Min:98 F (36.7 C), Max:99.5 F (37.5 C)  Recent Labs  Lab 12/28/20 1727 12/28/20 2033 12/29/20 0620 12/30/20 0443  WBC 10.4  --  8.9 6.2  CREATININE 1.20  --  1.03 0.93  LATICACIDVEN 2.5* 1.4  --   --      Estimated Creatinine Clearance: 102.7 mL/min (by C-G formula based on SCr of 0.93 mg/dL).    Allergies  Allergen Reactions   Other Hives    Anti-snake venim for copperhead venom.   Pravastatin Other (See Comments)    Leg cramps, elevated CK   Prednisone Other (See Comments)    Patient states "it runs my blood sugar up so I don't take it."   Toradol [Ketorolac Tromethamine] Hives and Nausea And Vomiting    Reaction to injection    Antimicrobials this admission: 12/15 vanc>> 12/15 ceftriaxone>> 12/15 flagyl>>  Dose adjustments this admission:   Microbiology results: 12/15 Bcx >> ngtd   Cephus Slater, PharmD, [facility] Pharmacy Resident [phone] 12/31/2020 10:15 AM

## 2020-12-31 NOTE — Progress Notes (Signed)
PROGRESS NOTE    [name]  PYK:[id number] DOB: 02/20/1955 DOA: 12/28/2020 PCP: [facility]   No chief complaint on file.   Brief Narrative:   [name] is an 65 y.o. male with medical history significant for diabetic foot ulcer, hypertension, [place], obesity, left foot ray amputation. Now presenting with right foot cellulitis/ulcer. He was transferred form [facility] to [facility] for evaluation by Dr Sharol Given.  Assessment & Plan:   Principal Problem:   Cellulitis of right foot Active Problems:   Uncontrolled type 2 diabetes mellitus with hyperglycemia, with long-term current use of insulin (HCC)   Essential hypertension, benign   NASH (nonalcoholic steatohepatitis)   Obesity   Diabetic foot ulcer (HCC)   Lactic acidosis   Cellulitis of right leg   Cellulitis of the right foot/ Diabetic foot ulcer:  Continue with broad spectrum IV antibiotics , appreciate Dr Sharol Given recommendations.  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 Degree of arterial stenosis can be better evaluated with CT angiography.  Will wait for the trans metatarsal amputation to be scheduled by Dr Sharol Given.  Pain control.  Follow blood cultures.   Uncontrolled DM with hyperglycemia Insulin dependent.  A1c is 8.2 Resume SSI.  Increase levemir to 30 units Daily. Add 2 units TIDAC.  CBG (last 3)  Recent Labs    12/31/20 0345 12/31/20 0731 12/31/20 1208  GLUCAP 236* 275* 208*      HYPOKALEMIA  Replaced.    Anemia of acute illness Monitor.  Transfuse to keep hemoglobin greater than 7.    Mild thrombocytopenia:  Continue to monitor.   Body mass index is 28.31 kg/m.  Lactic acidosis:  Resolved.    NASH; Monitor liver enzymes intermittently.    Hypertension:  Well controlled BP  parameters.         DVT prophylaxis: (Heparin) Code Status: (full code) Family Communication: None at bedside.  Disposition:   Status is: Inpatient  Remains inpatient appropriate because: IV antibiotics,diabetic foot ulcer.      Consultants:  ORTHOPEDICS.   Procedures: none.   Antimicrobials: Antibiotics Given (last 72 hours)     Date/Time Action Medication Dose Rate   12/28/20 1943 New Bag/Given   cefTRIAXone (ROCEPHIN) 2 g in sodium chloride 0.9 % 100 mL IVPB 2 g 200 mL/hr   12/28/20 2055 New Bag/Given   vancomycin (VANCOCIN) IVPB 1000 mg/200 mL premix 1,000 mg 200 mL/hr   12/28/20 2056 New Bag/Given   metroNIDAZOLE (FLAGYL) IVPB 500 mg 500 mg 100 mL/hr   12/28/20 2211 New Bag/Given   vancomycin (VANCOCIN) IVPB 1000 mg/200 mL premix 1,000 mg 200 mL/hr   12/29/20 5397 New Bag/Given   metroNIDAZOLE (FLAGYL) IVPB 500 mg 500 mg 100 mL/hr   12/29/20 1056 New Bag/Given   vancomycin (VANCOREADY) IVPB 1500 mg/300 mL 1,500 mg 150 mL/hr   12/29/20 1731 New Bag/Given   cefTRIAXone (ROCEPHIN) 2 g in sodium chloride 0.9 % 100 mL IVPB 2 g 200 mL/hr   12/29/20 2020 New Bag/Given   metroNIDAZOLE (FLAGYL) IVPB 500 mg 500 mg 100 mL/hr   12/29/20 2207 New Bag/Given   vancomycin (VANCOREADY) IVPB 1250 mg/250 mL 1,250 mg 166.7 mL/hr   12/30/20 0808 New Bag/Given   metroNIDAZOLE (FLAGYL) IVPB 500 mg 500 mg 100 mL/hr   12/30/20 1019 New Bag/Given   vancomycin (VANCOREADY) IVPB 1250 mg/250 mL 1,250 mg 166.7  mL/hr   12/30/20 1625 New Bag/Given   cefTRIAXone (ROCEPHIN) 2 g in sodium chloride 0.9 % 100 mL IVPB 2 g 200 mL/hr   12/30/20 2013 New Bag/Given   metroNIDAZOLE (FLAGYL) IVPB 500 mg 500 mg 100 mL/hr   12/30/20 2123 New Bag/Given   vancomycin (VANCOREADY) IVPB 1250 mg/250 mL 1,250 mg 166.7 mL/hr   12/31/20 1021 New Bag/Given   metroNIDAZOLE (FLAGYL) IVPB 500 mg 500 mg 100 mL/hr      \   Subjective: Pain is controlled, he said he will do the surgery.    Objective: Vitals:   12/31/20 0003 12/31/20 0004 12/31/20 0349 12/31/20 0851  BP: (!) 151/100 (!) 151/100 140/81 (!) 148/71  Pulse: (!) 103 (!) 103 99 88  Resp: 18 18 17 18   Temp: 98.8 F (37.1 C) 98.8 F (37.1 C) 98.7 F (37.1 C) 98 F (36.7 C)  TempSrc:    Oral  SpO2: 99% 97% 97% 100%  Weight:      Height:        Intake/Output Summary (Last 24 hours) at 12/31/2020 1434 Last data filed at 12/31/2020 0930 Gross per 24 hour  Intake 1070 ml  Output 2000 ml  Net -930 ml   Filed Weights   12/28/20 2044  Weight: 100 kg    Examination:  General exam: Appears calm and comfortable  Respiratory system: Clear to auscultation. Respiratory effort normal. Cardiovascular system: S1 & S2 heard, RRR. No JVD, No pedal edema. Gastrointestinal system: Abdomen is nondistended, soft and nontender.  Normal bowel sounds heard. Central nervous system: Alert and oriented. No focal neurological deficits. Extremities: right foot swollen with ulceration on the plantar aspect of the foot Skin: No rashes, lesions or ulcers Psychiatry: Mood & affect appropriate.     Data Reviewed: I have personally reviewed following labs and imaging studies  CBC: Recent Labs  Lab 12/28/20 1727 12/29/20 0620 12/30/20 0443  WBC 10.4 8.9 6.2  HGB 13.3 12.1* 10.9*  HCT 37.6* 34.0* 31.2*  MCV 94.0 93.2 92.9  PLT 214 148* 132*    Basic Metabolic Panel: Recent Labs  Lab 12/28/20 1727 12/29/20 0620 12/30/20 0443  NA 129* 134* 135  K 4.3 3.5 3.4*  CL 94* 100 101  CO2 25 26 25   GLUCOSE 408* 217* 171*  BUN 22 18 14   CREATININE 1.20 1.03 0.93  CALCIUM 8.8* 8.5* 8.4*    GFR: Estimated Creatinine Clearance: 102.7 mL/min (by C-G formula based on SCr of 0.93 mg/dL).  Liver Function Tests: Recent Labs  Lab 12/28/20 1727  AST 17  ALT 18  ALKPHOS 113  BILITOT 1.1  PROT 8.0  ALBUMIN 4.1    CBG: Recent Labs  Lab 12/30/20 2205 12/30/20 2357 12/31/20 0345 12/31/20 0731 12/31/20 1208   GLUCAP 306* 248* 236* 275* 208*     Recent Results (from the past 240 hour(s))  Blood culture (routine x 2)     Status: None (Preliminary result)   Collection Time: 12/28/20  5:27 PM   Specimen: BLOOD  Result Value Ref Range Status   Specimen Description BLOOD BLOOD RIGHT ARM  Final   Special Requests   Final    BOTTLES DRAWN AEROBIC AND ANAEROBIC Blood Culture adequate volume   Culture   Final    NO GROWTH 2 DAYS Performed at [facility], [address], [place], [place] [address]    Report Status PENDING  Incomplete  Blood culture (routine x 2)     Status: None (Preliminary result)  Collection Time: 12/28/20  5:27 PM   Specimen: BLOOD  Result Value Ref Range Status   Specimen Description BLOOD BLOOD LEFT ARM  Final   Special Requests   Final    BOTTLES DRAWN AEROBIC AND ANAEROBIC Blood Culture adequate volume   Culture   Final    NO GROWTH 2 DAYS Performed at [facility], [address], [place], [place] [address]    Report Status PENDING  Incomplete  Resp Panel by RT-PCR (Flu A&B, Covid) Nasopharyngeal Swab     Status: None   Collection Time: 12/28/20  7:18 PM   Specimen: Nasopharyngeal Swab; Nasopharyngeal(NP) swabs in vial transport medium  Result Value Ref Range Status   SARS Coronavirus 2 by RT PCR NEGATIVE NEGATIVE Final    Comment: (NOTE) SARS-CoV-2 target nucleic acids are NOT DETECTED.  The SARS-CoV-2 RNA is generally detectable in upper respiratory specimens during the acute phase of infection. The lowest concentration of SARS-CoV-2 viral copies this assay can detect is 138 copies/mL. A negative result does not preclude SARS-Cov-2 infection and should not be used as the sole basis for treatment or other patient management decisions. A negative result may occur with  improper specimen collection/handling, submission of specimen other than nasopharyngeal swab, presence of viral mutation(s) within the areas targeted by this assay, and inadequate number of  viral copies(<138 copies/mL). A negative result must be combined with clinical observations, patient history, and epidemiological information. The expected result is Negative.  Fact Sheet for Patients:  EntrepreneurPulse.com.au  Fact Sheet for Healthcare Providers:  IncredibleEmployment.be  This test is no t yet approved or cleared by the [place] FDA and  has been authorized for detection and/or diagnosis of SARS-CoV-2 by FDA under an Emergency Use Authorization (EUA). This EUA will remain  in effect (meaning this test can be used) for the duration of the COVID-19 declaration under Section 564(b)(1) of the Act, 21 U.S.C.section 360bbb-3(b)(1), unless the authorization is terminated  or revoked sooner.       Influenza A by PCR NEGATIVE NEGATIVE Final   Influenza B by PCR NEGATIVE NEGATIVE Final    Comment: (NOTE) The Xpert Xpress SARS-CoV-2/FLU/RSV plus assay is intended as an aid in the diagnosis of influenza from Nasopharyngeal swab specimens and should not be used as a sole basis for treatment. Nasal washings and aspirates are unacceptable for Xpert Xpress SARS-CoV-2/FLU/RSV testing.  Fact Sheet for Patients: EntrepreneurPulse.com.au  Fact Sheet for Healthcare Providers: IncredibleEmployment.be  This test is not yet approved or cleared by the [place] FDA and has been authorized for detection and/or diagnosis of SARS-CoV-2 by FDA under an Emergency Use Authorization (EUA). This EUA will remain in effect (meaning this test can be used) for the duration of the COVID-19 declaration under Section 564(b)(1) of the Act, 21 U.S.C. section 360bbb-3(b)(1), unless the authorization is terminated or revoked.  Performed at [facility], [address], [place], [place] [address]          Radiology Studies: No results found.      Scheduled Meds:  heparin  5,000 Units Subcutaneous Q8H    insulin aspart  0-15 Units Subcutaneous Q4H   insulin detemir  25 Units Subcutaneous QHS   Continuous Infusions:  cefTRIAXone (ROCEPHIN)  IV 2 g (12/30/20 1625)   metronidazole 500 mg (12/31/20 1021)   vancomycin       LOS: 3 days       Hosie Poisson, MD [facility]   To contact the attending provider between 7A-7P or  the covering provider during after hours 7P-7A, please log into the web site [email or web address] and access using universal [place] password for that web site. If you do not have the password, please call the hospital operator.  12/31/2020, 2:34 PM

## 2021-01-01 LAB — GLUCOSE, CAPILLARY
Glucose-Capillary: 187 mg/dL — ABNORMAL HIGH (ref 70–99)
Glucose-Capillary: 193 mg/dL — ABNORMAL HIGH (ref 70–99)
Glucose-Capillary: 214 mg/dL — ABNORMAL HIGH (ref 70–99)
Glucose-Capillary: 248 mg/dL — ABNORMAL HIGH (ref 70–99)
Glucose-Capillary: 234 mg/dL — ABNORMAL HIGH (ref 70–99)

## 2021-01-01 LAB — CBC
HCT: 34.6 % — ABNORMAL LOW (ref 39.0–52.0)
Hemoglobin: 12.8 g/dL — ABNORMAL LOW (ref 13.0–17.0)
MCH: 33.5 pg (ref 26.0–34.0)
MCHC: 37 g/dL — ABNORMAL HIGH (ref 30.0–36.0)
MCV: 90.6 fL (ref 80.0–100.0)
Platelets: 181 10*3/uL (ref 150–400)
RBC: 3.82 MIL/uL — ABNORMAL LOW (ref 4.22–5.81)
RDW: 12.9 % (ref 11.5–15.5)
WBC: 7.3 10*3/uL (ref 4.0–10.5)
nRBC: 0 % (ref 0.0–0.2)

## 2021-01-01 LAB — BASIC METABOLIC PANEL
Anion gap: 7 (ref 5–15)
BUN: 9 mg/dL (ref 8–23)
CO2: 26 mmol/L (ref 22–32)
Calcium: 8.7 mg/dL — ABNORMAL LOW (ref 8.9–10.3)
Chloride: 99 mmol/L (ref 98–111)
Creatinine, Ser: 1.06 mg/dL (ref 0.61–1.24)
GFR, Estimated: >60 mL/min (ref >60)
Glucose, Bld: 205 mg/dL — ABNORMAL HIGH (ref 70–99)
Potassium: 3.9 mmol/L (ref 3.5–5.1)
Sodium: 132 mmol/L — ABNORMAL LOW (ref 135–145)

## 2021-01-01 LAB — URIC ACID: Uric Acid, Serum: 6.8 mg/dL (ref 3.7–8.6)

## 2021-01-01 MED ORDER — METHOCARBAMOL 500 MG PO TABS
500.0000 mg | ORAL_TABLET | Freq: Four times a day (QID) | ORAL | Status: DC | PRN
  Administered 2021-01-01 – 2021-01-05 (×10): 500 mg via ORAL
  Filled 2021-01-01 (×10): qty 1

## 2021-01-01 MED ORDER — INSULIN DETEMIR 100 UNIT/ML ~~LOC~~ SOLN
34.0000 [IU] | Freq: Every day | SUBCUTANEOUS | Status: DC
  Administered 2021-01-01: 23:00:00 34 [IU] via SUBCUTANEOUS
  Filled 2021-01-01 (×3): qty 0.34

## 2021-01-01 MED ORDER — COLCHICINE 0.6 MG PO TABS
0.6000 mg | ORAL_TABLET | Freq: Every day | ORAL | Status: DC
  Administered 2021-01-01 – 2021-01-02 (×2): 0.6 mg via ORAL
  Filled 2021-01-01 (×3): qty 1

## 2021-01-01 MED ORDER — HYDROMORPHONE HCL 1 MG/ML IJ SOLN
0.5000 mg | INTRAMUSCULAR | Status: DC | PRN
  Administered 2021-01-01 – 2021-01-03 (×7): 0.5 mg via INTRAVENOUS
  Filled 2021-01-01 (×7): qty 0.5

## 2021-01-01 NOTE — Progress Notes (Addendum)
Patient ID: [name], male   DOB: [birth year], 65 y.o.   MRN: [id number] Patient complains of gout in the right elbow this morning.  He states he does have a history of gout and has taken colchicine and allopurinol in the past.  Examination of the right lower extremity cellulitis in the foot and calf is improving.  No change in the ischemic changes to the toes.  We will plan for a transmetatarsal amputation on Wednesday right foot.  I will start him on colchicine 1 a day and draw a uric acid.  Last uric acid 8.6

## 2021-01-01 NOTE — Progress Notes (Signed)
PROGRESS NOTE    Tal Kempker  VZD:[id number] DOB: 01/01/1956 DOA: 12/28/2020 PCP: [facility]   No chief complaint on file.   Brief Narrative:   [name] is an 65 y.o. male with medical history significant for diabetic foot ulcer, hypertension, Nash, obesity, left foot ray amputation. Now presenting with right foot cellulitis/ulcer. He was transferred form [facility] to [facility] for evaluation by Dr Sharol Given.  Assessment & Plan:   Principal Problem:   Cellulitis of right foot Active Problems:   Uncontrolled type 2 diabetes mellitus with hyperglycemia, with long-term current use of insulin (HCC)   Essential hypertension, benign   NASH (nonalcoholic steatohepatitis)   Obesity   Diabetic foot ulcer (HCC)   Lactic acidosis   Cellulitis of right leg   Cellulitis of the right foot/ Diabetic foot ulcer:  Continue with broad spectrum IV antibiotics , appreciate Dr Sharol Given recommendations.  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 Degree of arterial stenosis can be better evaluated with CT angiography.  Transmetatarsal amputation of the right foot scheduled on Wednesday by Dr Sharol Given. Worsening pain and muscle spasms, will add robaxin and change morphine to dilaudid.  Pain control.  Follow blood cultures.   Uncontrolled DM with hyperglycemia Insulin dependent.  A1c is 8.2 Resume SSI.  Increase levemir to 34 units Daily. Add 2 units TIDAC.  CBG (last 3)  Recent Labs    01/01/21 0401 01/01/21 0731 01/01/21 1113  GLUCAP 187* 193* 214*      Hypokalemia: replaced.    Anemia of acute illness Monitor.  Transfuse to keep hemoglobin greater than 7.    Mild thrombocytopenia:  resolved Continue to monitor.   Body mass index is 28.31 kg/m.  Lactic acidosis:  Resolved.     NASH; Monitor liver enzymes intermittently.    Hypertension:  BP parameters are optimal.     DVT prophylaxis: (Heparin) Code Status: (full code) Family Communication: None at bedside.  Disposition:   Status is: Inpatient  Remains inpatient appropriate because: IV antibiotics,diabetic foot ulcer.      Consultants:  ORTHOPEDICS.   Procedures: none.   Antimicrobials: Antibiotics Given (last 72 hours)     Date/Time Action Medication Dose Rate   12/29/20 1731 New Bag/Given   cefTRIAXone (ROCEPHIN) 2 g in sodium chloride 0.9 % 100 mL IVPB 2 g 200 mL/hr   12/29/20 2020 New Bag/Given   metroNIDAZOLE (FLAGYL) IVPB 500 mg 500 mg 100 mL/hr   12/29/20 2207 New Bag/Given   vancomycin (VANCOREADY) IVPB 1250 mg/250 mL 1,250 mg 166.7 mL/hr   12/30/20 2951 New Bag/Given   metroNIDAZOLE (FLAGYL) IVPB 500 mg 500 mg 100 mL/hr   12/30/20 1019 New Bag/Given   vancomycin (VANCOREADY) IVPB 1250 mg/250 mL 1,250 mg 166.7 mL/hr   12/30/20 1625 New Bag/Given   cefTRIAXone (ROCEPHIN) 2 g in sodium chloride 0.9 % 100 mL IVPB 2 g 200 mL/hr   12/30/20 2013 New Bag/Given   metroNIDAZOLE (FLAGYL) IVPB 500 mg 500 mg 100 mL/hr   12/30/20 2123 New Bag/Given   vancomycin (VANCOREADY) IVPB 1250 mg/250 mL 1,250 mg 166.7 mL/hr   12/31/20 1021 New Bag/Given   metroNIDAZOLE (FLAGYL) IVPB 500 mg 500 mg 100 mL/hr   12/31/20 1500 New Bag/Given   vancomycin (VANCOREADY) IVPB 1250 mg/250 mL 1,250 mg 166.7 mL/hr   12/31/20 1809 New Bag/Given   cefTRIAXone (ROCEPHIN) 2  g in sodium chloride 0.9 % 100 mL IVPB 2 g 200 mL/hr   12/31/20 2052 New Bag/Given   metroNIDAZOLE (FLAGYL) IVPB 500 mg 500 mg 100 mL/hr   01/01/21 0455 New Bag/Given   vancomycin (VANCOREADY) IVPB 1500 mg/300 mL 1,500 mg 150 mL/hr   01/01/21 0815 New Bag/Given   metroNIDAZOLE (FLAGYL) IVPB 500 mg 500 mg 100 mL/hr      \   Subjective: Pain not well controlled.   Objective: Vitals:   12/31/20 1618 12/31/20 2032 01/01/21 0435  01/01/21 0731  BP: 140/64 (!) 154/86 (!) 152/86 (!) 159/84  Pulse: 89 90 92 89  Resp: 18 17 18 18   Temp: 98.4 F (36.9 C) 99.1 F (37.3 C) 99.1 F (37.3 C) 98.2 F (36.8 C)  TempSrc: Oral Oral Oral Oral  SpO2: 99% 100% 100% 98%  Weight:      Height:        Intake/Output Summary (Last 24 hours) at 01/01/2021 1234 Last data filed at 01/01/2021 1030 Gross per 24 hour  Intake 639.94 ml  Output 4500 ml  Net -3860.06 ml    Filed Weights   12/28/20 2044  Weight: 100 kg    Examination:  General exam: Appears calm and comfortable  Respiratory system: Clear to auscultation. Respiratory effort normal. Cardiovascular system: S1 & S2 heard, RRR. No JVD, right foot swelling.  Gastrointestinal system: Abdomen is nondistended, soft and nontender. Normal bowel sounds heard. Central nervous system: Alert and oriented. No focal neurological deficits. Extremities: right foot swollen with an ulcer on the plantar aspect.  Skin: right foot ulcer Psychiatry:  Mood & affect appropriate.      Data Reviewed: I have personally reviewed following labs and imaging studies  CBC: Recent Labs  Lab 12/28/20 1727 12/29/20 0620 12/30/20 0443 01/01/21 0501  WBC 10.4 8.9 6.2 7.3  HGB 13.3 12.1* 10.9* 12.8*  HCT 37.6* 34.0* 31.2* 34.6*  MCV 94.0 93.2 92.9 90.6  PLT 214 148* 132* 181     Basic Metabolic Panel: Recent Labs  Lab 12/28/20 1727 12/29/20 0620 12/30/20 0443 01/01/21 0501  NA 129* 134* 135 132*  K 4.3 3.5 3.4* 3.9  CL 94* 100 101 99  CO2 25 26 25 26   GLUCOSE 408* 217* 171* 205*  BUN 22 18 14 9   CREATININE 1.20 1.03 0.93 1.06  CALCIUM 8.8* 8.5* 8.4* 8.7*     GFR: Estimated Creatinine Clearance: 90.1 mL/min (by C-G formula based on SCr of 1.06 mg/dL).  Liver Function Tests: Recent Labs  Lab 12/28/20 1727  AST 17  ALT 18  ALKPHOS 113  BILITOT 1.1  PROT 8.0  ALBUMIN 4.1     CBG: Recent Labs  Lab 12/31/20 2027 12/31/20 2340 01/01/21 0401 01/01/21 0731  01/01/21 1113  GLUCAP 238* 197* 187* 193* 214*      Recent Results (from the past 240 hour(s))  Blood culture (routine x 2)     Status: None (Preliminary result)   Collection Time: 12/28/20  5:27 PM   Specimen: BLOOD  Result Value Ref Range Status   Specimen Description BLOOD BLOOD RIGHT ARM  Final   Special Requests   Final    BOTTLES DRAWN AEROBIC AND ANAEROBIC Blood Culture adequate volume   Culture   Final    NO GROWTH 4 DAYS Performed at [facility], [address] [place], [place] [address]    Report Status PENDING  Incomplete  Blood culture (routine x 2)     Status: None (Preliminary result)  Collection Time: 12/28/20  5:27 PM   Specimen: BLOOD  Result Value Ref Range Status   Specimen Description BLOOD BLOOD LEFT ARM  Final   Special Requests   Final    BOTTLES DRAWN AEROBIC AND ANAEROBIC Blood Culture adequate volume   Culture   Final    NO GROWTH 4 DAYS Performed at [facility], [address], [place], [place] [address]    Report Status PENDING  Incomplete  Resp Panel by RT-PCR (Flu A&B, Covid) Nasopharyngeal Swab     Status: None   Collection Time: 12/28/20  7:18 PM   Specimen: Nasopharyngeal Swab; Nasopharyngeal(NP) swabs in vial transport medium  Result Value Ref Range Status   SARS Coronavirus 2 by RT PCR NEGATIVE NEGATIVE Final    Comment: (NOTE) SARS-CoV-2 target nucleic acids are NOT DETECTED.  The SARS-CoV-2 RNA is generally detectable in upper respiratory specimens during the acute phase of infection. The lowest concentration of SARS-CoV-2 viral copies this assay can detect is 138 copies/mL. A negative result does not preclude SARS-Cov-2 infection and should not be used as the sole basis for treatment or other patient management decisions. A negative result may occur with  improper specimen collection/handling, submission of specimen other than nasopharyngeal swab, presence of viral mutation(s) within the areas targeted by this assay, and  inadequate number of viral copies(<138 copies/mL). A negative result must be combined with clinical observations, patient history, and epidemiological information. The expected result is Negative.  Fact Sheet for Patients:  EntrepreneurPulse.com.au  Fact Sheet for Healthcare Providers:  IncredibleEmployment.be  This test is no t yet approved or cleared by the [place] FDA and  has been authorized for detection and/or diagnosis of SARS-CoV-2 by FDA under an Emergency Use Authorization (EUA). This EUA will remain  in effect (meaning this test can be used) for the duration of the COVID-19 declaration under Section 564(b)(1) of the Act, 21 U.S.C.section 360bbb-3(b)(1), unless the authorization is terminated  or revoked sooner.       Influenza A by PCR NEGATIVE NEGATIVE Final   Influenza B by PCR NEGATIVE NEGATIVE Final    Comment: (NOTE) The Xpert Xpress SARS-CoV-2/FLU/RSV plus assay is intended as an aid in the diagnosis of influenza from Nasopharyngeal swab specimens and should not be used as a sole basis for treatment. Nasal washings and aspirates are unacceptable for Xpert Xpress SARS-CoV-2/FLU/RSV testing.  Fact Sheet for Patients: EntrepreneurPulse.com.au  Fact Sheet for Healthcare Providers: IncredibleEmployment.be  This test is not yet approved or cleared by the [place] FDA and has been authorized for detection and/or diagnosis of SARS-CoV-2 by FDA under an Emergency Use Authorization (EUA). This EUA will remain in effect (meaning this test can be used) for the duration of the COVID-19 declaration under Section 564(b)(1) of the Act, 21 U.S.C. section 360bbb-3(b)(1), unless the authorization is terminated or revoked.  Performed at [facility], [address], [place], [place] [address]           Radiology Studies: No results found.      Scheduled Meds:  colchicine  0.6 mg  Oral Daily   heparin  5,000 Units Subcutaneous Q8H   insulin aspart  0-15 Units Subcutaneous Q4H   insulin aspart  2 Units Subcutaneous TID WC   insulin detemir  30 Units Subcutaneous QHS   Continuous Infusions:  cefTRIAXone (ROCEPHIN)  IV Stopped (12/31/20 1840)   metronidazole 500 mg (01/01/21 0815)   vancomycin 1,500 mg (01/01/21 0455)     LOS: 4 days  Hosie Poisson, MD [facility]   To contact the attending provider between 7A-7P or the covering provider during after hours 7P-7A, please log into the web site [email or web address] and access using universal [place] password for that web site. If you do not have the password, please call the hospital operator.  01/01/2021, 12:34 PM

## 2021-01-01 NOTE — H&P (View-Only) (Signed)
Patient ID: [name], male   DOB: Sep 27, 1955, 65 y.o.   MRN: [id number] Patient complains of gout in the right elbow this morning.  He states he does have a history of gout and has taken colchicine and allopurinol in the past.  Examination of the right lower extremity cellulitis in the foot and calf is improving.  No change in the ischemic changes to the toes.  We will plan for a transmetatarsal amputation on Wednesday right foot.  I will start him on colchicine 1 a day and draw a uric acid.  Last uric acid 8.6

## 2021-01-02 LAB — GLUCOSE, CAPILLARY
Glucose-Capillary: 197 mg/dL — ABNORMAL HIGH (ref 70–99)
Glucose-Capillary: 224 mg/dL — ABNORMAL HIGH (ref 70–99)
Glucose-Capillary: 233 mg/dL — ABNORMAL HIGH (ref 70–99)
Glucose-Capillary: 235 mg/dL — ABNORMAL HIGH (ref 70–99)
Glucose-Capillary: 237 mg/dL — ABNORMAL HIGH (ref 70–99)
Glucose-Capillary: 237 mg/dL — ABNORMAL HIGH (ref 70–99)

## 2021-01-02 LAB — MRSA NEXT GEN BY PCR, NASAL: MRSA by PCR Next Gen: NOT DETECTED

## 2021-01-02 MED ORDER — INSULIN DETEMIR 100 UNIT/ML ~~LOC~~ SOLN
38.0000 [IU] | Freq: Every day | SUBCUTANEOUS | Status: DC
  Administered 2021-01-02: 22:00:00 38 [IU] via SUBCUTANEOUS
  Filled 2021-01-02 (×2): qty 0.38

## 2021-01-02 MED ORDER — INSULIN ASPART 100 UNIT/ML IJ SOLN
3.0000 [IU] | Freq: Three times a day (TID) | INTRAMUSCULAR | Status: DC
  Administered 2021-01-02: 17:00:00 3 [IU] via SUBCUTANEOUS

## 2021-01-02 MED ORDER — HYDRALAZINE HCL 25 MG PO TABS: 25.0000 mg | ORAL_TABLET | Freq: Three times a day (TID) | ORAL | Status: DC | PRN

## 2021-01-02 MED ORDER — POVIDONE-IODINE 10 % EX SWAB
2.0000 "application " | Freq: Once | CUTANEOUS | Status: AC
  Administered 2021-01-03: 2 via TOPICAL

## 2021-01-02 MED ORDER — CEFAZOLIN SODIUM-DEXTROSE 2-4 GM/100ML-% IV SOLN
2.0000 g | INTRAVENOUS | Status: AC
  Administered 2021-01-03: 09:00:00 2 g via INTRAVENOUS
  Filled 2021-01-02 (×2): qty 100

## 2021-01-02 MED ORDER — CHLORHEXIDINE GLUCONATE 4 % EX LIQD
60.0000 mL | Freq: Once | CUTANEOUS | Status: AC
  Administered 2021-01-03: 4 via TOPICAL
  Filled 2021-01-02: qty 60

## 2021-01-02 NOTE — Plan of Care (Signed)
Pt has been receiving pain medication every 2-3 hours. Pt goes to sleep after pain medication. Vitals stable. Pt reluctant to turn to change linen. Complaint of right wrist and arm pain stating gout flare up. Pt due for surgery 12/21.  Problem: Education: Goal: Knowledge of General Education information will improve Description: Including pain rating scale, medication(s)/side effects and non-pharmacologic comfort measures Outcome: Progressing   Problem: Health Behavior/Discharge Planning: Goal: Ability to manage health-related needs will improve Outcome: Progressing   Problem: Clinical Measurements: Goal: Ability to maintain clinical measurements within normal limits will improve Outcome: Progressing Goal: Will remain free from infection Outcome: Progressing Goal: Diagnostic test results will improve Outcome: Progressing Goal: Respiratory complications will improve Outcome: Progressing Goal: Cardiovascular complication will be avoided Outcome: Progressing   Problem: Activity: Goal: Risk for activity intolerance will decrease Outcome: Progressing   Problem: Nutrition: Goal: Adequate nutrition will be maintained Outcome: Progressing   Problem: Coping: Goal: Level of anxiety will decrease Outcome: Progressing   Problem: Elimination: Goal: Will not experience complications related to bowel motility Outcome: Progressing Goal: Will not experience complications related to urinary retention Outcome: Progressing   Problem: Pain Managment: Goal: General experience of comfort will improve Outcome: Progressing   Problem: Safety: Goal: Ability to remain free from injury will improve Outcome: Progressing   Problem: Skin Integrity: Goal: Risk for impaired skin integrity will decrease Outcome: Progressing

## 2021-01-02 NOTE — Progress Notes (Signed)
PROGRESS NOTE    [name]  MHD:[id number] DOB: April 13, 1955 DOA: 12/28/2020 PCP: [facility]   No chief complaint on file.   Brief Narrative:   [name] is an 65 y.o. male with medical history significant for diabetic foot ulcer, hypertension, Nash, obesity, left foot ray amputation. Now presenting with right foot cellulitis/ulcer. [facility] was transferred [facility] to [facility] for evaluation by Dr Sharol Given.  Pt is scheduled for transtibial amputation tomorrow by Dr Sharol Given.   Assessment & Plan:   Principal Problem:   Cellulitis of right foot Active Problems:   Uncontrolled type 2 diabetes mellitus with hyperglycemia, with long-term current use of insulin (HCC)   Essential hypertension, benign   NASH (nonalcoholic steatohepatitis)   Obesity   Diabetic foot ulcer (HCC)   Lactic acidosis   Cellulitis of right leg   Cellulitis of the right foot/ Diabetic foot ulcer:  Continue with broad spectrum IV antibiotics , appreciate Dr Sharol Given recommendations.  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 Degree of arterial stenosis can be better evaluated with CT angiography.  Transmetatarsal amputation of the right foot scheduled on Wednesday by Dr Sharol Given. Worsening pain and muscle spasms, will add robaxin and change morphine to dilaudid.  Pain control.  Blood cultures have been negative so far.   Uncontrolled DM with hyperglycemia Insulin dependent.  A1c is 8.2 Resume SSI.  Increase levemir to 38 units, increase novolog to 3 units tidac.  CBG (last 3)  Recent Labs    01/02/21 0408 01/02/21 0746 01/02/21 1135  GLUCAP 197* 224* 237*      Hypokalemia: replaced.    Anemia of acute illness Monitor.  Transfuse to keep hemoglobin greater than 7.    Mild thrombocytopenia:   resolved Continue to monitor.   Body mass index is 28.31 kg/m.  Lactic acidosis:  Resolved.    NASH; Monitor liver enzymes intermittently.    Hypertension:  BP parameters are  sub optimally controlled. Added hydralazine 25 mg TID PRN.     DVT prophylaxis: (Heparin) Code Status: (full code) Family Communication: None at bedside.  Disposition:   Status is: Inpatient  Remains inpatient appropriate because: IV antibiotics,diabetic foot ulcer.      Consultants:  ORTHOPEDICS.   Procedures: none.   Antimicrobials: Antibiotics Given (last 72 hours)     Date/Time Action Medication Dose Rate   12/30/20 1625 New Bag/Given   cefTRIAXone (ROCEPHIN) 2 g in sodium chloride 0.9 % 100 mL IVPB 2 g 200 mL/hr   12/30/20 2013 New Bag/Given   metroNIDAZOLE (FLAGYL) IVPB 500 mg 500 mg 100 mL/hr   12/30/20 2123 New Bag/Given   vancomycin (VANCOREADY) IVPB 1250 mg/250 mL 1,250 mg 166.7 mL/hr   12/31/20 1021 New Bag/Given   metroNIDAZOLE (FLAGYL) IVPB 500 mg 500 mg 100 mL/hr   12/31/20 1500 New Bag/Given   vancomycin (VANCOREADY) IVPB 1250 mg/250 mL 1,250 mg 166.7 mL/hr   12/31/20 1809 New Bag/Given   cefTRIAXone (ROCEPHIN) 2 g in sodium chloride 0.9 % 100 mL IVPB 2 g 200 mL/hr   12/31/20 2052 New Bag/Given   metroNIDAZOLE (FLAGYL) IVPB 500 mg 500 mg 100 mL/hr   01/01/21 0455 New Bag/Given   vancomycin (VANCOREADY) IVPB 1500 mg/300 mL 1,500 mg 150 mL/hr   01/01/21 0815 New Bag/Given   metroNIDAZOLE (FLAGYL) IVPB 500 mg 500 mg 100 mL/hr   01/01/21  1612 New Bag/Given   cefTRIAXone (ROCEPHIN) 2 g in sodium chloride 0.9 % 100 mL IVPB 2 g 200 mL/hr   01/01/21 1732 New Bag/Given   vancomycin (VANCOREADY) IVPB 1500 mg/300 mL 1,500 mg 150 mL/hr   01/01/21 2300 New Bag/Given   metroNIDAZOLE (FLAGYL) IVPB 500 mg 500 mg 100 mL/hr   01/02/21 0446 New Bag/Given   vancomycin (VANCOREADY) IVPB 1500 mg/300 mL 1,500 mg 150 mL/hr   01/02/21 0858 New Bag/Given   metroNIDAZOLE (FLAGYL) IVPB 500  mg 500 mg 100 mL/hr      \   Subjective: No chest pain or sob.   Objective: Vitals:   01/01/21 1607 01/01/21 2004 01/02/21 0412 01/02/21 0746  BP: 135/87 (!) 150/92 (!) 160/93 (!) 149/83  Pulse: 99 (!) 104 99 98  Resp: 17 18 17 18   Temp: 98.2 F (36.8 C) 98.7 F (37.1 C) 97.8 F (36.6 C) 98.4 F (36.9 C)  TempSrc: Oral Oral  Oral  SpO2: 98% 97% 97% 97%  Weight:      Height:        Intake/Output Summary (Last 24 hours) at 01/02/2021 1303 Last data filed at 01/02/2021 0900 Gross per 24 hour  Intake 2382.05 ml  Output 3925 ml  Net -1542.95 ml    Filed Weights   12/28/20 2044  Weight: 100 kg    Examination:  General exam: Appears calm and comfortable  Respiratory system: Clear to auscultation. Respiratory effort normal. Cardiovascular system: S1 & S2 heard, RRR. No JVD,. Gastrointestinal system: Abdomen is nondistended, soft and nontender.  Normal bowel sounds heard. Central nervous system: Alert and oriented. No focal neurological deficits. Extremities: RIGHT FOOT ULCER on the plantar aspect. Right foot cellulitis.  Skin: No rashes, lesions or ulcers Psychiatry:  Mood & affect appropriate.       Data Reviewed: I have personally reviewed following labs and imaging studies  CBC: Recent Labs  Lab 12/28/20 1727 12/29/20 0620 12/30/20 0443 01/01/21 0501  WBC 10.4 8.9 6.2 7.3  HGB 13.3 12.1* 10.9* 12.8*  HCT 37.6* 34.0* 31.2* 34.6*  MCV 94.0 93.2 92.9 90.6  PLT 214 148* 132* 181     Basic Metabolic Panel: Recent Labs  Lab 12/28/20 1727 12/29/20 0620 12/30/20 0443 01/01/21 0501  NA 129* 134* 135 132*  K 4.3 3.5 3.4* 3.9  CL 94* 100 101 99  CO2 25 26 25 26   GLUCOSE 408* 217* 171* 205*  BUN 22 18 14 9   CREATININE 1.20 1.03 0.93 1.06  CALCIUM 8.8* 8.5* 8.4* 8.7*     GFR: Estimated Creatinine Clearance: 90.1 mL/min (by C-G formula based on SCr of 1.06 mg/dL).  Liver Function Tests: Recent Labs  Lab 12/28/20 1727  AST 17  ALT 18   ALKPHOS 113  BILITOT 1.1  PROT 8.0  ALBUMIN 4.1     CBG: Recent Labs  Lab 01/01/21 2005 01/02/21 0011 01/02/21 0408 01/02/21 0746 01/02/21 1135  GLUCAP 234* 235* 197* 224* 237*      Recent Results (from the past 240 hour(s))  Blood culture (routine x 2)     Status: None (Preliminary result)   Collection Time: 12/28/20  5:27 PM   Specimen: BLOOD  Result Value Ref Range Status   Specimen Description BLOOD BLOOD RIGHT ARM  Final   Special Requests   Final    BOTTLES DRAWN AEROBIC AND ANAEROBIC Blood Culture adequate volume   Culture   Final    NO GROWTH 4 DAYS Performed at [facility], [address], [place], [place] [address]    Report Status PENDING  Incomplete  Blood culture (routine x 2)     Status: None (Preliminary result)   Collection Time: 12/28/20  5:27 PM   Specimen: BLOOD  Result Value Ref Range Status   Specimen Description BLOOD BLOOD LEFT ARM  Final   Special Requests   Final    BOTTLES DRAWN AEROBIC AND ANAEROBIC Blood Culture adequate volume   Culture   Final    NO GROWTH 4 DAYS Performed at [facility], [address], [place], [place] [address]    Report Status PENDING  Incomplete  Resp Panel by RT-PCR (Flu A&B, Covid) Nasopharyngeal Swab     Status: None   Collection Time: 12/28/20  7:18 PM   Specimen: Nasopharyngeal Swab; Nasopharyngeal(NP) swabs in vial transport medium  Result Value Ref Range Status   SARS Coronavirus 2 by RT PCR NEGATIVE NEGATIVE Final    Comment: (NOTE) SARS-CoV-2 target nucleic acids are NOT DETECTED.  The SARS-CoV-2 RNA is generally detectable in upper respiratory specimens during the acute phase of infection. The lowest concentration of SARS-CoV-2 viral copies this assay can detect is 138 copies/mL. A negative result does not preclude SARS-Cov-2 infection and should not be used as the sole basis for treatment or other patient management decisions. A negative result may occur with  improper specimen  collection/handling, submission of specimen other than nasopharyngeal swab, presence of viral mutation(s) within the areas targeted by this assay, and inadequate number of viral copies(<138 copies/mL). A negative result must be combined with clinical observations, patient history, and epidemiological information. The expected result is Negative.  Fact Sheet for Patients:  EntrepreneurPulse.com.au  Fact Sheet for Healthcare Providers:  IncredibleEmployment.be  This test is no t yet approved or cleared by the [place] FDA and  has been authorized for detection and/or diagnosis of SARS-CoV-2 by FDA under an Emergency Use Authorization (EUA). This EUA will remain  in effect (meaning this test can be used) for the duration of the COVID-19 declaration under Section 564(b)(1) of the Act, 21 U.S.C.section 360bbb-3(b)(1), unless the authorization is terminated  or revoked sooner.       Influenza A by PCR NEGATIVE NEGATIVE Final   Influenza B by PCR NEGATIVE NEGATIVE Final    Comment: (NOTE) The Xpert Xpress SARS-CoV-2/FLU/RSV plus assay is intended as an aid in the diagnosis of influenza from Nasopharyngeal swab specimens and should not be used as a sole basis for treatment. Nasal washings and aspirates are unacceptable for Xpert Xpress SARS-CoV-2/FLU/RSV testing.  Fact Sheet for Patients: EntrepreneurPulse.com.au  Fact Sheet for Healthcare Providers: IncredibleEmployment.be  This test is not yet approved or cleared by the [place] FDA and has been authorized for detection and/or diagnosis of SARS-CoV-2 by FDA under an Emergency Use Authorization (EUA). This EUA will remain in effect (meaning this test can be used) for the duration of the COVID-19 declaration under Section 564(b)(1) of the Act, 21 U.S.C. section 360bbb-3(b)(1), unless the authorization is terminated or revoked.  Performed at [facility], [address], [place], [place] [address]           Radiology Studies: No results found.      Scheduled Meds:  colchicine  0.6 mg Oral Daily   heparin  5,000 Units Subcutaneous Q8H   insulin aspart  0-15 Units Subcutaneous Q4H   insulin aspart  2 Units Subcutaneous TID WC   insulin detemir  34 Units Subcutaneous QHS   Continuous Infusions:  cefTRIAXone (ROCEPHIN)  IV 2 g (01/01/21 1612)   metronidazole 500 mg (01/02/21 0858)   vancomycin 1,500 mg (01/02/21 0446)     LOS: 5 days       Hosie Poisson, MD [facility]   To contact the attending provider between 7A-7P or the covering provider during after hours 7P-7A, please log into the web site [email or web address] and access using universal [place] password for that web site. If you do not have the password, please call the hospital operator.  01/02/2021, 1:03 PM

## 2021-01-03 ENCOUNTER — Inpatient Hospital Stay (HOSPITAL_COMMUNITY): Payer: Medicaid Other | Admitting: Certified Registered"

## 2021-01-03 ENCOUNTER — Encounter (HOSPITAL_COMMUNITY): Payer: Self-pay | Admitting: Internal Medicine

## 2021-01-03 ENCOUNTER — Encounter (HOSPITAL_COMMUNITY)
Admission: EM | Disposition: A | Discharge status: Home / Self Care | Payer: Self-pay | Source: Home / Self Care | Attending: Internal Medicine

## 2021-01-03 HISTORY — PX: AMPUTATION: SHX166

## 2021-01-03 LAB — BASIC METABOLIC PANEL
Anion gap: 8 (ref 5–15)
BUN: 20 mg/dL (ref 8–23)
CO2: 25 mmol/L (ref 22–32)
Calcium: 8.6 mg/dL — ABNORMAL LOW (ref 8.9–10.3)
Chloride: 98 mmol/L (ref 98–111)
Creatinine, Ser: 0.97 mg/dL (ref 0.61–1.24)
GFR, Estimated: >60 mL/min (ref >60)
Glucose, Bld: 189 mg/dL — ABNORMAL HIGH (ref 70–99)
Potassium: 3.7 mmol/L (ref 3.5–5.1)
Sodium: 131 mmol/L — ABNORMAL LOW (ref 135–145)

## 2021-01-03 LAB — CULTURE, BLOOD (ROUTINE X 2)
Culture: NO GROWTH
Culture: NO GROWTH
Special Requests: ADEQUATE
Special Requests: ADEQUATE

## 2021-01-03 LAB — GLUCOSE, CAPILLARY
Glucose-Capillary: 184 mg/dL — ABNORMAL HIGH (ref 70–99)
Glucose-Capillary: 190 mg/dL — ABNORMAL HIGH (ref 70–99)
Glucose-Capillary: 195 mg/dL — ABNORMAL HIGH (ref 70–99)
Glucose-Capillary: 203 mg/dL — ABNORMAL HIGH (ref 70–99)
Glucose-Capillary: 216 mg/dL — ABNORMAL HIGH (ref 70–99)
Glucose-Capillary: 337 mg/dL — ABNORMAL HIGH (ref 70–99)
Glucose-Capillary: 373 mg/dL — ABNORMAL HIGH (ref 70–99)

## 2021-01-03 LAB — CBC
HCT: 37.4 % — ABNORMAL LOW (ref 39.0–52.0)
Hemoglobin: 13.2 g/dL (ref 13.0–17.0)
MCH: 32 pg (ref 26.0–34.0)
MCHC: 35.3 g/dL (ref 30.0–36.0)
MCV: 90.6 fL (ref 80.0–100.0)
Platelets: 215 10*3/uL (ref 150–400)
RBC: 4.13 MIL/uL — ABNORMAL LOW (ref 4.22–5.81)
RDW: 13.1 % (ref 11.5–15.5)
WBC: 8.7 10*3/uL (ref 4.0–10.5)
nRBC: 0 % (ref 0.0–0.2)

## 2021-01-03 SURGERY — AMPUTATION, FOOT, PARTIAL
Anesthesia: Monitor Anesthesia Care | Site: Foot | Laterality: Right

## 2021-01-03 MED ORDER — OXYCODONE HCL 5 MG PO TABS: 5.0000 mg | ORAL_TABLET | ORAL | Status: DC | PRN

## 2021-01-03 MED ORDER — POTASSIUM CHLORIDE CRYS ER 20 MEQ PO TBCR: 20.0000 meq | EXTENDED_RELEASE_TABLET | Freq: Every day | ORAL | Status: DC | PRN

## 2021-01-03 MED ORDER — HYDROMORPHONE HCL 1 MG/ML IJ SOLN: 0.2500 mg | INTRAMUSCULAR | Status: DC | PRN

## 2021-01-03 MED ORDER — METOPROLOL TARTRATE 12.5 MG HALF TABLET
12.5000 mg | ORAL_TABLET | Freq: Two times a day (BID) | ORAL | Status: DC
Start: 2021-01-03 — End: 2021-01-05
  Administered 2021-01-03 – 2021-01-05 (×5): 12.5 mg via ORAL
  Filled 2021-01-03 (×5): qty 1

## 2021-01-03 MED ORDER — DEXAMETHASONE SODIUM PHOSPHATE 10 MG/ML IJ SOLN
INTRAMUSCULAR | Status: DC | PRN
  Administered 2021-01-03: 10 mg

## 2021-01-03 MED ORDER — MAGNESIUM SULFATE 2 GM/50ML IV SOLN
2.0000 g | Freq: Every day | INTRAVENOUS | Status: DC | PRN
  Filled 2021-01-03: qty 50

## 2021-01-03 MED ORDER — ENSURE PRE-SURGERY PO LIQD
296.0000 mL | Freq: Once | ORAL | Status: AC
  Administered 2021-01-03: 04:00:00 296 mL via ORAL

## 2021-01-03 MED ORDER — COLCHICINE 0.6 MG PO TABS
0.6000 mg | ORAL_TABLET | Freq: Two times a day (BID) | ORAL | Status: DC
  Administered 2021-01-03 – 2021-01-05 (×4): 0.6 mg via ORAL
  Filled 2021-01-03 (×5): qty 1

## 2021-01-03 MED ORDER — JUVEN PO PACK
1.0000 | PACK | Freq: Two times a day (BID) | ORAL | Status: DC
  Administered 2021-01-03 – 2021-01-05 (×5): 1 via ORAL
  Filled 2021-01-03 (×6): qty 1

## 2021-01-03 MED ORDER — POLYETHYLENE GLYCOL 3350 17 G PO PACK: 17.0000 g | PACK | Freq: Every day | ORAL | Status: DC | PRN

## 2021-01-03 MED ORDER — HYDROMORPHONE HCL 1 MG/ML IJ SOLN
0.5000 mg | INTRAMUSCULAR | Status: DC | PRN
  Administered 2021-01-03 – 2021-01-05 (×8): 1 mg via INTRAVENOUS
  Filled 2021-01-03 (×8): qty 1

## 2021-01-03 MED ORDER — AMITRIPTYLINE HCL 25 MG PO TABS: 25.0000 mg | ORAL_TABLET | Freq: Every evening | ORAL | Status: DC | PRN

## 2021-01-03 MED ORDER — LACTATED RINGERS IV SOLN: INTRAVENOUS | Status: DC | PRN

## 2021-01-03 MED ORDER — FENTANYL CITRATE (PF) 250 MCG/5ML IJ SOLN
INTRAMUSCULAR | Status: AC
  Filled 2021-01-03: qty 5

## 2021-01-03 MED ORDER — PHENOL 1.4 % MT LIQD: 1.0000 | OROMUCOSAL | Status: DC | PRN

## 2021-01-03 MED ORDER — PROPOFOL 10 MG/ML IV BOLUS
INTRAVENOUS | Status: AC
  Filled 2021-01-03: qty 20

## 2021-01-03 MED ORDER — PROPOFOL 1000 MG/100ML IV EMUL
INTRAVENOUS | Status: AC
  Filled 2021-01-03: qty 100

## 2021-01-03 MED ORDER — DOCUSATE SODIUM 100 MG PO CAPS
100.0000 mg | ORAL_CAPSULE | Freq: Every day | ORAL | Status: DC
  Administered 2021-01-04 – 2021-01-05 (×2): 100 mg via ORAL
  Filled 2021-01-03 (×4): qty 1

## 2021-01-03 MED ORDER — ROPIVACAINE HCL 5 MG/ML IJ SOLN
INTRAMUSCULAR | Status: DC | PRN
  Administered 2021-01-03: 40 mL via PERINEURAL

## 2021-01-03 MED ORDER — INSULIN DETEMIR 100 UNIT/ML ~~LOC~~ SOLN
22.0000 [IU] | Freq: Two times a day (BID) | SUBCUTANEOUS | Status: DC
Start: 2021-01-03 — End: 2021-01-04
  Administered 2021-01-03 (×2): 22 [IU] via SUBCUTANEOUS
  Filled 2021-01-03 (×5): qty 0.22

## 2021-01-03 MED ORDER — OXYCODONE HCL 5 MG PO TABS: 5.0000 mg | ORAL_TABLET | Freq: Once | ORAL | Status: DC | PRN

## 2021-01-03 MED ORDER — ONDANSETRON HCL 4 MG/2ML IJ SOLN: 4.0000 mg | Freq: Four times a day (QID) | INTRAMUSCULAR | Status: DC | PRN

## 2021-01-03 MED ORDER — ZINC SULFATE 220 (50 ZN) MG PO CAPS
220.0000 mg | ORAL_CAPSULE | Freq: Every day | ORAL | Status: DC
  Administered 2021-01-03 – 2021-01-05 (×3): 220 mg via ORAL
  Filled 2021-01-03 (×3): qty 1

## 2021-01-03 MED ORDER — ALUM & MAG HYDROXIDE-SIMETH 200-200-20 MG/5ML PO SUSP: 15.0000 mL | ORAL | Status: DC | PRN

## 2021-01-03 MED ORDER — ONDANSETRON HCL 4 MG/2ML IJ SOLN
INTRAMUSCULAR | Status: AC
  Filled 2021-01-03: qty 2

## 2021-01-03 MED ORDER — MAGNESIUM CITRATE PO SOLN: 1.0000 | Freq: Once | ORAL | Status: DC | PRN

## 2021-01-03 MED ORDER — BISACODYL 5 MG PO TBEC: 5.0000 mg | DELAYED_RELEASE_TABLET | Freq: Every day | ORAL | Status: DC | PRN

## 2021-01-03 MED ORDER — SODIUM CHLORIDE 0.9 % IV SOLN: INTRAVENOUS | Status: DC

## 2021-01-03 MED ORDER — PANTOPRAZOLE SODIUM 40 MG PO TBEC
40.0000 mg | DELAYED_RELEASE_TABLET | Freq: Every day | ORAL | Status: DC
  Administered 2021-01-03 – 2021-01-05 (×3): 40 mg via ORAL
  Filled 2021-01-03 (×3): qty 1

## 2021-01-03 MED ORDER — OXYCODONE HCL 5 MG PO TABS
10.0000 mg | ORAL_TABLET | ORAL | Status: DC | PRN
  Administered 2021-01-03 – 2021-01-05 (×6): 15 mg via ORAL
  Filled 2021-01-03 (×6): qty 3

## 2021-01-03 MED ORDER — PROPOFOL 500 MG/50ML IV EMUL
INTRAVENOUS | Status: DC | PRN
  Administered 2021-01-03: 150 ug/kg/min via INTRAVENOUS

## 2021-01-03 MED ORDER — MIDAZOLAM HCL 2 MG/2ML IJ SOLN
INTRAMUSCULAR | Status: DC | PRN
  Administered 2021-01-03 (×2): 1 mg via INTRAVENOUS

## 2021-01-03 MED ORDER — ACETAMINOPHEN 325 MG PO TABS
325.0000 mg | ORAL_TABLET | Freq: Four times a day (QID) | ORAL | Status: DC | PRN
  Administered 2021-01-03: 23:00:00 650 mg via ORAL
  Filled 2021-01-03: qty 2

## 2021-01-03 MED ORDER — INSULIN ASPART 100 UNIT/ML IJ SOLN
5.0000 [IU] | Freq: Three times a day (TID) | INTRAMUSCULAR | Status: DC
  Administered 2021-01-03 (×2): 5 [IU] via SUBCUTANEOUS

## 2021-01-03 MED ORDER — METOPROLOL TARTRATE 5 MG/5ML IV SOLN: 2.0000 mg | INTRAVENOUS | Status: DC | PRN

## 2021-01-03 MED ORDER — DEXAMETHASONE SODIUM PHOSPHATE 10 MG/ML IJ SOLN
INTRAMUSCULAR | Status: AC
  Filled 2021-01-03: qty 1

## 2021-01-03 MED ORDER — AMISULPRIDE (ANTIEMETIC) 5 MG/2ML IV SOLN: 10.0000 mg | Freq: Once | INTRAVENOUS | Status: DC | PRN

## 2021-01-03 MED ORDER — ASCORBIC ACID 500 MG PO TABS
1000.0000 mg | ORAL_TABLET | Freq: Every day | ORAL | Status: DC
  Administered 2021-01-03 – 2021-01-05 (×3): 1000 mg via ORAL
  Filled 2021-01-03 (×3): qty 2

## 2021-01-03 MED ORDER — LABETALOL HCL 5 MG/ML IV SOLN: 10.0000 mg | INTRAVENOUS | Status: DC | PRN

## 2021-01-03 MED ORDER — MIDAZOLAM HCL 2 MG/2ML IJ SOLN
INTRAMUSCULAR | Status: AC
  Filled 2021-01-03: qty 2

## 2021-01-03 MED ORDER — 0.9 % SODIUM CHLORIDE (POUR BTL) OPTIME
TOPICAL | Status: DC | PRN
  Administered 2021-01-03: 08:00:00 1000 mL

## 2021-01-03 MED ORDER — PHENYLEPHRINE 40 MCG/ML (10ML) SYRINGE FOR IV PUSH (FOR BLOOD PRESSURE SUPPORT)
PREFILLED_SYRINGE | INTRAVENOUS | Status: AC
  Filled 2021-01-03: qty 10

## 2021-01-03 MED ORDER — FENTANYL CITRATE (PF) 250 MCG/5ML IJ SOLN
INTRAMUSCULAR | Status: DC | PRN
  Administered 2021-01-03 (×2): 50 ug via INTRAVENOUS

## 2021-01-03 MED ORDER — GABAPENTIN 300 MG PO CAPS
300.0000 mg | ORAL_CAPSULE | Freq: Three times a day (TID) | ORAL | Status: DC
  Administered 2021-01-03 – 2021-01-05 (×7): 300 mg via ORAL
  Filled 2021-01-03 (×7): qty 1

## 2021-01-03 MED ORDER — PHENYLEPHRINE 40 MCG/ML (10ML) SYRINGE FOR IV PUSH (FOR BLOOD PRESSURE SUPPORT)
PREFILLED_SYRINGE | INTRAVENOUS | Status: DC | PRN
  Administered 2021-01-03: 80 ug via INTRAVENOUS
  Administered 2021-01-03 (×2): 120 ug via INTRAVENOUS
  Administered 2021-01-03: 80 ug via INTRAVENOUS

## 2021-01-03 MED ORDER — OXYCODONE HCL 5 MG/5ML PO SOLN: 5.0000 mg | Freq: Once | ORAL | Status: DC | PRN

## 2021-01-03 MED ORDER — GUAIFENESIN-DM 100-10 MG/5ML PO SYRP: 15.0000 mL | ORAL_SOLUTION | ORAL | Status: DC | PRN

## 2021-01-03 MED ORDER — HYDRALAZINE HCL 20 MG/ML IJ SOLN: 5.0000 mg | INTRAMUSCULAR | Status: DC | PRN

## 2021-01-03 SURGICAL SUPPLY — 37 items
BAG COUNTER SPONGE SURGICOUNT (BAG) ×1 IMPLANT
BAG SURGICOUNT SPONGE COUNTING (BAG)
BENZOIN TINCTURE PRP APPL 2/3 (GAUZE/BANDAGES/DRESSINGS) ×3 IMPLANT
BLADE SAW SGTL HD 18.5X60.5X1. (BLADE) ×3 IMPLANT
BLADE SURG 21 STRL SS (BLADE) ×3 IMPLANT
BNDG COHESIVE 4X5 TAN STRL (GAUZE/BANDAGES/DRESSINGS) IMPLANT
BNDG COHESIVE 6X5 TAN NS LF (GAUZE/BANDAGES/DRESSINGS) ×2 IMPLANT
BNDG GAUZE ELAST 4 BULKY (GAUZE/BANDAGES/DRESSINGS) IMPLANT
COVER SURGICAL LIGHT HANDLE (MISCELLANEOUS) ×3 IMPLANT
DRAPE DERMATAC (DRAPES) ×4 IMPLANT
DRAPE INCISE IOBAN 66X45 STRL (DRAPES) ×3 IMPLANT
DRAPE U-SHAPE 47X51 STRL (DRAPES) ×3 IMPLANT
DRESSING PEEL AND PLC PRVNA 13 (GAUZE/BANDAGES/DRESSINGS) IMPLANT
DRSG ADAPTIC 3X8 NADH LF (GAUZE/BANDAGES/DRESSINGS) IMPLANT
DRSG PAD ABDOMINAL 8X10 ST (GAUZE/BANDAGES/DRESSINGS) IMPLANT
DRSG PEEL AND PLACE PREVENA 13 (GAUZE/BANDAGES/DRESSINGS) ×3
DURAPREP 26ML APPLICATOR (WOUND CARE) ×3 IMPLANT
ELECT REM PT RETURN 9FT ADLT (ELECTROSURGICAL) ×3
ELECTRODE REM PT RTRN 9FT ADLT (ELECTROSURGICAL) ×1 IMPLANT
GAUZE SPONGE 4X4 12PLY STRL (GAUZE/BANDAGES/DRESSINGS) IMPLANT
GLOVE SURG ORTHO LTX SZ9 (GLOVE) ×3 IMPLANT
GLOVE SURG UNDER POLY LF SZ9 (GLOVE) ×3 IMPLANT
GOWN STRL REUS W/ TWL XL LVL3 (GOWN DISPOSABLE) ×3 IMPLANT
GOWN STRL REUS W/TWL XL LVL3 (GOWN DISPOSABLE) ×6
KIT BASIN OR (CUSTOM PROCEDURE TRAY) ×3 IMPLANT
KIT TURNOVER KIT B (KITS) ×3 IMPLANT
NS IRRIG 1000ML POUR BTL (IV SOLUTION) ×3 IMPLANT
PACK ORTHO EXTREMITY (CUSTOM PROCEDURE TRAY) ×3 IMPLANT
PAD ARMBOARD 7.5X6 YLW CONV (MISCELLANEOUS) ×6 IMPLANT
SPONGE T-LAP 18X18 ~~LOC~~+RFID (SPONGE) IMPLANT
SUT ETHILON 2 0 PSLX (SUTURE) ×6 IMPLANT
TOWEL GREEN STERILE (TOWEL DISPOSABLE) ×3 IMPLANT
TOWEL GREEN STERILE FF (TOWEL DISPOSABLE) ×3 IMPLANT
TUBE CONNECTING 12'X1/4 (SUCTIONS) ×1
TUBE CONNECTING 12X1/4 (SUCTIONS) ×2 IMPLANT
WATER STERILE IRR 1000ML POUR (IV SOLUTION) ×1 IMPLANT
YANKAUER SUCT BULB TIP NO VENT (SUCTIONS) ×3 IMPLANT

## 2021-01-03 NOTE — Anesthesia Preprocedure Evaluation (Addendum)
Anesthesia Evaluation  Patient identified by MRN, date of birth, ID band Patient awake    Reviewed: Allergy & Precautions, NPO status , Patient's Chart, lab work & pertinent test results  Airway Mallampati: III  TM Distance: >3 FB Neck ROM: Full    Dental  (+) Missing, Chipped, Poor Dentition, Dental Advisory Given,    Pulmonary neg pulmonary ROS,    Pulmonary exam normal breath sounds clear to auscultation       Cardiovascular hypertension, Pt. on medications and Pt. on home beta blockers Normal cardiovascular exam Rhythm:Regular Rate:Normal     Neuro/Psych PSYCHIATRIC DISORDERS Anxiety negative neurological ROS     GI/Hepatic negative GI ROS, Neg liver ROS,   Endo/Other  diabetes, Poorly Controlled, Type 2, Oral Hypoglycemic Agents, Insulin Dependenta1c 8.2  Renal/GU negative Renal ROS  negative genitourinary   Musculoskeletal  (+) Arthritis , Osteoarthritis,  R foot gangrene    Abdominal (+) + obese,   Peds  Hematology negative hematology ROS (+) hct 37.4, plt 215   Anesthesia Other Findings   Reproductive/Obstetrics negative OB ROS                            Anesthesia Physical Anesthesia Plan  ASA: 3  Anesthesia Plan: MAC and Regional   Post-op Pain Management: Tylenol PO (pre-op) and Regional block   Induction:   PONV Risk Score and Plan: Propofol infusion, TIVA and Treatment may vary due to age or medical condition  Airway Management Planned: Natural Airway and Simple Face Mask  Additional Equipment: None  Intra-op Plan:   Post-operative Plan:   Informed Consent: I have reviewed the patients History and Physical, chart, labs and discussed the procedure including the risks, benefits and alternatives for the proposed anesthesia with the patient or authorized representative who has indicated his/her understanding and acceptance.     Dental advisory given  Plan  Discussed with: CRNA  Anesthesia Plan Comments:         Anesthesia Quick Evaluation

## 2021-01-03 NOTE — Anesthesia Procedure Notes (Signed)
Procedure Name: MAC Date/Time: 01/03/2021 8:38 AM Performed by: Barrington Ellison, CRNA Pre-anesthesia Checklist: Patient identified, Emergency Drugs available, Suction available, Patient being monitored and Timeout performed Patient Re-evaluated:Patient Re-evaluated prior to induction Oxygen Delivery Method: Simple face mask

## 2021-01-03 NOTE — Interval H&P Note (Signed)
History and Physical Interval Note:  01/03/2021 6:51 AM  [name]  has presented today for surgery, with the diagnosis of Gangrene Right Foot.  The various methods of treatment have been discussed with the patient and family. After consideration of risks, benefits and other options for treatment, the patient has consented to  Procedure(s): RIGHT TRANSMETATARSAL AMPUTATION (Right) as a surgical intervention.  The patient's history has been reviewed, patient examined, no change in status, stable for surgery.  I have reviewed the patient's chart and labs.  Questions were answered to the patient's satisfaction.     [name]

## 2021-01-03 NOTE — Progress Notes (Signed)
Orthopedic Tech Progress Note Patient Details:  [name] 07/17/1955 [id number]  Patient refused DARCO shoe as well as a regular POST OP SHOE, stating "he wants a pair of sneakers modified,he already has a closet full of these shoes, and that he fell and broke his hip/ femur before trying to wear darco shoes.   Patient ID: [name], male   DOB: 1955/02/17, 65 y.o.   MRN: [id number]  Janit Pagan 01/03/2021, 4:01 PM

## 2021-01-03 NOTE — Transfer of Care (Signed)
Immediate Anesthesia Transfer of Care Note  Patient: [place]  Procedure(s) Performed: RIGHT TRANSMETATARSAL AMPUTATION (Right: Foot)  Patient Location: PACU  Anesthesia Type:MAC and Regional  Level of Consciousness: awake, alert  and oriented  Airway & Oxygen Therapy: Patient Spontanous Breathing  Post-op Assessment: Report given to RN  Post vital signs: Reviewed and stable  Last Vitals:  Vitals Value Taken Time  BP 104/75 01/03/21 0918  Temp    Pulse 96 01/03/21 0918  Resp 22 01/03/21 0918  SpO2 100 % 01/03/21 0918  Vitals shown include unvalidated device data.  Last Pain:  Vitals:   01/03/21 0738  TempSrc: Oral  PainSc:       Patients Stated Pain Goal: 0 (96/29/52 8413)  Complications: No notable events documented.

## 2021-01-03 NOTE — Op Note (Signed)
01/03/2021  9:19 AM  PATIENT:  [name]    PRE-OPERATIVE DIAGNOSIS:  Gangrene Right Foot  POST-OPERATIVE DIAGNOSIS:  Same  PROCEDURE:  RIGHT TRANSMETATARSAL AMPUTATION  SURGEON:  [name], MD  PHYSICIAN ASSISTANT:None ANESTHESIA:   General  PREOPERATIVE INDICATIONS:  [name] is a  65 y.o. male with a diagnosis of Gangrene Right Foot who failed conservative measures and elected for surgical management.    The risks benefits and alternatives were discussed with the patient preoperatively including but not limited to the risks of infection, bleeding, nerve injury, cardiopulmonary complications, the need for revision surgery, among others, and the patient was willing to proceed.  OPERATIVE IMPLANTS: Praveena 13 cm wound VAC  @ENCIMAGES @  OPERATIVE FINDINGS: Branches of the posterior tibial artery open the dorsalis pedis was blocked.  There was good petechial bleeding.  OPERATIVE PROCEDURE: Patient brought the operating room after undergoing a regional anesthetic.  After adequate levels anesthesia were obtained patient's right lower extremity was prepped using DuraPrep draped into a sterile field a timeout was called.  A fishmouth incision was made just proximal to the ischemic tissue.  This was carried sharply down to bone and an oscillating saw was used to perform a transmetatarsal amputation.  Electrocardio was used hemostasis the wound was irrigated with normal saline.  The incision was closed using 2-0 nylon.  A 13 cm Prevena wound VAC was applied this had a good suction fit this was overwrapped with Coban patient was taken the PACU in stable condition.   DISCHARGE PLANNING:  Antibiotic duration: 24 hours antibiotics postoperatively margins were clear.  Weightbearing: Ideally nonweightbearing but patient may place weight on his heel as needed.  Pain medication: Opioid pathway  Dressing care/ Wound VAC: Continue wound VAC for 1 week  Ambulatory devices:  Walker  Discharge to: Anticipate discharge to home when safe with therapy.  Follow-up: In the office 1 week post operative.

## 2021-01-03 NOTE — Anesthesia Postprocedure Evaluation (Signed)
Anesthesia Post Note  Patient: [place]  Procedure(s) Performed: RIGHT TRANSMETATARSAL AMPUTATION (Right: Foot)     Patient location during evaluation: PACU Anesthesia Type: Regional and MAC Level of consciousness: awake and alert Pain management: pain level controlled Vital Signs Assessment: post-procedure vital signs reviewed and stable Respiratory status: spontaneous breathing, nonlabored ventilation and respiratory function stable Cardiovascular status: blood pressure returned to baseline and stable Postop Assessment: no apparent nausea or vomiting Anesthetic complications: no   No notable events documented.  Last Vitals:  Vitals:   01/03/21 0933 01/03/21 0948  BP: 123/82 101/64  Pulse: 85 94  Resp: 15 16  Temp:  36.5 C  SpO2: 98% 96%    Last Pain:  Vitals:   01/03/21 0948  TempSrc:   PainSc: 0-No pain        RLE Motor Response: Purposeful movement (01/03/21 0948) RLE Sensation: Decreased;No pain;No numbness;No tingling (01/03/21 0948)      Pervis Hocking

## 2021-01-03 NOTE — Progress Notes (Signed)
PROGRESS NOTE  [name] VEL:[id number] DOB: 11-13-1955   PCP: [name] [place]  Patient is from: Home.   DOA: 12/28/2020 LOS: 6  Chief complaints:  No chief complaint on file.    Brief Narrative / Interim history: 65 year old M with PMH of right diabetic foot ulcer, NASH, left TMT amputation and HTN admitted to [facility] with right foot diabetic foot ulcer infection/cellulitis.  Started on broad-spectrum antibiotics and transferred to [facility] for TMT amputation by [name].  Underwent TMT amputation on 01/03/2021.  Subjective: Seen and examined earlier this morning after he returned from right TMT amputation.  No complaints.  Likes to eat.  Denies pain.  Objective: Vitals:   01/03/21 0918 01/03/21 0933 01/03/21 0948 01/03/21 1007  BP: 104/75 123/82 101/64 119/68  Pulse: 96 85 94 94  Resp: 19 15 16 17   Temp: 98.2 F (36.8 C)  97.7 F (36.5 C) 98.9 F (37.2 C)  TempSrc:    Oral  SpO2: 99% 98% 96% 97%  Weight:      Height:        Intake/Output Summary (Last 24 hours) at 01/03/2021 1234 Last data filed at 01/03/2021 0912 Gross per 24 hour  Intake 640 ml  Output 50 ml  Net 590 ml     Examination:  GENERAL: No apparent distress.  Nontoxic. HEENT: MMM.  Vision and hearing grossly intact.  NECK: Supple.  No apparent JVD.  RESP:  No IWOB.  Fair aeration bilaterally. CVS:  RRR. Heart sounds normal.  ABD/GI/GU: BS+. Abd soft, NTND.  MSK/EXT:  Moves extremities.  Neuro right TMT and altered left TMT amputation SKIN: no apparent skin lesion or wound NEURO: Awake, alert and oriented appropriately.  No apparent focal neuro deficit. PSYCH: Calm. Normal affect.   Procedures:  01/03/2021-right TMT amputation  Microbiology summarized: CHENI-77 and influenza PCR nonreactive. MRSA PCR screen negative. Blood cultures NGTD  Assessment & Plan: Right diabetic foot ulcer infection/cellulitis -S/p right TMT amputation by [name] on  01/03/2021 -Continue broad-spectrum antibiotics at least for 24 hours postop -Pain control per orthopedic surgery -PT/OT eval    Uncontrolled DM-2 with hyperglycemia and neuropathy: A1c 8.2%.  Injects Levemir 110 to 115 units twice daily at home. Recent Labs  Lab 01/03/21 0043 01/03/21 0426 01/03/21 0740 01/03/21 0919 01/03/21 1121  GLUCAP 203* 184* 190* 195* 216*  -Increase Levemir from 38 units at bedtime to 22 units twice daily -Increase NovoLog from 3 to 5 units 3 times daily with meals -Continue SSI-moderate -Continue statin and gabapentin  Anemia of acute illness: Stable Recent Labs    04/12/20 2320 04/13/20 0549 04/15/20 0130 04/16/20 0314 04/17/20 0429 12/28/20 1727 12/29/20 0620 12/30/20 0443 01/01/21 0501 01/03/21 0628  HGB 9.6* 8.9* 8.3* 8.3* 8.2* 13.3 12.1* 10.9* 12.8* 13.2   Essential hypertension: Normotensive. -Resume home metoprolol at reduced dose -Continue holding home hydralazine -Hydralazine 25 mg TID PRN.   Lactic acidosis: Resolved.  Hyponatremia: Stable.   Hypokalemia: Resolved.  Body mass index is 28.31 kg/m.         DVT prophylaxis:  SCD's Start: 01/03/21 1037 heparin injection 5,000 Units Start: 12/29/20 0600  Code Status: Full code Family Communication: Patient and/or RN. Available if any question.  Level of care: Med-Surg Status is: Inpatient  Remains inpatient appropriate because: IV antibiotics and postop care  Final disposition: Likely home once cleared  Consultants:  Orthopedic surgery   Sch Meds:  Scheduled Meds:  vitamin C  1,000 mg Oral Daily  colchicine  0.6 mg Oral BID   [START ON 01/04/2021] docusate sodium  100 mg Oral Daily   heparin  5,000 Units Subcutaneous Q8H   insulin aspart  0-15 Units Subcutaneous Q4H   insulin aspart  5 Units Subcutaneous TID WC   insulin detemir  22 Units Subcutaneous BID   nutrition supplement (JUVEN)  1 packet Oral BID BM   pantoprazole  40 mg Oral Daily   zinc sulfate   220 mg Oral Daily   Continuous Infusions:  sodium chloride     cefTRIAXone (ROCEPHIN)  IV 2 g (01/02/21 1557)   magnesium sulfate bolus IVPB     metronidazole 500 mg (01/02/21 2004)   vancomycin 1,500 mg (01/03/21 0437)   PRN Meds:.[START ON 01/04/2021] acetaminophen, alum & mag hydroxide-simeth, bisacodyl, guaiFENesin-dextromethorphan, hydrALAZINE, HYDROmorphone (DILAUDID) injection, HYDROmorphone (DILAUDID) injection, magnesium sulfate bolus IVPB, methocarbamol, ondansetron, ondansetron **OR** [DISCONTINUED] ondansetron (ZOFRAN) IV, oxyCODONE, oxyCODONE, oxyCODONE, phenol, polyethylene glycol, potassium chloride  Antimicrobials: Anti-infectives (From admission, onward)    Start     Dose/Rate Route Frequency Ordered Stop   01/03/21 0600  ceFAZolin (ANCEF) IVPB 2g/100 mL premix        2 g 200 mL/hr over 30 Minutes Intravenous On call to O.R. 01/02/21 1905 01/03/21 0836   01/01/21 0445  vancomycin (VANCOREADY) IVPB 1500 mg/300 mL       See Hyperspace for full Linked Orders Report.   1,500 mg 150 mL/hr over 120 Minutes Intravenous Every 12 hours 12/31/20 1531     12/31/20 2200  vancomycin (VANCOREADY) IVPB 1500 mg/300 mL  Status:  Discontinued        1,500 mg 150 mL/hr over 120 Minutes Intravenous Every 12 hours 12/31/20 1015 12/31/20 1524   12/31/20 1630  vancomycin (VANCOREADY) IVPB 1500 mg/300 mL  Status:  Discontinued        1,500 mg 150 mL/hr over 120 Minutes Intravenous Every 12 hours 12/31/20 1524 12/31/20 1531   12/31/20 1615  vancomycin (VANCOREADY) IVPB 1250 mg/250 mL       See Hyperspace for full Linked Orders Report.   1,250 mg 166.7 mL/hr over 90 Minutes Intravenous  Once 12/31/20 1531 12/31/20 1630   12/29/20 2200  vancomycin (VANCOREADY) IVPB 1250 mg/250 mL  Status:  Discontinued        1,250 mg 166.7 mL/hr over 90 Minutes Intravenous Every 12 hours 12/29/20 1104 12/31/20 1015   12/29/20 1600  cefTRIAXone (ROCEPHIN) 2 g in sodium chloride 0.9 % 100 mL IVPB        2  g 200 mL/hr over 30 Minutes Intravenous Every 24 hours 12/28/20 2357     12/29/20 1000  vancomycin (VANCOREADY) IVPB 1500 mg/300 mL  Status:  Discontinued        1,500 mg 150 mL/hr over 120 Minutes Intravenous Every 12 hours 12/28/20 2058 12/29/20 1104   12/28/20 2100  metroNIDAZOLE (FLAGYL) IVPB 500 mg        500 mg 100 mL/hr over 60 Minutes Intravenous Every 12 hours 12/28/20 2044     12/28/20 2100  vancomycin (VANCOCIN) IVPB 1000 mg/200 mL premix        1,000 mg 200 mL/hr over 60 Minutes Intravenous  Once 12/28/20 2058 12/28/20 2311   12/28/20 1915  vancomycin (VANCOCIN) IVPB 1000 mg/200 mL premix        1,000 mg 200 mL/hr over 60 Minutes Intravenous  Once 12/28/20 1909 12/28/20 2210   12/28/20 1915  cefTRIAXone (ROCEPHIN) 2 g in sodium chloride 0.9 % 100 mL  IVPB        2 g 200 mL/hr over 30 Minutes Intravenous  Once 12/28/20 1909 12/28/20 2104        I have personally reviewed the following labs and images: CBC: Recent Labs  Lab 12/28/20 1727 12/29/20 0620 12/30/20 0443 01/01/21 0501 01/03/21 0628  WBC 10.4 8.9 6.2 7.3 8.7  HGB 13.3 12.1* 10.9* 12.8* 13.2  HCT 37.6* 34.0* 31.2* 34.6* 37.4*  MCV 94.0 93.2 92.9 90.6 90.6  PLT 214 148* 132* 181 215   BMP &GFR Recent Labs  Lab 12/28/20 1727 12/29/20 0620 12/30/20 0443 01/01/21 0501 01/03/21 0628  NA 129* 134* 135 132* 131*  K 4.3 3.5 3.4* 3.9 3.7  CL 94* 100 101 99 98  CO2 25 26 25 26 25   GLUCOSE 408* 217* 171* 205* 189*  BUN 22 18 14 9 20   CREATININE 1.20 1.03 0.93 1.06 0.97  CALCIUM 8.8* 8.5* 8.4* 8.7* 8.6*   Estimated Creatinine Clearance: 98.5 mL/min (by C-G formula based on SCr of 0.97 mg/dL). Liver & Pancreas: Recent Labs  Lab 12/28/20 1727  AST 17  ALT 18  ALKPHOS 113  BILITOT 1.1  PROT 8.0  ALBUMIN 4.1   No results for input(s): LIPASE, AMYLASE in the last 168 hours. No results for input(s): AMMONIA in the last 168 hours. Diabetic: No results for input(s): HGBA1C in the last 72  hours. Recent Labs  Lab 01/03/21 0043 01/03/21 0426 01/03/21 0740 01/03/21 0919 01/03/21 1121  GLUCAP 203* 184* 190* 195* 216*   Cardiac Enzymes: No results for input(s): CKTOTAL, CKMB, CKMBINDEX, TROPONINI in the last 168 hours. No results for input(s): PROBNP in the last 8760 hours. Coagulation Profile: No results for input(s): INR, PROTIME in the last 168 hours. Thyroid Function Tests: No results for input(s): TSH, T4TOTAL, FREET4, T3FREE, THYROIDAB in the last 72 hours. Lipid Profile: No results for input(s): CHOL, HDL, LDLCALC, TRIG, CHOLHDL, LDLDIRECT in the last 72 hours. Anemia Panel: No results for input(s): VITAMINB12, FOLATE, FERRITIN, TIBC, IRON, RETICCTPCT in the last 72 hours. Urine analysis:    Component Value Date/Time   COLORURINE YELLOW 04/13/2020 0035   APPEARANCEUR CLOUDY (A) 04/13/2020 0035   LABSPEC 1.016 04/13/2020 0035   PHURINE 5.0 04/13/2020 0035   GLUCOSEU 150 (A) 04/13/2020 0035   HGBUR MODERATE (A) 04/13/2020 0035   BILIRUBINUR NEGATIVE 04/13/2020 0035   KETONESUR 80 (A) 04/13/2020 0035   PROTEINUR NEGATIVE 04/13/2020 0035   NITRITE NEGATIVE 04/13/2020 0035   LEUKOCYTESUR LARGE (A) 04/13/2020 0035   Sepsis Labs: Invalid input(s): PROCALCITONIN, [place]  Microbiology: Recent Results (from the past 240 hour(s))  Blood culture (routine x 2)     Status: None (Preliminary result)   Collection Time: 12/28/20  5:27 PM   Specimen: BLOOD  Result Value Ref Range Status   Specimen Description BLOOD BLOOD RIGHT ARM  Final   Special Requests   Final    BOTTLES DRAWN AEROBIC AND ANAEROBIC Blood Culture adequate volume   Culture   Final    NO GROWTH 4 DAYS Performed at [facility], [address] [place], [place] [address]    Report Status PENDING  Incomplete  Blood culture (routine x 2)     Status: None (Preliminary result)   Collection Time: 12/28/20  5:27 PM   Specimen: BLOOD  Result Value Ref Range Status   Specimen Description BLOOD  BLOOD LEFT ARM  Final   Special Requests   Final    BOTTLES DRAWN AEROBIC AND ANAEROBIC Blood  Culture adequate volume   Culture   Final    NO GROWTH 4 DAYS Performed at [facility], [address] [place], [place] [address]    Report Status PENDING  Incomplete  Resp Panel by RT-PCR (Flu A&B, Covid) Nasopharyngeal Swab     Status: None   Collection Time: 12/28/20  7:18 PM   Specimen: Nasopharyngeal Swab; Nasopharyngeal(NP) swabs in vial transport medium  Result Value Ref Range Status   SARS Coronavirus 2 by RT PCR NEGATIVE NEGATIVE Final    Comment: (NOTE) SARS-CoV-2 target nucleic acids are NOT DETECTED.  The SARS-CoV-2 RNA is generally detectable in upper respiratory specimens during the acute phase of infection. The lowest concentration of SARS-CoV-2 viral copies this assay can detect is 138 copies/mL. A negative result does not preclude SARS-Cov-2 infection and should not be used as the sole basis for treatment or other patient management decisions. A negative result may occur with  improper specimen collection/handling, submission of specimen other than nasopharyngeal swab, presence of viral mutation(s) within the areas targeted by this assay, and inadequate number of viral copies(<138 copies/mL). A negative result must be combined with clinical observations, patient history, and epidemiological information. The expected result is Negative.  Fact Sheet for Patients:  EntrepreneurPulse.com.au  Fact Sheet for Healthcare Providers:  IncredibleEmployment.be  This test is no t yet approved or cleared by the [place] FDA and  has been authorized for detection and/or diagnosis of SARS-CoV-2 by FDA under an Emergency Use Authorization (EUA). This EUA will remain  in effect (meaning this test can be used) for the duration of the COVID-19 declaration under Section 564(b)(1) of the Act, 21 U.S.C.section 360bbb-3(b)(1), unless the authorization  is terminated  or revoked sooner.       Influenza A by PCR NEGATIVE NEGATIVE Final   Influenza B by PCR NEGATIVE NEGATIVE Final    Comment: (NOTE) The Xpert Xpress SARS-CoV-2/FLU/RSV plus assay is intended as an aid in the diagnosis of influenza from Nasopharyngeal swab specimens and should not be used as a sole basis for treatment. Nasal washings and aspirates are unacceptable for Xpert Xpress SARS-CoV-2/FLU/RSV testing.  Fact Sheet for Patients: EntrepreneurPulse.com.au  Fact Sheet for Healthcare Providers: IncredibleEmployment.be  This test is not yet approved or cleared by the [place] FDA and has been authorized for detection and/or diagnosis of SARS-CoV-2 by FDA under an Emergency Use Authorization (EUA). This EUA will remain in effect (meaning this test can be used) for the duration of the COVID-19 declaration under Section 564(b)(1) of the Act, 21 U.S.C. section 360bbb-3(b)(1), unless the authorization is terminated or revoked.  Performed at [facility], [address] [place], [place] [address]   MRSA Next Gen by PCR, Nasal     Status: None   Collection Time: 01/02/21 12:05 PM   Specimen: Nasal Mucosa; Nasal Swab  Result Value Ref Range Status   MRSA by PCR Next Gen NOT DETECTED NOT DETECTED Final    Comment: (NOTE) The GeneXpert MRSA Assay (FDA approved for NASAL specimens only), is one component of a comprehensive MRSA colonization surveillance program. It is not intended to diagnose MRSA infection nor to guide or monitor treatment for MRSA infections. Test performance is not FDA approved in patients less than 80 years old. Performed at [facility], [place] [address] [place], [place] [address]     Radiology Studies: No results found.    Peace Jost T. [place]  If 7PM-7AM, please contact night-coverage [email or web address] 01/03/2021, 12:34 PM

## 2021-01-03 NOTE — Progress Notes (Signed)
Pharmacy Antibiotic Note  [name] is a 65 y.o. male admitted on 12/28/2020 with  Gangrene/cellulits .  Pharmacy has been consulted for Vanco dosing.   ID: Cellulitis/gangrene  afebrile R lower extremit with DFU.  - Afebrile. WBC 8.7, Scr <1 - 12/21: R foot TMA  12/15 PM vanc>> (7d) CTX 12/15 >> Flagyl 12/15 >>  12/15 Bcx: ngtd 12/15: COVID/Flu: neg 12/20: MRSA PCR: neg  Plan: Vancomycin to 1500 MG IV Q12H  F/u to d/c Vanco post-amputation of 7d as previously specified (should end 12/22 after PM dose)      Height: 6' 2"  (188 cm) Weight: 100 kg (220 lb 7.4 oz) IBW/kg (Calculated) : 82.2  Temp (24hrs), Avg:98.3 F (36.8 C), Min:97.7 F (36.5 C), Max:98.9 F (37.2 C)  Recent Labs  Lab 12/28/20 1727 12/28/20 2033 12/29/20 0620 12/30/20 0443 01/01/21 0501 01/03/21 0628  WBC 10.4  --  8.9 6.2 7.3 8.7  CREATININE 1.20  --  1.03 0.93 1.06 0.97  LATICACIDVEN 2.5* 1.4  --   --   --   --     Estimated Creatinine Clearance: 98.5 mL/min (by C-G formula based on SCr of 0.97 mg/dL).    Allergies  Allergen Reactions   Other Hives    Anti-snake venim for copperhead venom.   Pravastatin Other (See Comments)    Leg cramps, elevated CK   Prednisone Other (See Comments)    Patient states "it runs my blood sugar up so I don't take it."   Toradol [Ketorolac Tromethamine] Hives and Nausea And Vomiting    Reaction to injection     Starkeisha Vanwinkle S. Alford Highland, PharmD, BCPS Clinical Staff Pharmacist [email or web address] [place] 01/03/2021 10:56 AM

## 2021-01-03 NOTE — Anesthesia Procedure Notes (Signed)
Anesthesia Regional Block: Adductor canal block   Pre-Anesthetic Checklist: , timeout performed,  Correct Patient, Correct Site, Correct Laterality,  Correct Procedure, Correct Position, site marked,  Risks and benefits discussed,  Surgical consent,  Pre-op evaluation,  At surgeon's request and post-op pain management  Laterality: Right  Prep: Maximum Sterile Barrier Precautions used, chloraprep       Needles:  Injection technique: Single-shot  Needle Type: Echogenic Stimulator Needle     Needle Length: 9cm  Needle Gauge: 22     Additional Needles:   Procedures:,,,, ultrasound used (permanent image in chart),,    Narrative:  Start time: 01/03/2021 8:05 AM End time: 01/03/2021 8:10 AM Injection made incrementally with aspirations every 5 mL.  Performed by: Personally  Anesthesiologist: Pervis Hocking, DO  Additional Notes: Monitors applied. No increased pain on injection. No increased resistance to injection. Injection made in 5cc increments. Good needle visualization. Patient tolerated procedure well.

## 2021-01-03 NOTE — Progress Notes (Signed)
Inpatient Diabetes Program Recommendations  AACE/ADA: New Consensus Statement on Inpatient Glycemic Control (2015)  Target Ranges:  Prepandial:   less than 140 mg/dL      Peak postprandial:   less than 180 mg/dL (1-2 hours)      Critically ill patients:  140 - 180 mg/dL   Lab Results  Component Value Date   GLUCAP 195 (H) 01/03/2021   HGBA1C 8.2 (H) 12/29/2020    Review of Glycemic Control  Latest Reference Range & Units 01/02/21 11:35 01/02/21 16:11 01/02/21 19:37 01/03/21 00:43 01/03/21 04:26 01/03/21 07:40  Glucose-Capillary 70 - 99 mg/dL 237 (H) 237 (H) 233 (H) 203 (H) 184 (H) 190 (H)   Diabetes history: DM 2 Outpatient Diabetes medications: Glipizide 2.5 mg Daily, Levemir 110-115 bid if CBG >110 Current orders for Inpatient glycemic control:  Levemir 38 units qhs Novolog 0-15 units Q4 Novolog 3 units tid meal coverage  Inpatient Diabetes Program Recommendations:    For surgery this am -  increase Semglee to 45 units   Thanks,  Tama Headings RN, MSN, BC-ADM Inpatient Diabetes Coordinator Team Pager [phone] (8a-5p)

## 2021-01-03 NOTE — Anesthesia Procedure Notes (Signed)
Anesthesia Regional Block: Popliteal block   Pre-Anesthetic Checklist: , timeout performed,  Correct Patient, Correct Site, Correct Laterality,  Correct Procedure, Correct Position, site marked,  Risks and benefits discussed,  Surgical consent,  Pre-op evaluation,  At surgeon's request and post-op pain management  Laterality: Right  Prep: Maximum Sterile Barrier Precautions used, chloraprep       Needles:  Injection technique: Single-shot  Needle Type: Echogenic Stimulator Needle     Needle Length: 9cm  Needle Gauge: 22     Additional Needles:   Procedures:,,,, ultrasound used (permanent image in chart),,    Narrative:  Start time: 01/03/2021 8:00 AM End time: 01/03/2021 8:05 AM Injection made incrementally with aspirations every 5 mL.  Performed by: Personally  Anesthesiologist: Pervis Hocking, DO  Additional Notes: Monitors applied. No increased pain on injection. No increased resistance to injection. Injection made in 5cc increments. Good needle visualization. Patient tolerated procedure well.

## 2021-01-04 ENCOUNTER — Telehealth: Payer: Self-pay | Admitting: Orthopedic Surgery

## 2021-01-04 ENCOUNTER — Encounter (HOSPITAL_COMMUNITY): Payer: Self-pay | Admitting: Orthopedic Surgery

## 2021-01-04 LAB — CBC
HCT: 32.6 % — ABNORMAL LOW (ref 39.0–52.0)
Hemoglobin: 12 g/dL — ABNORMAL LOW (ref 13.0–17.0)
MCH: 32.7 pg (ref 26.0–34.0)
MCHC: 36.8 g/dL — ABNORMAL HIGH (ref 30.0–36.0)
MCV: 88.8 fL (ref 80.0–100.0)
Platelets: 249 10*3/uL (ref 150–400)
RBC: 3.67 MIL/uL — ABNORMAL LOW (ref 4.22–5.81)
RDW: 12.7 % (ref 11.5–15.5)
WBC: 9.9 10*3/uL (ref 4.0–10.5)
nRBC: 0 % (ref 0.0–0.2)

## 2021-01-04 LAB — GLUCOSE, CAPILLARY
Glucose-Capillary: 192 mg/dL — ABNORMAL HIGH (ref 70–99)
Glucose-Capillary: 212 mg/dL — ABNORMAL HIGH (ref 70–99)
Glucose-Capillary: 229 mg/dL — ABNORMAL HIGH (ref 70–99)
Glucose-Capillary: 276 mg/dL — ABNORMAL HIGH (ref 70–99)
Glucose-Capillary: 299 mg/dL — ABNORMAL HIGH (ref 70–99)
Glucose-Capillary: 367 mg/dL — ABNORMAL HIGH (ref 70–99)

## 2021-01-04 LAB — RENAL FUNCTION PANEL
Albumin: 2.8 g/dL — ABNORMAL LOW (ref 3.5–5.0)
Anion gap: 9 (ref 5–15)
BUN: 33 mg/dL — ABNORMAL HIGH (ref 8–23)
CO2: 21 mmol/L — ABNORMAL LOW (ref 22–32)
Calcium: 8.5 mg/dL — ABNORMAL LOW (ref 8.9–10.3)
Chloride: 99 mmol/L (ref 98–111)
Creatinine, Ser: 1.03 mg/dL (ref 0.61–1.24)
GFR, Estimated: >60 mL/min (ref >60)
Glucose, Bld: 185 mg/dL — ABNORMAL HIGH (ref 70–99)
Phosphorus: 2.8 mg/dL (ref 2.5–4.6)
Potassium: 4 mmol/L (ref 3.5–5.1)
Sodium: 129 mmol/L — ABNORMAL LOW (ref 135–145)

## 2021-01-04 LAB — MAGNESIUM: Magnesium: 1.8 mg/dL (ref 1.7–2.4)

## 2021-01-04 MED ORDER — POLYETHYLENE GLYCOL 3350 17 G PO PACK: 17.0000 g | PACK | Freq: Two times a day (BID) | ORAL | Status: DC | PRN

## 2021-01-04 MED ORDER — INSULIN DETEMIR 100 UNIT/ML ~~LOC~~ SOLN
35.0000 [IU] | Freq: Two times a day (BID) | SUBCUTANEOUS | Status: DC
  Administered 2021-01-04 – 2021-01-05 (×2): 35 [IU] via SUBCUTANEOUS
  Filled 2021-01-04 (×3): qty 0.35

## 2021-01-04 MED ORDER — INSULIN ASPART 100 UNIT/ML IJ SOLN
7.0000 [IU] | Freq: Three times a day (TID) | INTRAMUSCULAR | Status: DC
  Administered 2021-01-04 – 2021-01-05 (×5): 7 [IU] via SUBCUTANEOUS

## 2021-01-04 MED ORDER — INSULIN DETEMIR 100 UNIT/ML ~~LOC~~ SOLN
30.0000 [IU] | Freq: Two times a day (BID) | SUBCUTANEOUS | Status: DC
Start: 2021-01-04 — End: 2021-01-04
  Administered 2021-01-04: 11:00:00 30 [IU] via SUBCUTANEOUS
  Filled 2021-01-04 (×2): qty 0.3

## 2021-01-04 MED ORDER — SENNOSIDES-DOCUSATE SODIUM 8.6-50 MG PO TABS: 1.0000 | ORAL_TABLET | Freq: Two times a day (BID) | ORAL | Status: DC | PRN

## 2021-01-04 NOTE — Telephone Encounter (Signed)
Patient requesting a call back. He is currently in the hospital. He states [name] saw him earlier on rounds.

## 2021-01-04 NOTE — Progress Notes (Signed)
Pt was seen for mobility to get up to side of bed and then stand, as well as to try gait on side of bed mult times.  Pt can maintain WB limits for all tasks excluding gait, even after practicing with all the other elements of transfers and standing wb control.  Pt is motivated to go home but will seek a rehab placement due to his impulsivity and struggle to maintain WB status.  Pt became upset with PT initially about his struggle but finally discussed goals of therapy.  Pt is recommended to SNF for recovery of at least the entrance of house and management of NWB on RLE.  Follow along with him as he is able to perform safely for gait and transfers, standing balance and strengthening to BLE's.  01/04/21 1200  PT Visit Information  Last PT Received On 01/04/21  Assistance Needed +2  History of Present Illness 65 y.o. 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 PHMx: HTN, DM, left transmet amp, back surgery.  Precautions  Precautions Fall  Precaution Comments R transmet amp  Required Braces or Orthoses Other Brace  Restrictions  Weight Bearing Restrictions Yes  RLE Weight Bearing TWB  LLE Weight Bearing WBAT  Home Living  Family/patient expects to be discharged to: Private residence  Living Arrangements Alone  Available Help at Discharge Family;Friend(s);Available PRN/intermittently  Type of [place] entrance  Home Layout One level;Full bath on main level  Engineer, materials  Prior Function  Prior Level of Function  Independent/Modified Independent  Mobility Comments used walker to maintain WB limits in the past if they were present  Communication  Communication No difficulties  Pain Assessment  Pain Assessment Faces  Faces Pain Scale 4  Pain Location RLE--throbbing  Pain Descriptors / Indicators Guarding;Aching  Pain Intervention(s)  Limited activity within patient's tolerance;Monitored during session;Premedicated before session;Repositioned  Cognition  Arousal/Alertness Awake/alert  Behavior During Therapy Impulsive;Restless  Overall Cognitive Status No family/caregiver present to determine baseline cognitive functioning  General Comments pt cannot maintain NWB on RLE unless physically cued, but also is impulsive and denies he is wb on R foot with gait  Upper Extremity Assessment  Upper Extremity Assessment Defer to OT evaluation  Lower Extremity Assessment  Lower Extremity Assessment LLE deficits/detail;RLE deficits/detail  RLE Deficits / Details new transmet amp  RLE Coordination decreased gross motor  LLE Deficits / Details healed transmet amp  LLE Coordination decreased gross motor  Cervical / Trunk Assessment  Cervical / Trunk Assessment Kyphotic (mild)  Bed Mobility  Overal bed mobility Needs Assistance  Bed Mobility Supine to Sit;Sit to Supine  Supine to sit Min guard  Sit to supine Min guard  Transfers  Overall transfer level Needs assistance  Equipment used Rolling walker (2 wheels);1 person hand held assist  Transfers Sit to/from Stand  Sit to Stand Min assist  General transfer comment can stand and maintain WB but will immediately begin to wgt it if he is walking  Ambulation/Gait  Ambulation/Gait assistance Min assist  Gait Distance (Feet) 1 Feet  Assistive device Rolling walker (2 wheels);1 person hand held assist  General Gait Details worked on control of RLE wb on RW, but pt is given mult attempts to show he can do it wth no success.  Pt can maintain NWB on RLE with scooting tasks, even to come up to stand.  However, cannot translate this to gait and had to sit him down  Balance  Overall balance assessment Needs assistance  Sitting-balance support Single extremity supported  Sitting balance-Leahy Scale Good  Standing balance support Bilateral upper extremity supported;During functional activity   Standing balance-Leahy Scale Poor  General Comments  General comments (skin integrity, edema, etc.) pt was seen for initiation of movement and noted his struggle with gait to keep wgt off RLE.  He is without a shoe to stabilize LLE and reports he does not want one.  THis decision was not fully explained, unsure if this is related to insurance coverage  PT - End of Session  Equipment Utilized During Treatment Gait belt  Activity Tolerance Patient limited by pain;Patient limited by fatigue  Patient left in bed;with call bell/phone within reach;with bed alarm set  Nurse Communication Mobility status  PT Assessment  PT Recommendation/Assessment Patient needs continued PT services  PT Visit Diagnosis Unsteadiness on feet (R26.81);Muscle weakness (generalized) (M62.81);Difficulty in walking, not elsewhere classified (R26.2)  PT Problem List Decreased strength;Decreased activity tolerance;Decreased balance;Decreased range of motion;Decreased mobility;Decreased coordination;Decreased knowledge of use of DME;Decreased safety awareness;Decreased skin integrity;Pain  Barriers to Discharge Inaccessible home environment;Decreased caregiver support  Barriers to Discharge Comments stairs to enter house and home alone  PT Plan  PT Frequency (ACUTE ONLY) Min 3X/week  PT Treatment/Interventions (ACUTE ONLY) DME instruction;Stair training;Gait training;Functional mobility training;Therapeutic activities;Therapeutic exercise;Balance training;Neuromuscular re-education;Patient/family education  AM-PAC PT "6 Clicks" Mobility Outcome Measure (Version 2)  Help needed turning from your back to your side while in a flat bed without using bedrails? 3  Help needed moving from lying on your back to sitting on the side of a flat bed without using bedrails? 3  Help needed moving to and from a bed to a chair (including a wheelchair)? 3  Help needed standing up from a chair using your arms (e.g., wheelchair or bedside chair)?  3  Help needed to walk in hospital room? 1  Help needed climbing 3-5 steps with a railing?  1  6 Click Score 14  Consider Recommendation of Discharge To: CIR/SNF/LTACH  Progressive Mobility  What is the highest level of mobility based on the progressive mobility assessment? Level 3 (Stands with assist) - Balance while standing  and cannot march in place  Mobility Out of bed to chair with meals  PT Recommendation  Follow Up Recommendations Skilled nursing-short term rehab (<3 hours/day)  Assistance recommended at discharge Frequent or constant Supervision/Assistance  Functional Status Assessment Patient has had a recent decline in their functional status and/or demonstrates limited ability to make significant improvements in function in a reasonable and predictable amount of time  PT equipment None recommended by PT  Individuals Consulted  Consulted and Agree with Results and Recommendations Patient  Acute Rehab PT Goals  Patient Stated Goal to get home and get walking  PT Goal Formulation With patient  Time For Goal Achievement 01/17/21  Potential to Achieve Goals Fair  PT Time Calculation  PT Start Time (ACUTE ONLY) 1320  PT Stop Time (ACUTE ONLY) 1359  PT Time Calculation (min) (ACUTE ONLY) 39 min  PT General Charges  $$ ACUTE PT VISIT 1 Visit  PT Evaluation  $PT Eval Moderate Complexity 1 Mod  PT Treatments  $Therapeutic Activity 23-37 mins  Written Expression  Dominant Hand Right    Mee Hives, PT PhD Acute Rehab Dept. Number: [place] [facility] [place]

## 2021-01-04 NOTE — Progress Notes (Signed)
Orthopedic Tech Progress Note Patient Details:  [name] 20-Apr-1955 [id number]  Patient ID: [name], male   DOB: [birth year], 65 y.o.   MRN: [id number]  [name] [name] 01/04/2021, 8:53 AM Called and spoke with RN, patient refused post op shoe.

## 2021-01-04 NOTE — Progress Notes (Signed)
Patient ID: [name], male   DOB: [birth year], 65 y.o.   MRN: [id number] Patient is postoperative day 1 transmetatarsal amputation.  The Prevena wound VAC is functioning well there is a small amount of drainage in the canister.  Discussed with the patient the importance of plugging and the pump to maintain the battery charged.  The Praveena pump was not plugged then.  Discussed the importance of minimizing weightbearing on the right foot.  Patient will need a wheelchair at home.  I will follow-up in the office in 1 week.

## 2021-01-04 NOTE — Telephone Encounter (Signed)
I called pt and he states that he does not remember why he called. I advised the pt that if he should need something to call and leave a message so that I can call him back with answerers.

## 2021-01-04 NOTE — Evaluation (Signed)
Occupational Therapy Evaluation Patient Details Name: [name] MRN: [id number] DOB: [birth year] Today's Date: 01/04/2021   History of Present Illness [name] is a 65 y.o. male who presents with ischemic ulcers plantar aspect of the toes on the right foot now s/p RLE transmetatarsal amputation. PHMx: HTN, DM, left transmet amp, back surgery.   Clinical Impression   This 65 yo male admitted with above presents to acute OT with PLOF of being Mod I (from W/C  and RW level) for basic ADLs and mobility (mainly WC per pt except to get to toilet in bathroom). Currently he is at a setup-Min A level for basic ADLs and transfers (but is not safe with either when it involves getting up on his feet). He will continue to benefit from acute OT with follow up on CIR first recommendation.      Recommendations for follow up therapy are one component of a multi-disciplinary discharge planning process, led by the attending physician.  Recommendations may be updated based on patient status, additional functional criteria and insurance authorization.   Follow Up Recommendations  Acute inpatient rehab (3hours/day)    Assistance Recommended at Discharge PRN  Functional Status Assessment  Patient has had a recent decline in their functional status and demonstrates the ability to make significant improvements in function in a reasonable and predictable amount of time.  Equipment Recommendations       Recommendations for Other Services Rehab consult     Precautions / Restrictions Precautions Precautions: Fall Restrictions Weight Bearing Restrictions: Yes RLE Weight Bearing: Touchdown weight bearing LLE Weight Bearing: Weight bearing as tolerated      Mobility Bed Mobility Overal bed mobility: Needs Assistance Bed Mobility: Supine to Sit     Supine to sit: Supervision;HOB elevated          Transfers Overall transfer level: Needs assistance Equipment used: Rolling walker (2  wheels) Transfers: Sit to/from Stand;Bed to chair/wheelchair/BSC Sit to Stand: Min assist   Squat pivot transfers: Min assist       General transfer comment: wasn't the safest ,but he was able to do it      Balance Overall balance assessment: Needs assistance Sitting-balance support: No upper extremity supported;Feet supported Sitting balance-Leahy Scale: Good     Standing balance support: Single extremity supported;Reliant on assistive device for balance Standing balance-Leahy Scale: Poor                             ADL either performed or assessed with clinical judgement   ADL Overall ADL's : Needs assistance/impaired Eating/Feeding: Independent;Sitting   Grooming: Set up;Sitting   Upper Body Bathing: Set up;Sitting   Lower Body Bathing: Minimal assistance;Sit to/from stand   Upper Body Dressing : Set up;Sitting   Lower Body Dressing: Minimal assistance;Sit to/from stand   Toilet Transfer: Minimal Public house manager Details (indicate cue type and reason): bed>recliner (non-drop arm). Wasn't the safest but he did it. Toileting- Clothing Manipulation and Hygiene: Minimal assistance;Sit to/from stand               Vision Patient Visual Report: No change from baseline              Pertinent Vitals/Pain Pain Assessment: Faces Faces Pain Scale: Hurts little more Pain Location: RLE--throbbing Pain Descriptors / Indicators: Aching;Sore;Throbbing Pain Intervention(s): Limited activity within patient's tolerance;Monitored during session;Repositioned     Hand Dominance Right   Extremity/Trunk Assessment Upper Extremity Assessment Upper Extremity Assessment: Overall  WFL for tasks assessed           Communication Communication Communication: No difficulties   Cognition Arousal/Alertness: Awake/alert Behavior During Therapy: WFL for tasks assessed/performed                                   General  Comments: decreased safety awareness of really protecting his RLE when he is standing and transferring                [place] expects to be discharged to:: Private residence Living Arrangements: Alone Available Help at Discharge: Family;Friend(s);Available PRN/intermittently Type of Home: House Home Access: Ramped entrance     Home Layout: One level;Full bath on main level     Bathroom Shower/Tub: Teacher, early years/pre: Standard     Home Equipment: Conservation officer, nature (2 wheels);Wheelchair - manual;BSC/3in1;Cane - single point          Prior Functioning/Environment Prior Level of Function : Independent/Modified Independent                        OT Problem List: Decreased strength;Impaired balance (sitting and/or standing);Pain      OT Treatment/Interventions: Self-care/ADL training;DME and/or AE instruction;Patient/family education;Balance training    OT Goals(Current goals can be found in the care plan section) Acute Rehab OT Goals Patient Stated Goal: to get his clothes on , brush his teeth, wash is face, maybe go to rehab before home OT Goal Formulation: With patient Time For Goal Achievement: 01/18/21 Potential to Achieve Goals: Good  OT Frequency: Min 2X/week   Barriers to D/C: Decreased caregiver support             AM-PAC OT "6 Clicks" Daily Activity     Outcome Measure Help from another person eating meals?: None Help from another person taking care of personal grooming?: A Little Help from another person toileting, which includes using toliet, bedpan, or urinal?: A Little Help from another person bathing (including washing, rinsing, drying)?: A Little Help from another person to put on and taking off regular upper body clothing?: A Little Help from another person to put on and taking off regular lower body clothing?: A Little 6 Click Score: 19   End of Session Equipment Utilized During Treatment: Gait  belt;Rolling walker (2 wheels) Nurse Communication: Mobility status (transfer to pt's left so he goes towards the non-operated side)  Activity Tolerance: Patient tolerated treatment well Patient left: in chair;with call bell/phone within reach;with chair alarm set  OT Visit Diagnosis: Unsteadiness on feet (R26.81);Other abnormalities of gait and mobility (R26.89);Muscle weakness (generalized) (M62.81);Pain Pain - Right/Left: Right Pain - part of body: Ankle and joints of foot                Time: 7672-0947 OT Time Calculation (min): 48 min Charges:  OT General Charges $OT Visit: 1 Visit OT Evaluation $OT Eval Moderate Complexity: 1 Mod OT Treatments $Self Care/Home Management : 23-37 mins  [place], OTR/L Acute [employer] Pager [phone] Office [phone]    Almon Register 01/04/2021, 12:04 PM

## 2021-01-04 NOTE — Care Management (Addendum)
Went to talk to patient about discharge plan.     Patient stated he received a wheel chair through he thinks [facility] 3 years ago and it is broken. He left it on the front porch and states " they can pick it up when they bring me a new one". Explained NCM will call [employer] but insurance only covers wheel chair every five years and current wheel chair would most likely need to be fixed. NCM explained NCM will call [employer] , and have [employer] call him regarding repairs. Patient upset wanting a new wheel chair. NCM confirmed patient's phone number . NCM also confirmed with [name] with [employer] wheel chair was provided through [employer] and insurance. [employer] will call patient regarding wheel chair repairs.   Patient currently upset over his lunch order, stated if he gets another salad he will throw it against the wall. He has talked to dietary and hung up on them.   NCM attempted to discuss discharge plan, however currently patient upset and using foul language.   Have asked PT/OT if they can see patient more frequently while in the hospital.   OT recommending CIR , will await determination.

## 2021-01-04 NOTE — Care Management (Signed)
°  °  Durable Medical Equipment  (From admission, onward)           Start     Ordered   01/04/21 0830  For home use only DME standard manual wheelchair with seat cushion  Once       Comments: Patient suffers from RIGHT TRANSMETATARSAL AMPUTATION which impairs their ability to perform daily activities like ambulating  in the home.  A cane  will not resolve issue with performing activities of daily living. A wheelchair will allow patient to safely perform daily activities. Patient can safely propel the wheelchair in the home or has a caregiver who can provide assistance. Length of need lifetime . Accessories: elevating leg rests (ELRs), wheel locks, extensions and anti-tippers.  Seat and back cushions   01/04/21 0830

## 2021-01-04 NOTE — Progress Notes (Signed)
PROGRESS NOTE  [name] TWS:[id number] DOB: 1955/10/27   PCP: [name] [place]  Patient is from: Home.   DOA: 12/28/2020 LOS: 7  Chief complaints:  No chief complaint on file.    Brief Narrative / Interim history: 65 year old M with PMH of right diabetic foot ulcer, NASH, left TMT amputation and HTN admitted to [facility] with right foot diabetic foot ulcer infection/cellulitis.  Started on broad-spectrum antibiotics and transferred to [facility] for TMT amputation by [name].  Underwent TMT amputation on 01/03/2021.  Therapy recommended CIR or SNF.  Subjective: Seen and examined earlier this morning.  Complains about pain in his right leg.  He says he has been waiting on his pain medication since earlier this morning.  He is also anxious about going home.  He states he lives alone.  Family lives about 6 hours away.   Objective: Vitals:   01/03/21 1557 01/03/21 2241 01/04/21 0439 01/04/21 0803  BP: (!) 131/95 (!) 149/97 140/78 131/79  Pulse: 100 90 78 82  Resp: 19 18 18 16   Temp: 98 F (36.7 C) 97.9 F (36.6 C) 97.6 F (36.4 C) 97.7 F (36.5 C)  TempSrc: Oral Oral Oral Oral  SpO2: 98% 97% 98% 99%  Weight:      Height:        Intake/Output Summary (Last 24 hours) at 01/04/2021 1434 Last data filed at 01/04/2021 1300 Gross per 24 hour  Intake 600 ml  Output 2500 ml  Net -1900 ml     Examination: GENERAL: No apparent distress.  Nontoxic. HEENT: MMM.  Vision and hearing grossly intact.  NECK: Supple.  No apparent JVD.  RESP: 99% on RA.  No IWOB.  Fair aeration bilaterally. CVS:  RRR. Heart sounds normal.  ABD/GI/GU: BS+. Abd soft, NTND.  MSK/EXT:  Moves extremities.  New right TMT and old left TMT amputation SKIN: no apparent skin lesion or wound NEURO: Awake and alert. Oriented appropriately.  No apparent focal neuro deficit. PSYCH: Calm.  Unhappy and somewhat upset  Procedures:  01/03/2021-right TMT amputation  Microbiology  summarized: [id number] and influenza PCR nonreactive. MRSA PCR screen negative. Blood cultures NGTD  Assessment & Plan: Right diabetic foot ulcer infection/cellulitis -S/p right TMT amputation by [name] on 01/03/2021 -Stop broad-spectrum antibiotics 24 hours postop.  Per surgery, source control achieved surgically. -Pain control per orthopedic surgery -Added bowel regimen -Continue PT/OT    Uncontrolled DM-2 with hyperglycemia and neuropathy: A1c 8.2%.  Injects Levemir 110 to 115 units twice daily at home. Recent Labs  Lab 01/03/21 2004 01/04/21 0013 01/04/21 0436 01/04/21 0805 01/04/21 1149  GLUCAP 373* 367* 192* 212* 276*  -Increase Levemir from 22 to 30 units twice daily -Increase NovoLog from 5 to 7 units 3 times daily with meals -Continue SSI-moderate -Continue statin and gabapentin  Anemia of acute illness: Stable Recent Labs    04/13/20 0549 04/15/20 0130 04/16/20 0314 04/17/20 0429 12/28/20 1727 12/29/20 0620 12/30/20 0443 01/01/21 0501 01/03/21 0628 01/04/21 0449  HGB 8.9* 8.3* 8.3* 8.2* 13.3 12.1* 10.9* 12.8* 13.2 12.0*  -Monitor intermittently  Essential hypertension: Normotensive. -Continue home metoprolol at reduced dose. -Continue holding home hydralazine -Hydralazine 25 mg TID PRN.   Non-anion gap metabolic acidosis-likely from IV fluid. -Continue monitoring  Lactic acidosis: Resolved.  Hyponatremia: Corrects to normal for hyperglycemia.   Hypokalemia: Resolved.  Body mass index is 28.31 kg/m.         DVT prophylaxis:  SCD's Start: 01/03/21 1037 heparin injection 5,000 Units Start:  12/29/20 0600  Code Status: Full code Family Communication: Patient and/or RN. Available if any question.  Level of care: Med-Surg Status is: Inpatient  Remains inpatient appropriate because: Safe disposition and inadequate pain control requiring IV pain meds  Final disposition: Likely home once cleared  Consultants:  Orthopedic surgery   Sch  Meds:  Scheduled Meds:  vitamin C  1,000 mg Oral Daily   colchicine  0.6 mg Oral BID   docusate sodium  100 mg Oral Daily   gabapentin  300 mg Oral TID   heparin  5,000 Units Subcutaneous Q8H   insulin aspart  0-15 Units Subcutaneous Q4H   insulin aspart  7 Units Subcutaneous TID WC   insulin detemir  30 Units Subcutaneous BID   metoprolol tartrate  12.5 mg Oral BID   nutrition supplement (JUVEN)  1 packet Oral BID BM   pantoprazole  40 mg Oral Daily   zinc sulfate  220 mg Oral Daily   Continuous Infusions:  cefTRIAXone (ROCEPHIN)  IV Stopped (01/03/21 1730)   metronidazole 500 mg (01/04/21 0819)   vancomycin 1,500 mg (01/04/21 0406)   PRN Meds:.acetaminophen, alum & mag hydroxide-simeth, amitriptyline, guaiFENesin-dextromethorphan, hydrALAZINE, HYDROmorphone (DILAUDID) injection, HYDROmorphone (DILAUDID) injection, methocarbamol, ondansetron, ondansetron **OR** [DISCONTINUED] ondansetron (ZOFRAN) IV, oxyCODONE, oxyCODONE, oxyCODONE, phenol, polyethylene glycol, potassium chloride, senna-docusate  Antimicrobials: Anti-infectives (From admission, onward)    Start     Dose/Rate Route Frequency Ordered Stop   01/03/21 0600  ceFAZolin (ANCEF) IVPB 2g/100 mL premix        2 g 200 mL/hr over 30 Minutes Intravenous On call to O.R. 01/02/21 1905 01/03/21 0836   01/01/21 0445  vancomycin (VANCOREADY) IVPB 1500 mg/300 mL       See Hyperspace for full Linked Orders Report.   1,500 mg 150 mL/hr over 120 Minutes Intravenous Every 12 hours 12/31/20 1531 01/04/21 2359   12/31/20 2200  vancomycin (VANCOREADY) IVPB 1500 mg/300 mL  Status:  Discontinued        1,500 mg 150 mL/hr over 120 Minutes Intravenous Every 12 hours 12/31/20 1015 12/31/20 1524   12/31/20 1630  vancomycin (VANCOREADY) IVPB 1500 mg/300 mL  Status:  Discontinued        1,500 mg 150 mL/hr over 120 Minutes Intravenous Every 12 hours 12/31/20 1524 12/31/20 1531   12/31/20 1615  vancomycin (VANCOREADY) IVPB 1250 mg/250 mL        See Hyperspace for full Linked Orders Report.   1,250 mg 166.7 mL/hr over 90 Minutes Intravenous  Once 12/31/20 1531 12/31/20 1630   12/29/20 2200  vancomycin (VANCOREADY) IVPB 1250 mg/250 mL  Status:  Discontinued        1,250 mg 166.7 mL/hr over 90 Minutes Intravenous Every 12 hours 12/29/20 1104 12/31/20 1015   12/29/20 1600  cefTRIAXone (ROCEPHIN) 2 g in sodium chloride 0.9 % 100 mL IVPB        2 g 200 mL/hr over 30 Minutes Intravenous Every 24 hours 12/28/20 2357 01/04/21 2359   12/29/20 1000  vancomycin (VANCOREADY) IVPB 1500 mg/300 mL  Status:  Discontinued        1,500 mg 150 mL/hr over 120 Minutes Intravenous Every 12 hours 12/28/20 2058 12/29/20 1104   12/28/20 2100  metroNIDAZOLE (FLAGYL) IVPB 500 mg        500 mg 100 mL/hr over 60 Minutes Intravenous Every 12 hours 12/28/20 2044     12/28/20 2100  vancomycin (VANCOCIN) IVPB 1000 mg/200 mL premix        1,000 mg  200 mL/hr over 60 Minutes Intravenous  Once 12/28/20 2058 12/28/20 2311   12/28/20 1915  vancomycin (VANCOCIN) IVPB 1000 mg/200 mL premix        1,000 mg 200 mL/hr over 60 Minutes Intravenous  Once 12/28/20 1909 12/28/20 2210   12/28/20 1915  cefTRIAXone (ROCEPHIN) 2 g in sodium chloride 0.9 % 100 mL IVPB        2 g 200 mL/hr over 30 Minutes Intravenous  Once 12/28/20 1909 12/28/20 2104        I have personally reviewed the following labs and images: CBC: Recent Labs  Lab 12/29/20 0620 12/30/20 0443 01/01/21 0501 01/03/21 0628 01/04/21 0449  WBC 8.9 6.2 7.3 8.7 9.9  HGB 12.1* 10.9* 12.8* 13.2 12.0*  HCT 34.0* 31.2* 34.6* 37.4* 32.6*  MCV 93.2 92.9 90.6 90.6 88.8  PLT 148* 132* 181 215 249   BMP &GFR Recent Labs  Lab 12/29/20 0620 12/30/20 0443 01/01/21 0501 01/03/21 0628 01/04/21 0449  NA 134* 135 132* 131* 129*  K 3.5 3.4* 3.9 3.7 4.0  CL 100 101 99 98 99  CO2 26 25 26 25  21*  GLUCOSE 217* 171* 205* 189* 185*  BUN 18 14 9 20  33*  CREATININE 1.03 0.93 1.06 0.97 1.03  CALCIUM 8.5* 8.4*  8.7* 8.6* 8.5*  MG  --   --   --   --  1.8  PHOS  --   --   --   --  2.8   Estimated Creatinine Clearance: 92.7 mL/min (by C-G formula based on SCr of 1.03 mg/dL). Liver & Pancreas: Recent Labs  Lab 12/28/20 1727 01/04/21 0449  AST 17  --   ALT 18  --   ALKPHOS 113  --   BILITOT 1.1  --   PROT 8.0  --   ALBUMIN 4.1 2.8*   No results for input(s): LIPASE, AMYLASE in the last 168 hours. No results for input(s): AMMONIA in the last 168 hours. Diabetic: No results for input(s): HGBA1C in the last 72 hours. Recent Labs  Lab 01/03/21 2004 01/04/21 0013 01/04/21 0436 01/04/21 0805 01/04/21 1149  GLUCAP 373* 367* 192* 212* 276*   Cardiac Enzymes: No results for input(s): CKTOTAL, CKMB, CKMBINDEX, TROPONINI in the last 168 hours. No results for input(s): PROBNP in the last 8760 hours. Coagulation Profile: No results for input(s): INR, PROTIME in the last 168 hours. Thyroid Function Tests: No results for input(s): TSH, T4TOTAL, FREET4, T3FREE, THYROIDAB in the last 72 hours. Lipid Profile: No results for input(s): CHOL, HDL, LDLCALC, TRIG, CHOLHDL, LDLDIRECT in the last 72 hours. Anemia Panel: No results for input(s): VITAMINB12, FOLATE, FERRITIN, TIBC, IRON, RETICCTPCT in the last 72 hours. Urine analysis:    Component Value Date/Time   COLORURINE YELLOW 04/13/2020 0035   APPEARANCEUR CLOUDY (A) 04/13/2020 0035   LABSPEC 1.016 04/13/2020 0035   PHURINE 5.0 04/13/2020 0035   GLUCOSEU 150 (A) 04/13/2020 0035   HGBUR MODERATE (A) 04/13/2020 0035   BILIRUBINUR NEGATIVE 04/13/2020 0035   KETONESUR 80 (A) 04/13/2020 0035   PROTEINUR NEGATIVE 04/13/2020 0035   NITRITE NEGATIVE 04/13/2020 0035   LEUKOCYTESUR LARGE (A) 04/13/2020 0035   Sepsis Labs: Invalid input(s): PROCALCITONIN, [place]  Microbiology: Recent Results (from the past 240 hour(s))  Blood culture (routine x 2)     Status: None   Collection Time: 12/28/20  5:27 PM   Specimen: BLOOD  Result Value Ref  Range Status   Specimen Description BLOOD BLOOD RIGHT ARM  Final  Special Requests   Final    BOTTLES DRAWN AEROBIC AND ANAEROBIC Blood Culture adequate volume   Culture   Final    NO GROWTH 6 DAYS Performed at [facility], [address], [place], [place] [address]    Report Status 01/03/2021 FINAL  Final  Blood culture (routine x 2)     Status: None   Collection Time: 12/28/20  5:27 PM   Specimen: BLOOD  Result Value Ref Range Status   Specimen Description BLOOD BLOOD LEFT ARM  Final   Special Requests   Final    BOTTLES DRAWN AEROBIC AND ANAEROBIC Blood Culture adequate volume   Culture   Final    NO GROWTH 6 DAYS Performed at [facility], [address], [place], [place] [address]    Report Status 01/03/2021 FINAL  Final  Resp Panel by RT-PCR (Flu A&B, Covid) Nasopharyngeal Swab     Status: None   Collection Time: 12/28/20  7:18 PM   Specimen: Nasopharyngeal Swab; Nasopharyngeal(NP) swabs in vial transport medium  Result Value Ref Range Status   SARS Coronavirus 2 by RT PCR NEGATIVE NEGATIVE Final    Comment: (NOTE) SARS-CoV-2 target nucleic acids are NOT DETECTED.  The SARS-CoV-2 RNA is generally detectable in upper respiratory specimens during the acute phase of infection. The lowest concentration of SARS-CoV-2 viral copies this assay can detect is 138 copies/mL. A negative result does not preclude SARS-Cov-2 infection and should not be used as the sole basis for treatment or other patient management decisions. A negative result may occur with  improper specimen collection/handling, submission of specimen other than nasopharyngeal swab, presence of viral mutation(s) within the areas targeted by this assay, and inadequate number of viral copies(<138 copies/mL). A negative result must be combined with clinical observations, patient history, and epidemiological information. The expected result is Negative.  Fact Sheet for Patients:   EntrepreneurPulse.com.au  Fact Sheet for Healthcare Providers:  IncredibleEmployment.be  This test is no t yet approved or cleared by the [place] FDA and  has been authorized for detection and/or diagnosis of SARS-CoV-2 by FDA under an Emergency Use Authorization (EUA). This EUA will remain  in effect (meaning this test can be used) for the duration of the COVID-19 declaration under Section 564(b)(1) of the Act, 21 U.S.C.section 360bbb-3(b)(1), unless the authorization is terminated  or revoked sooner.       Influenza A by PCR NEGATIVE NEGATIVE Final   Influenza B by PCR NEGATIVE NEGATIVE Final    Comment: (NOTE) The Xpert Xpress SARS-CoV-2/FLU/RSV plus assay is intended as an aid in the diagnosis of influenza from Nasopharyngeal swab specimens and should not be used as a sole basis for treatment. Nasal washings and aspirates are unacceptable for Xpert Xpress SARS-CoV-2/FLU/RSV testing.  Fact Sheet for Patients: EntrepreneurPulse.com.au  Fact Sheet for Healthcare Providers: IncredibleEmployment.be  This test is not yet approved or cleared by the [place] FDA and has been authorized for detection and/or diagnosis of SARS-CoV-2 by FDA under an Emergency Use Authorization (EUA). This EUA will remain in effect (meaning this test can be used) for the duration of the COVID-19 declaration under Section 564(b)(1) of the Act, 21 U.S.C. section 360bbb-3(b)(1), unless the authorization is terminated or revoked.  Performed at [facility], [address] [place], [place] [address]   MRSA Next Gen by PCR, Nasal     Status: None   Collection Time: 01/02/21 12:05 PM   Specimen: Nasal Mucosa; Nasal Swab  Result Value Ref Range Status  MRSA by PCR Next Gen NOT DETECTED NOT DETECTED Final    Comment: (NOTE) The GeneXpert MRSA Assay (FDA approved for NASAL specimens only), is one component of a  comprehensive MRSA colonization surveillance program. It is not intended to diagnose MRSA infection nor to guide or monitor treatment for MRSA infections. Test performance is not FDA approved in patients less than 25 years old. Performed at [facility], [place] [address] [place], [place] [address]     Radiology Studies: No results found.    Damichael Hofman T. [place]  If 7PM-7AM, please contact night-coverage [email or web address] 01/04/2021, 2:34 PM

## 2021-01-05 ENCOUNTER — Telehealth: Payer: Self-pay

## 2021-01-05 LAB — RENAL FUNCTION PANEL
Albumin: 2.6 g/dL — ABNORMAL LOW (ref 3.5–5.0)
Anion gap: 7 (ref 5–15)
BUN: 30 mg/dL — ABNORMAL HIGH (ref 8–23)
CO2: 23 mmol/L (ref 22–32)
Calcium: 8.3 mg/dL — ABNORMAL LOW (ref 8.9–10.3)
Chloride: 106 mmol/L (ref 98–111)
Creatinine, Ser: 0.97 mg/dL (ref 0.61–1.24)
GFR, Estimated: >60 mL/min (ref >60)
Glucose, Bld: 165 mg/dL — ABNORMAL HIGH (ref 70–99)
Phosphorus: 2.8 mg/dL (ref 2.5–4.6)
Potassium: 3.5 mmol/L (ref 3.5–5.1)
Sodium: 136 mmol/L (ref 135–145)

## 2021-01-05 LAB — CBC
HCT: 30.1 % — ABNORMAL LOW (ref 39.0–52.0)
Hemoglobin: 10.6 g/dL — ABNORMAL LOW (ref 13.0–17.0)
MCH: 32.2 pg (ref 26.0–34.0)
MCHC: 35.2 g/dL (ref 30.0–36.0)
MCV: 91.5 fL (ref 80.0–100.0)
Platelets: 223 10*3/uL (ref 150–400)
RBC: 3.29 MIL/uL — ABNORMAL LOW (ref 4.22–5.81)
RDW: 13.2 % (ref 11.5–15.5)
WBC: 7.1 10*3/uL (ref 4.0–10.5)
nRBC: 0 % (ref 0.0–0.2)

## 2021-01-05 LAB — GLUCOSE, CAPILLARY
Glucose-Capillary: 108 mg/dL — ABNORMAL HIGH (ref 70–99)
Glucose-Capillary: 154 mg/dL — ABNORMAL HIGH (ref 70–99)
Glucose-Capillary: 161 mg/dL — ABNORMAL HIGH (ref 70–99)
Glucose-Capillary: 162 mg/dL — ABNORMAL HIGH (ref 70–99)
Glucose-Capillary: 198 mg/dL — ABNORMAL HIGH (ref 70–99)

## 2021-01-05 LAB — MAGNESIUM: Magnesium: 1.8 mg/dL (ref 1.7–2.4)

## 2021-01-05 MED ORDER — INSULIN DETEMIR 100 UNIT/ML ~~LOC~~ SOLN: 45.0000 [IU] | Freq: Two times a day (BID) | SUBCUTANEOUS | 1 refills | Status: AC

## 2021-01-05 MED ORDER — ASPIRIN EC 81 MG PO TBEC: 81.0000 mg | DELAYED_RELEASE_TABLET | Freq: Two times a day (BID) | ORAL | 0 refills | Status: AC

## 2021-01-05 MED ORDER — SENNOSIDES-DOCUSATE SODIUM 8.6-50 MG PO TABS: 1.0000 | ORAL_TABLET | Freq: Two times a day (BID) | ORAL | Status: AC | PRN

## 2021-01-05 MED ORDER — POTASSIUM CHLORIDE CRYS ER 20 MEQ PO TBCR
40.0000 meq | EXTENDED_RELEASE_TABLET | Freq: Once | ORAL | Status: AC
  Administered 2021-01-05: 08:00:00 40 meq via ORAL
  Filled 2021-01-05: qty 2

## 2021-01-05 NOTE — Progress Notes (Signed)
OT Cancellation Note  Patient Details Name: [name] MRN: [id number] DOB: 1955-12-19   Cancelled Treatment:    Reason Eval/Treat Not Completed: Patient declined, states he is going home.  OT to follow if he remains.    [name] 01/05/2021, 4:16 PM

## 2021-01-05 NOTE — Discharge Summary (Signed)
Physician Discharge Summary  [place] HKV:[id number] DOB: [birth year] DOA: 12/28/2020  PCP: [facility], [place] date: 12/28/2020 Discharge date: 01/05/2021 Admitted From: Home Disposition: Home Recommendations for Outpatient Follow-up:  Follow ups as below. Please obtain CBC/BMP/Mag at follow up Please follow up on the following pending results: None Home Health: PT/OT/RN/aide Equipment/Devices: Wheelchair Discharge Condition: Stable CODE STATUS: Full code  Follow-up Information     [name], MD Follow up in 1 week(s).   Specialty: Orthopedic Surgery Contact information: [place] [address] [phone]         [facility], [facility]. Schedule an appointment as soon as possible for a visit in 1 week(s).   Contact information: [place] [address] [phone]                Hospital Course: 65 year old M with PMH of right diabetic foot ulcer, NASH, left TMT amputation and HTN admitted to [facility] with right foot diabetic foot ulcer infection/cellulitis.  Started on broad-spectrum antibiotics and transferred to [facility] for TMT amputation by [name].  Underwent TMT amputation with wound VAC placement and infectious source control on 01/03/2021.  Antibiotics discontinued on 01/04/2021.  Therapy recommended SNF.  Initially, patient was in agreement with SNF but he changed his mind and chose to go home with home health and DME.  Outpatient follow-up with PCP and orthopedic surgery as above.  See individual problem list below for more on hospital course.  Discharge Diagnoses:  Right diabetic foot ulcer infection/cellulitis -S/p right TMT amputation and wound VAC placement by [name] on 01/03/2021 -Per surgery, source control achieved surgically, and antibiotics discontinued 24 hours postop -Patient will be touchdown weightbearing on RLE -He is discharged with portable wound  VAC -Patient is already on Flexeril and oxycodone -Continue bowel regimen -Aspirin 81 mg twice daily for 1 months -[facility] PT/OT/RN/aide/wheelchair ordered.     Uncontrolled DM-2 with hyperglycemia and neuropathy: A1c 8.2%.  Injects Levemir 110 to 115 units twice daily at home. Recent Labs  Lab 01/04/21 2008 01/05/21 0004 01/05/21 0445 01/05/21 0732 01/05/21 1101  GLUCAP 229* 198* 161* 108* 154*  -Levemir 45 units twice daily -Continue home low-dose glipizide -Continue statin and gabapentin    Anemia of acute illness: H&H relatively stable Recent Labs    04/15/20 0130 04/16/20 0314 04/17/20 0429 12/28/20 1727 12/29/20 0620 12/30/20 0443 01/01/21 0501 01/03/21 0628 01/04/21 0449 01/05/21 0444  HGB 8.3* 8.3* 8.2* 13.3 12.1* 10.9* 12.8* 13.2 12.0* 10.6*  -Recheck CBC at follow-up   Essential hypertension: Normotensive. -Continue home meds   Non-anion gap metabolic acidosis-likely from IV fluid.  Resolved.   Lactic acidosis: Resolved.   Hyponatremia: Resolved.   Hypokalemia: K3.5. -P.o. KCl 40x1 prior to discharge   Body mass index is 28.31 kg/m.           Discharge Exam: Vitals:   01/04/21 1538 01/04/21 2002 01/05/21 0435 01/05/21 0735  BP: 120/83 132/76 120/71 134/78  Pulse: 93 83 68 79  Temp: 98 F (36.7 C) 97.9 F (36.6 C) 97.9 F (36.6 C) 98.2 F (36.8 C)  Resp: 17 18 18 18   Height:      Weight:      SpO2: 99% 99% 97% 100%  TempSrc: Oral Oral Oral Oral  BMI (Calculated):         GENERAL: No apparent distress.  Nontoxic. HEENT: MMM.  Vision and hearing grossly intact.  NECK: Supple.  No apparent JVD.  RESP: 100% on RA.  No IWOB.  Fair aeration bilaterally. CVS:  RRR. Heart sounds normal.  ABD/GI/GU: BS+. Abd soft, NTND.  MSK/EXT:  Moves extremities.  New R TMT amputation and old L TMT amputation SKIN: no apparent skin lesion or wound NEURO: Awake and alert. Oriented appropriately.  No apparent focal neuro deficit. PSYCH: Calm. Normal  affect.     Discharge Instructions  Discharge Instructions     Call MD for:  extreme fatigue   Complete by: As directed    Call MD for:  persistant dizziness or light-headedness   Complete by: As directed    Call MD for:  redness, tenderness, or signs of infection (pain, swelling, redness, odor or green/yellow discharge around incision site)   Complete by: As directed    Call MD for:  severe uncontrolled pain   Complete by: As directed    Call MD for:  temperature >100.4   Complete by: As directed    Diet - low sodium heart healthy   Complete by: As directed    Diet Carb Modified   Complete by: As directed    Discharge instructions   Complete by: As directed    It has been a pleasure taking care of you!  You were hospitalized with diabetic right foot infection for which you have been treated surgically medically.  Please follow instructions by your surgeon about wound care and weightbearing.  We made adjustment to your diabetic medication.  Please review your new medication list and the directions on your medications before you take them. Follow-up with your primary care doctor in 1 to 2 weeks or sooner if needed. Follow-up with your surgeon as recommended to you.   Take care,   Discharge wound care:   Complete by: As directed    Leave dressing on until follow-up with your surgeon.   Increase activity slowly   Complete by: As directed    Negative Pressure Wound Therapy - Incisional   Complete by: As directed    Touch down weight bearing   Complete by: As directed    Laterality: right   Extremity: Lower      Allergies as of 01/05/2021       Reactions   Other Hives   Anti-snake venim for copperhead venom.   Pravastatin Other (See Comments)   Leg cramps, elevated CK   Prednisone Other (See Comments)   Patient states "it runs my blood sugar up so I don't take it."   Toradol [ketorolac Tromethamine] Hives, Nausea And Vomiting   Reaction to injection         Medication List     TAKE these medications    acetaminophen 500 MG tablet Commonly known as: TYLENOL Take 500 mg by mouth every 6 (six) hours as needed for headache (pain).   allopurinol 100 MG tablet Commonly known as: ZYLOPRIM TAKE 1 TABLET BY MOUTH TWICE A DAY What changed:  when to take this reasons to take this   amitriptyline 25 MG tablet Commonly known as: ELAVIL Take 25 mg by mouth at bedtime as needed for sleep.   amLODipine 10 MG tablet Commonly known as: NORVASC Take 10 mg by mouth at bedtime.   aspirin EC 81 MG tablet Take 1 tablet (81 mg total) by mouth in the morning and at bedtime. Swallow whole. What changed:  medication strength how much to take when to take this additional instructions   cyanocobalamin 1000 MCG tablet Take 1 tablet (1,000 mcg total) by mouth  daily.   cyclobenzaprine 10 MG tablet Commonly known as: FLEXERIL Take 10 mg by mouth at bedtime.   ferrous sulfate 325 (65 FE) MG tablet Take 1 tablet (325 mg total) by mouth daily.   folic acid 1 MG tablet Commonly known as: FOLVITE Take 1 tablet (1 mg total) by mouth daily.   gabapentin 300 MG capsule Commonly known as: NEURONTIN Take 300 mg by mouth 3 (three) times daily.   glipiZIDE 2.5 MG 24 hr tablet Commonly known as: GLUCOTROL XL Take 2.5 mg by mouth every morning.   insulin detemir 100 UNIT/ML injection Commonly known as: LEVEMIR Inject 0.45 mLs (45 Units total) into the skin 2 (two) times daily. What changed:  how much to take when to take this reasons to take this   meloxicam 15 MG tablet Commonly known as: MOBIC Take 15 mg by mouth at bedtime.   metoprolol succinate 100 MG 24 hr tablet Commonly known as: TOPROL-XL Take 100 mg by mouth every morning.   multivitamin with minerals Tabs tablet Take 1 tablet by mouth daily.   oxyCODONE 15 MG immediate release tablet Commonly known as: ROXICODONE Take 15 mg by mouth 4 (four) times daily. What changed: Another  medication with the same name was removed. Continue taking this medication, and follow the directions you see here.   polyethylene glycol 17 g packet Commonly known as: MIRALAX / GLYCOLAX Take 17 g by mouth daily as needed for mild constipation.   senna-docusate 8.6-50 MG tablet Commonly known as: Senokot-S Take 1 tablet by mouth 2 (two) times daily as needed for moderate constipation.   thiamine 100 MG tablet Take 1 tablet (100 mg total) by mouth daily.               Durable Medical Equipment  (From admission, onward)           Start     Ordered   01/04/21 0830  For home use only DME standard manual wheelchair with seat cushion  Once       Comments: Patient suffers from RIGHT TRANSMETATARSAL AMPUTATION which impairs their ability to perform daily activities like ambulating  in the home.  A cane  will not resolve issue with performing activities of daily living. A wheelchair will allow patient to safely perform daily activities. Patient can safely propel the wheelchair in the home or has a caregiver who can provide assistance. Length of need lifetime . Accessories: elevating leg rests (ELRs), wheel locks, extensions and anti-tippers.  Seat and back cushions   01/04/21 0830              Discharge Care Instructions  (From admission, onward)           Start     Ordered   01/05/21 0000  Discharge wound care:       Comments: Leave dressing on until follow-up with your surgeon.   01/05/21 1512   01/04/21 0000  Touch down weight bearing       Question Answer Comment  Laterality right   Extremity Lower      01/04/21 0730            Consultations: Orthopedic surgery  Procedures/Studies: 01/03/2021-right TMT amputation and wound VAC placement by [name]   CT FOOT RIGHT W CONTRAST  Result Date: 12/28/2020 CLINICAL DATA:  Foot pain swelling blisters EXAM: CT OF THE LOWER RIGHT EXTREMITY WITH CONTRAST TECHNIQUE: Multidetector CT imaging of the lower  right extremity was performed according to the  standard protocol following intravenous contrast administration. CONTRAST:  57m OMNIPAQUE IOHEXOL 300 MG/ML  SOLN COMPARISON:  Radiograph 12/28/2020 FINDINGS: Bones/Joint/Cartilage No acute fracture or malalignment. No significant ankle effusion. No gross osseous destructive change. Moderate intertarsal degenerative changes with scattered small erosions. Degenerative changes with joint space narrowing at the MTP and IP joints. Small erosion base of first proximal phalanx. Ligaments Suboptimally assessed by CT. Muscles and Tendons No intramuscular fluid collections. Achilles tendon appears intact. Bulky plantar fascia calcifications. There is mild atrophy of the plantar muscles. Soft tissues Skin thickening and considerable soft tissue edema. Fluid at the distal plantar aspect of the foot without rim enhancing abscess collection. Surface irregularity at the plantar aspect of the distal foot skin surface could relate to history of blisters. No soft tissue emphysema. IMPRESSION: 1. Considerable skin thickening and soft tissue edema, suspect for cellulitis. No rim enhancing fluid collections to suggest soft tissue abscess at this time. 2. No frank osseous destructive change to suggest osteomyelitis. Moderate degenerative changes are present at the tarsal bones, TMT and MTP joints as well as the digits of the foot. There are scattered erosions. Findings could be secondary to inflammatory arthritis though early changes of neuropathic joint disease could also be considered. If clinical suspicion for osteomyelitis remains high, would further evaluate with MRI. Electronically Signed   By: KDonavan FoilM.D.   On: 12/28/2020 22:12   [place] ABI (SCREENING LOWER EXTREMITY)  Result Date: 12/29/2020 CLINICAL DATA:  Right foot blisters for 2 days. Prior left trans metatarsal amputation. EXAM: NONINVASIVE PHYSIOLOGIC VASCULAR STUDY OF BILATERAL LOWER EXTREMITIES TECHNIQUE:  Evaluation of both lower extremities were performed at rest, including calculation of ankle-brachial indices with single level Doppler, pressure and pulse volume recording. COMPARISON:  None. FINDINGS: Right ABI:  1.19 Left ABI:  1.27 Right Lower Extremity: Posterior tibial pulse was not detected. Monophasic waveform seen in the dorsalis pedis. Left Lower Extremity: Posterior tibial pulse was not detected. Monophasic waveform seen in the dorsalis pedis. 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 Degree of arterial stenosis can be better evaluated with CT angiography. Electronically Signed   By: FMiachel RouxM.D.   On: 12/29/2020 12:35   DG Foot Complete Right  Result Date: 12/28/2020 CLINICAL DATA:  Pain and swelling EXAM: RIGHT FOOT COMPLETE - 3+ VIEW COMPARISON:  03/28/2017 FINDINGS: No recent fracture or dislocation is seen. There are no focal lytic lesions. Degenerative changes are noted in first metatarsophalangeal joint and interphalangeal joint of the big toe. Bony spurs seen in the dorsal aspect of intertarsal and tarsometatarsal joints. Plantar spur is seen in the calcaneus. There are coarse calcifications in the region of plantar fascia. Arterial calcifications are seen in the soft tissues. There is marked soft tissue swelling over the dorsum. IMPRESSION: No recent fracture or dislocation is seen. There are no focal lytic lesions. If there is clinical suspicion for osteomyelitis, follow-up three-phase bone scan or MRI may be considered. Other findings as described in the body of the report. Electronically Signed   By: PElmer PickerM.D.   On: 12/28/2020 17:08       The results of significant diagnostics from this hospitalization (including imaging, microbiology, ancillary and laboratory) are  listed below for reference.     Microbiology: Recent Results (from the past 240 hour(s))  Blood culture (routine x 2)  Status: None   Collection Time: 12/28/20  5:27 PM   Specimen: BLOOD  Result Value Ref Range Status   Specimen Description BLOOD BLOOD RIGHT ARM  Final   Special Requests   Final    BOTTLES DRAWN AEROBIC AND ANAEROBIC Blood Culture adequate volume   Culture   Final    NO GROWTH 6 DAYS Performed at [facility], [address] [place], [place] [address]    Report Status 01/03/2021 FINAL  Final  Blood culture (routine x 2)     Status: None   Collection Time: 12/28/20  5:27 PM   Specimen: BLOOD  Result Value Ref Range Status   Specimen Description BLOOD BLOOD LEFT ARM  Final   Special Requests   Final    BOTTLES DRAWN AEROBIC AND ANAEROBIC Blood Culture adequate volume   Culture   Final    NO GROWTH 6 DAYS Performed at [facility], [address], [place], [place] [address]    Report Status 01/03/2021 FINAL  Final  Resp Panel by RT-PCR (Flu A&B, Covid) Nasopharyngeal Swab     Status: None   Collection Time: 12/28/20  7:18 PM   Specimen: Nasopharyngeal Swab; Nasopharyngeal(NP) swabs in vial transport medium  Result Value Ref Range Status   SARS Coronavirus 2 by RT PCR NEGATIVE NEGATIVE Final    Comment: (NOTE) SARS-CoV-2 target nucleic acids are NOT DETECTED.  The SARS-CoV-2 RNA is generally detectable in upper respiratory specimens during the acute phase of infection. The lowest concentration of SARS-CoV-2 viral copies this assay can detect is 138 copies/mL. A negative result does not preclude SARS-Cov-2 infection and should not be used as the sole basis for treatment or other patient management decisions. A negative result may occur with  improper specimen collection/handling, submission of specimen other than nasopharyngeal swab, presence of viral mutation(s) within the areas targeted by this assay, and inadequate number of viral copies(<138 copies/mL).  A negative result must be combined with clinical observations, patient history, and epidemiological information. The expected result is Negative.  Fact Sheet for Patients:  EntrepreneurPulse.com.au  Fact Sheet for Healthcare Providers:  IncredibleEmployment.be  This test is no t yet approved or cleared by the [place] FDA and  has been authorized for detection and/or diagnosis of SARS-CoV-2 by FDA under an Emergency Use Authorization (EUA). This EUA will remain  in effect (meaning this test can be used) for the duration of the COVID-19 declaration under Section 564(b)(1) of the Act, 21 U.S.C.section 360bbb-3(b)(1), unless the authorization is terminated  or revoked sooner.       Influenza A by PCR NEGATIVE NEGATIVE Final   Influenza B by PCR NEGATIVE NEGATIVE Final    Comment: (NOTE) The Xpert Xpress SARS-CoV-2/FLU/RSV plus assay is intended as an aid in the diagnosis of influenza from Nasopharyngeal swab specimens and should not be used as a sole basis for treatment. Nasal washings and aspirates are unacceptable for Xpert Xpress SARS-CoV-2/FLU/RSV testing.  Fact Sheet for Patients: EntrepreneurPulse.com.au  Fact Sheet for Healthcare Providers: IncredibleEmployment.be  This test is not yet approved or cleared by the [place] FDA and has been authorized for detection and/or diagnosis of SARS-CoV-2 by FDA under an Emergency Use Authorization (EUA). This EUA will remain in effect (meaning this test can be used) for the duration of the COVID-19 declaration under Section 564(b)(1) of the Act, 21 U.S.C. section 360bbb-3(b)(1), unless the authorization is terminated or revoked.  Performed at [facility], [address], [place], [place] [address]   MRSA  Next Gen by PCR, Nasal     Status: None   Collection Time: 01/02/21 12:05 PM   Specimen: Nasal Mucosa; Nasal Swab  Result Value Ref Range Status    MRSA by PCR Next Gen NOT DETECTED NOT DETECTED Final    Comment: (NOTE) The GeneXpert MRSA Assay (FDA approved for NASAL specimens only), is one component of a comprehensive MRSA colonization surveillance program. It is not intended to diagnose MRSA infection nor to guide or monitor treatment for MRSA infections. Test performance is not FDA approved in patients less than 47 years old. Performed at [facility], [place] [address], [place], [place] [address]      Labs:  CBC: Recent Labs  Lab 12/30/20 [id number] 01/01/21 0501 01/03/21 0628 01/04/21 0449 01/05/21 0444  WBC 6.2 7.3 8.7 9.9 7.1  HGB 10.9* 12.8* 13.2 12.0* 10.6*  HCT 31.2* 34.6* 37.4* 32.6* 30.1*  MCV 92.9 90.6 90.6 88.8 91.5  PLT 132* 181 215 249 223   BMP &GFR Recent Labs  Lab 12/30/20 0443 01/01/21 0501 01/03/21 0628 01/04/21 0449 01/05/21 0444  NA 135 132* 131* 129* 136  K 3.4* 3.9 3.7 4.0 3.5  CL 101 99 98 99 106  CO2 25 26 25  21* 23  GLUCOSE 171* 205* 189* 185* 165*  BUN 14 9 20  33* 30*  CREATININE 0.93 1.06 0.97 1.03 0.97  CALCIUM 8.4* 8.7* 8.6* 8.5* 8.3*  MG  --   --   --  1.8 1.8  PHOS  --   --   --  2.8 2.8   Estimated Creatinine Clearance: 98.5 mL/min (by C-G formula based on SCr of 0.97 mg/dL). Liver & Pancreas: Recent Labs  Lab 01/04/21 0449 01/05/21 0444  ALBUMIN 2.8* 2.6*   No results for input(s): LIPASE, AMYLASE in the last 168 hours. No results for input(s): AMMONIA in the last 168 hours. Diabetic: No results for input(s): HGBA1C in the last 72 hours. Recent Labs  Lab 01/04/21 2008 01/05/21 0004 01/05/21 0445 01/05/21 0732 01/05/21 1101  GLUCAP 229* 198* 161* 108* 154*   Cardiac Enzymes: No results for input(s): CKTOTAL, CKMB, CKMBINDEX, TROPONINI in the last 168 hours. No results for input(s): PROBNP in the last 8760 hours. Coagulation Profile: No results for input(s): INR, PROTIME in the last 168 hours. Thyroid Function Tests: No results for input(s): TSH,  T4TOTAL, FREET4, T3FREE, THYROIDAB in the last 72 hours. Lipid Profile: No results for input(s): CHOL, HDL, LDLCALC, TRIG, CHOLHDL, LDLDIRECT in the last 72 hours. Anemia Panel: No results for input(s): VITAMINB12, FOLATE, FERRITIN, TIBC, IRON, RETICCTPCT in the last 72 hours. Urine analysis:    Component Value Date/Time   COLORURINE YELLOW 04/13/2020 0035   APPEARANCEUR CLOUDY (A) 04/13/2020 0035   LABSPEC 1.016 04/13/2020 0035   PHURINE 5.0 04/13/2020 0035   GLUCOSEU 150 (A) 04/13/2020 0035   HGBUR MODERATE (A) 04/13/2020 0035   BILIRUBINUR NEGATIVE 04/13/2020 0035   KETONESUR 80 (A) 04/13/2020 0035   PROTEINUR NEGATIVE 04/13/2020 0035   NITRITE NEGATIVE 04/13/2020 0035   LEUKOCYTESUR LARGE (A) 04/13/2020 0035   Sepsis Labs: Invalid input(s): PROCALCITONIN, LACTICIDVEN   Time coordinating discharge: 45 minutes  SIGNED:  Mercy Riding, MD  [facility] 01/05/2021, 4:07 PM

## 2021-01-05 NOTE — Progress Notes (Signed)
PT Cancellation Note  Patient Details Name: [name] MRN: [id number] DOB: [birth year]   Cancelled Treatment:    Reason Eval/Treat Not Completed: Other (comment).  Pt is refusing therapy, anticipates leaving today despite the recommendation of SNF and his report of broken equipment.  He is having a difficult time with details to leave, expecting to go home alone.  PT continues to recommend SNF since pt cannot control WB on RLE and is in inaccessible home.   Ramond Dial 01/05/2021, 2:03 PM  Mee Hives, PT PhD Acute Rehab Dept. Number: [place] [facility] [place]

## 2021-01-05 NOTE — TOC Transition Note (Signed)
Transition of Care [facility]) - CM/SW Discharge Note   Patient Details  Name: [name] MRN: [id number] Date of Birth: Feb 28, 1955  [facility] CM/SW Contact:  [name], [name], RN Phone Number: 01/05/2021, 4:38 PM   Clinical Narrative:    CM notified by patient's nurse that he is requesting to go home instead of rehab as recommended by PT/OT. Cm spoke with patient at bedside. Patient very agitated and threatening to leave AMA and call his attorney if he is not discharged. Patient requesting another wheelchair as the one he has is broken. CM called and spoke with [name] with [employer] and she states patient cannot be provided with another wheelchair because he received one less than 5 yrs and insurance will not pay if less than 5 yrs. CM call and spoke with [name] with [employer] and he also states insurance will not pay for wheelchair. TOC was able to get patient a wheelchair. Home health services ordered by MD and CM attempted to arrange services and called and spoke with [name] [facility]), [name] [facility]), [name]) [name] [facility] all states they do not have enough staffing to cover [place]. CM called and spoke with Hoyle Sauer with [facility] and she states they do not cover [place] area. CM notified Supervisor and MD.  Corey Harold and [facility] cannot transport patient with his wheelchair. Lift transportation not available to answer calls. CM got approval from supervisor to arrange [employer] cab with cost of $110. [employer] called and transportation scheduled. No further TOC needs noted.   Barriers to Discharge: Barriers Resolved   Patient Goals and CMS Choice Patient states their goals for this hospitalization and ongoing recovery are:: To go home CMS Medicare.gov Compare Post Acute Care list provided to:: Patient Choice offered to / list presented to : Patient  Discharge Placement                       Discharge Plan and Services                DME Arranged:  Wheelchair manual DME Agency: Other - Comment                  Social Determinants of Health (SDOH) Interventions     Readmission Risk Interventions Readmission Risk Prevention Plan 05/28/2019  Transportation Screening Complete  PCP or Specialist Appt within 5-7 Days Complete  Home Care Screening Complete  Medication Review (RN CM) Complete  Some recent data might be hidden

## 2021-01-05 NOTE — Telephone Encounter (Signed)
Pt called and states that he was told he would be able to d/c from the hospital today and that if he is not released he is going to check himself out. He said that there was to be an order in his chart to d/c him home. I looked and saw where [name] saw the pt yesterday and encouraged non weight bearing on the surgical foot and that he was to keep his prevena vac plugged in. I advised that [name] is not in the office today and pt states that he will call an attorney if he does not get released from the hospital today. I could here hospital staff in the background trying to talk to the pt. He said that he will call me back if he needs something else and ended the call.

## 2021-01-05 NOTE — Progress Notes (Signed)
Inpatient Rehab Admissions Coordinator:   Per OT recommendations pt was screened for CIR by Shann Medal, PT, DPT.  Note PT recommended SNF.  Does not likely have the medical necessity to support a rehab admission at this time, and appears with limited support at discharge.  Would recommend TOC f/u for SNF.   Shann Medal, PT, DPT Admissions Coordinator [phone] 01/05/21  12:21 PM

## 2021-01-05 NOTE — Progress Notes (Signed)
PROGRESS NOTE  [name] OIN:[id number] DOB: Sep 16, 1955   PCP: [name] [place]  Patient is from: Home.  Lives alone.  DOA: 12/28/2020 LOS: 8  Chief complaints:  No chief complaint on file.    Brief Narrative / Interim history: 65 year old M with PMH of right diabetic foot ulcer, NASH, left TMT amputation and HTN admitted to [facility] with right foot diabetic foot ulcer infection/cellulitis.  Started on broad-spectrum antibiotics and transferred to [facility] for TMT amputation by [name].  Underwent TMT amputation on 01/03/2021.  Therapy recommended SNF.  Subjective: Seen and examined earlier this morning.  No major events overnight of this morning.  Pain fairly controlled.  Has bowel movements as well.   Objective: Vitals:   01/04/21 1538 01/04/21 2002 01/05/21 0435 01/05/21 0735  BP: 120/83 132/76 120/71 134/78  Pulse: 93 83 68 79  Resp: 17 18 18 18   Temp: 98 F (36.7 C) 97.9 F (36.6 C) 97.9 F (36.6 C) 98.2 F (36.8 C)  TempSrc: Oral Oral Oral Oral  SpO2: 99% 99% 97% 100%  Weight:      Height:        Intake/Output Summary (Last 24 hours) at 01/05/2021 1327 Last data filed at 01/05/2021 0737 Gross per 24 hour  Intake 240 ml  Output 1450 ml  Net -1210 ml     Examination:  GENERAL: No apparent distress.  Nontoxic. HEENT: MMM.  Vision and hearing grossly intact.  NECK: Supple.  No apparent JVD.  RESP: 100% on RA.  No IWOB.  Fair aeration bilaterally. CVS:  RRR. Heart sounds normal.  ABD/GI/GU: BS+. Abd soft, NTND.  MSK/EXT:  Moves extremities.  New R TMT amputation and old L TMT amputation SKIN: no apparent skin lesion or wound NEURO: Awake and alert. Oriented appropriately.  No apparent focal neuro deficit. PSYCH: Calm. Normal affect.   Procedures:  01/03/2021-right TMT amputation  Microbiology summarized: [id number] and influenza PCR nonreactive. MRSA PCR screen negative. Blood cultures NGTD  Assessment & Plan: Right  diabetic foot ulcer infection/cellulitis -S/p right TMT amputation by [name] on 01/03/2021 -Per surgery, source control achieved surgically, and antibiotics discontinued 24 hours postop -Pain control per orthopedic surgery -Continue bowel regimen -Continue PT/OT-recommended SNF.    Uncontrolled DM-2 with hyperglycemia and neuropathy: A1c 8.2%.  Injects Levemir 110 to 115 units twice daily at home. Recent Labs  Lab 01/04/21 2008 01/05/21 0004 01/05/21 0445 01/05/21 0732 01/05/21 1101  GLUCAP 229* 198* 161* 108* 154*  -Continue Levemir 35 units twice daily -Continue NovoLog 7 units 3 times daily with meals -Continue SSI-moderate -Continue statin and gabapentin  Anemia of acute illness: H&H relatively stable Recent Labs    04/15/20 0130 04/16/20 0314 04/17/20 0429 12/28/20 1727 12/29/20 0620 12/30/20 0443 01/01/21 0501 01/03/21 0628 01/04/21 0449 01/05/21 0444  HGB 8.3* 8.3* 8.2* 13.3 12.1* 10.9* 12.8* 13.2 12.0* 10.6*  -Monitor intermittently  Essential hypertension: Normotensive. -Continue home metoprolol at reduced dose. -Continue holding home hydralazine -Hydralazine 25 mg TID PRN.   Non-anion gap metabolic acidosis-likely from IV fluid.  Resolved.  Lactic acidosis: Resolved.  Hyponatremia: Resolved.   Hypokalemia: K3.5. -P.o. KCl 40x1  Body mass index is 28.31 kg/m.         DVT prophylaxis:  SCD's Start: 01/03/21 1037 heparin injection 5,000 Units Start: 12/29/20 0600  Code Status: Full code Family Communication: Patient and/or RN. Available if any question.  Level of care: Med-Surg Status is: Inpatient  Remains inpatient appropriate because: Safe disposition/SNF.  Patient  lives alone and is minimally weightbearing on right.   Final disposition: Likely SNF  Consultants:  Orthopedic surgery   Sch Meds:  Scheduled Meds:  vitamin C  1,000 mg Oral Daily   colchicine  0.6 mg Oral BID   docusate sodium  100 mg Oral Daily   gabapentin  300  mg Oral TID   heparin  5,000 Units Subcutaneous Q8H   insulin aspart  0-15 Units Subcutaneous Q4H   insulin aspart  7 Units Subcutaneous TID WC   insulin detemir  35 Units Subcutaneous BID   metoprolol tartrate  12.5 mg Oral BID   nutrition supplement (JUVEN)  1 packet Oral BID BM   pantoprazole  40 mg Oral Daily   zinc sulfate  220 mg Oral Daily   Continuous Infusions:   PRN Meds:.acetaminophen, alum & mag hydroxide-simeth, amitriptyline, guaiFENesin-dextromethorphan, hydrALAZINE, HYDROmorphone (DILAUDID) injection, HYDROmorphone (DILAUDID) injection, methocarbamol, ondansetron, ondansetron **OR** [DISCONTINUED] ondansetron (ZOFRAN) IV, oxyCODONE, oxyCODONE, oxyCODONE, phenol, polyethylene glycol, senna-docusate  Antimicrobials: Anti-infectives (From admission, onward)    Start     Dose/Rate Route Frequency Ordered Stop   01/03/21 0600  ceFAZolin (ANCEF) IVPB 2g/100 mL premix        2 g 200 mL/hr over 30 Minutes Intravenous On call to O.R. 01/02/21 1905 01/03/21 0836   01/01/21 0445  vancomycin (VANCOREADY) IVPB 1500 mg/300 mL       See Hyperspace for full Linked Orders Report.   1,500 mg 150 mL/hr over 120 Minutes Intravenous Every 12 hours 12/31/20 1531 01/04/21 2006   12/31/20 2200  vancomycin (VANCOREADY) IVPB 1500 mg/300 mL  Status:  Discontinued        1,500 mg 150 mL/hr over 120 Minutes Intravenous Every 12 hours 12/31/20 1015 12/31/20 1524   12/31/20 1630  vancomycin (VANCOREADY) IVPB 1500 mg/300 mL  Status:  Discontinued        1,500 mg 150 mL/hr over 120 Minutes Intravenous Every 12 hours 12/31/20 1524 12/31/20 1531   12/31/20 1615  vancomycin (VANCOREADY) IVPB 1250 mg/250 mL       See Hyperspace for full Linked Orders Report.   1,250 mg 166.7 mL/hr over 90 Minutes Intravenous  Once 12/31/20 1531 12/31/20 1630   12/29/20 2200  vancomycin (VANCOREADY) IVPB 1250 mg/250 mL  Status:  Discontinued        1,250 mg 166.7 mL/hr over 90 Minutes Intravenous Every 12 hours  12/29/20 1104 12/31/20 1015   12/29/20 1600  cefTRIAXone (ROCEPHIN) 2 g in sodium chloride 0.9 % 100 mL IVPB        2 g 200 mL/hr over 30 Minutes Intravenous Every 24 hours 12/28/20 2357 01/04/21 1616   12/29/20 1000  vancomycin (VANCOREADY) IVPB 1500 mg/300 mL  Status:  Discontinued        1,500 mg 150 mL/hr over 120 Minutes Intravenous Every 12 hours 12/28/20 2058 12/29/20 1104   12/28/20 2100  metroNIDAZOLE (FLAGYL) IVPB 500 mg  Status:  Discontinued        500 mg 100 mL/hr over 60 Minutes Intravenous Every 12 hours 12/28/20 2044 01/05/21 0709   12/28/20 2100  vancomycin (VANCOCIN) IVPB 1000 mg/200 mL premix        1,000 mg 200 mL/hr over 60 Minutes Intravenous  Once 12/28/20 2058 12/28/20 2311   12/28/20 1915  vancomycin (VANCOCIN) IVPB 1000 mg/200 mL premix        1,000 mg 200 mL/hr over 60 Minutes Intravenous  Once 12/28/20 1909 12/28/20 2210   12/28/20 1915  cefTRIAXone (ROCEPHIN)  2 g in sodium chloride 0.9 % 100 mL IVPB        2 g 200 mL/hr over 30 Minutes Intravenous  Once 12/28/20 1909 12/28/20 2104        I have personally reviewed the following labs and images: CBC: Recent Labs  Lab 12/30/20 0443 01/01/21 0501 01/03/21 0628 01/04/21 0449 01/05/21 0444  WBC 6.2 7.3 8.7 9.9 7.1  HGB 10.9* 12.8* 13.2 12.0* 10.6*  HCT 31.2* 34.6* 37.4* 32.6* 30.1*  MCV 92.9 90.6 90.6 88.8 91.5  PLT 132* 181 215 249 223   BMP &GFR Recent Labs  Lab 12/30/20 0443 01/01/21 0501 01/03/21 0628 01/04/21 0449 01/05/21 0444  NA 135 132* 131* 129* 136  K 3.4* 3.9 3.7 4.0 3.5  CL 101 99 98 99 106  CO2 25 26 25  21* 23  GLUCOSE 171* 205* 189* 185* 165*  BUN 14 9 20  33* 30*  CREATININE 0.93 1.06 0.97 1.03 0.97  CALCIUM 8.4* 8.7* 8.6* 8.5* 8.3*  MG  --   --   --  1.8 1.8  PHOS  --   --   --  2.8 2.8   Estimated Creatinine Clearance: 98.5 mL/min (by C-G formula based on SCr of 0.97 mg/dL). Liver & Pancreas: Recent Labs  Lab 01/04/21 0449 01/05/21 0444  ALBUMIN 2.8* 2.6*   No  results for input(s): LIPASE, AMYLASE in the last 168 hours. No results for input(s): AMMONIA in the last 168 hours. Diabetic: No results for input(s): HGBA1C in the last 72 hours. Recent Labs  Lab 01/04/21 2008 01/05/21 0004 01/05/21 0445 01/05/21 0732 01/05/21 1101  GLUCAP 229* 198* 161* 108* 154*   Cardiac Enzymes: No results for input(s): CKTOTAL, CKMB, CKMBINDEX, TROPONINI in the last 168 hours. No results for input(s): PROBNP in the last 8760 hours. Coagulation Profile: No results for input(s): INR, PROTIME in the last 168 hours. Thyroid Function Tests: No results for input(s): TSH, T4TOTAL, FREET4, T3FREE, THYROIDAB in the last 72 hours. Lipid Profile: No results for input(s): CHOL, HDL, LDLCALC, TRIG, CHOLHDL, LDLDIRECT in the last 72 hours. Anemia Panel: No results for input(s): VITAMINB12, FOLATE, FERRITIN, TIBC, IRON, RETICCTPCT in the last 72 hours. Urine analysis:    Component Value Date/Time   COLORURINE YELLOW 04/13/2020 0035   APPEARANCEUR CLOUDY (A) 04/13/2020 0035   LABSPEC 1.016 04/13/2020 0035   PHURINE 5.0 04/13/2020 0035   GLUCOSEU 150 (A) 04/13/2020 0035   HGBUR MODERATE (A) 04/13/2020 0035   BILIRUBINUR NEGATIVE 04/13/2020 0035   KETONESUR 80 (A) 04/13/2020 0035   PROTEINUR NEGATIVE 04/13/2020 0035   NITRITE NEGATIVE 04/13/2020 0035   LEUKOCYTESUR LARGE (A) 04/13/2020 0035   Sepsis Labs: Invalid input(s): PROCALCITONIN, [place]  Microbiology: Recent Results (from the past 240 hour(s))  Blood culture (routine x 2)     Status: None   Collection Time: 12/28/20  5:27 PM   Specimen: BLOOD  Result Value Ref Range Status   Specimen Description BLOOD BLOOD RIGHT ARM  Final   Special Requests   Final    BOTTLES DRAWN AEROBIC AND ANAEROBIC Blood Culture adequate volume   Culture   Final    NO GROWTH 6 DAYS Performed at [facility], [address] [place], [place] [address]    Report Status 01/03/2021 FINAL  Final  Blood culture (routine x 2)      Status: None   Collection Time: 12/28/20  5:27 PM   Specimen: BLOOD  Result Value Ref Range Status   Specimen Description BLOOD BLOOD  LEFT ARM  Final   Special Requests   Final    BOTTLES DRAWN AEROBIC AND ANAEROBIC Blood Culture adequate volume   Culture   Final    NO GROWTH 6 DAYS Performed at [facility], [address], [place], [place] [address]    Report Status 01/03/2021 FINAL  Final  Resp Panel by RT-PCR (Flu A&B, Covid) Nasopharyngeal Swab     Status: None   Collection Time: 12/28/20  7:18 PM   Specimen: Nasopharyngeal Swab; Nasopharyngeal(NP) swabs in vial transport medium  Result Value Ref Range Status   SARS Coronavirus 2 by RT PCR NEGATIVE NEGATIVE Final    Comment: (NOTE) SARS-CoV-2 target nucleic acids are NOT DETECTED.  The SARS-CoV-2 RNA is generally detectable in upper respiratory specimens during the acute phase of infection. The lowest concentration of SARS-CoV-2 viral copies this assay can detect is 138 copies/mL. A negative result does not preclude SARS-Cov-2 infection and should not be used as the sole basis for treatment or other patient management decisions. A negative result may occur with  improper specimen collection/handling, submission of specimen other than nasopharyngeal swab, presence of viral mutation(s) within the areas targeted by this assay, and inadequate number of viral copies(<138 copies/mL). A negative result must be combined with clinical observations, patient history, and epidemiological information. The expected result is Negative.  Fact Sheet for Patients:  EntrepreneurPulse.com.au  Fact Sheet for Healthcare Providers:  IncredibleEmployment.be  This test is no t yet approved or cleared by the [place] FDA and  has been authorized for detection and/or diagnosis of SARS-CoV-2 by FDA under an Emergency Use Authorization (EUA). This EUA will remain  in effect (meaning this test can be used)  for the duration of the COVID-19 declaration under Section 564(b)(1) of the Act, 21 U.S.C.section 360bbb-3(b)(1), unless the authorization is terminated  or revoked sooner.       Influenza A by PCR NEGATIVE NEGATIVE Final   Influenza B by PCR NEGATIVE NEGATIVE Final    Comment: (NOTE) The Xpert Xpress SARS-CoV-2/FLU/RSV plus assay is intended as an aid in the diagnosis of influenza from Nasopharyngeal swab specimens and should not be used as a sole basis for treatment. Nasal washings and aspirates are unacceptable for Xpert Xpress SARS-CoV-2/FLU/RSV testing.  Fact Sheet for Patients: EntrepreneurPulse.com.au  Fact Sheet for Healthcare Providers: IncredibleEmployment.be  This test is not yet approved or cleared by the [place] FDA and has been authorized for detection and/or diagnosis of SARS-CoV-2 by FDA under an Emergency Use Authorization (EUA). This EUA will remain in effect (meaning this test can be used) for the duration of the COVID-19 declaration under Section 564(b)(1) of the Act, 21 U.S.C. section 360bbb-3(b)(1), unless the authorization is terminated or revoked.  Performed at [facility], [address] [place], [place] [address]   MRSA Next Gen by PCR, Nasal     Status: None   Collection Time: 01/02/21 12:05 PM   Specimen: Nasal Mucosa; Nasal Swab  Result Value Ref Range Status   MRSA by PCR Next Gen NOT DETECTED NOT DETECTED Final    Comment: (NOTE) The GeneXpert MRSA Assay (FDA approved for NASAL specimens only), is one component of a comprehensive MRSA colonization surveillance program. It is not intended to diagnose MRSA infection nor to guide or monitor treatment for MRSA infections. Test performance is not FDA approved in patients less than 21 years old. Performed at [facility], [place] [address], [place], [place] [address]     Radiology Studies: No  results found.    Livy Ross T. [place]  If 7PM-7AM, please contact night-coverage [email or web address] 01/05/2021, 1:27 PM

## 2021-01-11 ENCOUNTER — Telehealth: Payer: Self-pay | Admitting: Orthopedic Surgery

## 2021-01-11 NOTE — Telephone Encounter (Signed)
Pt called stating his wound vac or CPM machine ( not sure which) stopped sucking the blood up last night. Pt also stated the wrap around his foot is really tight and he would like a CB to be advised what he should do about both of these problems.   [phone]

## 2021-01-11 NOTE — Telephone Encounter (Signed)
I called pt and was not able to confirm that the vac is working. Very difficult to understand pt he states that the device is plugged into the wall but not making noise and does not see anything in the canister. I advised that this does not mean it is not working and asked if he could tell me if the green lights were on around the power button. He said he did not know. I advised the pt to keep the vac plugged into the wall and to elevate his foot and not have it down on the floor. Swelling can cause the dressing to feel tight. Offered appt today for eval and pt declined due to transportation. He said that he can come tomorrow so an appt was made with NP told pt to call if something is to change.

## 2021-01-12 ENCOUNTER — Ambulatory Visit: Payer: Medicaid Other | Admitting: Family

## 2021-01-12 ENCOUNTER — Ambulatory Visit: Payer: Medicaid Other

## 2021-01-12 DIAGNOSIS — Z89431 Acquired absence of right foot: Secondary | ICD-10-CM

## 2021-01-12 NOTE — Progress Notes (Signed)
Patient is s/p a right transmetatarsal amputation on 01/03/21. He is in today with complaints of his wound vac not working. The vac has been disconnected he is holding this in a bad with the sponge and drape still attached to his foot. The pt is not clear on when he took this apart. The canister is full. The remaining dressing has been removed and a dry dressing applied. The pt is upset because he states he was supposed to receive home health nursing for dressing changes. I advised the pt that after review of his chart I can see that the case manager at the hospital worked hard to get a home health referral accepted by [facility], [facility] and [facility]. They do not have the staff and are not able to see the pt. Applied 4x4 and ace to the right foot and gave the pt extra supplies. Advised that he will have to keep the area clean and covered and change the dressing daily. Reminded the pt that he is not to be weight bearing to transfer through the heel only. Encouraged the pt to elevate his foot higher than his hear to assist with the swelling and he will come in for an office visit with [name] on Thursday of next week. Pt voiced understanding and will call with any questions.   Jonathan Kirkendoll, [place], [employer]

## 2021-01-18 ENCOUNTER — Ambulatory Visit: Payer: Medicaid Other | Admitting: Orthopedic Surgery

## 2021-02-02 ENCOUNTER — Telehealth: Payer: Self-pay

## 2021-02-02 NOTE — Telephone Encounter (Signed)
Patient called triage phone at 459pm stating he was having increased swelling, redness and issues with his foot. States he just had surgery back in December by Dr Sharol Given.  I worked him in on Tuesday with Junie Panning but he wanted call back first thing Monday to discuss this. I advised patient if he developed increased drainage, fevers, chills that should proceed to ER.

## 2021-02-05 ENCOUNTER — Telehealth: Payer: Self-pay

## 2021-02-05 NOTE — Telephone Encounter (Signed)
Pt called and sw front desk very irritated about not being able to set up transportation. I advised the pt that I have set transport up for him to bring him to his appt tomorrow. They will pick the pt up at 8am and we will arrange to bring him home. Pt said that he will see what he can do. I advised that if he does not come to his appt and his foot is in the condition that he is saying it is then he must go to the ER.

## 2021-02-05 NOTE — Telephone Encounter (Signed)
I called the pt and he did not go to the ER over the weekend but states that he is not feeling well. I advised the pt that he has an appt tomorrow but if he is feeling poorly he should go to the ER. Pt states that he still has stitched in his foot for his transmet surgery the end of December and states that he was not told he needed a follow up appt in the office. I advised the pt that he was seen in the office 01/12/21 to have his wound vac removed and ana ppt for the following week with [name] was scheduled for 01/18/21 but he did not show for the appt. The pt states that he will arrange for transportation to bring him to the office tomorrow for eval.

## 2021-02-06 ENCOUNTER — Telehealth: Payer: Self-pay | Admitting: *Deleted

## 2021-02-06 ENCOUNTER — Ambulatory Visit (INDEPENDENT_AMBULATORY_CARE_PROVIDER_SITE_OTHER): Payer: Medicaid Other | Admitting: Family

## 2021-02-06 DIAGNOSIS — Z89431 Acquired absence of right foot: Secondary | ICD-10-CM

## 2021-02-06 NOTE — Telephone Encounter (Signed)
Pending authorization with pt insurance with [employer]. Sent secure message to [place] informing needs updated OV notes to upload to [employer].

## 2021-02-06 NOTE — Telephone Encounter (Signed)
Status Current Status: Pending Validity Period: [Not Applicable] Tracking Number: [id number] Patient Name: [name] Subscriber ID: [id number] Date of Birth: 09-09-1955 Gender: Male

## 2021-02-07 ENCOUNTER — Encounter: Payer: Self-pay | Admitting: Family

## 2021-02-07 NOTE — Progress Notes (Signed)
Post-Op Visit Note   Patient: [name]           Date of Birth: 01/26/1955           MRN: [id number] Visit Date: 02/06/2021 PCP: [name] [place]  Chief Complaint:  Chief Complaint  Patient presents with   Right Foot - Routine Post Op    01/03/21 right transmet amputation     HPI:  HPI The patient is a 66 year old gentleman seen status post right transmetatarsal amputation.  This is his first postop visit surgery was December 21.  Ortho Exam On examination of the right foot his incision is healing well there is scant dried blood there is no gaping no active drainage no erythema no warmth  Visit Diagnoses:  1. History of transmetatarsal amputation of right foot ([place])     Plan: Sutures harvested today.  Continue daily Dial soap cleansing.  Dry dressings.  May advance weightbearing as tolerated  Follow-Up Instructions: Return in about 3 weeks (around 02/27/2021).   Imaging: No results found.  Orders:  No orders of the defined types were placed in this encounter.  No orders of the defined types were placed in this encounter.    PMFS History: Patient Active Problem List   Diagnosis Date Noted   Cellulitis of right leg    Cellulitis of right foot 12/28/2020   Closed intertrochanteric fracture of right hip, initial encounter [facility])    Fx intertrochanteric hip ([place]) 04/13/2020   Acute lower UTI 04/13/2020   Anxiety 04/13/2020   Agitation 04/13/2020   Wound dehiscence 05/26/2019   Dehiscence of amputation stump (HCC)    Wound infection    Acute osteomyelitis of left foot ([place]) 04/11/2019   Lactic acidosis 04/11/2019   Sepsis due to Enterobacter ([place]) 04/11/2019   Diabetic foot ulcer ([place]) 03/25/2019   Idiopathic chronic gout of left foot without tophus    Myelopathy (HCC) 07/16/2013   Ingrown nail 07/05/2013   Paronychia 07/05/2013   Diverticulosis 04/22/2013   Acute osteomyelitis of humerus ([place]) 02/07/2013   Osteomyelitis of left  foot ([place]) 02/01/2013   Joint infection ([place]) 01/22/2013   Pyogenic arthritis of shoulder region ([place]) 01/15/2013   Nephrolithiasis 01/23/2012   Lumbar radiculopathy 02/28/2011   Acute gout of right knee 04/30/2010   Chronic pain 02/14/2010   NASH (nonalcoholic steatohepatitis) 11/08/2008   Obesity 10/27/2008   Hypertriglyceridemia 10/27/2007   Uncontrolled type 2 diabetes mellitus with hyperglycemia, with long-term current use of insulin ([place]) 03/12/2006   Essential hypertension, benign 03/12/2006   Past Medical History:  Diagnosis Date   Arthritis    Back pain    Diabetes mellitus without complication ([place])    Diabetic foot ulcer ([place]) 03/2019   History of kidney stones    Hypertension     Family History  Problem Relation Age of Onset   Heart failure Mother    Cancer Father     Past Surgical History:  Procedure Laterality Date   AMPUTATION Right 01/03/2021   Procedure: RIGHT TRANSMETATARSAL AMPUTATION;  Surgeon: [name], MD;  Location: [place];  Service: Orthopedics;  Laterality: Right;   BACK SURGERY     CARPAL TUNNEL RELEASE     CHOLECYSTECTOMY     FOOT SURGERY     INTRAMEDULLARY (IM) NAIL INTERTROCHANTERIC Right 04/14/2020   Procedure: INTRAMEDULLARY (IM) NAIL INTERTROCHANTRIC;  Surgeon: [name], MD;  Location: [place];  Service: Orthopedics;  Laterality: Right;   INTRAMEDULLARY (IM) NAIL INTERTROCHANTRIC (Right Leg Upper)  04/14/2020   JOINT REPLACEMENT     REVISION OF TRANSMETATARSAL AMPUTATION Right 05/26/2019   SHOULDER ARTHROSCOPY     STUMP REVISION Left 05/26/2019   Procedure: REVISION LEFT TRANSMETATARSAL AMPUTATION;  Surgeon: [name], MD;  Location: [facility];  Service: Orthopedics;  Laterality: Left;   TOTAL KNEE ARTHROPLASTY     TRANSMETATARSAL AMPUTATION Left 03/27/2019   Procedure: TRANSMETATARSAL AMPUTATION left foot;  Surgeon: [name], MD;  Location: [place];  Service: Orthopedics;  Laterality: Left;   Social History   Occupational  History   Not on file  Tobacco Use   Smoking status: Never   Smokeless tobacco: Never  Vaping Use   Vaping Use: Never used  Substance and Sexual Activity   Alcohol use: No   Drug use: No   Sexual activity: Not on file

## 2021-02-13 NOTE — Telephone Encounter (Signed)
Received fax from [place] needing additional clinicals. All pertinent inforamtion was uploaded onto [employer].

## 2021-02-15 DIAGNOSIS — G894 Chronic pain syndrome: Secondary | ICD-10-CM | POA: Diagnosis not present

## 2021-02-16 NOTE — Telephone Encounter (Signed)
Received denial on [employer] stating :  On 02/15/2021, we asked your provider for the following important facts or documents: doctor's notes that give the findings of the test (Lumbar Spine MRI (Magnetic Resonance Imaging)) that was already approved. That test was approved on 11/13/2020. If this test was done, we need to know why another test is needed.   Without this additional information, your request did not meet criteria for approval found in NIA Clinical Guideline 044 for Lumbar Spine MRI.  I do not see any where in pt's chart where another was done, I called pt left message for him to contact me to see if he did have one done before I proceed with MRI.

## 2021-02-16 NOTE — Telephone Encounter (Signed)
Submitted letter stating pt never had the MRI performed to [employer]. Pending approval from [employer].

## 2021-02-19 NOTE — Telephone Encounter (Signed)
Authorization from [place] # [id number], valid 02/19/21-03/21/21

## 2021-02-24 ENCOUNTER — Other Ambulatory Visit: Payer: Self-pay

## 2021-02-24 ENCOUNTER — Encounter (HOSPITAL_COMMUNITY): Payer: Self-pay

## 2021-02-24 ENCOUNTER — Emergency Department (HOSPITAL_COMMUNITY): Payer: Medicaid Other

## 2021-02-24 ENCOUNTER — Emergency Department (HOSPITAL_COMMUNITY)
Admission: EM | Admit: 2021-02-24 | Discharge: 2021-02-25 | Disposition: A | Discharge status: Home / Self Care | Payer: Medicaid Other | Attending: Emergency Medicine | Admitting: Emergency Medicine

## 2021-02-24 DIAGNOSIS — Z7984 Long term (current) use of oral hypoglycemic drugs: Secondary | ICD-10-CM | POA: Diagnosis not present

## 2021-02-24 DIAGNOSIS — R112 Nausea with vomiting, unspecified: Secondary | ICD-10-CM | POA: Insufficient documentation

## 2021-02-24 DIAGNOSIS — M25551 Pain in right hip: Secondary | ICD-10-CM | POA: Diagnosis not present

## 2021-02-24 DIAGNOSIS — L03119 Cellulitis of unspecified part of limb: Secondary | ICD-10-CM

## 2021-02-24 DIAGNOSIS — L03116 Cellulitis of left lower limb: Secondary | ICD-10-CM | POA: Diagnosis not present

## 2021-02-24 DIAGNOSIS — E119 Type 2 diabetes mellitus without complications: Secondary | ICD-10-CM | POA: Insufficient documentation

## 2021-02-24 DIAGNOSIS — Z794 Long term (current) use of insulin: Secondary | ICD-10-CM | POA: Diagnosis not present

## 2021-02-24 DIAGNOSIS — Z79899 Other long term (current) drug therapy: Secondary | ICD-10-CM | POA: Diagnosis not present

## 2021-02-24 DIAGNOSIS — M79671 Pain in right foot: Secondary | ICD-10-CM | POA: Diagnosis not present

## 2021-02-24 DIAGNOSIS — I10 Essential (primary) hypertension: Secondary | ICD-10-CM | POA: Diagnosis not present

## 2021-02-24 LAB — CBC WITH DIFFERENTIAL/PLATELET
Abs Immature Granulocytes: 0.03 10*3/uL (ref 0.00–0.07)
Basophils Absolute: 0 10*3/uL (ref 0.0–0.1)
Basophils Relative: 0 %
Eosinophils Absolute: 0.1 10*3/uL (ref 0.0–0.5)
Eosinophils Relative: 1 %
HCT: 35.9 % — ABNORMAL LOW (ref 39.0–52.0)
Hemoglobin: 12.4 g/dL — ABNORMAL LOW (ref 13.0–17.0)
Immature Granulocytes: 1 %
Lymphocytes Relative: 25 %
Lymphs Abs: 1.6 10*3/uL (ref 0.7–4.0)
MCH: 31.6 pg (ref 26.0–34.0)
MCHC: 34.5 g/dL (ref 30.0–36.0)
MCV: 91.3 fL (ref 80.0–100.0)
Monocytes Absolute: 0.4 10*3/uL (ref 0.1–1.0)
Monocytes Relative: 6 %
Neutro Abs: 4.1 10*3/uL (ref 1.7–7.7)
Neutrophils Relative %: 67 %
Platelets: 152 10*3/uL (ref 150–400)
RBC: 3.93 MIL/uL — ABNORMAL LOW (ref 4.22–5.81)
RDW: 13.2 % (ref 11.5–15.5)
WBC: 6.2 10*3/uL (ref 4.0–10.5)
nRBC: 0 % (ref 0.0–0.2)

## 2021-02-24 LAB — BASIC METABOLIC PANEL
Anion gap: 9 (ref 5–15)
BUN: 15 mg/dL (ref 8–23)
CO2: 26 mmol/L (ref 22–32)
Calcium: 9 mg/dL (ref 8.9–10.3)
Chloride: 99 mmol/L (ref 98–111)
Creatinine, Ser: 0.87 mg/dL (ref 0.61–1.24)
GFR, Estimated: >60 mL/min (ref >60)
Glucose, Bld: 220 mg/dL — ABNORMAL HIGH (ref 70–99)
Potassium: 4.1 mmol/L (ref 3.5–5.1)
Sodium: 134 mmol/L — ABNORMAL LOW (ref 135–145)

## 2021-02-24 MED ORDER — FENTANYL CITRATE PF 50 MCG/ML IJ SOSY
50.0000 ug | PREFILLED_SYRINGE | Freq: Once | INTRAMUSCULAR | Status: AC
  Administered 2021-02-24: 50 ug via INTRAVENOUS
  Filled 2021-02-24: qty 1

## 2021-02-24 NOTE — ED Provider Notes (Signed)
[facility] EMERGENCY DEPARTMENT Provider Note   CSN: [id number] Arrival date & time: 02/24/21  2048     History  Chief Complaint  Patient presents with   Hip Pain    [name] is a 66 y.o. male.   Hip Pain      [name] is a 66 y.o. 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  States he is concerned that his foot is not healing well and that he has an infection.  He reports a small open area that drained some blood and pus at times.  He states the wound appears red.  He also endorses some intermittent nausea and vomiting and chills.  No fever.  He denies numbness or swelling of his leg or foot.  No recent injury.  He takes 15 mg oxycodone 4 times a day without significant relief.    Home Medications Prior to Admission medications   Medication Sig Start Date End Date Taking? Authorizing Provider  acetaminophen (TYLENOL) 500 MG tablet Take 500 mg by mouth every 6 (six) hours as needed for headache (pain).    [provider]  allopurinol (ZYLOPRIM) 100 MG tablet TAKE 1 TABLET BY MOUTH TWICE A DAY Patient taking differently: Take 100 mg by mouth 2 (two) times daily as needed (gout attacks). 12/28/20   [name], MD  amitriptyline (ELAVIL) 25 MG tablet Take 25 mg by mouth at bedtime as needed for sleep. 03/11/19   [provider]  amLODipine (NORVASC) 10 MG tablet Take 10 mg by mouth at bedtime. 02/08/16   [provider]  cyclobenzaprine (FLEXERIL) 10 MG tablet Take 10 mg by mouth at bedtime. 03/30/20   [provider]  ferrous sulfate 325 (65 FE) MG tablet Take 1 tablet (325 mg total) by mouth daily. Patient not taking: Reported on 12/30/2020 04/18/20  04/18/21  Shelly Coss, MD  folic acid (FOLVITE) 1 MG tablet Take 1 tablet (1 mg total) by mouth daily. Patient not taking: Reported on 12/30/2020 04/18/20   Florencia Reasons, MD  gabapentin (NEURONTIN) 300 MG capsule Take 300 mg by mouth 3 (three) times daily. 03/16/19   [provider]  glipiZIDE (GLUCOTROL XL) 2.5 MG 24 hr tablet Take 2.5 mg by mouth every morning. 03/30/20   [provider]  insulin detemir (LEVEMIR) 100 UNIT/ML injection Inject 0.45 mLs (45 Units total) into the skin 2 (two) times daily. 01/05/21   Mercy Riding, MD  meloxicam (MOBIC) 15 MG tablet Take 15 mg by mouth at bedtime. 11/17/20   [provider]  metoprolol succinate (TOPROL-XL) 100 MG 24 hr tablet Take 100 mg by mouth every morning. 02/04/19   [provider]  Multiple Vitamin (MULTIVITAMIN WITH MINERALS) TABS tablet Take 1 tablet by mouth daily. Patient not taking: Reported on 12/30/2020 04/18/20   Florencia Reasons, MD  oxyCODONE (ROXICODONE) 15 MG immediate release tablet Take 15 mg by mouth 4 (four) times daily.    [provider]  polyethylene glycol (MIRALAX / GLYCOLAX) 17 g packet Take 17 g by mouth daily as needed for mild constipation. Patient not taking: Reported on 12/30/2020 04/17/20   Florencia Reasons, MD  senna-docusate (SENOKOT-S) 8.6-50 MG tablet Take 1 tablet by  mouth 2 (two) times daily as needed for moderate constipation. 01/05/21   Mercy Riding, MD  thiamine 100 MG tablet Take 1 tablet (100 mg total) by mouth daily. Patient not taking: Reported on 12/30/2020 04/18/20   Florencia Reasons, MD  vitamin B-12 1000 MCG tablet Take 1 tablet (1,000 mcg total) by mouth daily. Patient not taking: Reported on 12/30/2020 04/18/20   Florencia Reasons, MD      Allergies    Other, Pravastatin, Prednisone, and Toradol [ketorolac tromethamine]    Review of Systems   Review of Systems  Constitutional:  Positive for chills. Negative for fever.  Musculoskeletal:  Positive for arthralgias (Pain of the right hip, pain  redness and open wound of the distal right foot.).  Neurological:  Negative for weakness and numbness.  All other systems reviewed and are negative.  Physical Exam Updated Vital Signs BP (!) 154/93 (BP Location: Right Arm)    Pulse (!) 109    Temp 97.8 F (36.6 C)    Resp 14    Ht 6' 2"  (1.88 m)    Wt 131.5 kg    SpO2 100%    BMI 37.23 kg/m  Physical Exam Vitals and nursing note reviewed.  Constitutional:      Appearance: Normal appearance. He is not ill-appearing or toxic-appearing.  HENT:     Head: Atraumatic.  Cardiovascular:     Rate and Rhythm: Normal rate and regular rhythm.     Pulses: Normal pulses.  Pulmonary:     Effort: Pulmonary effort is normal.     Breath sounds: Normal breath sounds.  Abdominal:     Palpations: Abdomen is soft.     Tenderness: There is no abdominal tenderness.  Musculoskeletal:        General: Tenderness present. No swelling.     Comments: Prior amputation of all the toes of the right foot.  There is a small area of wound dehiscence to the medial aspect of the distal stump.  Some surrounding erythema without lymphangitis.  No purulent drainage noted.  No excessive warmth.  No erythema of the proximal foot.  Incision of the lateral right hip appears well-healed.  No surrounding erythema or edema.  Patient able to perform straight leg raise on the right, hip flexors and extensors intact  Skin:    General: Skin is warm.     Capillary Refill: Capillary refill takes less than 2 seconds.  Neurological:     General: No focal deficit present.     Mental Status: He is alert.     Sensory: No sensory deficit.     Motor: No weakness.    ED Results / Procedures / Treatments   Labs (all labs ordered are listed, but only abnormal results are displayed) Labs Reviewed  BASIC METABOLIC PANEL - Abnormal; Notable for the following components:      Result Value   Sodium 134 (*)    Glucose, Bld 220 (*)    All other components within normal limits  CBC WITH  DIFFERENTIAL/PLATELET - Abnormal; Notable for the following components:   RBC 3.93 (*)    Hemoglobin 12.4 (*)    HCT 35.9 (*)    All other components within normal limits    EKG None  Radiology DG Foot Complete Right  Result Date: 02/24/2021 CLINICAL DATA:  Status post amputation, pain/redness EXAM: RIGHT FOOT COMPLETE - 3+ VIEW COMPARISON:  None. FINDINGS: Status post mid tarsal forefoot amputation. No cortical irregularity/destruction to suggest osteomyelitis. Visualized soft tissues  are grossly unremarkable. IMPRESSION: No radiographic findings to suggest osteomyelitis. Status post mid tarsal forefoot amputation. Electronically Signed   By: Julian Hy M.D.   On: 02/24/2021 23:20    Procedures Procedures    Medications Ordered in ED Medications  fentaNYL (SUBLIMAZE) injection 50 mcg (has no administration in time range)    ED Course/ Medical Decision Making/ A&P                           Medical Decision Making Patient here for evaluation of right foot and right hip pain.  History of IM nail of the right hip 04/22 and amputation of the toes of the right foot 12/22.  Reports persistent pain of his hip since his procedure.  He is concerned of redness in nonhealing wound of his foot.  Amount and/or Complexity of Data Reviewed External Data Reviewed: notes. Labs: ordered.    Details: Labs ordered and interpreted by me showed no evidence of leukocytosis.  Electrolytes show a blood sugar of 220, kidney functions unremarkable. Radiology: ordered.    Details: X-ray of the right foot is without evidence of osteomyelitis.  Images reviewed by me and I agree with radiology interpretation  Risk Prescription drug management.   Patient here with likely acute on chronic right hip pain.  There is some redness and wound dehiscence of the distal right foot.  No radiographic evidence of osteomyelitis.  Redness is felt to be related to cellulitis.  I feel that patient would benefit from  antibiotics.  He is otherwise well-appearing and nontoxic. He agrees to plan and close outpatient follow-up with his orthopedist.  Return precautions were discussed.        Final Clinical Impression(s) / ED Diagnoses Final diagnoses:  Right hip pain  Cellulitis of foot    Rx / DC Orders ED Discharge Orders     None         Bufford Lope 02/25/21 0008    Milton Ferguson, MD 02/26/21 1211

## 2021-02-24 NOTE — ED Triage Notes (Signed)
Pt states he has multiple rods in his right leg, states he is in pain and has been since they were put in last year.

## 2021-02-25 MED ORDER — DOXYCYCLINE HYCLATE 100 MG PO TABS
100.0000 mg | ORAL_TABLET | Freq: Once | ORAL | Status: AC
  Administered 2021-02-25: 100 mg via ORAL
  Filled 2021-02-25: qty 1

## 2021-02-25 MED ORDER — DOXYCYCLINE HYCLATE 100 MG PO CAPS
100.0000 mg | ORAL_CAPSULE | Freq: Two times a day (BID) | ORAL | 0 refills | Status: AC
Start: 2021-02-25 — End: ?

## 2021-02-25 NOTE — Discharge Instructions (Signed)
Keep the right foot clean with mild soap and water.  Elevate your foot when possible.  Take the antibiotic as directed until it is finished.  Follow-up with your primary care provider or with your orthopedic provider in 1 week for recheck.  Return to emergency department for any new or worsening symptoms.

## 2021-03-02 DIAGNOSIS — M25551 Pain in right hip: Secondary | ICD-10-CM | POA: Diagnosis not present

## 2021-03-02 DIAGNOSIS — M25561 Pain in right knee: Secondary | ICD-10-CM | POA: Diagnosis not present

## 2021-03-02 DIAGNOSIS — M25552 Pain in left hip: Secondary | ICD-10-CM | POA: Diagnosis not present

## 2021-03-02 DIAGNOSIS — M79605 Pain in left leg: Secondary | ICD-10-CM | POA: Diagnosis not present

## 2021-03-23 DIAGNOSIS — Z79891 Long term (current) use of opiate analgesic: Secondary | ICD-10-CM | POA: Diagnosis not present

## 2021-03-23 DIAGNOSIS — G894 Chronic pain syndrome: Secondary | ICD-10-CM | POA: Diagnosis not present

## 2021-04-14 ENCOUNTER — Other Ambulatory Visit: Payer: Self-pay

## 2021-04-14 ENCOUNTER — Emergency Department (HOSPITAL_COMMUNITY)
Admission: EM | Admit: 2021-04-14 | Discharge: 2021-04-15 | Disposition: A | Discharge status: Home / Self Care | Payer: Medicaid Other | Attending: Emergency Medicine | Admitting: Emergency Medicine

## 2021-04-14 ENCOUNTER — Emergency Department (HOSPITAL_COMMUNITY): Payer: Medicaid Other

## 2021-04-14 ENCOUNTER — Encounter (HOSPITAL_COMMUNITY): Payer: Self-pay | Admitting: *Deleted

## 2021-04-14 DIAGNOSIS — M79604 Pain in right leg: Secondary | ICD-10-CM | POA: Insufficient documentation

## 2021-04-14 DIAGNOSIS — M542 Cervicalgia: Secondary | ICD-10-CM | POA: Diagnosis not present

## 2021-04-14 DIAGNOSIS — I10 Essential (primary) hypertension: Secondary | ICD-10-CM | POA: Insufficient documentation

## 2021-04-14 DIAGNOSIS — R600 Localized edema: Secondary | ICD-10-CM | POA: Diagnosis not present

## 2021-04-14 DIAGNOSIS — E119 Type 2 diabetes mellitus without complications: Secondary | ICD-10-CM | POA: Diagnosis not present

## 2021-04-14 DIAGNOSIS — M25551 Pain in right hip: Secondary | ICD-10-CM | POA: Insufficient documentation

## 2021-04-14 DIAGNOSIS — S0990XA Unspecified injury of head, initial encounter: Secondary | ICD-10-CM | POA: Insufficient documentation

## 2021-04-14 DIAGNOSIS — M2578 Osteophyte, vertebrae: Secondary | ICD-10-CM | POA: Diagnosis not present

## 2021-04-14 DIAGNOSIS — Z87442 Personal history of urinary calculi: Secondary | ICD-10-CM | POA: Insufficient documentation

## 2021-04-14 DIAGNOSIS — R000 Tachycardia, unspecified: Secondary | ICD-10-CM | POA: Diagnosis not present

## 2021-04-14 DIAGNOSIS — M19011 Primary osteoarthritis, right shoulder: Secondary | ICD-10-CM | POA: Diagnosis not present

## 2021-04-14 DIAGNOSIS — M25511 Pain in right shoulder: Secondary | ICD-10-CM | POA: Diagnosis not present

## 2021-04-14 DIAGNOSIS — W19XXXA Unspecified fall, initial encounter: Secondary | ICD-10-CM

## 2021-04-14 NOTE — ED Triage Notes (Signed)
Pt was in the wheelchair and wheelchair went down the wooden ramp causing pt to fall out of the wheelchair hitting his head.  Denies any LOC.  Denies any blood thinners.  Pt took oxycodone an hour ago. ?

## 2021-04-14 NOTE — ED Provider Notes (Signed)
?[place] DEPT ?[facility] Emergency Department ?Provider Note ?MRN:  [id number]  ?Arrival date & time: 04/15/21    ? ?Chief Complaint   ?Fall ?  ?History of Present Illness   ?[name] is a 66 y.o. year-old male with a history of hypertension, diabetes presenting to the ED with chief complaint of fall. ? ?Patient was in his wheelchair and began rolling down a ramp and fell off of an embankment.  Fell backwards and hit his head.  No loss of consciousness.  Endorsing neck pain, headache, right shoulder pain, right hip pain, right leg pain.  Also endorsing new swelling to the bilateral lower extremities today. ? ?Review of Systems  ?A thorough review of systems was obtained and all systems are negative except as noted in the HPI and PMH.  ? ?Patient's Health History   ? ?Past Medical History:  ?Diagnosis Date  ? Arthritis   ? Back pain   ? Diabetes mellitus without complication ([place])   ? Diabetic foot ulcer ([place]) 03/2019  ? History of kidney stones   ? Hypertension   ?  ?Past Surgical History:  ?Procedure Laterality Date  ? AMPUTATION Right 01/03/2021  ? Procedure: RIGHT TRANSMETATARSAL AMPUTATION;  Surgeon: [name], MD;  Location: [place];  Service: Orthopedics;  Laterality: Right;  ? BACK SURGERY    ? CARPAL TUNNEL RELEASE    ? CHOLECYSTECTOMY    ? FOOT SURGERY    ? INTRAMEDULLARY (IM) NAIL INTERTROCHANTERIC Right 04/14/2020  ? Procedure: INTRAMEDULLARY (IM) NAIL INTERTROCHANTRIC;  Surgeon: [name], MD;  Location: [place];  Service: Orthopedics;  Laterality: Right;  ? INTRAMEDULLARY (IM) NAIL INTERTROCHANTRIC (Right Leg Upper)  04/14/2020  ? JOINT REPLACEMENT    ? REVISION OF TRANSMETATARSAL AMPUTATION Right 05/26/2019  ? SHOULDER ARTHROSCOPY    ? STUMP REVISION Left 05/26/2019  ? Procedure: REVISION LEFT TRANSMETATARSAL AMPUTATION;  Surgeon: [name], MD;  Location: [place];  Service: Orthopedics;  Laterality: Left;  ? TOTAL KNEE ARTHROPLASTY    ? TRANSMETATARSAL AMPUTATION Left  03/27/2019  ? Procedure: TRANSMETATARSAL AMPUTATION left foot;  Surgeon: [name], MD;  Location: [place];  Service: Orthopedics;  Laterality: Left;  ?  ?Family History  ?Problem Relation Age of Onset  ? Heart failure Mother   ? Cancer Father   ?  ?Social History  ? ?Socioeconomic History  ? Marital status: Single  ?  Spouse name: Not on file  ? Number of children: Not on file  ? Years of education: Not on file  ? Highest education level: Not on file  ?Occupational History  ? Not on file  ?Tobacco Use  ? Smoking status: Never  ? Smokeless tobacco: Never  ?Vaping Use  ? Vaping Use: Never used  ?Substance and Sexual Activity  ? Alcohol use: No  ? Drug use: No  ? Sexual activity: Not on file  ?Other Topics Concern  ? Not on file  ?Social History Narrative  ? Not on file  ? ?Social Determinants of Health  ? ?Financial Resource Strain: Not on file  ?Food Insecurity: Not on file  ?Transportation Needs: Not on file  ?Physical Activity: Not on file  ?Stress: Not on file  ?Social Connections: Not on file  ?Intimate Partner Violence: Not on file  ?  ? ?Physical Exam  ? ?Vitals:  ? 04/14/21 2247  ?BP: (!) 151/96  ?Pulse: (!) 119  ?Resp: 18  ?Temp: 98.4 ?F (36.9 ?C)  ?SpO2: 97%  ?  ?CONSTITUTIONAL:  Chronically ill-appearing, NAD ?NEURO/PSYCH:  Alert and oriented x 3, no focal deficits ?EYES:  eyes equal and reactive ?ENT/NECK:  no LAD, no JVD ?CARDIO: Regular rate, well-perfused, normal S1 and S2 ?PULM:  CTAB no wheezing or rhonchi ?GI/GU:  non-distended, non-tender ?MSK/SPINE:  No gross deformities, 2+ pitting edema to bilateral lower extremities ?SKIN:  no rash, atraumatic ? ? ?*Additional and/or pertinent findings included in MDM below ? ?Diagnostic and Interventional Summary  ? ? EKG Interpretation ? ?Date/Time:  Sunday April 15 2021 00:17:28 EDT ?Ventricular Rate:  106 ?PR Interval:  160 ?QRS Duration: 91 ?QT Interval:  351 ?QTC Calculation: 467 ?R Axis:   -27 ?Text Interpretation: Sinus tachycardia Consider left atrial  enlargement Inferior infarct, old Probable anteroseptal infarct, old Baseline wander in lead(s) III V3 V5 Confirmed by Gerlene Fee [phone]) on 04/15/2021 1:08:40 AM ?  ? ?  ? ?Labs Reviewed  ?CBC - Abnormal; Notable for the following components:  ?    Result Value  ? RBC 3.90 (*)   ? Hemoglobin 12.1 (*)   ? HCT 35.5 (*)   ? All other components within normal limits  ?COMPREHENSIVE METABOLIC PANEL - Abnormal; Notable for the following components:  ? Glucose, Bld 379 (*)   ? BUN 24 (*)   ? Calcium 8.6 (*)   ? Albumin 3.4 (*)   ? All other components within normal limits  ?BRAIN NATRIURETIC PEPTIDE  ?  ?CT HEAD WO CONTRAST (5MM)  ?Final Result  ?  ?CT CERVICAL SPINE WO CONTRAST  ?Final Result  ?  ?DG Chest Port 1 View  ?Final Result  ?  ?DG Shoulder Right  ?Final Result  ?  ?DG Femur Min 2 Views Right  ?Final Result  ?  ?DG Hip Unilat W or Wo Pelvis 2-3 Views Right  ?Final Result  ?  ?  ?Medications  ?HYDROmorphone (DILAUDID) injection 1 mg (1 mg Intramuscular Given 04/15/21 0044)  ?  ? ?Procedures  /  Critical Care ?Procedures ? ?ED Course and Medical Decision Making  ?Initial Impression and Ddx ?Patient will need traumatic work-up for fall.  Having headache, neck pain, arm, leg, hip pain.  Differential diagnosis including cervical spinal fracture, intracranial bleeding, orthopedic injuries.  Patient also endorsing new fairly significant lower extremity edema however it appears chronic on exam.  Awaiting labs. ? ?Past medical/surgical history that increases complexity of ED encounter: Diabetes ? ?Interpretation of Diagnostics ?I personally reviewed the EKG and my interpretation is as follows: Sinus rhythm ?   ?Labs do not reveal any significant blood count or electrolyte disturbance CBC/CMP.  BNP is normal.  CT and x-ray imaging is without acute injury. ? ?Patient Reassessment and Ultimate Disposition/Management ?Patient is appropriate for discharge. ? ?Patient management required discussion with the following services  or consulting groups:  None ? ?Complexity of Problems Addressed ?Acute illness or injury that poses threat of life of bodily function ? ?Additional Data Reviewed and Analyzed ?Further history obtained from: ?None ? ?Additional Factors Impacting ED Encounter Risk ?None ? ?Barth Kirks. Sedonia Small, MD ?[facility] Emergency Medicine ?[place] ?[email or web address] ? ?Final Clinical Impressions(s) / ED Diagnoses  ? ?  ICD-10-CM   ?1. Fall, initial encounter  [email or web address] Abbe   ?  ?2. Injury of head, initial encounter  S09.90XA   ?  ?  ?ED Discharge Orders   ? ? None  ? ?  ?  ? ?Discharge Instructions Discussed with and Provided to Patient:  ? ? ? ?Discharge Instructions   ? ?  ?  You were evaluated in the Emergency Department and after careful evaluation, we did not find any emergent condition requiring admission or further testing in the hospital. ? ?Your exam/testing today was overall reassuring. ? ?Please return to the Emergency Department if you experience any worsening of your condition.  Thank you for allowing [place] to be a part of your care. ? ? ? ? ? ?  ?Maudie Flakes, MD ?04/15/21 0121 ? ?

## 2021-04-15 DIAGNOSIS — M19011 Primary osteoarthritis, right shoulder: Secondary | ICD-10-CM | POA: Diagnosis not present

## 2021-04-15 DIAGNOSIS — M2578 Osteophyte, vertebrae: Secondary | ICD-10-CM | POA: Diagnosis not present

## 2021-04-15 DIAGNOSIS — R600 Localized edema: Secondary | ICD-10-CM | POA: Diagnosis not present

## 2021-04-15 DIAGNOSIS — S0990XA Unspecified injury of head, initial encounter: Secondary | ICD-10-CM | POA: Diagnosis not present

## 2021-04-15 LAB — COMPREHENSIVE METABOLIC PANEL
ALT: 14 U/L (ref 0–44)
AST: 16 U/L (ref 15–41)
Albumin: 3.4 g/dL — ABNORMAL LOW (ref 3.5–5.0)
Alkaline Phosphatase: 79 U/L (ref 38–126)
Anion gap: 9 (ref 5–15)
BUN: 24 mg/dL — ABNORMAL HIGH (ref 8–23)
CO2: 27 mmol/L (ref 22–32)
Calcium: 8.6 mg/dL — ABNORMAL LOW (ref 8.9–10.3)
Chloride: 99 mmol/L (ref 98–111)
Creatinine, Ser: 1.02 mg/dL (ref 0.61–1.24)
GFR, Estimated: >60 mL/min (ref >60)
Glucose, Bld: 379 mg/dL — ABNORMAL HIGH (ref 70–99)
Potassium: 3.7 mmol/L (ref 3.5–5.1)
Sodium: 135 mmol/L (ref 135–145)
Total Bilirubin: 0.5 mg/dL (ref 0.3–1.2)
Total Protein: 7.1 g/dL (ref 6.5–8.1)

## 2021-04-15 LAB — CBC
HCT: 35.5 % — ABNORMAL LOW (ref 39.0–52.0)
Hemoglobin: 12.1 g/dL — ABNORMAL LOW (ref 13.0–17.0)
MCH: 31 pg (ref 26.0–34.0)
MCHC: 34.1 g/dL (ref 30.0–36.0)
MCV: 91 fL (ref 80.0–100.0)
Platelets: 170 10*3/uL (ref 150–400)
RBC: 3.9 MIL/uL — ABNORMAL LOW (ref 4.22–5.81)
RDW: 14 % (ref 11.5–15.5)
WBC: 5.9 10*3/uL (ref 4.0–10.5)
nRBC: 0 % (ref 0.0–0.2)

## 2021-04-15 LAB — BRAIN NATRIURETIC PEPTIDE: B Natriuretic Peptide: 11 pg/mL (ref 0.0–100.0)

## 2021-04-15 MED ORDER — HYDROMORPHONE HCL 1 MG/ML IJ SOLN
1.0000 mg | Freq: Once | INTRAMUSCULAR | Status: AC
  Administered 2021-04-15: 1 mg via INTRAMUSCULAR
  Filled 2021-04-15: qty 1

## 2021-04-15 NOTE — Discharge Instructions (Signed)
You were evaluated in the Emergency Department and after careful evaluation, we did not find any emergent condition requiring admission or further testing in the hospital.  Your exam/testing today was overall reassuring.  Please return to the Emergency Department if you experience any worsening of your condition.  Thank you for allowing us to be a part of your care.  

## 2021-07-31 ENCOUNTER — Other Ambulatory Visit: Payer: Self-pay | Admitting: Orthopedic Surgery

## 2021-09-14 DEATH — deceased

## 2022-09-06 IMAGING — DX DG HIP (WITH OR WITHOUT PELVIS) 2-3V*R*
3 series · 3 of 3 positions shown · non-contrast
Comparison: None.

CLINICAL DATA: Multiple falls, right hip pain

EXAM:
DG HIP (WITH OR WITHOUT PELVIS) 2-3V RIGHT

[pelvis ap]
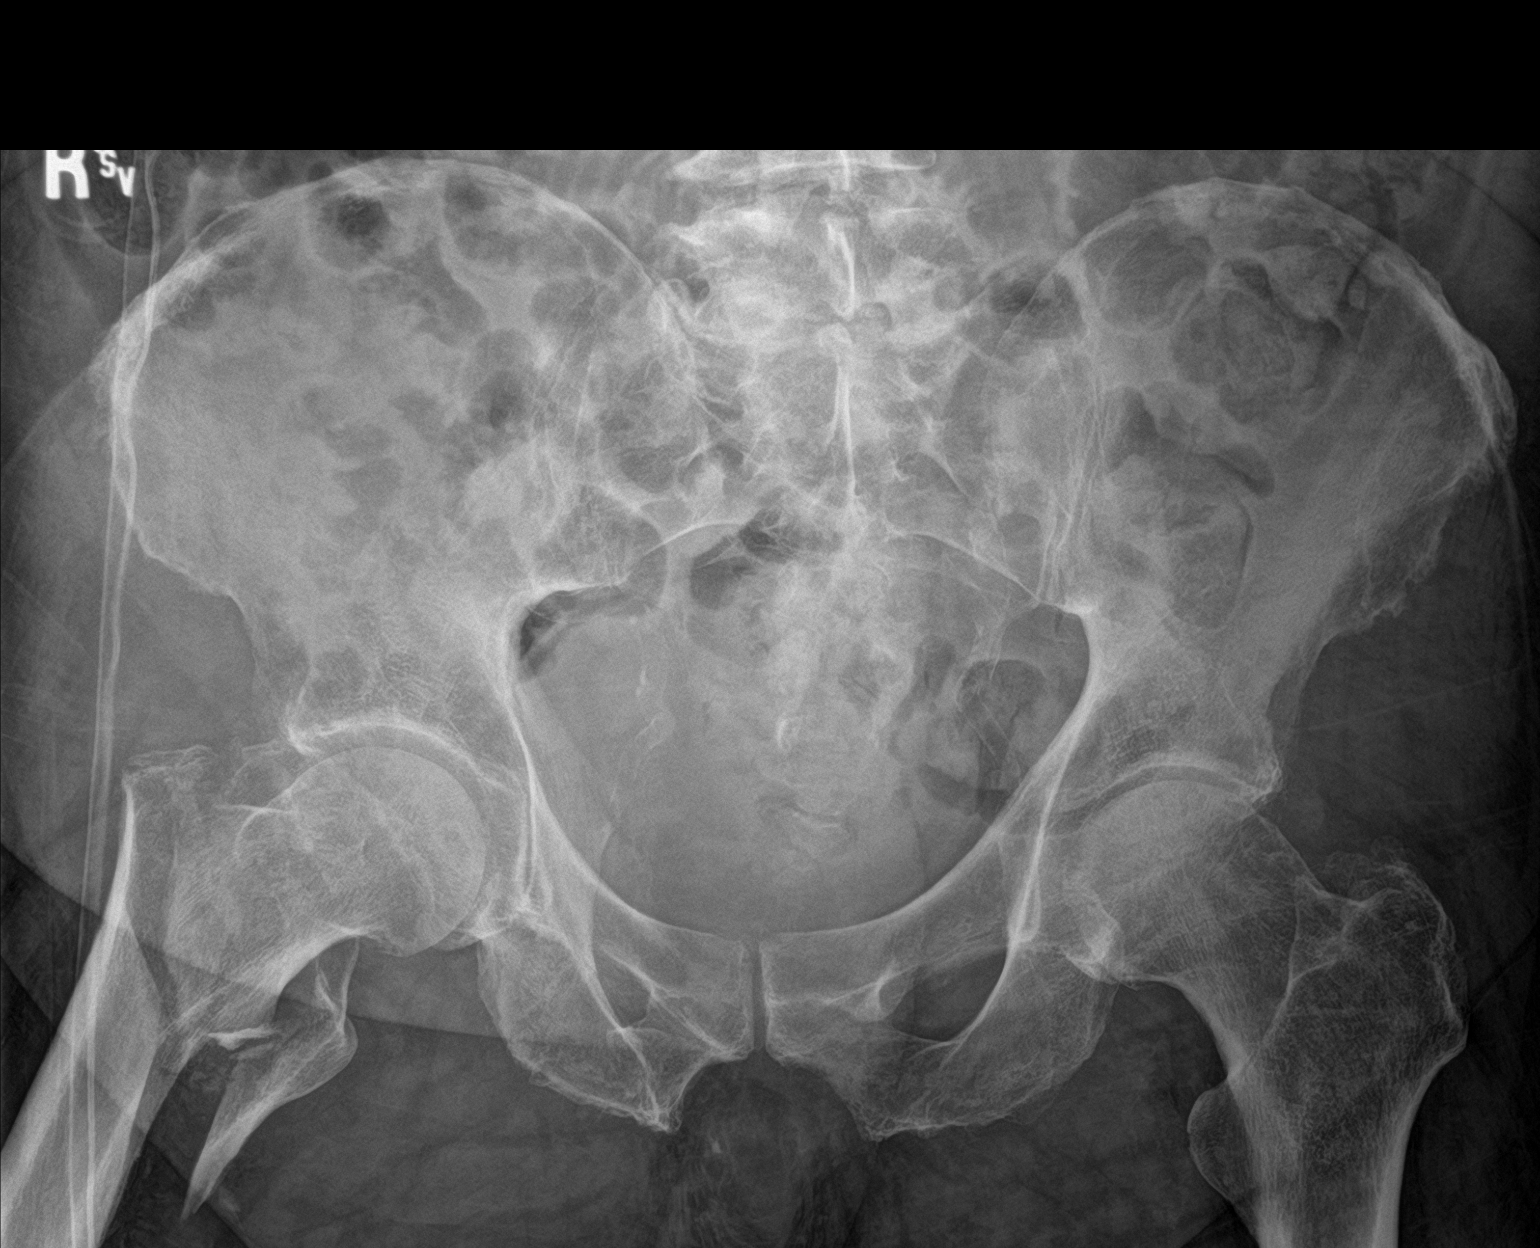

[hip lat]
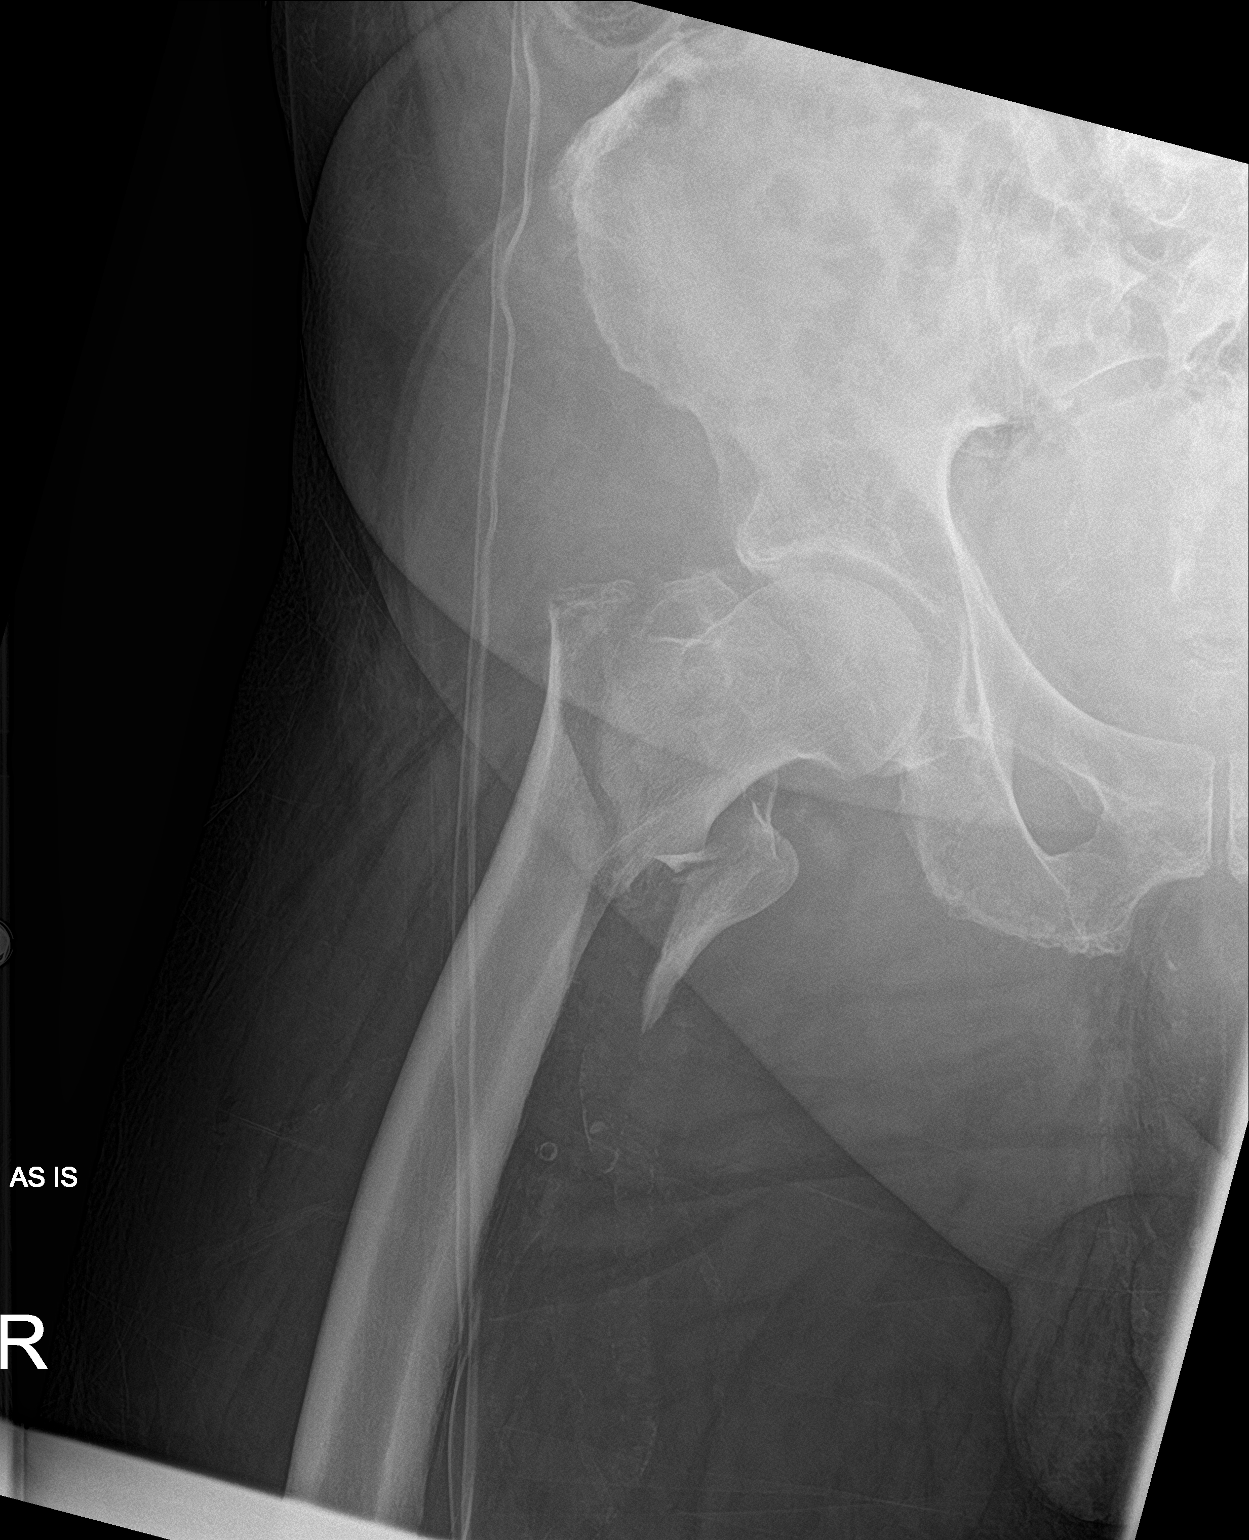

[hip lat xtable right]
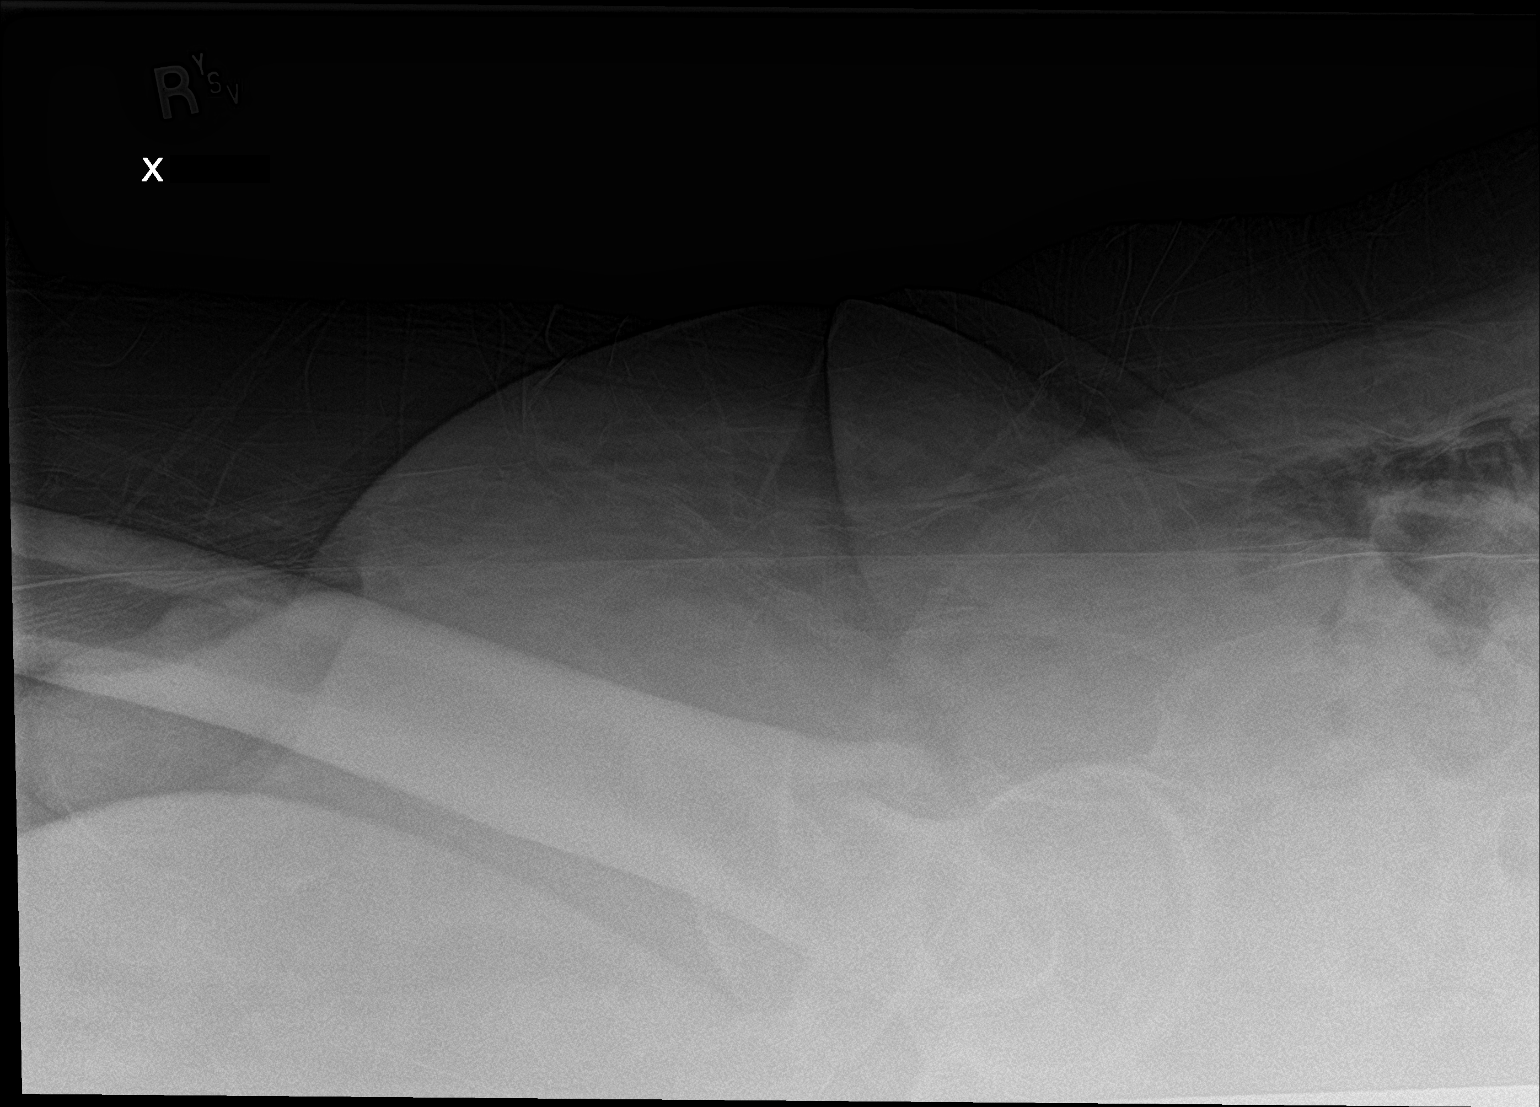

[3 of 3 positions shown; findings below may reference images not displayed]

FINDINGS: Two view radiograph of the right hip and single view radiograph the
pelvis demonstrates a acute, intratrochanteric fracture of the right
hip with varus angulation and mild distraction of the distal
fracture fragment. There is avulsion of the lesser trochanter. The
femoral head is still seated within the right acetabulum and the
joint space is preserved. Mild left hip degenerative arthritis.
IMPRESSION: Acute, angulated intratrochanteric right hip fracture with avulsion
of the lesser trochanter.
